# Patient Record
Sex: Male | Born: 1960 | Race: Black or African American | Hispanic: No | Marital: Single | Smoking: Current every day smoker
Health system: Southern US, Community
[De-identification: ages and names within clinical notes are randomized; demographics above are authoritative.]

## PROBLEM LIST (undated history)

## (undated) DIAGNOSIS — R531 Weakness: Secondary | ICD-10-CM

## (undated) DIAGNOSIS — G8929 Other chronic pain: Secondary | ICD-10-CM

## (undated) DIAGNOSIS — IMO0001 Reserved for inherently not codable concepts without codable children: Secondary | ICD-10-CM

## (undated) DIAGNOSIS — R51 Headache: Secondary | ICD-10-CM

## (undated) DIAGNOSIS — R42 Dizziness and giddiness: Secondary | ICD-10-CM

## (undated) DIAGNOSIS — Z86718 Personal history of other venous thrombosis and embolism: Secondary | ICD-10-CM

## (undated) DIAGNOSIS — G4730 Sleep apnea, unspecified: Secondary | ICD-10-CM

## (undated) DIAGNOSIS — F419 Anxiety disorder, unspecified: Secondary | ICD-10-CM

## (undated) DIAGNOSIS — M62838 Other muscle spasm: Secondary | ICD-10-CM

## (undated) DIAGNOSIS — F1910 Other psychoactive substance abuse, uncomplicated: Secondary | ICD-10-CM

## (undated) DIAGNOSIS — R519 Headache, unspecified: Secondary | ICD-10-CM

## (undated) DIAGNOSIS — F329 Major depressive disorder, single episode, unspecified: Secondary | ICD-10-CM

## (undated) DIAGNOSIS — F32A Depression, unspecified: Secondary | ICD-10-CM

## (undated) DIAGNOSIS — M549 Dorsalgia, unspecified: Secondary | ICD-10-CM

## (undated) DIAGNOSIS — I10 Essential (primary) hypertension: Secondary | ICD-10-CM

## (undated) DIAGNOSIS — J449 Chronic obstructive pulmonary disease, unspecified: Secondary | ICD-10-CM

## (undated) DIAGNOSIS — E785 Hyperlipidemia, unspecified: Secondary | ICD-10-CM

## (undated) DIAGNOSIS — K5900 Constipation, unspecified: Secondary | ICD-10-CM

## (undated) DIAGNOSIS — I509 Heart failure, unspecified: Secondary | ICD-10-CM

## (undated) HISTORY — DX: Anxiety disorder, unspecified: F41.9

## (undated) HISTORY — PX: OTHER SURGICAL HISTORY: SHX169

## (undated) HISTORY — DX: Other psychoactive substance abuse, uncomplicated: F19.10

## (undated) HISTORY — DX: Personal history of other venous thrombosis and embolism: Z86.718

## (undated) NOTE — *Deleted (*Deleted)
***In Progress*** PCP: [facility]   HPI:   Mr Riendeau is a 34 yo male with systolic heart failure diagnosed in 04/2012 in [place] secondary to NICM (normal cors on cath), EF 20-25%. As well as polysubstance abuse (cocaine, tobacco and alcohol). Dr. Jones Broom saw him in the HF clinic in 2016 prior to clinic visit last month. He was referred back by Judy Pimple NP for further evaluation of his HF.   He moved from [facility] and was admitted to [facility] in 4/14 with progressive dyspnea and orthopnea.  Echo on admit EF 20-25% with normal RV.  Discharge weight 147 pounds.  He also had NSVT while in house and continued his Lifevest placed in [place].  Admitted To [facility] in 8/14 with HF. EF reported 50-55% on echo. On 10/2012 bedside echo in our HF Clinic EF 35-40%. In 11/2012, presented to clinic complaining of recurrent CP and severe HF symptoms but seemed well compensated on exam. R/L cath showed normal cors.   Readmitted to [facility] 9/14-17/2021 with severe HTN and recurrent CP anfd HF. Reported he was taking his HF meds except his fluid pill. BP on arrival 163/118. Diuresed and BP controlled.   UDS + for cocaine, THC and opiates. Echo 09/18/19 EF 20-25% RV mild HK   Patient returned to clinic with Dr. Jones Broom 09/26/19 for post hospital follow up and to enroll back in HF Clinic (previously discharged due to inappropriate behavior towards staff). At appointment, he felt pretty good. Was living in a rental house by himself. SOB with mild activity. No edema, orthopnea or PND. Reported taking all meds except carvedilol because he threw it out by accident. Using cocaine 2-3 weeks. Reported drinking 12 pack every 2 days.   Today he returns to HF clinic for pharmacist medication titration. At last visit with Dr. Jones Broom on 09/26/19, carvedilol was restarted at dose of 6.25 mg BID and spironolactone 12.5 mg daily was initiated.    HF Medications: Carvedilol 6.25 mg BID  Losartan 100 mg daily  Spironolactone  12.5 mg daily  Isosorbide 60 mg daily  Furosemide 40 mg daily  Has the patient been experiencing any side effects to the medications prescribed?  {YES NO:22349}  Does the patient have any problems obtaining medications due to transportation or finances?   {YES NO:22349}NCMED  Understanding of regimen: {excellent/good/fair/poor:19665} Understanding of indications: {excellent/good/fair/poor:19665} Potential of compliance: {excellent/good/fair/poor:19665} Patient understands to avoid NSAIDs. Patient understands to avoid decongestants.    Pertinent Lab Values: . Serum creatinine 1.21, BUN 19, Potassium 4.3, Sodium 138, BNP 331.4   Vital Signs: . Weight: *** (last clinic weight: 150 lbs) . Blood pressure: ***  . Heart rate: ***   Assessment: 1) Chronic systolic HF:  - NICM, ECHO 02/2013 EF ~35-40% - NYHA II symptoms.  - Cath 11/14 normal cors - Echo 09/18/19 EF 20-25% RV mildly HK in setting of severe HTN - Volume status ok - Continue carvedilol 6.25 mg BID - Continue losartan 100 daily (no Entresto due to angiodema with ACEi) - Continue spironolactone 12.5 mg daily  2) Polysubstance abuse - Previously was attending AA meetings and group sessions weekly.  3) Current smoker - Encouraged cessation   4) HTN*** - Markedly elevated recently - Continue spironolactone 12.5 mg daily  5) OSA- Per Dr. Vassie Loll, not using CPAP. Dr. Jones Broom encouraged to use nightly.   6) Clinic behavior - Previously d/c'd from HF Clinic as several staff members did not feel comfortable in his presence. Dr. Jones Broom told patient that he  would get one more chance with a very low tolerance. If he is disrespectful to any staff members will be dismissed from clinic on the spot.    Plan: 1) Medication changes: Based on clinical presentation, vital signs and recent labs will *** 2) Labs: *** 3) Follow-up: ***   Karle Plumber, PharmD, BCPS, BCCP, CPP Heart Failure Clinic Pharmacist [phone]

## (undated) NOTE — *Deleted (*Deleted)
***In Progress***  [facility]: Dr. Gala Romney  HPI:  Charles Cervantes is a 27 yo male with systolic heart failure diagnosed in 04/2012 in [place] secondary to NICM (normal cors on cath) with EF 20-25%, as well as polysubstance abuse (cocaine, tobacco and alcohol). Dr. Gala Romney last saw him in the HF Clinic in 2016, and he was now referred back by Judy Pimple, NP for further evaluation of his HF.   He moved from [facility] and was admitted to [facility] in 04/2012 with progressive dyspnea and orthopnea.  Echo on admit with EF 20-25% with normal RV.  Discharge weight was 147 pounds.  He also had NSVT while in-house and we continued his Lifevest that was placed in [place].  Admitted To [facility] in 08/2012 with HF. EF reported 50-55% on echo. On 10/2012, bedside echo in our HF Clinic with EF 35-40%. In 11/2012, he presented to clinic with concerns of recurrent CP and severe HF symptoms but seemed well compensated on exam. R/L cath showed normal cors.   Readmitted to [facility] 09/17/19-09/20/19 with severe HTN and recurrent CP and HF. Reported he was taking his HF meds except his furosemide. BP on arrival was 163/118. Diuresed and BP controlled.   UDS + for cocaine, THC and opiates. Echo on 09/18/19 with EF 20-25%, RV mildly hypokinetic.  Most recently presented for post-hospital follow-up with Dr. Gala Romney on 09/26/19 to enroll back in HF Clinic (previously discharged due to inappropriate behavior towards staff). He stated that he felt pretty good, SOB with mild activity. Lives in a rental house by himself. Did not have edema, orthopnea or PND. York Spaniel that he is taking all medications except carvedilol because he threw it out by accident. Was using cocaine every 2-3 weeks and drinking a 12-pack every 2 days.   Today he returns to HF clinic for pharmacist medication titration. At last visit with MD, carvedilol 6.25 mg BID was resumed and spironolactone 12.5 mg daily was started.   Overall feeling ***. Dizziness, lightheadedness, fatigue:   Chest pain or palpitations.  How is your breathing?: *** SOB Able to complete all ADLs. Activity level ***  Weight at home pounds. Takes furosemide 40 mg daily.  PND/Orthopnea:   Appetite ***.  Marland Kitchen Shortness of breath/dyspnea on exertion? {YES [id number]  . Orthopnea/PND? {YES [id number] . Edema? {YES [id number] . Lightheadedness/dizziness? {YES [id number] . Daily weights at home? {YES [id number] . Blood pressure/heart rate monitoring at home? {YES [id number] . Following low-sodium/fluid-restricted diet? {YES NO:22349}  HF Medications: Carvedilol 6.25 mg BID Losartan 100 mg daily Spironolactone 12.5 mg daily Isosorbide mononitrate 60 mg daily Furosemide 40 mg daily  Has the patient been experiencing any side effects to the medications prescribed?  {YES NO:22349}  Does the patient have any problems obtaining medications due to transportation or finances?   {YES NO:22349}  Understanding of regimen: {excellent/good/fair/poor:19665} Understanding of indications: {excellent/good/fair/poor:19665} Potential of compliance: {excellent/good/fair/poor:19665} Patient understands to avoid NSAIDs. Patient understands to avoid decongestants.    Pertinent Lab Values: . Serum creatinine ***, BUN ***, Potassium ***, Sodium ***, BNP ***, Magnesium ***, Digoxin ***   Vital Signs: . Weight: *** (last clinic weight: 68.1 kg) . Blood pressure: ***  . Heart rate: ***   Assessment: 1) Chronic systolic HF: NICM, ECHO on 02/2013 with EF ~35-40%  - NYHA II symptoms, volume status ok - Continue furosemide 40 mg daily - Continue*** carvedilol 6.25*** mg BID  - Continue losartan 100 daily (no Entresto due to angiodema with ACEi) - Continue*** spironolactone 12.5*** mg daily -  Start hydralazine 25 mg TID*** - Stressed need for compliance with medications and abstinence from drugs and ETOH  2) Polysubstance abuse: Previously was attending AA meetings and group sessions weekly. - Stressed need for  compliance with meds and abstinence from drugs and ETOH  3) Current smoker - Encouraged cessation   4) HTN - Markedly elevated recently - Continue*** spironolactone 12.5*** mg daily  5) OSA- Per DrAlva. Not using CPAP. Encouraged to use nightly.   6) Clinic behavior - Previously d/c'd from HF Clinic as several staff members did not feel comfortable in his presence. Previously discussed with him that he would get one more chance with a very low tolerance. If he is disrespectful to any staff members, he will be dismissed from clinic on the spot.    Plan: 1) Medication changes: Based on clinical presentation, vital signs and recent labs will *** 2) Labs: *** 3) Follow-up: ***   Karle Plumber, PharmD, BCPS, BCCP, CPP Heart Failure Clinic Pharmacist [phone]

---

## 2010-01-03 DIAGNOSIS — Z86718 Personal history of other venous thrombosis and embolism: Secondary | ICD-10-CM

## 2010-01-03 HISTORY — DX: Personal history of other venous thrombosis and embolism: Z86.718

## 2012-05-01 ENCOUNTER — Inpatient Hospital Stay (HOSPITAL_COMMUNITY)
Admission: EM | Admit: 2012-05-01 | Discharge: 2012-05-03 | DRG: 293 | Disposition: A | Discharge status: Home / Self Care | Payer: Medicaid Other | Attending: Family Medicine | Admitting: Family Medicine

## 2012-05-01 ENCOUNTER — Emergency Department (HOSPITAL_COMMUNITY): Payer: Medicaid Other

## 2012-05-01 ENCOUNTER — Encounter (HOSPITAL_COMMUNITY): Payer: Self-pay | Admitting: Emergency Medicine

## 2012-05-01 DIAGNOSIS — Z9119 Patient's noncompliance with other medical treatment and regimen: Secondary | ICD-10-CM

## 2012-05-01 DIAGNOSIS — F1410 Cocaine abuse, uncomplicated: Secondary | ICD-10-CM | POA: Diagnosis present

## 2012-05-01 DIAGNOSIS — F17200 Nicotine dependence, unspecified, uncomplicated: Secondary | ICD-10-CM | POA: Diagnosis present

## 2012-05-01 DIAGNOSIS — I509 Heart failure, unspecified: Secondary | ICD-10-CM | POA: Diagnosis present

## 2012-05-01 DIAGNOSIS — Z91199 Patient's noncompliance with other medical treatment and regimen due to unspecified reason: Secondary | ICD-10-CM

## 2012-05-01 DIAGNOSIS — I5021 Acute systolic (congestive) heart failure: Principal | ICD-10-CM | POA: Diagnosis present

## 2012-05-01 DIAGNOSIS — I10 Essential (primary) hypertension: Secondary | ICD-10-CM | POA: Diagnosis present

## 2012-05-01 DIAGNOSIS — F1010 Alcohol abuse, uncomplicated: Secondary | ICD-10-CM | POA: Diagnosis present

## 2012-05-01 DIAGNOSIS — E785 Hyperlipidemia, unspecified: Secondary | ICD-10-CM | POA: Diagnosis present

## 2012-05-01 HISTORY — DX: Essential (primary) hypertension: I10

## 2012-05-01 HISTORY — DX: Hyperlipidemia, unspecified: E78.5

## 2012-05-01 LAB — CBC WITH DIFFERENTIAL/PLATELET
Lymphocytes Relative: 17 % (ref 12–46)
Lymphs Abs: 1.2 10*3/uL (ref 0.7–4.0)
MCV: 95.3 fL (ref 78.0–100.0)
Neutro Abs: 4.4 10*3/uL (ref 1.7–7.7)
Neutrophils Relative %: 64 % (ref 43–77)
Platelets: 194 10*3/uL (ref 150–400)
RBC: 4.26 MIL/uL (ref 4.22–5.81)
WBC: 6.9 10*3/uL (ref 4.0–10.5)

## 2012-05-01 LAB — TROPONIN I: Troponin I: <0.3 ng/mL (ref <0.30)

## 2012-05-01 LAB — COMPREHENSIVE METABOLIC PANEL
ALT: 45 U/L (ref 0–53)
AST: 31 U/L (ref 0–37)
Alkaline Phosphatase: 46 U/L (ref 39–117)
CO2: 25 mEq/L (ref 19–32)
Chloride: 102 mEq/L (ref 96–112)
GFR calc Af Amer: >90 mL/min (ref >90)
GFR calc non Af Amer: >90 mL/min (ref >90)
Glucose, Bld: 106 mg/dL — ABNORMAL HIGH (ref 70–99)
Potassium: 4.2 mEq/L (ref 3.5–5.1)
Sodium: 135 mEq/L (ref 135–145)

## 2012-05-01 LAB — RAPID URINE DRUG SCREEN, HOSP PERFORMED: Barbiturates: NOT DETECTED

## 2012-05-01 LAB — POCT I-STAT TROPONIN I

## 2012-05-01 LAB — PRO B NATRIURETIC PEPTIDE: Pro B Natriuretic peptide (BNP): 2656 pg/mL — ABNORMAL HIGH (ref 0–125)

## 2012-05-01 MED ORDER — SODIUM CHLORIDE 0.9 % IJ SOLN
3.0000 mL | Freq: Two times a day (BID) | INTRAMUSCULAR | Status: DC
  Administered 2012-05-01 – 2012-05-03 (×4): 3 mL via INTRAVENOUS

## 2012-05-01 MED ORDER — MENTHOL 3 MG MT LOZG
1.0000 | LOZENGE | OROMUCOSAL | Status: DC | PRN
  Administered 2012-05-01 – 2012-05-03 (×4): 3 mg via ORAL
  Filled 2012-05-01 (×2): qty 9

## 2012-05-01 MED ORDER — ACETAMINOPHEN 325 MG PO TABS: 650.0000 mg | ORAL_TABLET | ORAL | Status: DC | PRN

## 2012-05-01 MED ORDER — FUROSEMIDE 10 MG/ML IJ SOLN
40.0000 mg | Freq: Two times a day (BID) | INTRAMUSCULAR | Status: DC
  Administered 2012-05-01 – 2012-05-03 (×4): 40 mg via INTRAVENOUS
  Filled 2012-05-01 (×6): qty 4

## 2012-05-01 MED ORDER — VITAMIN B-1 100 MG PO TABS
100.0000 mg | ORAL_TABLET | Freq: Every day | ORAL | Status: DC
  Administered 2012-05-02 – 2012-05-03 (×2): 100 mg via ORAL
  Filled 2012-05-01 (×2): qty 1

## 2012-05-01 MED ORDER — LORAZEPAM 2 MG/ML IJ SOLN: 1.0000 mg | Freq: Four times a day (QID) | INTRAMUSCULAR | Status: DC | PRN

## 2012-05-01 MED ORDER — LORAZEPAM 2 MG/ML IJ SOLN
1.0000 mg | Freq: Once | INTRAMUSCULAR | Status: AC
  Administered 2012-05-01: 1 mg via INTRAVENOUS
  Filled 2012-05-01: qty 1

## 2012-05-01 MED ORDER — ACETAMINOPHEN 325 MG PO TABS
650.0000 mg | ORAL_TABLET | ORAL | Status: DC | PRN
  Administered 2012-05-01 – 2012-05-03 (×6): 650 mg via ORAL
  Filled 2012-05-01 (×6): qty 2

## 2012-05-01 MED ORDER — ZOLPIDEM TARTRATE 5 MG PO TABS: 5.0000 mg | ORAL_TABLET | Freq: Every evening | ORAL | Status: DC | PRN

## 2012-05-01 MED ORDER — LORAZEPAM 0.5 MG PO TABS
1.0000 mg | ORAL_TABLET | Freq: Four times a day (QID) | ORAL | Status: DC | PRN
  Administered 2012-05-02: 1 mg via ORAL
  Filled 2012-05-01: qty 2

## 2012-05-01 MED ORDER — SODIUM CHLORIDE 0.9 % IV SOLN: 250.0000 mL | INTRAVENOUS | Status: DC | PRN

## 2012-05-01 MED ORDER — LORAZEPAM 0.5 MG PO TABS
0.0000 mg | ORAL_TABLET | Freq: Four times a day (QID) | ORAL | Status: DC
  Administered 2012-05-01 – 2012-05-02 (×2): 2 mg via ORAL
  Filled 2012-05-01 (×2): qty 4

## 2012-05-01 MED ORDER — LISINOPRIL 5 MG PO TABS
5.0000 mg | ORAL_TABLET | Freq: Every day | ORAL | Status: DC
  Administered 2012-05-01: 5 mg via ORAL
  Filled 2012-05-01 (×2): qty 1

## 2012-05-01 MED ORDER — ADULT MULTIVITAMIN W/MINERALS CH
1.0000 | ORAL_TABLET | Freq: Every day | ORAL | Status: DC
  Administered 2012-05-02 – 2012-05-03 (×2): 1 via ORAL
  Filled 2012-05-01 (×2): qty 1

## 2012-05-01 MED ORDER — ATORVASTATIN CALCIUM 10 MG PO TABS
10.0000 mg | ORAL_TABLET | Freq: Every day | ORAL | Status: DC
  Administered 2012-05-01 – 2012-05-02 (×2): 10 mg via ORAL
  Filled 2012-05-01 (×5): qty 1

## 2012-05-01 MED ORDER — FUROSEMIDE 10 MG/ML IJ SOLN
40.0000 mg | Freq: Once | INTRAMUSCULAR | Status: AC
  Administered 2012-05-01: 40 mg via INTRAVENOUS
  Filled 2012-05-01: qty 4

## 2012-05-01 MED ORDER — THIAMINE HCL 100 MG/ML IJ SOLN
100.0000 mg | Freq: Every day | INTRAMUSCULAR | Status: DC
  Filled 2012-05-01 (×2): qty 1

## 2012-05-01 MED ORDER — FOLIC ACID 1 MG PO TABS
1.0000 mg | ORAL_TABLET | Freq: Every day | ORAL | Status: DC
  Administered 2012-05-02 – 2012-05-03 (×2): 1 mg via ORAL
  Filled 2012-05-01 (×2): qty 1

## 2012-05-01 MED ORDER — ASPIRIN EC 81 MG PO TBEC
81.0000 mg | DELAYED_RELEASE_TABLET | Freq: Every day | ORAL | Status: DC
  Administered 2012-05-01 – 2012-05-03 (×3): 81 mg via ORAL
  Filled 2012-05-01 (×3): qty 1

## 2012-05-01 MED ORDER — SODIUM CHLORIDE 0.9 % IV SOLN: INTRAVENOUS | Status: AC

## 2012-05-01 MED ORDER — LORAZEPAM 0.5 MG PO TABS: 0.0000 mg | ORAL_TABLET | Freq: Two times a day (BID) | ORAL | Status: DC

## 2012-05-01 MED ORDER — HEPARIN SODIUM (PORCINE) 5000 UNIT/ML IJ SOLN
5000.0000 [IU] | Freq: Three times a day (TID) | INTRAMUSCULAR | Status: DC
  Administered 2012-05-01 – 2012-05-03 (×6): 5000 [IU] via SUBCUTANEOUS
  Filled 2012-05-01 (×10): qty 1

## 2012-05-01 MED ORDER — SODIUM CHLORIDE 0.9 % IJ SOLN
3.0000 mL | INTRAMUSCULAR | Status: DC | PRN
  Administered 2012-05-01: 3 mL via INTRAVENOUS

## 2012-05-01 MED ORDER — ONDANSETRON HCL 4 MG/2ML IJ SOLN: 4.0000 mg | Freq: Four times a day (QID) | INTRAMUSCULAR | Status: DC | PRN

## 2012-05-01 MED ORDER — FUROSEMIDE 10 MG/ML IJ SOLN
40.0000 mg | Freq: Every day | INTRAMUSCULAR | Status: DC
  Administered 2012-05-01: 40 mg via INTRAVENOUS
  Filled 2012-05-01: qty 4

## 2012-05-01 MED ORDER — NICOTINE 21 MG/24HR TD PT24
21.0000 mg | MEDICATED_PATCH | Freq: Every day | TRANSDERMAL | Status: DC
  Administered 2012-05-01 – 2012-05-03 (×3): 21 mg via TRANSDERMAL
  Filled 2012-05-01 (×3): qty 1

## 2012-05-01 NOTE — ED Notes (Signed)
Report called Cephus Slater RN

## 2012-05-01 NOTE — ED Notes (Signed)
[id number]<AV> Expected date:<BR> Expected time:<BR> Means of arrival:<BR> Comments:<BR> triage

## 2012-05-01 NOTE — ED Notes (Signed)
Pt presenting to ed with c/o chest pain with shortness of breath x 1 week pt states he was discharge for the same thing x 1 week ago and his symptoms continue pt denies nausea and vomiting at this time

## 2012-05-01 NOTE — H&P (Addendum)
[facility] History and Physical  Charles Cervantes AVW:[id number] DOB: [birth year] DOA: 05/01/2012  Referring physician: Dr. Adriana Cervantes PCP: No primary provider on file.  Specialists: heart failure team  Chief Complaint: shortness of breath  HPI: Charles Cervantes is a 52 y.o. male without known past medical history, who presented to the ED with a chief complaint of shortness of breath and chest pain worse with exertion for the past week. He has a history of heavy alcohol and drug abusing, including cocaine. Last week he was visiting a friend in [place] when he had chest pain and breathing difficulties. He was hospitalized at a local hospital, and he was told he has "weak heart". He underwent cardiac catheterization - per patient report - last Wednesday, and the he was explained that they are looking for blockage that they could fix. After catheterization patient was told that there was no blockage and his heart problems are likely due to to his alcohol or drug use. His ejection fraction is unknown at this time, however patient is wearing a life vest in the ED. He was told that if he gets palpitations he might get a shock. Patient denies any shocks. He came back to [place] about 3 days ago, and since then, his symptoms have been slowly getting worse. Has noticed his legs being swollen, his cough got worse, and he has sputum production with clear frothy aspect. He is unable to lay flat and has been sitting in the chair recliner for the past 3 days. He thinks he gained some weight, however he does not weigh himself. He denies any nausea vomiting or diarrhea. He denies any lightheadedness or dizziness. He denies any fevers or chills. In [place], he was started on Augmentin, but he's not sure why he was started on that medication. He thinks it is for bronchitis. On discharge, he was told to come back to [place] and establish a local cardiologist because his heart condition is serious. He currently has symptoms  at rest. In the emergency room, he received 40 mg of IV Lasix. He told me he quit drinking and quit his other drugs when he was admitted to [facility].  Review of Systems:  Constitutional: Negative for malaise, fever and chills. No significant weight loss or weight gain  Eyes: Negative for eye pain, redness and discharge, diplopia, visual changes, or flashes of light.  HENT: Negative for ear pain, hoarseness, nasal congestion, sinus pressure and sore throat. No headaches; tinnitus, drooling, or problem swallowing.  Cardiovascular: As per history of present illness Respiratory: As per history of present illness Gastrointestinal: Negative for nausea, diarrhea, constipation, abdominal pain, melena, blood in stool, hematemesis, jaundice and rectal bleeding.  Genitourinary: Negative for frequency, dysuria, incontinence, flank pain and hematuria  Musculoskeletal: Negative for back pain and neck pain. 2+ pitting edema right lower extremity more than left lower extremity. Skin: . Negative for pruritus, rash, abrasions, bruising and skin lesion. ulcerations  Neuro: Negative for headache, and neck stiffness. Negative for weakness, altered level of consciousness , altered mental status, extremity weakness, seizure and syncope.  Psych: negative for anxiety or depression  Past Medical History  Diagnosis Date  . Hypertension   . Hyperlipidemia    Past Surgical History  Procedure Laterality Date  . Left second finger surgery     Social History:  reports that he has been smoking Cigarettes.  He has been smoking about 0.50 packs per day. He has never used smokeless tobacco. He reports that  drinks alcohol.  He reports that he uses illicit drugs (Cocaine).  No Known Allergies  History reviewed. No pertinent family history.   Prior to Admission medications   Medication Sig Start Date End Date Taking? Authorizing Provider  amoxicillin-clavulanate (AUGMENTIN) 875-125 MG per tablet Take 1 tablet by  mouth 2 (two) times daily. For 7 days. Started on 04-28-12   Yes Historical Provider, MD  lisinopril (PRINIVIL,ZESTRIL) 5 MG tablet Take 5 mg by mouth daily.   Yes Historical Provider, MD  PRESCRIPTION MEDICATION Pt was on furosemide injection in the hospital. Pt states he was supposed to continue this medication after discharge, however pt could not get this filled and has contacted the md to change this to by mouth form   Yes Historical Provider, MD  spironolactone (ALDACTONE) 25 MG tablet Take 25 mg by mouth daily.   Yes Historical Provider, MD   Physical Exam: Filed Vitals:   05/01/12 0834  BP: 125/97  Pulse: 74  Temp: 97.9 F (36.6 C)  TempSrc: Oral  Resp: 20  SpO2: 100%     General:  No apparent distress, sitting at the edge of the bed   Eyes: PERRL, EOMI, no scleral icterus  ENT: moist oropharynx  Neck: supple, JVD up to mandible  Cardiovascular: regular rate without MRG; 2+ peripheral pulses  Respiratory: good air movement bilaterally, crackled present to mid fields. No wheezing.  Abdomen: soft, non tender to palpation, positive bowel sounds, no guarding, no rebound  Skin: no rashes  Musculoskeletal: 2+ pittint peripheral edema, right greater than left  Psychiatric: normal mood and affect  Neurologic: CN 2-12 grossly intact, MS 5/5 in all 4  Labs on Admission:  Basic Metabolic Panel:  Recent Labs Lab 05/01/12 0934  NA 135  K 4.2  CL 102  CO2 25  GLUCOSE 106*  BUN 11  CREATININE 0.85  CALCIUM 8.5   Liver Function Tests:  Recent Labs Lab 05/01/12 0934  AST 31  ALT 45  ALKPHOS 46  BILITOT 0.3  PROT 5.8*  ALBUMIN 3.1*   CBC:  Recent Labs Lab 05/01/12 0934  WBC 6.9  NEUTROABS 4.4  HGB 13.4  HCT 40.6  MCV 95.3  PLT 194   BNP (last 3 results)  Recent Labs  05/01/12 0935  PROBNP 2656.0*   Radiological Exams on Admission: Dg Chest 2 View  05/01/2012  *RADIOLOGY REPORT*  Clinical Data: Chest pain, hypertension.  CHEST - 2 VIEW   Comparison: None  Findings: There is hyperinflation of the lungs compatible with COPD.  Cardiomegaly.  Diffuse bilateral interstitial opacities. Suspect trace left pleural effusion.  No acute bony abnormality.  IMPRESSION: COPD, cardiomegaly.  Suspect mild interstitial edema/CHF.   Original Report Authenticated By: Charlett Nose, M.D.     EKG: Independently reviewed. Sinus rhythm.  Assessment/Plan Active Problems:   Alcohol abuse   Cocaine abuse   Acute systolic congestive heart failure, NYHA class 4   Acute systolic congestive heart failure - He has symptoms at rest, and in the emergency room patient states at the age of the bed unable to lay on his back for examination with JVD, LE edema and crackles. Not requiring O2.  - His ejection fraction is unknown at this time, however he is wearing a light test which suggests a poor ejection fraction and high risk for arrhythmias - I consulted the heart failure team, and I think he can patient's best interest to be transferred to [facility] to be evaluated for heart failure team - renal function normal -  2D echo - continue ACEI, Lasix iv  Alcohol abuse - Patient's last treatment was more than a week ago, and he reports abstinence since  Cocaine abuse - Patient also reports that he stopped drugs last week prior to [facility] hospitalization - UDS here pending  DVT prophylaxis - heparin s q   Code Status: Full  Family Communication: none  Disposition Plan: inpatient  Time spent: 35  Costin M. Elvera Lennox, MD [facility] Pager [phone]  If 7PM-7AM, please contact night-coverage [email or web address] Password [facility] 05/01/2012, 2:03 PM

## 2012-05-01 NOTE — Consult Note (Signed)
Advanced Heart Failure Team Consult Note  Referring Physician: Dr. Elvera Lennox with [facility] Primary Physician: New to the area Primary Cardiologist: New to the area  Reason for Consultation: New onset systolic HF and LifeVest  HPI:    [name] is a 52 y.o. AAM who has was living in [place] until this weekend when he came to [facility] for his cousin's funeral.  He says he is going to relocate here for at least the next 6 months.  He has a new diagnosis of systolic HF as of last week from an [facility].  He also has polysubstance abuse that includes tobacco, cocaine approx 2x week and alcohol with 8-12 beers a day.  He presented to the [facility] with c/o shortness of breath and swelling.  He said he was diagnosed with a weak heart.  He underwent cath which he says was normal.  He was then discharged with a Lifevest and told to follow up with cardiology upon arrival to [facility].  He was given prescription for lasix IV (? Error), lisinopril, spiro and augmentin.  He was unable to pick up the lasix IV because it was $200.  He has been taking his spiro, lasix and augementin.    He has only been in [place] over the weekend but went to a cookout with bar-b-que and a couple of beers/cigarettes.  He said he was told to have low sodium diet.  He started feeling poorly again yesterday.  Symptoms consisted of cough, shortness of breath, and orthopnea.  He says he was unable to sleep last night and therefore went to the ER.  Pertinent labs include: proBNP 2656, Cr 0.85, BUN 11, troponin normal x2.  UDS negative. Echo today at [facility] showed EF 20-25% and normal RV per review of Dr. Gala Romney.    Review of Systems: [y] = yes, [ ]  = no   General: Weight gain [ ] ; Weight loss [ ] ; Anorexia [ ] ; Fatigue [ ] ; Fever [ ] ; Chills [ ] ; Weakness [ ]   Cardiac: Chest pain/pressure [ ] ; Resting SOB [ ] ; Exertional SOB [ y]; Orthopnea Cove.Etienne ]; Pedal Edema Cove.Etienne ]; Palpitations [ ] ; Syncope [ ] ; Presyncope [ ] ; Paroxysmal nocturnal  dyspnea[ ]   Pulmonary: Cough Cove.Etienne ]; Wheezing[ ] ; Hemoptysis[ ] ; Sputum [ ] ; Snoring [ ]   GI: Vomiting[ ] ; Dysphagia[ ] ; Melena[ ] ; Hematochezia [ ] ; Heartburn[ ] ; Abdominal pain [ ] ; Constipation [ ] ; Diarrhea [ ] ; BRBPR [ ]   GU: Hematuria[ ] ; Dysuria [ ] ; Nocturia[ ]   Vascular: Pain in legs with walking [ ] ; Pain in feet with lying flat [ ] ; Non-healing sores [ ] ; Stroke [ ] ; TIA [ ] ; Slurred speech [ ] ;  Neuro: Headaches[ ] ; Vertigo[ ] ; Seizures[ ] ; Paresthesias[ ] ;Blurred vision [ ] ; Diplopia [ ] ; Vision changes [ ]   Ortho/Skin: Arthritis [ ] ; Joint pain [ ] ; Muscle pain [ ] ; Joint swelling [ ] ; Back Pain [ ] ; Rash [ ]   Psych: Depression[ ] ; Anxiety[ ]   Heme: Bleeding problems [ ] ; Clotting disorders [ ] ; Anemia [ ]   Endocrine: Diabetes [ ] ; Thyroid dysfunction[ ]   Home Medications Prior to Admission medications   Medication Sig Start Date End Date Taking? Authorizing Provider  amoxicillin-clavulanate (AUGMENTIN) 875-125 MG per tablet Take 1 tablet by mouth 2 (two) times daily. For 7 days. Started on 04-28-12   Yes Historical Provider, MD  lisinopril (PRINIVIL,ZESTRIL) 5 MG tablet Take 5 mg by mouth daily.   Yes Historical Provider, MD  PRESCRIPTION  MEDICATION Pt was on furosemide injection in the hospital. Pt states he was supposed to continue this medication after discharge, however pt could not get this filled and has contacted the md to change this to by mouth form   Yes Historical Provider, MD  spironolactone (ALDACTONE) 25 MG tablet Take 25 mg by mouth daily.   Yes Historical Provider, MD    Past Medical History: Past Medical History  Diagnosis Date  . Hypertension   . Hyperlipidemia     Past Surgical History: Past Surgical History  Procedure Laterality Date  . Left second finger surgery      Family History: History reviewed. No pertinent family history.  Social History: History   Social History  . Marital Status: Single    Spouse Name: N/A    Number of Children: N/A   . Years of Education: N/A   Social History Main Topics  . Smoking status: Current Every Day Smoker -- 0.50 packs/day    Types: Cigarettes  . Smokeless tobacco: Never Used  . Alcohol Use: Yes     Comment: daily  . Drug Use: Yes    Special: Cocaine  . Sexually Active: No   Other Topics Concern  . None   Social History Narrative  . None    Allergies:  No Known Allergies  Objective:    Vital Signs:   Temp:  [97.9 F (36.6 C)-98.2 F (36.8 C)] 98.2 F (36.8 C) (04/29 1700) Pulse Rate:  [74-82] 82 (04/29 1700) Resp:  [19-20] 19 (04/29 1700) BP: (120-125)/(81-97) 120/81 mmHg (04/29 1700) SpO2:  [100 %] 100 % (04/29 1700) Weight:  [162 lb 9.6 oz (73.755 kg)] 162 lb 9.6 oz (73.755 kg) (04/29 1700) Last BM Date: 04/30/12  Weight change: Filed Weights   05/01/12 1700  Weight: 162 lb 9.6 oz (73.755 kg)    Intake/Output:  No intake or output data in the 24 hours ending 05/01/12 1858   Physical Exam: General:  Well appearing. No resp difficulty HEENT: normal except for poor dentition Neck: supple. JVP 9-10. Carotids 2+ bilat; no bruits. No lymphadenopathy or thryomegaly appreciated. Cor: PMI nondisplaced. Regular rate & rhythm. No rubs, gallops or murmurs. Lungs: clear Abdomen: soft, nontender, nondistended. No hepatosplenomegaly. No bruits or masses. Good bowel sounds. Extremities: no cyanosis, clubbing, rash, 1+ left ankle and tr right ankle edema Neuro: alert & orientedx3, cranial nerves grossly intact. moves all 4 extremities w/o difficulty. Affect pleasant  Telemetry: NSR  Labs: Basic Metabolic Panel:  Recent Labs Lab 05/01/12 0934  NA 135  K 4.2  CL 102  CO2 25  GLUCOSE 106*  BUN 11  CREATININE 0.85  CALCIUM 8.5    Liver Function Tests:  Recent Labs Lab 05/01/12 0934  AST 31  ALT 45  ALKPHOS 46  BILITOT 0.3  PROT 5.8*  ALBUMIN 3.1*   No results found for this basename: LIPASE, AMYLASE,  in the last 168 hours No results found for this  basename: AMMONIA,  in the last 168 hours  CBC:  Recent Labs Lab 05/01/12 0934  WBC 6.9  NEUTROABS 4.4  HGB 13.4  HCT 40.6  MCV 95.3  PLT 194    Cardiac Enzymes:  Recent Labs Lab 05/01/12 1502  TROPONINI <0.30    BNP: BNP (last 3 results)  Recent Labs  05/01/12 0935  PROBNP 2656.0*      Imaging: Dg Chest 2 View  05/01/2012  *RADIOLOGY REPORT*  Clinical Data: Chest pain, hypertension.  CHEST - 2 VIEW  Comparison: None  Findings: There is hyperinflation of the lungs compatible with COPD.  Cardiomegaly.  Diffuse bilateral interstitial opacities. Suspect trace left pleural effusion.  No acute bony abnormality.  IMPRESSION: COPD, cardiomegaly.  Suspect mild interstitial edema/CHF.   Original Report Authenticated By: Charlett Nose, M.D.       Medications:     Current Medications: . sodium chloride   Intravenous STAT  . aspirin EC  81 mg Oral Daily  . atorvastatin  10 mg Oral q1800  . furosemide  40 mg Intravenous Daily  . heparin  5,000 Units Subcutaneous Q8H  . lisinopril  5 mg Oral Daily  . nicotine  21 mg Transdermal Daily  . sodium chloride  3 mL Intravenous Q12H     Infusions:      Assessment:   1. Acute on chronic systolic HF     -Lifevest from recent discharge in [place] 2. NICM, EF 20-25% 3. Polysubstance abuse 4. Noncompliance 2/2 financial issues  Plan/Discussion:    Patient presents with mild fluid overload in the setting of not having diuretics due to financial constraints.  Will increase lasix to BID.  Continue lisinopril and spironolactone.  Can add low dose beta blocker in the next 24-48 hours.    Will need close follow up in the HF clinic.  Have discussed importance of medication compliance and follow up appointments.  He said he will comply.  Will get social work involved to obtain medications on discharge.   Will watch monitor to telemetry as well as obtain hospital records from [place] to see if Lifevest needs to be continued.      Length of Stay: 0  Robbi Garter, [facility] 05/01/2012, 6:58 PM  Patient seen and examined with Ulyess Blossom, PA-C. Echo images reviewed personally. We discussed all aspects of the encounter. I agree with the assessment and plan as stated above.   Agree with diuresis. Long discussion about need for medication compliance. He has numerous social challenges but hopefully we can make a difference with his HF care. We will follow. Titrate HF meds as tolerated in house. With EF 20-25% will continue LifeVest.  Daniel Bensimhon,MD 8:00 PM

## 2012-05-01 NOTE — Progress Notes (Signed)
Echocardiogram 2D Echocardiogram has been performed.  Tritia Endo 05/01/2012, 3:28 PM

## 2012-05-01 NOTE — ED Provider Notes (Signed)
History     CSN: [id number]  Arrival date & time 05/01/12  0808   First MD Initiated Contact with Patient 05/01/12 [phone]      Chief Complaint  Patient presents with  . Chest Pain    (Consider location/radiation/quality/duration/timing/severity/associated sxs/prior treatment) HPI... chest pain and shortness of breath for one week getting worse. Patient was admitted to a hospital in [place] on 04/27/2012 and diagnosed with a "weak heart".  Has history of alcohol use, crack cocaine usage, cigarettes. Exertion makes symptoms worse. Rest makes symptoms better. Also history of hypertension lipids.  Severity is moderate. No radiation   Past Medical History  Diagnosis Date  . Hypertension   . Hyperlipidemia     History reviewed. No pertinent past surgical history.  No family history on file.  History  Substance Use Topics  . Smoking status: Current Every Day Smoker -- 0.50 packs/day    Types: Cigarettes  . Smokeless tobacco: Not on file  . Alcohol Use: Yes     Comment: daily      Review of Systems  All other systems reviewed and are negative.    Allergies  Review of patient's allergies indicates no known allergies.  Home Medications   Current Outpatient Rx  Name  Route  Sig  Dispense  Refill  . amoxicillin-clavulanate (AUGMENTIN) 875-125 MG per tablet   Oral   Take 1 tablet by mouth 2 (two) times daily. For 7 days. Started on 04-28-12         . lisinopril (PRINIVIL,ZESTRIL) 5 MG tablet   Oral   Take 5 mg by mouth daily.         Marland Kitchen PRESCRIPTION MEDICATION      Pt was on furosemide injection in the hospital. Pt states he was supposed to continue this medication after discharge, however pt could not get this filled and has contacted the md to change this to by mouth form         . spironolactone (ALDACTONE) 25 MG tablet   Oral   Take 25 mg by mouth daily.           BP 125/97  Pulse 74  Temp(Src) 97.9 F (36.6 C) (Oral)  Resp 20  SpO2  100%  Physical Exam  Nursing note and vitals reviewed. Constitutional: He is oriented to person, place, and time. He appears well-developed and well-nourished.  HENT:  Head: Normocephalic and atraumatic.  Eyes: Conjunctivae and EOM are normal. Pupils are equal, round, and reactive to light.  Neck: Normal range of motion. Neck supple.  Cardiovascular: Normal rate, regular rhythm and normal heart sounds.   Pulmonary/Chest: Effort normal.  No obvious rales  Abdominal: Soft. Bowel sounds are normal.  Musculoskeletal: Normal range of motion.  Neurological: He is alert and oriented to person, place, and time.  Skin: Skin is warm and dry.  Psychiatric: He has a normal mood and affect.    ED Course  Procedures (including critical care time)  Labs Reviewed  PRO B NATRIURETIC PEPTIDE - Abnormal; Notable for the following:    Pro B Natriuretic peptide (BNP) 2656.0 (*)    All other components within normal limits  CBC WITH DIFFERENTIAL - Abnormal; Notable for the following:    Monocytes Relative 13 (*)    Eosinophils Relative 6 (*)    All other components within normal limits  COMPREHENSIVE METABOLIC PANEL - Abnormal; Notable for the following:    Glucose, Bld 106 (*)    Total Protein 5.8 (*)  Albumin 3.1 (*)    All other components within normal limits  URINE RAPID DRUG SCREEN (HOSP PERFORMED)  POCT I-STAT TROPONIN I    Dg Chest 2 View  05/01/2012  *RADIOLOGY REPORT*  Clinical Data: Chest pain, hypertension.  CHEST - 2 VIEW  Comparison: None  Findings: There is hyperinflation of the lungs compatible with COPD.  Cardiomegaly.  Diffuse bilateral interstitial opacities. Suspect trace left pleural effusion.  No acute bony abnormality.  IMPRESSION: COPD, cardiomegaly.  Suspect mild interstitial edema/CHF.   Original Report Authenticated By: Charlett Nose, M.D.    No results found.   No diagnosis found.   Date: 05/01/2012  Rate: 79  Rhythm: normal sinus rhythm  QRS Axis: normal   Intervals: normal  ST/T Wave abnormalities: normal  Conduction Disutrbances: none  Narrative Interpretation: unremarkable  LVH   MDM  History and physical suggest mild congestive heart failure.  EKG normal. Troponin negative.  BNP 2600.  IV Lasix. Admit to general medicine.  Patient is hemodynamically stable        Donnetta Hutching, MD 05/01/12 1330

## 2012-05-01 NOTE — Progress Notes (Signed)
During [facility] ED 04/30/12 visit CM spoke with pt who confirms he will be staying in [facility] (coming from [place] to [place] here for a funeral) and is self pay [employer] with no pcp. CM discussed and provided written information for self pay pcps, importance of pcp for f/u care, www.needymeds.org, discounted pharmacies and other [place] resources such as financial assistance, DSS and  health department  Reviewed resources for [employer] self pay pcps like [facility], family medicine at [address], [facility] family practice, general medical clinics, [facility] urgent care plus others, [facility] out patient pharmacies and housing Pt voiced understanding and appreciation of resources provided   CM spoke with the pt about [facility] MATCH program Pt heard minimal of what Cm was discussing Pt noted to be speaking fast.  Pt impulsively removed himself from his bed and moved to EMS stretcher. Pt reports unable to afford a $200 co pay medication  CM encouraged pt to speak with CM at [facility] about need for medication to confirm if he is a candidate for MATCH  Pt transferred to [facility] via ems

## 2012-05-01 NOTE — Progress Notes (Signed)
Pt requesting something for sore throat.  Dr. Lavera Guise made aware and order entered in the computer.  Amanda Pea, Charity fundraiser.

## 2012-05-02 DIAGNOSIS — F1410 Cocaine abuse, uncomplicated: Secondary | ICD-10-CM

## 2012-05-02 LAB — BASIC METABOLIC PANEL
BUN: 15 mg/dL (ref 6–23)
Chloride: 95 mEq/L — ABNORMAL LOW (ref 96–112)
Creatinine, Ser: 1.08 mg/dL (ref 0.50–1.35)
GFR calc Af Amer: >90 mL/min (ref >90)

## 2012-05-02 MED ORDER — LISINOPRIL 10 MG PO TABS
10.0000 mg | ORAL_TABLET | Freq: Every day | ORAL | Status: DC
  Administered 2012-05-02 – 2012-05-03 (×2): 10 mg via ORAL
  Filled 2012-05-02 (×2): qty 1

## 2012-05-02 MED ORDER — HYDROCORTISONE ACE-PRAMOXINE 1-1 % RE FOAM
1.0000 | Freq: Two times a day (BID) | RECTAL | Status: DC
  Administered 2012-05-02 – 2012-05-03 (×3): 1 via RECTAL
  Filled 2012-05-02 (×3): qty 10

## 2012-05-02 MED ORDER — CARVEDILOL 3.125 MG PO TABS
3.1250 mg | ORAL_TABLET | Freq: Two times a day (BID) | ORAL | Status: DC
  Administered 2012-05-02 – 2012-05-03 (×2): 3.125 mg via ORAL
  Filled 2012-05-02 (×6): qty 1

## 2012-05-02 MED ORDER — POTASSIUM CHLORIDE CRYS ER 20 MEQ PO TBCR
40.0000 meq | EXTENDED_RELEASE_TABLET | Freq: Once | ORAL | Status: AC
  Administered 2012-05-02: 40 meq via ORAL
  Filled 2012-05-02: qty 2

## 2012-05-02 NOTE — Progress Notes (Signed)
Advanced Heart Failure Rounding Note   Subjective:    52 yo AAM with recent diagnosis of systolic heart failure secondary to NICM, EF 20-25%.  As well as polysubstance abuse (cocaine, tobacco and alcohol).  Admitted 4/29 with progressive dyspnea and orthopnea due to inability to afford diuretics.  Started on IV diuresis 40 mg BID. Pertinent labs include: proBNP 2656, Cr 0.85, BUN 11, troponin normal x2. UDS negative. Echo today at [facility] showed EF 20-25% and normal RV per review of Dr. Gala Romney.  UOP not measured.  Weight down 5 pounds overnight.  Slept ok last night.  +cough.  Says he may be having hemorrhoid pain.  Patient said he took his own spironolactone last night.  +leg cramps.   Objective:     Vital Signs:   Temp:  [97.9 F (36.6 C)-98.3 F (36.8 C)] 98.3 F (36.8 C) (04/30 0513) Pulse Rate:  [74-84] 84 (04/30 0513) Resp:  [18-20] 18 (04/30 0513) BP: (120-133)/(81-97) 130/92 mmHg (04/30 0513) SpO2:  [99 %-100 %] 100 % (04/30 0513) Weight:  [157 lb 9.6 oz (71.487 kg)-162 lb 9.6 oz (73.755 kg)] 157 lb 9.6 oz (71.487 kg) (04/30 0541) Last BM Date: 05/01/12  Weight change: Filed Weights   05/01/12 1700 05/02/12 0500 05/02/12 0541  Weight: 162 lb 9.6 oz (73.755 kg) 157 lb 9.6 oz (71.487 kg) 157 lb 9.6 oz (71.487 kg)    Intake/Output:   Intake/Output Summary (Last 24 hours) at 05/02/12 0714 Last data filed at 05/01/12 2257  Gross per 24 hour  Intake    450 ml  Output      0 ml  Net    450 ml     Physical Exam: General:  Well appearing. No resp difficulty HEENT: normal Neck: supple. JVP 7-8. Carotids 2+ bilat; no bruits. No lymphadenopathy or thryomegaly appreciated. Cor: PMI nondisplaced. Regular rate & rhythm. No rubs, gallops or murmurs. Lungs: clear Abdomen: soft, nontender, nondistended. No hepatosplenomegaly. No bruits or masses. Good bowel sounds. Extremities: no cyanosis, clubbing, rash, trace left ankle edema and no edema on right.   Neuro: alert &  orientedx3, cranial nerves grossly intact. moves all 4 extremities w/o difficulty. Affect pleasant  Telemetry: SR 70s  Labs: Basic Metabolic Panel:  Recent Labs Lab 05/01/12 0934 05/02/12 0134  NA 135 135  K 4.2 3.7  CL 102 95*  CO2 25 29  GLUCOSE 106* 164*  BUN 11 15  CREATININE 0.85 1.08  CALCIUM 8.5 8.9    Liver Function Tests:  Recent Labs Lab 05/01/12 0934  AST 31  ALT 45  ALKPHOS 46  BILITOT 0.3  PROT 5.8*  ALBUMIN 3.1*   No results found for this basename: LIPASE, AMYLASE,  in the last 168 hours No results found for this basename: AMMONIA,  in the last 168 hours  CBC:  Recent Labs Lab 05/01/12 0934  WBC 6.9  NEUTROABS 4.4  HGB 13.4  HCT 40.6  MCV 95.3  PLT 194    Cardiac Enzymes:  Recent Labs Lab 05/01/12 1502 05/01/12 2002 05/02/12 0134  TROPONINI <0.30 <0.30 <0.30    BNP: BNP (last 3 results)  Recent Labs  05/01/12 0935  PROBNP 2656.0*       Imaging: Dg Chest 2 View  05/01/2012  *RADIOLOGY REPORT*  Clinical Data: Chest pain, hypertension.  CHEST - 2 VIEW  Comparison: None  Findings: There is hyperinflation of the lungs compatible with COPD.  Cardiomegaly.  Diffuse bilateral interstitial opacities. Suspect trace left pleural effusion.  No  acute bony abnormality.  IMPRESSION: COPD, cardiomegaly.  Suspect mild interstitial edema/CHF.   Original Report Authenticated By: Charlett Nose, M.D.       Medications:     Scheduled Medications: . sodium chloride   Intravenous STAT  . aspirin EC  81 mg Oral Daily  . atorvastatin  10 mg Oral q1800  . folic acid  1 mg Oral Daily  . furosemide  40 mg Intravenous BID  . heparin  5,000 Units Subcutaneous Q8H  . lisinopril  5 mg Oral Daily  . LORazepam  0-4 mg Oral Q6H   Followed by  . [START ON 05/03/2012] LORazepam  0-4 mg Oral Q12H  . multivitamin with minerals  1 tablet Oral Daily  . nicotine  21 mg Transdermal Daily  . sodium chloride  3 mL Intravenous Q12H  . thiamine  100 mg Oral  Daily   Or  . thiamine  100 mg Intravenous Daily     Infusions:     PRN Medications:  sodium chloride, acetaminophen, LORazepam, LORazepam, menthol-cetylpyridinium, ondansetron (ZOFRAN) IV, sodium chloride, zolpidem   Assessment:   1. Acute on chronic systolic HF  -Lifevest from recent discharge in [place]  2. NICM, EF 20-25%  3. Polysubstance abuse  4. Noncompliance 2/2 financial issues  Plan/Discussion:    Volume status improving.  Will continue IV lasix today and hopefully change to po lasix tomorrow.  Continue lisinopril.  Can start low dose beta blocker.  Would use carvedilol with history of cocaine use.  Add back low dose spiro.   Lifevest at discharge.   O/w per primary team.  Length of Stay: 1  Robbi Garter, [facility] 05/02/2012, 7:14 AM   Patient seen and examined with Ulyess Blossom, PA-C. We discussed all aspects of the encounter. I agree with the assessment and plan as stated above.   Improving with IV lasix. Continue at least one more day. Agree with adding low-dose carvedilol. Titrate lisinopril to 10mg  daily. Stressed need for med compliance.   Supp K+.   Halee Glynn,MD 8:11 AM

## 2012-05-02 NOTE — Progress Notes (Signed)
[facility] PROGRESS NOTE  Charles Cervantes ZOX:[id number] DOB: 1960/03/27 DOA: 05/01/2012 PCP: No primary provider on file.  Assessment/Plan: Acute systolic congestive heart failure  - Cardiology on board.  - 2D echo completed and shows severe hypokinesis of the inferior wall.  EF of 35% - Management per cardiology.  Alcohol abuse  - Patient's last treatment was more than a week ago, and he reports abstinence since  - No anxiety on today's visit and non reported by nursing.  Cocaine abuse  - Patient also reports that he stopped drugs last week prior to [facility] hospitalization  - UDS negative.   DVT prophylaxis  - heparin s q    Code Status: Full  Family Communication: none  Disposition Plan: Once cleared by cardiology will be discharged.   Consultants:  Cardiology: Dr. Gala Romney  Procedures:  Echocardiogram  Antibiotics:  None  HPI/Subjective: Patient has no new complaints. No acute issues overnight.  Objective: Filed Vitals:   05/02/12 0500 05/02/12 0513 05/02/12 0541 05/02/12 1430  BP:  130/92  103/74  Pulse:  84  94  Temp:  98.3 F (36.8 C)  98.4 F (36.9 C)  TempSrc:  Oral  Oral  Resp:  18  20  Height:      Weight: 71.487 kg (157 lb 9.6 oz)  71.487 kg (157 lb 9.6 oz)   SpO2:  100%  97%    Intake/Output Summary (Last 24 hours) at 05/02/12 1610 Last data filed at 05/02/12 1300  Gross per 24 hour  Intake    690 ml  Output      0 ml  Net    690 ml   Filed Weights   05/01/12 1700 05/02/12 0500 05/02/12 0541  Weight: 73.755 kg (162 lb 9.6 oz) 71.487 kg (157 lb 9.6 oz) 71.487 kg (157 lb 9.6 oz)    Exam:   General:  Pt in NAD, Alert and Awake  Cardiovascular: RRR, No M  Respiratory: CTA BL, no wheezes  Abdomen: soft, Nt, ND  Musculoskeletal: no cyanosis or clubbing   Data Reviewed: Basic Metabolic Panel:  Recent Labs Lab 05/01/12 0934 05/02/12 0134 05/02/12 0139  NA 135 135  --   K 4.2 3.7  --   CL 102 95*  --   CO2 25 29  --    GLUCOSE 106* 164*  --   BUN 11 15  --   CREATININE 0.85 1.08  --   CALCIUM 8.5 8.9  --   MG  --   --  1.9   Liver Function Tests:  Recent Labs Lab 05/01/12 0934  AST 31  ALT 45  ALKPHOS 46  BILITOT 0.3  PROT 5.8*  ALBUMIN 3.1*   No results found for this basename: LIPASE, AMYLASE,  in the last 168 hours No results found for this basename: AMMONIA,  in the last 168 hours CBC:  Recent Labs Lab 05/01/12 0934  WBC 6.9  NEUTROABS 4.4  HGB 13.4  HCT 40.6  MCV 95.3  PLT 194   Cardiac Enzymes:  Recent Labs Lab 05/01/12 1502 05/01/12 2002 05/02/12 0134  TROPONINI <0.30 <0.30 <0.30   BNP (last 3 results)  Recent Labs  05/01/12 0935  PROBNP 2656.0*   CBG: No results found for this basename: GLUCAP,  in the last 168 hours  No results found for this or any previous visit (from the past 240 hour(s)).   Studies: Dg Chest 2 View  05/01/2012  *RADIOLOGY REPORT*  Clinical Data: Chest pain, hypertension.  CHEST - 2 VIEW  Comparison: None  Findings: There is hyperinflation of the lungs compatible with COPD.  Cardiomegaly.  Diffuse bilateral interstitial opacities. Suspect trace left pleural effusion.  No acute bony abnormality.  IMPRESSION: COPD, cardiomegaly.  Suspect mild interstitial edema/CHF.   Original Report Authenticated By: Charlett Nose, M.D.     Scheduled Meds: . sodium chloride   Intravenous STAT  . aspirin EC  81 mg Oral Daily  . atorvastatin  10 mg Oral q1800  . carvedilol  3.125 mg Oral BID WC  . folic acid  1 mg Oral Daily  . furosemide  40 mg Intravenous BID  . heparin  5,000 Units Subcutaneous Q8H  . hydrocortisone-pramoxine  1 applicator Rectal BID  . lisinopril  10 mg Oral Daily  . LORazepam  0-4 mg Oral Q6H   Followed by  . [START ON 05/03/2012] LORazepam  0-4 mg Oral Q12H  . multivitamin with minerals  1 tablet Oral Daily  . nicotine  21 mg Transdermal Daily  . sodium chloride  3 mL Intravenous Q12H  . thiamine  100 mg Oral Daily   Or  .  thiamine  100 mg Intravenous Daily   Continuous Infusions:   Active Problems:   Alcohol abuse   Cocaine abuse   Acute systolic congestive heart failure, NYHA class 4    Time spent: > 35 minutes    Penny Pia  [facility] Pager [phone] If 7PM-7AM, please contact night-coverage at [email or web address], password [facility] 05/02/2012, 4:10 PM  LOS: 1 day

## 2012-05-02 NOTE — Plan of Care (Signed)
Problem: Phase I Progression Outcomes Goal: Voiding-avoid urinary catheter unless indicated Outcome: Completed/Met Date Met:  05/02/12 Patient uses urinal     

## 2012-05-02 NOTE — Progress Notes (Signed)
In to speak with pt. Regarding medication assistance and obtaining a PCP here in [place].  Pt. Is from [place], however, he will be relocating here to [facility] permanently.  He does not have insurance at this time, therefore he will qualify for the [facility] program for medication assistance.  In addition, this NCM gave pt. The contact information to the [facility] [phone]).  Pt. May go to the clinic to obtain a PCP and an orange card for medications.  NCM will continue to follow for dc needs.  Tera Mater, RN, BSN NCM [phone]

## 2012-05-03 DIAGNOSIS — F1010 Alcohol abuse, uncomplicated: Secondary | ICD-10-CM

## 2012-05-03 LAB — BASIC METABOLIC PANEL
BUN: 13 mg/dL (ref 6–23)
CO2: 28 mEq/L (ref 19–32)
Chloride: 99 mEq/L (ref 96–112)
Creatinine, Ser: 0.85 mg/dL (ref 0.50–1.35)
GFR calc Af Amer: >90 mL/min (ref >90)

## 2012-05-03 MED ORDER — FUROSEMIDE 40 MG PO TABS: 40.0000 mg | ORAL_TABLET | Freq: Every day | ORAL | Status: DC

## 2012-05-03 MED ORDER — DIGOXIN 250 MCG PO TABS
0.2500 mg | ORAL_TABLET | Freq: Every day | ORAL | Status: DC
  Administered 2012-05-03: 0.25 mg via ORAL
  Filled 2012-05-03: qty 1

## 2012-05-03 MED ORDER — LISINOPRIL 10 MG PO TABS: 10.0000 mg | ORAL_TABLET | Freq: Every day | ORAL | Status: DC

## 2012-05-03 MED ORDER — ATORVASTATIN CALCIUM 10 MG PO TABS: 10.0000 mg | ORAL_TABLET | Freq: Every day | ORAL | Status: DC

## 2012-05-03 MED ORDER — SPIRONOLACTONE 25 MG PO TABS
25.0000 mg | ORAL_TABLET | Freq: Every day | ORAL | Status: DC
  Administered 2012-05-03: 25 mg via ORAL
  Filled 2012-05-03: qty 1

## 2012-05-03 MED ORDER — FUROSEMIDE 40 MG PO TABS
40.0000 mg | ORAL_TABLET | Freq: Every day | ORAL | Status: DC
  Filled 2012-05-03: qty 1

## 2012-05-03 MED ORDER — ASPIRIN 81 MG PO TBEC: 81.0000 mg | DELAYED_RELEASE_TABLET | Freq: Every day | ORAL | Status: DC

## 2012-05-03 MED ORDER — CARVEDILOL 3.125 MG PO TABS: 3.1250 mg | ORAL_TABLET | Freq: Two times a day (BID) | ORAL | Status: DC

## 2012-05-03 MED ORDER — DIGOXIN 250 MCG PO TABS: 0.2500 mg | ORAL_TABLET | Freq: Every day | ORAL | Status: DC

## 2012-05-03 NOTE — Progress Notes (Signed)
Advanced Heart Failure Rounding Note   Subjective:    52 yo AAM with recent diagnosis of systolic heart failure secondary to NICM, EF 20-25%.  As well as polysubstance abuse (cocaine, tobacco and alcohol).  Admitted 4/29 with progressive dyspnea and orthopnea due to inability to afford diuretics.  Started on IV diuresis 40 mg BID. Pertinent labs include: proBNP 2656, Cr 0.85, BUN 11, troponin normal x2. UDS negative. Echo today at [facility] showed EF 20-25% and normal RV per review of Dr. Gala Romney.  Weight down 15 pounds since admit.  Slept better last night.  Sore throat.  +cough.  No dyspnea, orthopnea or PND.  3 occasions of NSVT  Objective:     Vital Signs:   Temp:  [97.3 F (36.3 C)-98.4 F (36.9 C)] 97.3 F (36.3 C) (05/01 0538) Pulse Rate:  [67-94] 71 (05/01 0538) Resp:  [18-20] 19 (05/01 0538) BP: (103-116)/(74-87) 114/85 mmHg (05/01 0538) SpO2:  [97 %-100 %] 100 % (05/01 0538) Weight:  [147 lb (66.679 kg)] 147 lb (66.679 kg) (05/01 0538) Last BM Date: 05/02/12  Weight change: Filed Weights   05/02/12 0541 05/03/12 0500 05/03/12 0538  Weight: 157 lb 9.6 oz (71.487 kg) 147 lb (66.679 kg) 147 lb (66.679 kg)    Intake/Output:   Intake/Output Summary (Last 24 hours) at 05/03/12 0811 Last data filed at 05/03/12 0542  Gross per 24 hour  Intake   1083 ml  Output   1400 ml  Net   -317 ml     Physical Exam: General:  Well appearing. No resp difficulty HEENT: normal Neck: supple. JVP 5-6. Carotids 2+ bilat; no bruits. No lymphadenopathy or thryomegaly appreciated. Cor: PMI nondisplaced. Regular rate & rhythm. No rubs, gallops or murmurs. Lungs: clear Abdomen: soft, nontender, nondistended. No hepatosplenomegaly. No bruits or masses. Good bowel sounds. Extremities: no cyanosis, clubbing, rash, no edema  Neuro: alert & orientedx3, cranial nerves grossly intact. moves all 4 extremities w/o difficulty. Affect pleasant  Telemetry: SR 70s with occ NSVT  Labs: Basic Metabolic  Panel:  Recent Labs Lab 05/01/12 0934 05/02/12 0134 05/02/12 0139 05/03/12 0550  NA 135 135  --  136  K 4.2 3.7  --  4.2  CL 102 95*  --  99  CO2 25 29  --  28  GLUCOSE 106* 164*  --  113*  BUN 11 15  --  13  CREATININE 0.85 1.08  --  0.85  CALCIUM 8.5 8.9  --  9.2  MG  --   --  1.9  --     Liver Function Tests:  Recent Labs Lab 05/01/12 0934  AST 31  ALT 45  ALKPHOS 46  BILITOT 0.3  PROT 5.8*  ALBUMIN 3.1*   No results found for this basename: LIPASE, AMYLASE,  in the last 168 hours No results found for this basename: AMMONIA,  in the last 168 hours  CBC:  Recent Labs Lab 05/01/12 0934  WBC 6.9  NEUTROABS 4.4  HGB 13.4  HCT 40.6  MCV 95.3  PLT 194    Cardiac Enzymes:  Recent Labs Lab 05/01/12 1502 05/01/12 2002 05/02/12 0134  TROPONINI <0.30 <0.30 <0.30    BNP: BNP (last 3 results)  Recent Labs  05/01/12 0935  PROBNP 2656.0*       Imaging: Dg Chest 2 View  05/01/2012  *RADIOLOGY REPORT*  Clinical Data: Chest pain, hypertension.  CHEST - 2 VIEW  Comparison: None  Findings: There is hyperinflation of the lungs compatible with COPD.  Cardiomegaly.  Diffuse bilateral interstitial opacities. Suspect trace left pleural effusion.  No acute bony abnormality.  IMPRESSION: COPD, cardiomegaly.  Suspect mild interstitial edema/CHF.   Original Report Authenticated By: Charlett Nose, M.D.      Medications:     Scheduled Medications: . aspirin EC  81 mg Oral Daily  . atorvastatin  10 mg Oral q1800  . carvedilol  3.125 mg Oral BID WC  . folic acid  1 mg Oral Daily  . furosemide  40 mg Intravenous BID  . heparin  5,000 Units Subcutaneous Q8H  . hydrocortisone-pramoxine  1 applicator Rectal BID  . lisinopril  10 mg Oral Daily  . LORazepam  0-4 mg Oral Q6H   Followed by  . LORazepam  0-4 mg Oral Q12H  . multivitamin with minerals  1 tablet Oral Daily  . nicotine  21 mg Transdermal Daily  . sodium chloride  3 mL Intravenous Q12H  . thiamine  100  mg Oral Daily   Or  . thiamine  100 mg Intravenous Daily    Infusions:    PRN Medications: sodium chloride, acetaminophen, LORazepam, LORazepam, menthol-cetylpyridinium, ondansetron (ZOFRAN) IV, sodium chloride, zolpidem   Assessment:   1. Acute on chronic systolic HF  -Lifevest from recent discharge in [place]  2. NICM, EF 20-25%  3. Polysubstance abuse  4. Noncompliance 2/2 financial issues  Plan/Discussion:    Volume status improved with IV lasix.  Change to po lasix 40 mg today.  Will continue carvedilol, lisinopril and spiro.    Livefest at discharge.    Ok for discharge today and follow up with HF clinic on 5/6 at 9a.  Discharge meds will be provided by HF clinic prior to discharge.    HF meds: Carvedilol 3.125 mg BID Digoxin 0.25 mg daily Lasix 40 mg daily Lisinopril 5 mg BID Spironolactone 25 mg daily  Length of Stay: 2  Robbi Garter, [facility] 05/03/2012, 8:11 AM  Patient seen and examined with Ulyess Blossom, PA-C. We discussed all aspects of the encounter. I agree with the assessment and plan as stated above.   He is much improved but still tenuous. Long talk with him about the seriousness of his condition and his high risk of mortality. Emphasized need for compliance with meds and f/u and also need to abstain from substance abuse. We will provide him 30-day supply of meds. See back in clinic next week. Continue LifeVest.   Truman Hayward 1:56 PM

## 2012-05-03 NOTE — Progress Notes (Signed)
0140 Notified by text page from CMT that patient had 8 beat run of v-tach. Reviewed strip. Patient was resting in bed at the time. VS taken and documented. [employer] of feeling a little lightheaded and some chest fluttering.Text paged  Mid-Level MD with [facility] to notify twice at 0154 and again at 0214. Patient is currently sleeping with no further changes of in heart rhythm .

## 2012-05-03 NOTE — Progress Notes (Signed)
Utilization Review Completed.   Mykel Sponaugle, RN, BSN Nurse Case Manager  [phone]  

## 2012-05-03 NOTE — Progress Notes (Signed)
Verbalized understanding of discharge instructions.  Spoke with client about polysubstance abuse and heart issues.  IV site discontinued.  Heart Failure education completed and he has the hand book.

## 2012-05-03 NOTE — Discharge Summary (Signed)
Physician Discharge Summary  Charles Cervantes ZOX:[id number] DOB: 10-07-1960 DOA: 05/01/2012  PCP: No primary provider on file.  Admit date: 05/01/2012 Discharge date: 05/03/2012  Time spent: > 35 minutes  Recommendations for Outpatient Follow-up:  1. Continue to monitor blood pressures and fluid volume status adjust medications accordingly  Discharge Diagnoses:  Active Problems:   Alcohol abuse   Cocaine abuse   Acute systolic congestive heart failure, NYHA class 4   Discharge Condition: stable  Diet recommendation: Cardiac diet.  Filed Weights   05/02/12 0541 05/03/12 0500 05/03/12 0538  Weight: 71.487 kg (157 lb 9.6 oz) 66.679 kg (147 lb) 66.679 kg (147 lb)    History of present illness:  Advanced Heart Failure Rounding Note   Subjective:      From cardiology notes:  52 yo AAM with recent diagnosis of systolic heart failure secondary to NICM, EF 20-25%. As well as polysubstance abuse (cocaine, tobacco and alcohol).  Admitted 4/29 with progressive dyspnea and orthopnea due to inability to afford diuretics. Started on IV diuresis 40 mg BID. Pertinent labs include: proBNP 2656, Cr 0.85, BUN 11, troponin normal x2. UDS negative. Echo today at [facility] showed EF 20-25% and normal RV per review of Dr. Gala Romney.  Weight down 15 pounds since admit.   Hospital Course:  Acute systolic congestive heart failure  - Cardiology on board and recommends the following: Volume status improved with IV lasix. Change to po lasix 40 mg today. Will continue carvedilol, lisinopril and spiro.  Livefest at discharge.  Ok for discharge today and follow up with HF clinic on 5/6 at 9a.  Discharge meds will be provided by HF clinic prior to discharge.  HF meds:  Carvedilol 3.125 mg BID  Digoxin 0.25 mg daily  Lasix 40 mg daily  Lisinopril 5 mg BID  Spironolactone 25 mg daily  - 2D echo completed and shows severe hypokinesis of the inferior wall. EF of 35%  - Management as indicated above. Patient has  follow up appointment next Tuesday.  Alcohol abuse  - Patient's last treatment was more than a week ago, and he reports abstinence since  - No anxiety on today's visit and non reported by nursing.   Cocaine abuse  - Patient also reports that he stopped drugs last week prior to [facility] hospitalization  - UDS negative initially    Procedures:  2 D echocardiogram  Consultations:  Cardiology: Dr. Gala Romney  Discharge Exam: Filed Vitals:   05/03/12 0147 05/03/12 0500 05/03/12 0538 05/03/12 0900  BP: 113/76  114/85 152/99  Pulse: 67  71 76  Temp: 98.3 F (36.8 C)  97.3 F (36.3 C)   TempSrc: Oral  Oral   Resp: 18  19   Height:      Weight:  66.679 kg (147 lb) 66.679 kg (147 lb)   SpO2: 99%  100%     General: Pt in NAD, alert and Awake Cardiovascular: RRR, No MRG Respiratory: CTA BL, no wheezes  Discharge Instructions  Discharge Orders   Future Appointments Provider Department Dept Phone   05/08/2012 9:00 AM [facility] [phone]   Future Orders Complete By Expires     Call MD for:  difficulty breathing, headache or visual disturbances  As directed     Call MD for:  redness, tenderness, or signs of infection (pain, swelling, redness, odor or green/yellow discharge around incision site)  As directed     Call MD for:  temperature >100.4  As  directed     Diet - low sodium heart healthy  As directed     Discharge instructions  As directed     Comments:      Follow up with the Cardiologist this next Tuesday 05/08/12 for further evaluation and recommendations.  You are to continue with your life vest as indicated by the cardiologist.    Increase activity slowly  As directed         Medication List    STOP taking these medications       AUGMENTIN 875-125 MG per tablet  Generic drug:  amoxicillin-clavulanate     PRESCRIPTION MEDICATION      TAKE these medications       aspirin 81 MG EC tablet  Take 1 tablet  (81 mg total) by mouth daily.     atorvastatin 10 MG tablet  Commonly known as:  LIPITOR  Take 1 tablet (10 mg total) by mouth daily at 6 PM.     carvedilol 3.125 MG tablet  Commonly known as:  COREG  Take 1 tablet (3.125 mg total) by mouth 2 (two) times daily with a meal.     digoxin 0.25 MG tablet  Commonly known as:  LANOXIN  Take 1 tablet (0.25 mg total) by mouth daily.     furosemide 40 MG tablet  Commonly known as:  LASIX  Take 1 tablet (40 mg total) by mouth daily.     lisinopril 10 MG tablet  Commonly known as:  PRINIVIL,ZESTRIL  Take 1 tablet (10 mg total) by mouth daily.     spironolactone 25 MG tablet  Commonly known as:  ALDACTONE  Take 25 mg by mouth daily.       No Known Allergies     Follow-up Information   Follow up with [facility]. (Call to schedule an appointment for PCP)    Contact information:   [address] [place] [address] [phone]      Follow up with Arvilla Meres, MD On 05/08/2012. (at 9a  [employer] Code 0010))    Contact information:   [address] [place] [address] [phone]        The results of significant diagnostics from this hospitalization (including imaging, microbiology, ancillary and laboratory) are listed below for reference.    Significant Diagnostic Studies: Dg Chest 2 View  05/01/2012  *RADIOLOGY REPORT*  Clinical Data: Chest pain, hypertension.  CHEST - 2 VIEW  Comparison: None  Findings: There is hyperinflation of the lungs compatible with COPD.  Cardiomegaly.  Diffuse bilateral interstitial opacities. Suspect trace left pleural effusion.  No acute bony abnormality.  IMPRESSION: COPD, cardiomegaly.  Suspect mild interstitial edema/CHF.   Original Report Authenticated By: Charlett Nose, M.D.     Microbiology: No results found for this or any previous visit (from the past 240 hour(s)).   Labs: Basic Metabolic Panel:  Recent Labs Lab  05/01/12 0934 05/02/12 0134 05/02/12 0139 05/03/12 0550  NA 135 135  --  136  K 4.2 3.7  --  4.2  CL 102 95*  --  99  CO2 25 29  --  28  GLUCOSE 106* 164*  --  113*  BUN 11 15  --  13  CREATININE 0.85 1.08  --  0.85  CALCIUM 8.5 8.9  --  9.2  MG  --   --  1.9  --    Liver Function Tests:  Recent Labs Lab 05/01/12 0934  AST 31  ALT 45  ALKPHOS 46  BILITOT 0.3  PROT 5.8*  ALBUMIN 3.1*   No results found for this basename: LIPASE, AMYLASE,  in the last 168 hours No results found for this basename: AMMONIA,  in the last 168 hours CBC:  Recent Labs Lab 05/01/12 0934  WBC 6.9  NEUTROABS 4.4  HGB 13.4  HCT 40.6  MCV 95.3  PLT 194   Cardiac Enzymes:  Recent Labs Lab 05/01/12 1502 05/01/12 2002 05/02/12 0134  TROPONINI <0.30 <0.30 <0.30   BNP: BNP (last 3 results)  Recent Labs  05/01/12 0935  PROBNP 2656.0*   CBG: No results found for this basename: GLUCAP,  in the last 168 hours     Signed:  Penny Pia  [facility] 05/03/2012, 2:20 PM

## 2012-05-05 ENCOUNTER — Encounter (HOSPITAL_COMMUNITY): Payer: Self-pay | Admitting: *Deleted

## 2012-05-05 ENCOUNTER — Emergency Department (HOSPITAL_COMMUNITY)
Admission: EM | Admit: 2012-05-05 | Discharge: 2012-05-05 | Disposition: A | Discharge status: Home / Self Care | Payer: Medicaid Other | Attending: Emergency Medicine | Admitting: Emergency Medicine

## 2012-05-05 ENCOUNTER — Emergency Department (HOSPITAL_COMMUNITY)
Admission: EM | Admit: 2012-05-05 | Discharge: 2012-05-05 | Disposition: A | Discharge status: Other Acute Inpatient Hospital | Payer: MEDICAID

## 2012-05-05 ENCOUNTER — Telehealth (HOSPITAL_COMMUNITY): Payer: Self-pay | Admitting: Emergency Medicine

## 2012-05-05 DIAGNOSIS — F17200 Nicotine dependence, unspecified, uncomplicated: Secondary | ICD-10-CM | POA: Insufficient documentation

## 2012-05-05 DIAGNOSIS — K6289 Other specified diseases of anus and rectum: Secondary | ICD-10-CM

## 2012-05-05 DIAGNOSIS — E785 Hyperlipidemia, unspecified: Secondary | ICD-10-CM | POA: Insufficient documentation

## 2012-05-05 DIAGNOSIS — Z79899 Other long term (current) drug therapy: Secondary | ICD-10-CM | POA: Insufficient documentation

## 2012-05-05 DIAGNOSIS — G479 Sleep disorder, unspecified: Secondary | ICD-10-CM | POA: Insufficient documentation

## 2012-05-05 DIAGNOSIS — K648 Other hemorrhoids: Secondary | ICD-10-CM | POA: Insufficient documentation

## 2012-05-05 DIAGNOSIS — I10 Essential (primary) hypertension: Secondary | ICD-10-CM | POA: Insufficient documentation

## 2012-05-05 DIAGNOSIS — Z7982 Long term (current) use of aspirin: Secondary | ICD-10-CM | POA: Insufficient documentation

## 2012-05-05 DIAGNOSIS — R197 Diarrhea, unspecified: Secondary | ICD-10-CM

## 2012-05-05 DIAGNOSIS — K5900 Constipation, unspecified: Secondary | ICD-10-CM | POA: Insufficient documentation

## 2012-05-05 MED ORDER — LOPERAMIDE HCL 2 MG PO CAPS: 2.0000 mg | ORAL_CAPSULE | Freq: Four times a day (QID) | ORAL | Status: DC | PRN

## 2012-05-05 MED ORDER — METRONIDAZOLE 500 MG PO TABS: 500.0000 mg | ORAL_TABLET | Freq: Three times a day (TID) | ORAL | Status: AC

## 2012-05-05 NOTE — ED Notes (Signed)
Per EMS pt visiting for a funeral, pt called his PCP who told him to come in for eval

## 2012-05-05 NOTE — ED Notes (Signed)
Chart returned from EDP office. Per Dr. Ranae Palms, had patient return and given Rx.

## 2012-05-05 NOTE — ED Notes (Signed)
Chart given to [facility], who said he will discuss it with Dr. Ranae Palms. Dr. Ranae Palms is currently in a Code.

## 2012-05-05 NOTE — ED Provider Notes (Addendum)
Pt called with result of positive c.diff and need to return for abx rx. Pt stated he would return today.   Loren Racer, MD 05/05/12 1450  PO Flagyl RX given. F/u with PMD.   Loren Racer, MD 05/05/12 1537

## 2012-05-05 NOTE — ED Notes (Signed)
Pt in from home via [facility], pt reports diarrhea x 2 wks & is experiencing rectal pain & swelling, pt denies bloody & dark colored stools, pt presents to ED with Life Vest in place, pt A&O x4, follows commands, speaks in complete sentences

## 2012-05-05 NOTE — ED Notes (Signed)
Pt given a bus pass for transportation

## 2012-05-05 NOTE — ED Notes (Signed)
Lab called with positive stool for c-diff. Chart taken to EDP for review.

## 2012-05-05 NOTE — ED Provider Notes (Signed)
History     CSN: [id number]  Arrival date & time 05/05/12  0906   First MD Initiated Contact with Patient 05/05/12 [phone]      Chief Complaint  Patient presents with  . Diarrhea    (Consider location/radiation/quality/duration/timing/severity/associated sxs/prior treatment) HPI Pt present with 2 weeks of diarrhea since being hospitalized in [place]. Pt states he is having loose stools multiple times a day. No blood in stool. He is having perirectal discomfort. States the area is "raw". No fever chills, abdominal pain. Has used anal cream with little help. Pt d/c from [facility] for CHF 2 days ago. Has f/u appointment in 3 days. No CP, sob. Has been compliant with meds.  Past Medical History  Diagnosis Date  . Hypertension   . Hyperlipidemia     Past Surgical History  Procedure Laterality Date  . Left second finger surgery      No family history on file.  History  Substance Use Topics  . Smoking status: Current Every Day Smoker -- 0.50 packs/day    Types: Cigarettes  . Smokeless tobacco: Never Used  . Alcohol Use: Yes     Comment: daily      Review of Systems  Constitutional: Negative for fever and chills.  Respiratory: Negative for cough and shortness of breath.   Cardiovascular: Negative for chest pain, palpitations and leg swelling.  Gastrointestinal: Positive for diarrhea and constipation. Negative for nausea, vomiting, abdominal pain, blood in stool and anal bleeding.  Skin: Negative for rash and wound.  Neurological: Negative for dizziness, weakness, light-headedness and headaches.  Psychiatric/Behavioral: Positive for sleep disturbance.  All other systems reviewed and are negative.    Allergies  Review of patient's allergies indicates no known allergies.  Home Medications   Current Outpatient Rx  Name  Route  Sig  Dispense  Refill  . aspirin EC 81 MG EC tablet   Oral   Take 1 tablet (81 mg total) by mouth daily.   30 tablet   0   . atorvastatin (LIPITOR)  10 MG tablet   Oral   Take 1 tablet (10 mg total) by mouth daily at 6 PM.   30 tablet   0   . carvedilol (COREG) 3.125 MG tablet   Oral   Take 1 tablet (3.125 mg total) by mouth 2 (two) times daily with a meal.   30 tablet   0   . digoxin (LANOXIN) 0.25 MG tablet   Oral   Take 1 tablet (0.25 mg total) by mouth daily.   30 tablet   0   . furosemide (LASIX) 40 MG tablet   Oral   Take 1 tablet (40 mg total) by mouth daily.   30 tablet   0   . lisinopril (PRINIVIL,ZESTRIL) 10 MG tablet   Oral   Take 1 tablet (10 mg total) by mouth daily.   30 tablet   0   . loperamide (IMODIUM) 2 MG capsule   Oral   Take 1 capsule (2 mg total) by mouth 4 (four) times daily as needed for diarrhea or loose stools.   12 capsule   0   . spironolactone (ALDACTONE) 25 MG tablet   Oral   Take 25 mg by mouth daily.           BP 124/84  Pulse 71  Temp(Src) 98.4 F (36.9 C) (Oral)  Resp 18  SpO2 98%  Physical Exam  Nursing note and vitals reviewed. Constitutional: He is oriented to person, place,  and time. He appears well-developed and well-nourished. No distress.  HENT:  Head: Normocephalic and atraumatic.  Mouth/Throat: Oropharynx is clear and moist.  Eyes: EOM are normal. Pupils are equal, round, and reactive to light.  Neck: Normal range of motion. Neck supple.  Cardiovascular: Normal rate and regular rhythm.   Pulmonary/Chest: Effort normal and breath sounds normal. No respiratory distress. He has no wheezes. He has no rales. He exhibits no tenderness.  Abdominal: Soft. Bowel sounds are normal. He exhibits no distension and no mass. There is no tenderness. There is no rebound and no guarding.  +internal hemmoroids. No anal fissure or abscesses. +brown stool.   Musculoskeletal: Normal range of motion. He exhibits no edema and no tenderness.  Neurological: He is alert and oriented to person, place, and time.  Skin: Skin is warm and dry. No rash noted. No erythema.  Psychiatric:  He has a normal mood and affect. His behavior is normal.    ED Course  Procedures (including critical care time)  Labs Reviewed  CLOSTRIDIUM DIFFICILE BY PCR   No results found.   1. Diarrhea   2. Rectal pain       MDM  Advised to keep the area clean, use sitz baths, cont rectal cream. Will send c.diff. Pt to f/u with PMD. Return precautions given.         Loren Racer, MD 05/05/12 [phone]

## 2012-05-08 ENCOUNTER — Ambulatory Visit (HOSPITAL_COMMUNITY)
Admit: 2012-05-08 | Discharge: 2012-05-08 | Disposition: A | Discharge status: Home / Self Care | Payer: Medicaid Other | Source: Ambulatory Visit | Attending: Internal Medicine | Admitting: Internal Medicine

## 2012-05-08 ENCOUNTER — Encounter (HOSPITAL_COMMUNITY): Payer: Self-pay

## 2012-05-08 VITALS — BP 106/58 | HR 70 | Wt 162.5 lb

## 2012-05-08 DIAGNOSIS — I5021 Acute systolic (congestive) heart failure: Secondary | ICD-10-CM

## 2012-05-08 DIAGNOSIS — E785 Hyperlipidemia, unspecified: Secondary | ICD-10-CM | POA: Insufficient documentation

## 2012-05-08 DIAGNOSIS — I5023 Acute on chronic systolic (congestive) heart failure: Secondary | ICD-10-CM | POA: Insufficient documentation

## 2012-05-08 DIAGNOSIS — I5022 Chronic systolic (congestive) heart failure: Secondary | ICD-10-CM | POA: Insufficient documentation

## 2012-05-08 DIAGNOSIS — I509 Heart failure, unspecified: Secondary | ICD-10-CM | POA: Insufficient documentation

## 2012-05-08 DIAGNOSIS — F1910 Other psychoactive substance abuse, uncomplicated: Secondary | ICD-10-CM

## 2012-05-08 DIAGNOSIS — I10 Essential (primary) hypertension: Secondary | ICD-10-CM | POA: Insufficient documentation

## 2012-05-08 LAB — BASIC METABOLIC PANEL
BUN: 18 mg/dL (ref 6–23)
Calcium: 9.5 mg/dL (ref 8.4–10.5)
Creatinine, Ser: 1.08 mg/dL (ref 0.50–1.35)
GFR calc Af Amer: >90 mL/min (ref >90)
GFR calc non Af Amer: 78 mL/min — ABNORMAL LOW (ref >90)

## 2012-05-08 NOTE — Assessment & Plan Note (Signed)
Volume status increasing due to dietary indiscretions.  Will double lasix 40 mg BID.  Have had a long discussion on low sodium diet and fluid restrictions with the patient and his aunt.  He states he will work on fluid intake and low sodium choices.  Have offered him diet classes through the clinic, they will discuss this.  I have also suggested enrollment in [employer] trial.  Renae Fickle with research has discussed the trial with the patient and he is going to discuss with his family and let us know at follow up.

## 2012-05-08 NOTE — Patient Instructions (Addendum)
Increase lasix to 1 tablet twice daily.  Labs today.  Follow up 2 weeks.

## 2012-05-08 NOTE — Assessment & Plan Note (Signed)
Have encouraged him to keep up the good work with remaining abstinent.

## 2012-05-08 NOTE — Progress Notes (Addendum)
Weight Range   Baseline proBNP   2656 on 05/01/12    HPI: Charles Cervantes is a 52 yo AAM with recent diagnosis of systolic heart failure secondary to NICM, EF 20-25%. As well as polysubstance abuse (cocaine, tobacco and alcohol).  New diagnosis of systolic HF in [place] in 04/2012.  He was discharged with Rx for IV lasix, lisinopril and spiro.  He was unable to afford IV lasix and therefore had no fluid pill for ~1 week.  He has moved from [facility] and admitted to [facility] on 05/01/12 with progressive dyspnea and orthopnea.  Echo on admit showed EF 20-25% with normal RV.  He diuresed 15 pounds during admission. Discharge weight 147 pounds.  He also had NSVT while in house and will be continued on Lifevest placed in [place].    He returns for post hospital follow up.  He says he feels tired but ok.  He has been having trouble sleeping the past couple of nights.  Weight at home is up 4 pounds to 160 pounds.  In clinic weight is up 15 pounds (different scales).  He is drinking about a gallon of fluid a day.  He is not following low sodium diet as he has had fried chicken, bar-b-que and pork chops with sauce.  He denies edema.  No chest pain.   No cocaine or alcohol.  Has had 4 cigarettes.     ROS: All systems negative except as listed in HPI, PMH and Problem List.  Past Medical History  Diagnosis Date  . Hypertension   . Hyperlipidemia     Current Outpatient Prescriptions  Medication Sig Dispense Refill  . aspirin EC 81 MG EC tablet Take 1 tablet (81 mg total) by mouth daily.  30 tablet  0  . atorvastatin (LIPITOR) 10 MG tablet Take 1 tablet (10 mg total) by mouth daily at 6 PM.  30 tablet  0  . carvedilol (COREG) 3.125 MG tablet Take 1 tablet (3.125 mg total) by mouth 2 (two) times daily with a meal.  30 tablet  0  . digoxin (LANOXIN) 0.25 MG tablet Take 1 tablet (0.25 mg total) by mouth daily.  30 tablet  0  . furosemide (LASIX) 40 MG tablet Take 1 tablet (40 mg total) by mouth daily.  30 tablet  0  .  hydrocortisone-pramoxine (PROCTOFOAM-HC) rectal foam Place 1 applicator rectally 2 (two) times daily.      Marland Kitchen lisinopril (PRINIVIL,ZESTRIL) 10 MG tablet Take 1 tablet (10 mg total) by mouth daily.  30 tablet  0  . loperamide (IMODIUM) 2 MG capsule Take 1 capsule (2 mg total) by mouth 4 (four) times daily as needed for diarrhea or loose stools.  12 capsule  0  . metroNIDAZOLE (FLAGYL) 500 MG tablet Take 1 tablet (500 mg total) by mouth 3 (three) times daily.  30 tablet  0  . spironolactone (ALDACTONE) 25 MG tablet Take 25 mg by mouth daily.       No current facility-administered medications for this encounter.     PHYSICAL EXAM: Filed Vitals:   05/08/12 0922  BP: 106/58  Pulse: 70  Weight: 162 lb 8 oz (73.71 kg)  SpO2: 99%    General:  Well appearing. No resp difficulty HEENT: normal Neck: supple. JVP 9-10. Carotids 2+ bilaterally; no bruits. No lymphadenopathy or thryomegaly appreciated. Cor: PMI normal. Regular rate & rhythm. No rubs, or murmurs. +S3 Lungs: clear Abdomen: soft, nontender, mild distention. No hepatosplenomegaly. No bruits or masses. Good bowel sounds.  Extremities: no cyanosis, clubbing, rash, edema Neuro: alert & orientedx3, cranial nerves grossly intact. Moves all 4 extremities w/o difficulty. Affect pleasant.   ASSESSMENT & PLAN:

## 2012-05-23 ENCOUNTER — Encounter (HOSPITAL_COMMUNITY): Payer: Self-pay

## 2012-05-23 ENCOUNTER — Emergency Department (HOSPITAL_COMMUNITY)
Admission: EM | Admit: 2012-05-23 | Discharge: 2012-05-23 | Disposition: A | Discharge status: Home / Self Care | Payer: Medicaid Other | Attending: Emergency Medicine | Admitting: Emergency Medicine

## 2012-05-23 ENCOUNTER — Encounter (HOSPITAL_COMMUNITY): Payer: Self-pay | Admitting: Emergency Medicine

## 2012-05-23 ENCOUNTER — Ambulatory Visit (HOSPITAL_BASED_OUTPATIENT_CLINIC_OR_DEPARTMENT_OTHER)
Admission: RE | Admit: 2012-05-23 | Discharge: 2012-05-23 | Disposition: A | Discharge status: Home / Self Care | Payer: Medicaid Other | Source: Ambulatory Visit | Attending: Internal Medicine | Admitting: Internal Medicine

## 2012-05-23 ENCOUNTER — Emergency Department (HOSPITAL_COMMUNITY): Payer: Medicaid Other

## 2012-05-23 VITALS — BP 116/64 | HR 72 | Resp 19 | Ht 73.0 in | Wt 170.8 lb

## 2012-05-23 DIAGNOSIS — R109 Unspecified abdominal pain: Secondary | ICD-10-CM | POA: Insufficient documentation

## 2012-05-23 DIAGNOSIS — E785 Hyperlipidemia, unspecified: Secondary | ICD-10-CM | POA: Insufficient documentation

## 2012-05-23 DIAGNOSIS — F1910 Other psychoactive substance abuse, uncomplicated: Secondary | ICD-10-CM

## 2012-05-23 DIAGNOSIS — Z8619 Personal history of other infectious and parasitic diseases: Secondary | ICD-10-CM | POA: Insufficient documentation

## 2012-05-23 DIAGNOSIS — I5032 Chronic diastolic (congestive) heart failure: Secondary | ICD-10-CM | POA: Insufficient documentation

## 2012-05-23 DIAGNOSIS — I5022 Chronic systolic (congestive) heart failure: Secondary | ICD-10-CM

## 2012-05-23 DIAGNOSIS — F17200 Nicotine dependence, unspecified, uncomplicated: Secondary | ICD-10-CM | POA: Insufficient documentation

## 2012-05-23 DIAGNOSIS — I10 Essential (primary) hypertension: Secondary | ICD-10-CM | POA: Insufficient documentation

## 2012-05-23 DIAGNOSIS — R197 Diarrhea, unspecified: Secondary | ICD-10-CM | POA: Insufficient documentation

## 2012-05-23 DIAGNOSIS — Z79899 Other long term (current) drug therapy: Secondary | ICD-10-CM | POA: Insufficient documentation

## 2012-05-23 LAB — CBC WITH DIFFERENTIAL/PLATELET
Basophils Relative: 0 % (ref 0–1)
Eosinophils Absolute: 0.4 10*3/uL (ref 0.0–0.7)
Eosinophils Relative: 6 % — ABNORMAL HIGH (ref 0–5)
MCH: 31 pg (ref 26.0–34.0)
MCHC: 34.5 g/dL (ref 30.0–36.0)
Neutrophils Relative %: 45 % (ref 43–77)
Platelets: 206 10*3/uL (ref 150–400)

## 2012-05-23 LAB — CLOSTRIDIUM DIFFICILE BY PCR: Toxigenic C. Difficile by PCR: NEGATIVE

## 2012-05-23 LAB — COMPREHENSIVE METABOLIC PANEL
Albumin: 3.6 g/dL (ref 3.5–5.2)
Alkaline Phosphatase: 46 U/L (ref 39–117)
BUN: 17 mg/dL (ref 6–23)
Calcium: 8.8 mg/dL (ref 8.4–10.5)
Potassium: 3.9 mEq/L (ref 3.5–5.1)
Sodium: 133 mEq/L — ABNORMAL LOW (ref 135–145)
Total Protein: 6.8 g/dL (ref 6.0–8.3)

## 2012-05-23 MED ORDER — HYDROMORPHONE HCL PF 1 MG/ML IJ SOLN
1.0000 mg | Freq: Once | INTRAMUSCULAR | Status: AC
  Administered 2012-05-23: 1 mg via INTRAVENOUS
  Filled 2012-05-23: qty 1

## 2012-05-23 MED ORDER — METRONIDAZOLE 500 MG PO TABS: 500.0000 mg | ORAL_TABLET | Freq: Three times a day (TID) | ORAL | Status: DC

## 2012-05-23 MED ORDER — DIGOXIN 125 MCG PO TABS
0.2500 mg | ORAL_TABLET | Freq: Once | ORAL | Status: AC
  Administered 2012-05-23: 0.25 mg via ORAL
  Filled 2012-05-23: qty 2

## 2012-05-23 MED ORDER — CARVEDILOL 6.25 MG PO TABS: 6.2500 mg | ORAL_TABLET | Freq: Two times a day (BID) | ORAL | Status: DC

## 2012-05-23 MED ORDER — ONDANSETRON HCL 4 MG/2ML IJ SOLN
4.0000 mg | Freq: Once | INTRAMUSCULAR | Status: AC
  Administered 2012-05-23: 4 mg via INTRAVENOUS
  Filled 2012-05-23: qty 2

## 2012-05-23 MED ORDER — HYDROCODONE-ACETAMINOPHEN 5-325 MG PO TABS: 1.0000 | ORAL_TABLET | Freq: Four times a day (QID) | ORAL | Status: DC | PRN

## 2012-05-23 MED ORDER — LACTATED RINGERS IV BOLUS (SEPSIS)
1000.0000 mL | Freq: Once | INTRAVENOUS | Status: AC
  Administered 2012-05-23: 1000 mL via INTRAVENOUS

## 2012-05-23 NOTE — ED Notes (Signed)
Pt states that his diarrhea was getting better with the flagyl, but is has returned since he finished the prescription

## 2012-05-23 NOTE — ED Notes (Signed)
Per verbal order from Dr. Rulon Abide, Breakfast tray ordered for pt

## 2012-05-23 NOTE — Progress Notes (Signed)
Weight Range   Baseline proBNP   2656 on 05/01/12    HPI: [name] is a 52 yo AAM with recent diagnosis of systolic heart failure secondary to NICM, EF 20-25%. As well as polysubstance abuse (cocaine, tobacco and alcohol).  New diagnosis of systolic HF in [place] in 04/2012.  He was discharged with Rx for IV lasix, lisinopril and spiro.  He was unable to afford IV lasix and therefore had no fluid pill for ~1 week.  He has moved from [facility] and admitted to [facility] on 05/01/12 with progressive dyspnea and orthopnea.  Echo on admit showed EF 20-25% with normal RV.  He diuresed 15 pounds during admission. Discharge weight 147 pounds.  He also had NSVT while in house and will be continued on Lifevest placed in [place].    He returns for follow up today.  He is tired and has not had any sleep due to being in the ER all night for diarrhea.  He has had 9 bouts Monday and 13 bouts yesterday.  He says he finished his flagyl on Saturday.  They have restarted his flagyl this morning, he will pick up after he leaves today.  He says his weight is stable at home.  He is working on a low sodium diet but have a difficult time.  He denies dizziness.  No SOB, orthopnea or PND.  He denies cocaine or alcohol.  Occ cigarette.  He says he is compliant with medications.  Wearing LifeVest.  He had an alarm when he ran to catch up with someone the other day.   ROS: All systems negative except as listed in HPI, PMH and Problem List.  Past Medical History  Diagnosis Date  . Hypertension   . Hyperlipidemia     Current Outpatient Prescriptions  Medication Sig Dispense Refill  . atorvastatin (LIPITOR) 10 MG tablet Take 1 tablet (10 mg total) by mouth daily at 6 PM.  30 tablet  0  . carvedilol (COREG) 3.125 MG tablet Take 1 tablet (3.125 mg total) by mouth 2 (two) times daily with a meal.  30 tablet  0  . digoxin (LANOXIN) 0.25 MG tablet Take 1 tablet (0.25 mg total) by mouth daily.  30 tablet  0  . furosemide (LASIX) 40 MG tablet  Take 1 tablet (40 mg total) by mouth daily.  30 tablet  0  . HYDROcodone-acetaminophen (NORCO/VICODIN) 5-325 MG per tablet Take 1-2 tablets by mouth every 6 (six) hours as needed for pain.  17 tablet  0  . hydrocortisone-pramoxine (PROCTOFOAM-HC) rectal foam Place 1 applicator rectally 2 (two) times daily as needed.       Marland Kitchen lisinopril (PRINIVIL,ZESTRIL) 5 MG tablet Take 5 mg by mouth daily.      Marland Kitchen loperamide (IMODIUM) 2 MG capsule Take 1 capsule (2 mg total) by mouth 4 (four) times daily as needed for diarrhea or loose stools.  12 capsule  0  . metroNIDAZOLE (FLAGYL) 500 MG tablet Take 1 tablet (500 mg total) by mouth 3 (three) times daily.  42 tablet  0  . spironolactone (ALDACTONE) 25 MG tablet Take 25 mg by mouth daily.       No current facility-administered medications for this encounter.     PHYSICAL EXAM: Filed Vitals:   05/23/12 1405  BP: 116/64  Pulse: 72  Resp: 19  Height: 6\' 1"  (1.854 m)  Weight: 170 lb 12.8 oz (77.474 kg)  SpO2: 97%    General:  Well appearing. No resp difficulty HEENT:  normal Neck: supple. JVP 6-7. Carotids 2+ bilaterally; no bruits. No lymphadenopathy or thryomegaly appreciated. Cor: PMI normal. Regular rate & rhythm. No rubs, or murmurs. +S3 Lungs: clear Abdomen: soft, nontender, mild distention. No hepatosplenomegaly. No bruits or masses. Good bowel sounds. Extremities: no cyanosis, clubbing, rash, edema Neuro: alert & orientedx3, cranial nerves grossly intact. Moves all 4 extremities w/o difficulty. Affect pleasant.   ASSESSMENT & PLAN:

## 2012-05-23 NOTE — Progress Notes (Signed)
Patient discharged from [facility] this am.  As per ED RN, patient having difficulty paying for medications (Flagyl). Patient assisted by [facility] by entering patient into [facility] program.  Instructed patient to get prescriptions filled in seven days and to present [facility] letter to the pharmacist. Patient stated that he will be getting them filled today. Patient made aware that he will not be eligible for this service again until May of 2015.  Patient verbalized understading.  Patient grateful for medication assistance.

## 2012-05-23 NOTE — ED Notes (Signed)
Talked with Case Manager about pt request for help with medications. Pt discharged and asked to wait in the lobby. Case Manager to follow up with pt.

## 2012-05-23 NOTE — ED Notes (Signed)
PT. REPORTS DIARRHEA AND LOW ABDOMINAL CRAMPING  FOR SEVERAL DAYS SEEN HERE FOR DIARRHEA PRESCRIBED WITH FLAGYL WITH NO IMPROVEMENT . PT. IS WEARING EXTERNAL DEFIBRILLATOR.

## 2012-05-23 NOTE — ED Provider Notes (Addendum)
History     CSN: [id number]  Arrival date & time 05/23/12  0054   First MD Initiated Contact with Patient 05/23/12 0301      Chief Complaint  Patient presents with  . Diarrhea    (Consider location/radiation/quality/duration/timing/severity/associated sxs/prior treatment) HPI Charles Cervantes is a 52 y.o. male has a history of diarrhea, congestive heart failure, chronic - does have a followup appointment later today with Dr. Ronnald Ramp is currently wearing heart monitor. Patient denies any palpitations or chest pain or shortness of breath. Does complain of severe diarrhea over the last 2 days. Patient completed a 10 day course of metronidazole last Friday and on Monday started having severe diarrhea again 13 times a Monday and 9 times on Tuesday. This is been no blood in the stools.  No black stools. No fevers, no chills and no nausea or vomiting to.  He's had some lower abdominal cramping but is been intermittent, moderate to severe not responding to over-the-counter medications.    Past Medical History  Diagnosis Date  . Hypertension   . Hyperlipidemia     Past Surgical History  Procedure Laterality Date  . Left second finger surgery      No family history on file.  History  Substance Use Topics  . Smoking status: Current Some Day Smoker -- 0.50 packs/day    Types: Cigarettes  . Smokeless tobacco: Never Used  . Alcohol Use: Yes     Comment: daily      Review of Systems At least 10pt or greater review of systems completed and are negative except where specified in the HPI.  Allergies  Review of patient's allergies indicates no known allergies.  Home Medications   Current Outpatient Rx  Name  Route  Sig  Dispense  Refill  . atorvastatin (LIPITOR) 10 MG tablet   Oral   Take 1 tablet (10 mg total) by mouth daily at 6 PM.   30 tablet   0   . carvedilol (COREG) 3.125 MG tablet   Oral   Take 1 tablet (3.125 mg total) by mouth 2 (two) times daily with a meal.   30  tablet   0   . digoxin (LANOXIN) 0.25 MG tablet   Oral   Take 1 tablet (0.25 mg total) by mouth daily.   30 tablet   0   . furosemide (LASIX) 40 MG tablet   Oral   Take 1 tablet (40 mg total) by mouth daily.   30 tablet   0   . hydrocortisone-pramoxine (PROCTOFOAM-HC) rectal foam   Rectal   Place 1 applicator rectally 2 (two) times daily as needed.          Marland Kitchen lisinopril (PRINIVIL,ZESTRIL) 5 MG tablet   Oral   Take 5 mg by mouth daily.         Marland Kitchen loperamide (IMODIUM) 2 MG capsule   Oral   Take 1 capsule (2 mg total) by mouth 4 (four) times daily as needed for diarrhea or loose stools.   12 capsule   0   . spironolactone (ALDACTONE) 25 MG tablet   Oral   Take 25 mg by mouth daily.           BP 131/79  Pulse 65  Temp(Src) 98.2 F (36.8 C) (Oral)  Resp 14  SpO2 98%  Physical Exam  Nursing notes reviewed.  Electronic medical record reviewed. VITAL SIGNS:   Filed Vitals:   05/23/12 0615 05/23/12 0630 05/23/12 0644 05/23/12 0645  BP:  111/65 118/72  118/74  Pulse: 52 54 61 58  Temp:      TempSrc:      Resp: 16 16  20   SpO2: 99% 100%  100%   CONSTITUTIONAL: Awake, oriented, appears non-toxic HENT: Atraumatic, normocephalic, oral mucosa pink and moist, airway patent. Nares patent without drainage. External ears normal. EYES: Conjunctiva clear, EOMI, PERRLA NECK: Trachea midline, non-tender, supple CARDIOVASCULAR: Normal heart rate, Normal rhythm, No murmurs, rubs, gallops PULMONARY/CHEST: Clear to auscultation, no rhonchi, wheezes, or rales. Symmetrical breath sounds. Non-tender. ABDOMINAL: Non-distended, soft, non-tender - no rebound or guarding.  BS normal. NEUROLOGIC: Non-focal, moving all four extremities, no gross sensory or motor deficits. EXTREMITIES: No clubbing, cyanosis, or edema SKIN: Warm, Dry, No erythema, No rash  ED Course  Procedures (including critical care time)  Date: 05/23/2012  Rate: 50  Rhythm: Sinus bradycardia  QRS Axis:  normal  Intervals: normal  ST/T Wave abnormalities: normal  Conduction Disutrbances: none  Narrative Interpretation: unremarkable - no significant morphological change when compared with prior EKG from 05/05/2012. No ST or T wave abnormalities suggestive of acute ischemia or infarction  Labs Reviewed  DIGOXIN LEVEL - Abnormal; Notable for the following:    Digoxin Level 0.3 (*)    All other components within normal limits  CBC WITH DIFFERENTIAL - Abnormal; Notable for the following:    Monocytes Relative 16 (*)    Monocytes Absolute 1.1 (*)    Eosinophils Relative 6 (*)    All other components within normal limits  COMPREHENSIVE METABOLIC PANEL - Abnormal; Notable for the following:    Sodium 133 (*)    Glucose, Bld 106 (*)    All other components within normal limits  CLOSTRIDIUM DIFFICILE BY PCR   Dg Abd 2 Views  05/23/2012   *RADIOLOGY REPORT*  Clinical Data: Abdominal pain.  ABDOMEN - 2 VIEW  Comparison: None  Findings: The lung bases are clear.  Moderate scattered air throughout the small bowel and colon could suggest a mild ileus or gastroenteritis.  No findings for obstruction or perforation.  The soft tissue shadows are maintained.  The bony structures are intact.  IMPRESSION:  Bowel gas pattern suggests an ileus or gastroenteritis.  No findings for obstruction.   Original Report Authenticated By: Rudie Meyer, M.D.     1. Diarrhea   2. History of Clostridium difficile infection   3. Chronic systolic heart failure       MDM  In patients with likely recurrence of C. difficile colitis.  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 Patient has a normal white count, he's afebrile, nontoxic. and I think he will do well with outpatient therapy. We'll treat him again with metronidazole, 14 days this time 3 times a day 500 mg. I discussed this treatment plan with infectious disease  specialist on call Dr. Ninetta Lights.    Patient understands accepts medical plan as it's been dictated, and his questions have been answered. Patient asking for something to eat, we'll give him a breakfast tray before he leaves. He is a followup appointment later today with cardiology at [facility].  No cardiology complaints, do not think he's got an intra-abdominal or intrathoracic emergency at this time.      Jones Skene, MD 05/23/12 1610  Jones Skene, MD 05/23/12 [phone]

## 2012-05-23 NOTE — Patient Instructions (Addendum)
Will increase carvedilol 6.25 mg (2 tabs) twice daily.  But when you pick up new prescription then start back on 1 tablet twice daily.  Follow up 3-4 weeks.

## 2012-05-25 NOTE — Assessment & Plan Note (Signed)
Volume status does not appear elevated but weight continues to climb.  I am hesitant to increase lasix at this time with multiple episodes of diarrhea.  Once diarrhea has improved he will try to double lasix to improve fluid.  Will also titrate carvedilol to 6.25 mg BID.  Continue digoxin, spiro and lisinopril.  Will continue Lifevest at this time.  Recheck echo in 2 months to reassess LVEF.

## 2012-05-25 NOTE — Assessment & Plan Note (Signed)
Encouraged him to abstain completely from tobacco and congratulated him on cocaine/alcohol cessation.

## 2012-06-08 ENCOUNTER — Telehealth (HOSPITAL_COMMUNITY): Payer: Self-pay | Admitting: Anesthesiology

## 2012-06-08 MED ORDER — POTASSIUM CHLORIDE CRYS ER 20 MEQ PO TBCR: 40.0000 meq | EXTENDED_RELEASE_TABLET | Freq: Every day | ORAL | Status: DC

## 2012-06-08 NOTE — Telephone Encounter (Signed)
Patient called and stated that he has been SOB and LE swelling R >L. Reports that he was told to increase lasix to 40 mg BID, however last note says 40 mg daily. Instructions to take extra 40 mg lasix today. Patient not on any K+ and last K+ 3.9, sent prescription to Outpatient Pharmacy for patient to start 40 meq daily. Patient has appt on Monday. Concerned that patient maybe confused about how to take medications.

## 2012-06-11 ENCOUNTER — Ambulatory Visit (HOSPITAL_COMMUNITY)
Admission: RE | Admit: 2012-06-11 | Discharge: 2012-06-11 | Disposition: A | Discharge status: Home / Self Care | Payer: Medicaid Other | Source: Ambulatory Visit | Attending: Internal Medicine | Admitting: Internal Medicine

## 2012-06-11 VITALS — BP 115/80 | HR 60 | Wt 168.0 lb

## 2012-06-11 DIAGNOSIS — I5032 Chronic diastolic (congestive) heart failure: Secondary | ICD-10-CM | POA: Insufficient documentation

## 2012-06-11 DIAGNOSIS — F1910 Other psychoactive substance abuse, uncomplicated: Secondary | ICD-10-CM | POA: Insufficient documentation

## 2012-06-11 DIAGNOSIS — I5022 Chronic systolic (congestive) heart failure: Secondary | ICD-10-CM

## 2012-06-11 DIAGNOSIS — F1010 Alcohol abuse, uncomplicated: Secondary | ICD-10-CM

## 2012-06-11 DIAGNOSIS — R0609 Other forms of dyspnea: Secondary | ICD-10-CM | POA: Insufficient documentation

## 2012-06-11 DIAGNOSIS — R0989 Other specified symptoms and signs involving the circulatory and respiratory systems: Secondary | ICD-10-CM | POA: Insufficient documentation

## 2012-06-11 LAB — BASIC METABOLIC PANEL
CO2: 26 mEq/L (ref 19–32)
Chloride: 102 mEq/L (ref 96–112)
Creatinine, Ser: 1 mg/dL (ref 0.50–1.35)
GFR calc Af Amer: >90 mL/min (ref >90)
Potassium: 3.9 mEq/L (ref 3.5–5.1)

## 2012-06-11 MED ORDER — LISINOPRIL 5 MG PO TABS: 5.0000 mg | ORAL_TABLET | Freq: Two times a day (BID) | ORAL | Status: DC

## 2012-06-11 NOTE — Assessment & Plan Note (Addendum)
Remains alcohol free. Continue to attend AA meetings.   Attending: Agree.

## 2012-06-11 NOTE — Progress Notes (Signed)
Patient ID: Deitrich Steve, male   DOB: 1960-06-22, 52 y.o.   MRN: [id number]  Weight Range   Baseline proBNP   2656 on 05/01/12    HPI: Mr Rotert is a 52 yo AAM with recent diagnosis of systolic heart failure secondary to NICM, EF 20-25%. As well as polysubstance abuse (cocaine, tobacco and alcohol).  New diagnosis of systolic HF in [place] in 04/2012.  He was discharged with Rx for IV lasix, lisinopril and spiro.  He was unable to afford IV lasix and therefore had no fluid pill for ~1 week.  He has moved from [facility] and admitted to [facility] on 05/01/12 with progressive dyspnea and orthopnea.  Echo on admit showed EF 20-25% with normal RV.  Discharge weight 147 pounds.  He also had NSVT while in house and will be continued on Lifevest placed in [place].    He returns for follow up today.  Last visit carvedilol increased to 6.25 twice a day. Complains of dyspnea with exertion. Denies orthopnea/PND. Complains of diarrhea 2-3 times per day. Weight at home 160-165 pounds. Compliant with medications. Wearing Life Vest. Denies cocaine and alcohol use. Attends AA and NA meetings  2-3 times per week. Smokes 2 cigarettes per day. Lives with his aunt.  Unemployed.    05/23/12 Creatinine 0.97 Potassium 3.9 Digoxin 0.3 Negative for C-Diff    ROS: All systems negative except as listed in HPI, PMH and Problem List.  Past Medical History  Diagnosis Date  . Hypertension   . Hyperlipidemia     Current Outpatient Prescriptions  Medication Sig Dispense Refill  . atorvastatin (LIPITOR) 10 MG tablet Take 1 tablet (10 mg total) by mouth daily at 6 PM.  30 tablet  0  . digoxin (LANOXIN) 0.25 MG tablet Take 1 tablet (0.25 mg total) by mouth daily.  30 tablet  0  . carvedilol (COREG) 6.25 MG tablet Take 1 tablet (6.25 mg total) by mouth 2 (two) times daily with a meal.  30 tablet  0  . furosemide (LASIX) 40 MG tablet Take 1 tablet (40 mg total) by mouth daily.  30 tablet  0  . hydrocortisone-pramoxine (PROCTOFOAM-HC) rectal  foam Place 1 applicator rectally 2 (two) times daily as needed.       Marland Kitchen lisinopril (PRINIVIL,ZESTRIL) 5 MG tablet Take 5 mg by mouth daily.      Marland Kitchen loperamide (IMODIUM) 2 MG capsule Take 1 capsule (2 mg total) by mouth 4 (four) times daily as needed for diarrhea or loose stools.  12 capsule  0  . potassium chloride SA (K-DUR,KLOR-CON) 20 MEQ tablet Take 2 tablets (40 mEq total) by mouth daily.  90 tablet  3  . spironolactone (ALDACTONE) 25 MG tablet Take 25 mg by mouth daily.       No current facility-administered medications for this encounter.     PHYSICAL EXAM: Filed Vitals:   06/11/12 1259  BP: 115/80  Pulse: 60  Weight: 168 lb (76.204 kg)    General:  Well appearing. No resp difficulty HEENT: normal Neck: supple. JVP 6-7. Carotids 2+ bilaterally; no bruits. No lymphadenopathy or thryomegaly appreciated. Cor: PMI normal. Regular rate & rhythm. No rubs, or murmurs. +S3 Lungs: clear Abdomen: soft, nontender, mild distention. No hepatosplenomegaly. No bruits or masses. Good bowel sounds. Extremities: no cyanosis, clubbing, rash, edema Neuro: alert & orientedx3, cranial nerves grossly intact. Moves all 4 extremities w/o difficulty. Affect pleasant.   ASSESSMENT & PLAN:

## 2012-06-11 NOTE — Patient Instructions (Addendum)
Follow up in 3 weeks  Take lisinopril 5 mg twice a day  Do the following things EVERYDAY: 1) Weigh yourself in the morning before breakfast. Write it down and keep it in a log. 2) Take your medicines as prescribed 3) Eat low salt foods-Limit salt (sodium) to 2000 mg per day.  4) Stay as active as you can everyday 5) Limit all fluids for the day to less than 2 liters

## 2012-06-11 NOTE — Assessment & Plan Note (Addendum)
NYHA II. Remains SOB with inclined surfaces. HR 60 will not titrate beta blockers. Increase lisinopril 5 mg twice a day. Check BMET today. Follow 3-4 weeks.   Patient seen and examined with [name], NP. We discussed all aspects of the encounter. I agree with the assessment and plan as stated above. Symptomatically improved. Volume status looks good. However bedside echo in clinic today with EF 20-25%. Agree with increasing lisinopril. Continue LifeVest for now will likely need referral to EP soon for ICD. Labs today. Congratulated him on cessation of ETOH.

## 2012-06-11 NOTE — Assessment & Plan Note (Signed)
Continue NA meetings.

## 2012-06-15 ENCOUNTER — Encounter (HOSPITAL_COMMUNITY): Payer: Self-pay | Admitting: Emergency Medicine

## 2012-06-15 ENCOUNTER — Emergency Department (HOSPITAL_COMMUNITY)
Admission: EM | Admit: 2012-06-15 | Discharge: 2012-06-15 | Disposition: A | Discharge status: Home / Self Care | Payer: Medicaid Other | Attending: Emergency Medicine | Admitting: Emergency Medicine

## 2012-06-15 ENCOUNTER — Emergency Department (HOSPITAL_COMMUNITY): Payer: Medicaid Other

## 2012-06-15 DIAGNOSIS — Z79899 Other long term (current) drug therapy: Secondary | ICD-10-CM | POA: Insufficient documentation

## 2012-06-15 DIAGNOSIS — R0609 Other forms of dyspnea: Secondary | ICD-10-CM | POA: Insufficient documentation

## 2012-06-15 DIAGNOSIS — R079 Chest pain, unspecified: Secondary | ICD-10-CM | POA: Insufficient documentation

## 2012-06-15 DIAGNOSIS — E785 Hyperlipidemia, unspecified: Secondary | ICD-10-CM | POA: Insufficient documentation

## 2012-06-15 DIAGNOSIS — I509 Heart failure, unspecified: Secondary | ICD-10-CM | POA: Insufficient documentation

## 2012-06-15 DIAGNOSIS — R0989 Other specified symptoms and signs involving the circulatory and respiratory systems: Secondary | ICD-10-CM | POA: Insufficient documentation

## 2012-06-15 DIAGNOSIS — I10 Essential (primary) hypertension: Secondary | ICD-10-CM | POA: Insufficient documentation

## 2012-06-15 DIAGNOSIS — F17200 Nicotine dependence, unspecified, uncomplicated: Secondary | ICD-10-CM | POA: Insufficient documentation

## 2012-06-15 DIAGNOSIS — R197 Diarrhea, unspecified: Secondary | ICD-10-CM

## 2012-06-15 DIAGNOSIS — R0600 Dyspnea, unspecified: Secondary | ICD-10-CM

## 2012-06-15 HISTORY — DX: Heart failure, unspecified: I50.9

## 2012-06-15 LAB — CBC
HCT: 45.3 % (ref 39.0–52.0)
Hemoglobin: 15.8 g/dL (ref 13.0–17.0)
WBC: 5.4 10*3/uL (ref 4.0–10.5)

## 2012-06-15 LAB — BASIC METABOLIC PANEL
BUN: 18 mg/dL (ref 6–23)
CO2: 29 mEq/L (ref 19–32)
Chloride: 103 mEq/L (ref 96–112)
Glucose, Bld: 97 mg/dL (ref 70–99)
Potassium: 4.8 mEq/L (ref 3.5–5.1)

## 2012-06-15 LAB — POCT I-STAT TROPONIN I

## 2012-06-15 MED ORDER — METRONIDAZOLE 500 MG PO TABS
500.0000 mg | ORAL_TABLET | Freq: Once | ORAL | Status: AC
  Administered 2012-06-15: 500 mg via ORAL
  Filled 2012-06-15: qty 1

## 2012-06-15 MED ORDER — METRONIDAZOLE 500 MG PO TABS: 500.0000 mg | ORAL_TABLET | Freq: Three times a day (TID) | ORAL | Status: DC

## 2012-06-15 NOTE — ED Provider Notes (Signed)
History     CSN: [id number]  Arrival date & time 06/15/12  1543   First MD Initiated Contact with Patient 06/15/12 1616      Chief Complaint  Patient presents with  . Shortness of Breath    (Consider location/radiation/quality/duration/timing/severity/associated sxs/prior treatment) Patient is a 52 y.o. male presenting with shortness of breath. The history is provided by the patient.  Shortness of Breath Severity:  Mild Onset quality:  Gradual Duration:  1 day Timing:  Constant Progression:  Unchanged Chronicity:  Recurrent Context: activity   Relieved by:  Nothing Worsened by:  Activity Ineffective treatments:  None tried Associated symptoms: chest pain   Associated symptoms: no abdominal pain, no cough, no fever, no headaches, no neck pain, no PND, no sore throat and no vomiting   Chest pain:    Quality:  Aching   Severity:  Mild   Onset quality:  Gradual   Duration:  1 day   Timing:  Constant   Progression:  Unchanged   Chronicity:  New   Past Medical History  Diagnosis Date  . Hypertension   . Hyperlipidemia   . CHF (congestive heart failure)     Past Surgical History  Procedure Laterality Date  . Left second finger surgery      History reviewed. No pertinent family history.  History  Substance Use Topics  . Smoking status: Current Some Day Smoker -- 0.50 packs/day    Types: Cigarettes  . Smokeless tobacco: Never Used  . Alcohol Use: Yes     Comment: daily      Review of Systems  Constitutional: Negative for fever and chills.  HENT: Negative for congestion, sore throat, rhinorrhea and neck pain.   Respiratory: Positive for shortness of breath. Negative for cough and chest tightness.   Cardiovascular: Positive for chest pain. Negative for leg swelling and PND.  Gastrointestinal: Positive for diarrhea (loose stools since finishing flagyl for c diff about 6 days ago. ). Negative for nausea, vomiting, abdominal pain and constipation.   Genitourinary: Negative for dysuria.  Neurological: Negative for dizziness, weakness and headaches.  All other systems reviewed and are negative.    Allergies  Review of patient's allergies indicates no known allergies.  Home Medications   Current Outpatient Rx  Name  Route  Sig  Dispense  Refill  . atorvastatin (LIPITOR) 20 MG tablet   Oral   Take 10 mg by mouth daily.         . carvedilol (COREG) 6.25 MG tablet   Oral   Take 1 tablet (6.25 mg total) by mouth 2 (two) times daily with a meal.   30 tablet   0   . furosemide (LASIX) 40 MG tablet   Oral   Take 1 tablet (40 mg total) by mouth daily.   30 tablet   0   . lisinopril (PRINIVIL,ZESTRIL) 5 MG tablet   Oral   Take 1 tablet (5 mg total) by mouth 2 (two) times daily.   60 tablet   3   . potassium chloride SA (K-DUR,KLOR-CON) 20 MEQ tablet   Oral   Take 2 tablets (40 mEq total) by mouth daily.   90 tablet   3     Heart failure medication assistance program     BP 126/86  Pulse 100  Temp(Src) 98.1 F (36.7 C) (Oral)  Resp 16  Ht 6\' 1"  (1.854 m)  Wt 167 lb (75.751 kg)  BMI 22.04 kg/m2  SpO2 100%  Physical Exam  Nursing note and vitals reviewed. Constitutional: He is oriented to person, place, and time. He appears well-developed and well-nourished. No distress.  HENT:  Head: Normocephalic and atraumatic.  Right Ear: External ear normal.  Left Ear: External ear normal.  Mouth/Throat: Oropharynx is clear and moist.  Eyes: Pupils are equal, round, and reactive to light.  Neck: Normal range of motion. Neck supple.  Cardiovascular: Normal rate, regular rhythm, normal heart sounds and intact distal pulses.  Exam reveals no gallop and no friction rub.   No murmur heard. Pulmonary/Chest: Effort normal and breath sounds normal. No respiratory distress. He has no wheezes. He has no rales.  Abdominal: Soft. There is no tenderness. There is no rebound and no guarding.  Musculoskeletal: Normal range of  motion. He exhibits no edema and no tenderness.  Lymphadenopathy:    He has no cervical adenopathy.  Neurological: He is alert and oriented to person, place, and time.  Skin: Skin is warm and dry. No rash noted. No erythema.  Psychiatric: He has a normal mood and affect. His behavior is normal.    ED Course  Procedures (including critical care time)  Labs Reviewed  BASIC METABOLIC PANEL - Abnormal; Notable for the following:    GFR calc non Af Amer 72 (*)    GFR calc Af Amer 83 (*)    All other components within normal limits  PRO B NATRIURETIC PEPTIDE - Abnormal; Notable for the following:    Pro B Natriuretic peptide (BNP) 178.5 (*)    All other components within normal limits  CLOSTRIDIUM DIFFICILE BY PCR  CBC  POCT I-STAT TROPONIN I  POCT I-STAT TROPONIN I   Dg Chest 2 View  06/15/2012   *RADIOLOGY REPORT*  Clinical Data: Chest pain and short of breath.  CHEST - 2 VIEW  Comparison: 05/01/2012  Findings: Heart size is within normal limits and smaller compared to the prior study.  Congestive heart failure has resolved. Vascularity is now normal.  Lungs are free of infiltrate or effusion.  No edema or pneumonia.  8 mm right lower lobe nodule compatible with nipple shadow is unchanged.  IMPRESSION: Resolution of heart failure.  No acute abnormality is identified currently.   Original Report Authenticated By: Janeece Riggers, M.D.    Date: 06/15/2012  Rate: 75  Rhythm: normal sinus rhythm  QRS Axis: normal  Intervals: normal  ST/T Wave abnormalities: nonspecific ST changes  Conduction Disutrbances:none  Narrative Interpretation: LVH  Old EKG Reviewed: unchanged    1. Dyspnea   2. Diarrhea       MDM  4:45 PM 52yo male with h/o NICM with EF around 25% admitted on 4/29 with heart failure exacerbation and dc'd on flagyl which he completed 6 days ago here with dyspnea and central chest pain that started today along with worsening diarrhea that started soon after completing course  of flagyl. Vitals stable. No distress. Not hypoxic. CXR clear. Lungs CTAB. No peripheral edema. Denies orthopnea or PND. Initial trop neg.    6:39 PM labs show no significant abnormality. BNP is negative. Chest x-ray clear. Overall not consistent with congestive heart failure exacerbation. Given worsening diarrhea after stopping Flagyl will restart on Flagyl. He will likely require prolonged course. Patient given number to follow up with infectious disease. She voiced understanding. He was given strong return precautions, voiced understanding and discharged in stable condition.     Caren Hazy, MD 06/15/12 2257

## 2012-06-15 NOTE — ED Notes (Signed)
Patient presents to ED with c/o shortness of breath. Patient states he is out his meds but can not remember the name. Patient also states he has abdominal pain and diarrhea.

## 2012-06-16 LAB — CLOSTRIDIUM DIFFICILE BY PCR: Toxigenic C. Difficile by PCR: POSITIVE — AB

## 2012-06-16 NOTE — ED Provider Notes (Signed)
I saw and evaluated the patient, reviewed the resident's note and I agree with the findings and plan.  Agree with EKG interpretation if present.   Pt with recent admission for CHF has no evidence of same today, however he has had persistent diarrhea when taken off Flagyl is was on for pos c-diff. Will restart longer course and refer to ID for further management.   Charles B. Bernette Mayers, MD 06/16/12 [phone]

## 2012-06-22 ENCOUNTER — Emergency Department (HOSPITAL_COMMUNITY): Payer: Medicaid Other

## 2012-06-22 ENCOUNTER — Encounter (HOSPITAL_COMMUNITY): Payer: Self-pay | Admitting: Emergency Medicine

## 2012-06-22 ENCOUNTER — Emergency Department (HOSPITAL_COMMUNITY)
Admission: EM | Admit: 2012-06-22 | Discharge: 2012-06-22 | Disposition: A | Discharge status: Home / Self Care | Payer: Medicaid Other | Attending: Emergency Medicine | Admitting: Emergency Medicine

## 2012-06-22 DIAGNOSIS — F17200 Nicotine dependence, unspecified, uncomplicated: Secondary | ICD-10-CM | POA: Insufficient documentation

## 2012-06-22 DIAGNOSIS — I5022 Chronic systolic (congestive) heart failure: Secondary | ICD-10-CM | POA: Insufficient documentation

## 2012-06-22 DIAGNOSIS — F1410 Cocaine abuse, uncomplicated: Secondary | ICD-10-CM | POA: Insufficient documentation

## 2012-06-22 DIAGNOSIS — R0789 Other chest pain: Secondary | ICD-10-CM | POA: Insufficient documentation

## 2012-06-22 DIAGNOSIS — F1910 Other psychoactive substance abuse, uncomplicated: Secondary | ICD-10-CM | POA: Insufficient documentation

## 2012-06-22 DIAGNOSIS — E785 Hyperlipidemia, unspecified: Secondary | ICD-10-CM | POA: Insufficient documentation

## 2012-06-22 DIAGNOSIS — F1010 Alcohol abuse, uncomplicated: Secondary | ICD-10-CM | POA: Insufficient documentation

## 2012-06-22 DIAGNOSIS — Z79899 Other long term (current) drug therapy: Secondary | ICD-10-CM | POA: Insufficient documentation

## 2012-06-22 DIAGNOSIS — I10 Essential (primary) hypertension: Secondary | ICD-10-CM | POA: Insufficient documentation

## 2012-06-22 LAB — RAPID URINE DRUG SCREEN, HOSP PERFORMED
Amphetamines: NOT DETECTED
Barbiturates: NOT DETECTED
Benzodiazepines: NOT DETECTED
Cocaine: NOT DETECTED
Tetrahydrocannabinol: NOT DETECTED

## 2012-06-22 LAB — CBC
Platelets: 193 10*3/uL (ref 150–400)
RDW: 12.8 % (ref 11.5–15.5)
WBC: 6 10*3/uL (ref 4.0–10.5)

## 2012-06-22 LAB — COMPREHENSIVE METABOLIC PANEL
AST: 26 U/L (ref 0–37)
Albumin: 3.4 g/dL — ABNORMAL LOW (ref 3.5–5.2)
Alkaline Phosphatase: 47 U/L (ref 39–117)
Chloride: 103 mEq/L (ref 96–112)
Creatinine, Ser: 1.08 mg/dL (ref 0.50–1.35)
Potassium: 4.6 mEq/L (ref 3.5–5.1)
Total Bilirubin: 0.3 mg/dL (ref 0.3–1.2)

## 2012-06-22 MED ORDER — ONDANSETRON HCL 4 MG/2ML IJ SOLN
4.0000 mg | Freq: Once | INTRAMUSCULAR | Status: AC
  Administered 2012-06-22: 4 mg via INTRAVENOUS
  Filled 2012-06-22: qty 2

## 2012-06-22 NOTE — ED Notes (Signed)
Pt. Stated, I'm having chest pain about 3 hours ago. I've also had a little lightheartedness.

## 2012-06-22 NOTE — ED Provider Notes (Signed)
History     CSN: [id number] Arrival date & time 06/22/12  1255 First MD Initiated Contact with Patient 06/22/12 1342      Chief Complaint  Patient presents with  . Chest Pain   HPI  52 year old male with CHF, previously NYHA stage IV and EF 25%, history of substance and alcohol abuse who quit in April presenting with chest pain.   Patient states [place] when all of a sudden around 10-11am he started feeling Chest pain, slight abdominal pain, nausea, dizziness since 11 am came on suddenly. Chest pain described as throughout both sides of his chest and felt like a soreness. Had very mild shortness of breath during this time but still able to walk without difficulty. No diaphoresis or radiation to arm or leg. Said he got a sandwich but was nauseous so did not want to eat it. Not pleuritic. No cough. Denies recent orthopnea or PND.  No leg swelling. Denies recent weight gain. Denies headache. Denies vomiting. Frequent BM recently from C diff slowly improving-on flagyl.  Wearing his life vest without events.   Seen in ED a week ago for similar symptoms and had BNP <200 and normal CXR. Seen by HF clinic 1 week ago and had lisinopril increased to 5mg  BID.   Patient Active Problem List   Diagnosis Date Noted  . Chronic systolic heart failure 05/08/2012  . Polysubstance abuse 05/08/2012  . Alcohol abuse 05/01/2012  . Cocaine abuse 05/01/2012  . Acute systolic congestive heart failure, NYHA class 4 05/01/2012    Past Medical History  Diagnosis Date  . Hypertension   . Hyperlipidemia   . CHF (congestive heart failure)     Past Surgical History  Procedure Laterality Date  . Left second finger surgery      No family history on file.  History  Substance Use Topics  . Smoking status: Current Some Day Smoker -- 0.50 packs/day    Types: Cigarettes  . Smokeless tobacco: Never Used  . Alcohol Use: Yes     Comment: daily    Review of Systems A full 10  point review of symptoms was performed and was negative except as noted in HPI.   Allergies  Review of patient's allergies indicates no known allergies.  Home Medications   Current Outpatient Rx  Name  Route  Sig  Dispense  Refill  . atorvastatin (LIPITOR) 20 MG tablet   Oral   Take 10 mg by mouth daily.         . carvedilol (COREG) 6.25 MG tablet   Oral   Take 1 tablet (6.25 mg total) by mouth 2 (two) times daily with a meal.   30 tablet   0   . furosemide (LASIX) 40 MG tablet   Oral   Take 1 tablet (40 mg total) by mouth daily.   30 tablet   0   . lisinopril (PRINIVIL,ZESTRIL) 5 MG tablet   Oral   Take 1 tablet (5 mg total) by mouth 2 (two) times daily.   60 tablet   3   . metroNIDAZOLE (FLAGYL) 500 MG tablet   Oral   Take 500 mg by mouth 3 (three) times daily. Start date 06/15/12; Duration unknown to pt         . potassium chloride SA (K-DUR,KLOR-CON) 20 MEQ tablet   Oral   Take 2 tablets (40 mEq total) by mouth daily.   90 tablet   3  Heart failure medication assistance program     BP 100/60  Pulse 56  Temp(Src) 97.9 F (36.6 C) (Oral)  Resp 18  SpO2 100%  Physical Exam  Constitutional: He is oriented to person, place, and time. He appears well-developed and well-nourished. No distress.  HENT:  Head: Normocephalic and atraumatic.  Mouth/Throat: No oropharyngeal exudate.  Eyes: EOM are normal. Pupils are equal, round, and reactive to light.  Neck: Normal range of motion. Neck supple.  Cardiovascular: Normal rate, regular rhythm and intact distal pulses.  Exam reveals no gallop and no friction rub.   No murmur heard. No JVD.   Pulmonary/Chest: Effort normal and breath sounds normal. He has no wheezes. He has no rales. He exhibits tenderness (diffuse upper chest tenderness).  Abdominal: Soft. Bowel sounds are normal. There is no tenderness. There is no rebound and no guarding.  Musculoskeletal: Normal range of motion. He exhibits no edema.   Neurological: He is alert and oriented to person, place, and time. No cranial nerve deficit. Coordination normal.  Skin: Skin is warm and dry.     Date: 06/22/2012  Rate: 80   Rhythm: normal sinus rhythm  QRS Axis: normal  Intervals: normal  ST/T Wave abnormalities: nonspecific ST changes, inverted t wave ii, iii, avf, v4-v6  Conduction Disutrbances:none  Narrative Interpretation: LVH  Old EKG Reviewed: unchanged 06/15/12   ED Course  Procedures (including critical care time)  Labs Reviewed  COMPREHENSIVE METABOLIC PANEL - Abnormal; Notable for the following:    Glucose, Bld 109 (*)    Albumin 3.4 (*)    GFR calc non Af Amer 78 (*)    All other components within normal limits  CBC  URINE RAPID DRUG SCREEN (HOSP PERFORMED)  TROPONIN I  CG4 I-STAT (LACTIC ACID)   Dg Chest 2 View  06/22/2012   *RADIOLOGY REPORT*  Clinical Data: Chest pain  CHEST - 2 VIEW  Comparison: 06/15/2012  Findings: The heart and pulmonary vascularity are within normal limits.  Nipple shadows are noted bilaterally.  The lungs are clear.  No sizable effusion is noted.  No bony abnormality is seen.  IMPRESSION: No acute abnormality noted.   Original Report Authenticated By: Alcide Clever, M.D.   1. Musculoskeletal chest pain    MDM   52 year old male with CHF, previously NYHA stage IV and EF 25%, history of substance and alcohol abuse who quit in April presenting with chest pain similar to presentation 1 week ago.   Vitals stable. Patient not in distress. Pain down from 8 to 3-4 by time of discharge. Other symptoms had resolved.  Diffuse MSK tenderness-patient likely strained in some way. UDS negative. Troponin negative and EKG unchanged from previous-unlikely ACS. Wells score 0-unlikely PE.   No JVD, no edema, lungs clear on exam and CXR-does not appear fluid overloaded. If anything, BP slightly low but will not alter regimen currently (lisinopril recently increased) and encouraged close follow up with CHF  clinic.        Shelva Majestic, MD 06/22/12 [phone]    I performed a history and physical examination of  Chasen Mendell and discussed his management with Dr. Durene Cal. I agree with the history, physical, assessment, and plan of care, with the following exceptions: None I was present for the following procedures: None  Time Spent in Critical Care of the patient: None  Time spent in discussions with the patient and family: 10 minutes  Pt with advanced CHF, recently established care at [facility], found to  have NICM comes in with acute DIB. Pt feeling a bit better during my evalaution, no resp distress. Exam shows no pulm edema, fluid overload. No chest pains. BP at triage in the mid 80s - but improved on it;s own at my evaluation. No signs of infections, hx not suggestive of infection. Will get lactate - although cardiogenic shock, not suspected. CMP, CXR ordered as well. If all labs and trops are WNL - will discharge.    Date: 06/23/2012  Rate: 80  Rhythm: normal sinus rhythm  QRS Axis: normal  Intervals: normal  ST/T Wave abnormalities: nonspecific ST/T changes  Conduction Disutrbances:none  Narrative Interpretation:   Old EKG Reviewed: unchanged     Keyia Moretto   Derwood Kaplan, MD 06/23/12 [phone]

## 2012-06-29 ENCOUNTER — Emergency Department (HOSPITAL_COMMUNITY)
Admission: EM | Admit: 2012-06-29 | Discharge: 2012-06-29 | Disposition: A | Discharge status: Home / Self Care | Payer: Medicaid Other | Attending: Emergency Medicine | Admitting: Emergency Medicine

## 2012-06-29 ENCOUNTER — Emergency Department (HOSPITAL_COMMUNITY): Payer: Medicaid Other

## 2012-06-29 ENCOUNTER — Encounter (HOSPITAL_COMMUNITY): Payer: Self-pay

## 2012-06-29 DIAGNOSIS — R5381 Other malaise: Secondary | ICD-10-CM | POA: Insufficient documentation

## 2012-06-29 DIAGNOSIS — I5022 Chronic systolic (congestive) heart failure: Secondary | ICD-10-CM | POA: Insufficient documentation

## 2012-06-29 DIAGNOSIS — R0789 Other chest pain: Secondary | ICD-10-CM | POA: Insufficient documentation

## 2012-06-29 DIAGNOSIS — R079 Chest pain, unspecified: Secondary | ICD-10-CM

## 2012-06-29 DIAGNOSIS — R42 Dizziness and giddiness: Secondary | ICD-10-CM | POA: Insufficient documentation

## 2012-06-29 DIAGNOSIS — R0989 Other specified symptoms and signs involving the circulatory and respiratory systems: Secondary | ICD-10-CM | POA: Insufficient documentation

## 2012-06-29 DIAGNOSIS — Z792 Long term (current) use of antibiotics: Secondary | ICD-10-CM | POA: Insufficient documentation

## 2012-06-29 DIAGNOSIS — E785 Hyperlipidemia, unspecified: Secondary | ICD-10-CM | POA: Insufficient documentation

## 2012-06-29 DIAGNOSIS — I10 Essential (primary) hypertension: Secondary | ICD-10-CM | POA: Insufficient documentation

## 2012-06-29 DIAGNOSIS — R0600 Dyspnea, unspecified: Secondary | ICD-10-CM

## 2012-06-29 DIAGNOSIS — Z79899 Other long term (current) drug therapy: Secondary | ICD-10-CM | POA: Insufficient documentation

## 2012-06-29 DIAGNOSIS — R61 Generalized hyperhidrosis: Secondary | ICD-10-CM | POA: Insufficient documentation

## 2012-06-29 DIAGNOSIS — F17200 Nicotine dependence, unspecified, uncomplicated: Secondary | ICD-10-CM | POA: Insufficient documentation

## 2012-06-29 DIAGNOSIS — R5383 Other fatigue: Secondary | ICD-10-CM | POA: Insufficient documentation

## 2012-06-29 DIAGNOSIS — R0609 Other forms of dyspnea: Secondary | ICD-10-CM | POA: Insufficient documentation

## 2012-06-29 DIAGNOSIS — R110 Nausea: Secondary | ICD-10-CM | POA: Insufficient documentation

## 2012-06-29 LAB — PRO B NATRIURETIC PEPTIDE: Pro B Natriuretic peptide (BNP): 177.3 pg/mL — ABNORMAL HIGH (ref 0–125)

## 2012-06-29 LAB — COMPREHENSIVE METABOLIC PANEL
ALT: 45 U/L (ref 0–53)
Alkaline Phosphatase: 32 U/L — ABNORMAL LOW (ref 39–117)
BUN: 12 mg/dL (ref 6–23)
CO2: 24 mEq/L (ref 19–32)
Chloride: 102 mEq/L (ref 96–112)
GFR calc Af Amer: >90 mL/min (ref >90)
GFR calc non Af Amer: 80 mL/min — ABNORMAL LOW (ref >90)
Glucose, Bld: 138 mg/dL — ABNORMAL HIGH (ref 70–99)
Potassium: 5.4 mEq/L — ABNORMAL HIGH (ref 3.5–5.1)
Sodium: 137 mEq/L (ref 135–145)
Total Bilirubin: 0.3 mg/dL (ref 0.3–1.2)

## 2012-06-29 LAB — URINALYSIS, ROUTINE W REFLEX MICROSCOPIC
Glucose, UA: NEGATIVE mg/dL
Hgb urine dipstick: NEGATIVE
Ketones, ur: 15 mg/dL — AB
Nitrite: NEGATIVE
Protein, ur: NEGATIVE mg/dL
Specific Gravity, Urine: 1.023 (ref 1.005–1.030)
Urobilinogen, UA: 0.2 mg/dL (ref 0.0–1.0)
pH: 6 (ref 5.0–8.0)

## 2012-06-29 LAB — POCT I-STAT TROPONIN I: Troponin i, poc: 0.01 ng/mL (ref 0.00–0.08)

## 2012-06-29 LAB — CBC
HCT: 43.9 % (ref 39.0–52.0)
Hemoglobin: 15.6 g/dL (ref 13.0–17.0)
MCH: 31.1 pg (ref 26.0–34.0)
MCHC: 35.5 g/dL (ref 30.0–36.0)
MCV: 87.6 fL (ref 78.0–100.0)
Platelets: 184 10*3/uL (ref 150–400)
RBC: 5.01 MIL/uL (ref 4.22–5.81)
RDW: 13 % (ref 11.5–15.5)
WBC: 5.3 K/uL (ref 4.0–10.5)

## 2012-06-29 LAB — URINE MICROSCOPIC-ADD ON

## 2012-06-29 MED ORDER — NITROGLYCERIN 0.4 MG SL SUBL
0.4000 mg | SUBLINGUAL_TABLET | SUBLINGUAL | Status: DC | PRN
  Administered 2012-06-29: 0.4 mg via SUBLINGUAL
  Filled 2012-06-29: qty 25

## 2012-06-29 MED ORDER — ASPIRIN 81 MG PO CHEW
324.0000 mg | CHEWABLE_TABLET | Freq: Once | ORAL | Status: AC
  Administered 2012-06-29: 324 mg via ORAL
  Filled 2012-06-29: qty 4

## 2012-06-29 NOTE — ED Provider Notes (Signed)
History    CSN: [id number] Arrival date & time 06/29/12  1350  First MD Initiated Contact with Patient 06/29/12 1506     Chief Complaint  Patient presents with  . Shortness of Breath   (Consider location/radiation/quality/duration/timing/severity/associated sxs/prior Treatment) Patient is a 52 y.o. male presenting with chest pain. The history is provided by the patient.  Chest Pain Pain location:  Substernal area Pain quality: tightness   Pain radiates to:  Does not radiate Pain radiates to the back: no   Pain severity:  Moderate Onset quality:  Sudden Duration: pt states lasted 2-3 minutes and gradually improved.  Timing:  Constant Progression:  Improving Chronicity:  Recurrent Context comment:  [place] of [place].  Relieved by:  Nothing Worsened by:  Nothing tried Ineffective treatments:  None tried Associated symptoms: diaphoresis, dizziness, nausea, shortness of breath and weakness (generalized. )   Associated symptoms: no abdominal pain, no cough, no fever, no headache and not vomiting    Past Medical History  Diagnosis Date  . Hypertension   . Hyperlipidemia   . CHF (congestive heart failure)    Past Surgical History  Procedure Laterality Date  . Left second finger surgery     History reviewed. No pertinent family history. History  Substance Use Topics  . Smoking status: Current Some Day Smoker -- 0.50 packs/day    Types: Cigarettes  . Smokeless tobacco: Never Used  . Alcohol Use: Yes     Comment: daily    Review of Systems  Constitutional: Positive for diaphoresis. Negative for fever and chills.  HENT: Negative for congestion, rhinorrhea and neck pain.   Respiratory: Positive for shortness of breath. Negative for cough and chest tightness.   Cardiovascular: Positive for chest pain. Negative for leg swelling.  Gastrointestinal: Positive for nausea. Negative for vomiting, abdominal pain and diarrhea.  Genitourinary: Negative for  dysuria.  Neurological: Positive for dizziness, weakness (generalized. ) and light-headedness. Negative for headaches.  All other systems reviewed and are negative.    Allergies  Review of patient's allergies indicates no known allergies.  Home Medications   Current Outpatient Rx  Name  Route  Sig  Dispense  Refill  . atorvastatin (LIPITOR) 20 MG tablet   Oral   Take 20 mg by mouth daily.          . carvedilol (COREG) 6.25 MG tablet   Oral   Take 1 tablet (6.25 mg total) by mouth 2 (two) times daily with a meal.   30 tablet   0   . furosemide (LASIX) 40 MG tablet   Oral   Take 1 tablet (40 mg total) by mouth daily.   30 tablet   0   . lisinopril (PRINIVIL,ZESTRIL) 5 MG tablet   Oral   Take 1 tablet (5 mg total) by mouth 2 (two) times daily.   60 tablet   3   . metroNIDAZOLE (FLAGYL) 500 MG tablet   Oral   Take 500 mg by mouth 2 (two) times daily. Start date 06/15/12; Duration unknown to pt         . potassium chloride SA (K-DUR,KLOR-CON) 20 MEQ tablet   Oral   Take 2 tablets (40 mEq total) by mouth daily.   90 tablet   3     Heart failure medication assistance program    BP 107/79  Pulse 68  Temp(Src) 97.7 F (36.5 C) (Oral)  Resp 22  SpO2 100% Physical Exam  Nursing note and  vitals reviewed. Constitutional: He is oriented to person, place, and time. He appears well-developed and well-nourished. No distress.  HENT:  Head: Normocephalic and atraumatic.  Right Ear: External ear normal.  Left Ear: External ear normal.  Mouth/Throat: Oropharynx is clear and moist.  Eyes: Pupils are equal, round, and reactive to light.  Neck: Normal range of motion. Neck supple.  Cardiovascular: Normal rate, regular rhythm, normal heart sounds and intact distal pulses.  Exam reveals no gallop and no friction rub.   No murmur heard. Pulmonary/Chest: Effort normal and breath sounds normal. No respiratory distress. He has no wheezes. He has no rales.  Abdominal: Soft.  There is no tenderness. There is no rebound and no guarding.  Musculoskeletal: Normal range of motion. He exhibits no edema and no tenderness.  Lymphadenopathy:    He has no cervical adenopathy.  Neurological: He is alert and oriented to person, place, and time.  Skin: Skin is warm and dry. No rash noted. No erythema.  Psychiatric: He has a normal mood and affect. His behavior is normal.    ED Course  Procedures (including critical care time) Labs Reviewed  URINALYSIS, ROUTINE W REFLEX MICROSCOPIC - Abnormal; Notable for the following:    APPearance HAZY (*)    Bilirubin Urine SMALL (*)    Ketones, ur 15 (*)    Leukocytes, UA SMALL (*)    All other components within normal limits  PRO B NATRIURETIC PEPTIDE - Abnormal; Notable for the following:    Pro B Natriuretic peptide (BNP) 177.3 (*)    All other components within normal limits  URINE MICROSCOPIC-ADD ON - Abnormal; Notable for the following:    Squamous Epithelial / LPF FEW (*)    All other components within normal limits  CBC  COMPREHENSIVE METABOLIC PANEL  POCT I-STAT TROPONIN I  POCT I-STAT TROPONIN I   Date: 06/29/2012  Rate: 80  Rhythm: normal sinus rhythm  QRS Axis: normal  Intervals: normal  ST/T Wave abnormalities: T wave inversions in inferior and lateral leads  Conduction Disutrbances:none  Narrative Interpretation: LVH  Old EKG Reviewed: unchanged   Dg Chest 2 View  06/29/2012   *RADIOLOGY REPORT*  Clinical Data: Shortness of breath, chest pain, weakness and diaphoresis.  CHEST - 2 VIEW  Comparison: 06/22/2012 and 05/01/2012.  Findings: Trachea is midline.  Heart is at the upper limits of normal in size to mildly enlarged.  Nipple shadows project over the lower hemithoraces.  Lungs are clear.  No pleural fluid.  IMPRESSION: No acute findings.   Original Report Authenticated By: Leanna Battles, M.D.   1. Chest pain   2. Dyspnea   3. Lightheadedness   4. Chronic systolic heart failure     MDM  55:75 PM  52 year old male history of nonischemic cardiomyopathy with EF around 30% here with sudden onset chest tightness, shortness of breath, nausea and lightheadedness that occurred after eating at [facility]. Patient was seen here recently with similar symptoms after eating at [facility]. Patient states symptoms are overall improved, now complaining only of shortness of breath. Vital signs are stable. Lungs are clear, no peripheral edema and exam not consistent with heart failure exacerbation. We'll check labs, EKG, chest x-ray troponin and BNP.  6:05 PM labs show no abnormality. BMP unremarkable. Overall picture is not consistent with congestive heart other exacerbation or ACS. He has followup with his cardiologist in 4 days. He will followup as scheduled for reevaluation. Discussed with patient and he is understanding of plan. Patient given strong  return precautions and discharged home in stable condition.  Caren Hazy, MD 06/30/12 [phone]

## 2012-06-29 NOTE — ED Notes (Signed)
Pt presents with sudden onset of chest tightness, shortness of breath and dizziness after eating at [facility].  Pt reports he went outside with onset of symptoms, went back inside to cool off but reports symptoms continue.  Pt is wearing holtor monitor for bradycardia since May.

## 2012-06-29 NOTE — ED Provider Notes (Signed)
I saw and evaluated the patient, reviewed the resident's note and I agree with the findings and plan.  I reviewed and agree with ECG interpretation by resident physician.  Pt with dyspnea, transient, recurrent after eating today. No current CP.  Symptoms seem to be improving.  Pt with h/o cardiomyopathy.  Will check labs including troponin, no new acute ischemic changes on ECG.  Will get CXR and monitor in the ED.  If symptoms improve as possibly pt had mild CHF exacerbation, likely can be discharged to home to follow up with cardiologist.     Impression: Dyspnea H/o CHF   Gavin Pound. Tamara Monteith, MD 06/29/12 1610

## 2012-07-03 ENCOUNTER — Encounter: Payer: Self-pay | Admitting: Internal Medicine

## 2012-07-03 ENCOUNTER — Encounter (HOSPITAL_COMMUNITY): Payer: Self-pay

## 2012-07-03 ENCOUNTER — Ambulatory Visit (HOSPITAL_COMMUNITY)
Admission: RE | Admit: 2012-07-03 | Discharge: 2012-07-03 | Disposition: A | Discharge status: Home / Self Care | Payer: Medicaid Other | Source: Ambulatory Visit | Attending: Internal Medicine | Admitting: Internal Medicine

## 2012-07-03 VITALS — BP 119/75 | HR 76 | Wt 170.8 lb

## 2012-07-03 DIAGNOSIS — I5022 Chronic systolic (congestive) heart failure: Secondary | ICD-10-CM

## 2012-07-03 DIAGNOSIS — I428 Other cardiomyopathies: Secondary | ICD-10-CM | POA: Insufficient documentation

## 2012-07-03 DIAGNOSIS — F1010 Alcohol abuse, uncomplicated: Secondary | ICD-10-CM | POA: Insufficient documentation

## 2012-07-03 DIAGNOSIS — I10 Essential (primary) hypertension: Secondary | ICD-10-CM | POA: Insufficient documentation

## 2012-07-03 DIAGNOSIS — F17200 Nicotine dependence, unspecified, uncomplicated: Secondary | ICD-10-CM | POA: Insufficient documentation

## 2012-07-03 DIAGNOSIS — Z79899 Other long term (current) drug therapy: Secondary | ICD-10-CM | POA: Insufficient documentation

## 2012-07-03 DIAGNOSIS — F1410 Cocaine abuse, uncomplicated: Secondary | ICD-10-CM | POA: Insufficient documentation

## 2012-07-03 DIAGNOSIS — I509 Heart failure, unspecified: Secondary | ICD-10-CM | POA: Insufficient documentation

## 2012-07-03 DIAGNOSIS — E785 Hyperlipidemia, unspecified: Secondary | ICD-10-CM | POA: Insufficient documentation

## 2012-07-03 MED ORDER — LOSARTAN POTASSIUM 25 MG PO TABS: 25.0000 mg | ORAL_TABLET | Freq: Every day | ORAL | Status: DC

## 2012-07-03 MED ORDER — ATORVASTATIN CALCIUM 20 MG PO TABS: 20.0000 mg | ORAL_TABLET | Freq: Every day | ORAL | Status: DC

## 2012-07-03 MED ORDER — CARVEDILOL 6.25 MG PO TABS: 6.2500 mg | ORAL_TABLET | Freq: Two times a day (BID) | ORAL | Status: DC

## 2012-07-03 MED ORDER — DIGOXIN 250 MCG PO TABS: 0.2500 mg | ORAL_TABLET | Freq: Every day | ORAL | Status: DC

## 2012-07-03 MED ORDER — SPIRONOLACTONE 25 MG PO TABS: 25.0000 mg | ORAL_TABLET | Freq: Every day | ORAL | Status: DC

## 2012-07-03 NOTE — Assessment & Plan Note (Signed)
Reviewed ED notes from the last 3 visits. Also reviewed most recent Lab work. NYHA IIIb. Mild volume overload but he has not had his lasix today. Continue current diuretic regimen. Will not increase beta blocker due to mild volume. Stop lisinopril due to cough and start losartan 25 mg daily. Stop potassium as K was 5.4. BMET and Pro BNP obtained today from [employer]. Continue life vest. Reinforced daily weights and medication compliance. I have asked him to call HF clinic directly if he develops dyspnea and he verbalized understanding. I have sent all HF medications to [facility] pharmacy through the HF assistance fund. Follow up in 2 weeks with an ECHO. If EF remains <35% will need referral for ICD.

## 2012-07-03 NOTE — Patient Instructions (Addendum)
Follow up in 2 weeks with an ECHO  Stop potassium   Stop lisinopril  Start losartan 25 mg daily  Do the following things EVERYDAY: 1) Weigh yourself in the morning before breakfast. Write it down and keep it in a log. 2) Take your medicines as prescribed 3) Eat low salt foods-Limit salt (sodium) to 2000 mg per day.  4) Stay as active as you can everyday 5) Limit all fluids for the day to less than 2 liters

## 2012-07-03 NOTE — Progress Notes (Signed)
Patient ID: Charles Cervantes, male   DOB: Dec 07, 1960, 52 y.o.   MRN: [id number]   Weight Range   Baseline proBNP   2656 on 05/01/12  [employer] Study  HPI: Charles Cervantes is a 52 yo AAM with recent diagnosis of systolic heart failure secondary to NICM, EF 20-25%. As well as polysubstance abuse (cocaine, tobacco and alcohol).  New diagnosis of systolic HF in [place] in 04/2012.  He was discharged with Rx for IV lasix, lisinopril and spiro.  He was unable to afford IV lasix and therefore had no fluid pill for ~1 week.  He has moved from [facility] and admitted to [facility] on 05/01/12 with progressive dyspnea and orthopnea.  Echo on admit showed EF 20-25% with normal RV.  Discharge weight 147 pounds.  He also had NSVT while in house and will be continued on Lifevest placed in [place].    He has gone to  [facility] ED 06/15/12, 06/2012, and 06/29/12 for diarrhea and increased dyspnea. Evaluated each time and later sent home.   6.27.14 Potassium 5.4 Creatinine 1.05  He returns for follow up today. Last visit lisinopril increase to 5 mg bid.  Complains of a dry cough. Denies  SOB/PND/Orthopnea. Does admit to dyspnea with exertion. Wearing lifevest.  He remains drug free. He continues to attend rehab meetings.He has been clean since 04/27/12.  He is unable to pay for his medications.     ROS: All systems negative except as listed in HPI, PMH and Problem List.  Past Medical History  Diagnosis Date  . Hypertension   . Hyperlipidemia   . CHF (congestive heart failure)     Current Outpatient Prescriptions  Medication Sig Dispense Refill  . atorvastatin (LIPITOR) 20 MG tablet Take 20 mg by mouth daily.       . carvedilol (COREG) 6.25 MG tablet Take 1 tablet (6.25 mg total) by mouth 2 (two) times daily with a meal.  30 tablet  0  . digoxin (LANOXIN) 0.25 MG tablet Take 0.25 mg by mouth daily.      . furosemide (LASIX) 40 MG tablet Take 1 tablet (40 mg total) by mouth daily.  30 tablet  0  . lisinopril (PRINIVIL,ZESTRIL) 5 MG tablet Take  1 tablet (5 mg total) by mouth 2 (two) times daily.  60 tablet  3  . metroNIDAZOLE (FLAGYL) 500 MG tablet Take 500 mg by mouth 2 (two) times daily. Start date 06/15/12; Duration unknown to pt      . potassium chloride SA (K-DUR,KLOR-CON) 20 MEQ tablet Take 2 tablets (40 mEq total) by mouth daily.  90 tablet  3  . spironolactone (ALDACTONE) 25 MG tablet Take 25 mg by mouth daily.       No current facility-administered medications for this encounter.     PHYSICAL EXAM: Filed Vitals:   07/03/12 1248  BP: 119/75  Pulse: 76  Weight: 170 lb 12.8 oz (77.474 kg)  SpO2: 100%    General:  Well appearing. No resp difficulty HEENT: normal Neck: supple. JVP 6-7. Carotids 2+ bilaterally; no bruits. No lymphadenopathy or thryomegaly appreciated. Cor: PMI normal. Regular rate & rhythm. No rubs, or murmurs. No gallop Life vest in place Lungs: clear Abdomen: soft, nontender, mild distention. No hepatosplenomegaly. No bruits or masses. Good bowel sounds. Extremities: no cyanosis, clubbing, rash, R and LLE 1+ edema Neuro: alert & orientedx3, cranial nerves grossly intact. Moves all 4 extremities w/o difficulty. Affect pleasant.   ASSESSMENT & PLAN:

## 2012-07-03 NOTE — Assessment & Plan Note (Signed)
Remains alcohol free. Attending AA meetings.

## 2012-07-12 NOTE — Telephone Encounter (Signed)
erroneous

## 2012-07-16 ENCOUNTER — Encounter (HOSPITAL_COMMUNITY): Payer: Self-pay | Admitting: *Deleted

## 2012-07-16 ENCOUNTER — Emergency Department (HOSPITAL_COMMUNITY)
Admission: EM | Admit: 2012-07-16 | Discharge: 2012-07-16 | Disposition: A | Discharge status: Home / Self Care | Payer: Medicaid Other | Attending: Emergency Medicine | Admitting: Emergency Medicine

## 2012-07-16 DIAGNOSIS — Y998 Other external cause status: Secondary | ICD-10-CM | POA: Insufficient documentation

## 2012-07-16 DIAGNOSIS — E785 Hyperlipidemia, unspecified: Secondary | ICD-10-CM | POA: Insufficient documentation

## 2012-07-16 DIAGNOSIS — Y939 Activity, unspecified: Secondary | ICD-10-CM | POA: Insufficient documentation

## 2012-07-16 DIAGNOSIS — I10 Essential (primary) hypertension: Secondary | ICD-10-CM | POA: Insufficient documentation

## 2012-07-16 DIAGNOSIS — M545 Low back pain, unspecified: Secondary | ICD-10-CM | POA: Insufficient documentation

## 2012-07-16 DIAGNOSIS — M549 Dorsalgia, unspecified: Secondary | ICD-10-CM

## 2012-07-16 DIAGNOSIS — I509 Heart failure, unspecified: Secondary | ICD-10-CM | POA: Insufficient documentation

## 2012-07-16 DIAGNOSIS — F17200 Nicotine dependence, unspecified, uncomplicated: Secondary | ICD-10-CM | POA: Insufficient documentation

## 2012-07-16 DIAGNOSIS — I498 Other specified cardiac arrhythmias: Secondary | ICD-10-CM | POA: Insufficient documentation

## 2012-07-16 DIAGNOSIS — Y92410 Unspecified street and highway as the place of occurrence of the external cause: Secondary | ICD-10-CM | POA: Insufficient documentation

## 2012-07-16 DIAGNOSIS — Z2089 Contact with and (suspected) exposure to other communicable diseases: Secondary | ICD-10-CM | POA: Insufficient documentation

## 2012-07-16 DIAGNOSIS — R197 Diarrhea, unspecified: Secondary | ICD-10-CM | POA: Insufficient documentation

## 2012-07-16 DIAGNOSIS — Z79899 Other long term (current) drug therapy: Secondary | ICD-10-CM | POA: Insufficient documentation

## 2012-07-16 MED ORDER — METRONIDAZOLE 500 MG PO TABS
500.0000 mg | ORAL_TABLET | Freq: Once | ORAL | Status: AC
  Administered 2012-07-16: 500 mg via ORAL
  Filled 2012-07-16: qty 1

## 2012-07-16 MED ORDER — NAPROXEN 500 MG PO TABS: 500.0000 mg | ORAL_TABLET | Freq: Two times a day (BID) | ORAL | Status: DC

## 2012-07-16 MED ORDER — METRONIDAZOLE 500 MG PO TABS: 750.0000 mg | ORAL_TABLET | Freq: Three times a day (TID) | ORAL | Status: DC

## 2012-07-16 MED ORDER — NAPROXEN 250 MG PO TABS
500.0000 mg | ORAL_TABLET | Freq: Once | ORAL | Status: AC
  Administered 2012-07-16: 500 mg via ORAL
  Filled 2012-07-16: qty 2
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
## 2012-07-16 NOTE — Progress Notes (Signed)
Late note entry- Received verbal telephone case manager consult from patients nurse-RN-Kuch for medication assistance.Verbalized to patients RN-case manager would provide a consult in  45- 60 minutes. Patient not in ED-CD07C- waiting area,lobby area checked.Verbalized to Triage RN if patient presented to lobby to call case manager phone.Patients RN informed patient was  Not In ED area. Epic note from May 2014- states patient received MATCH medication help in 05/2012.Patients RN informed of this.
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
## 2012-07-16 NOTE — Progress Notes (Deleted)
Late Note- Received verbal case manager consult from RN Kuch for medication assistance for patient.Verbalized to Patients RN- case manager would provide a consult in  45-60 minutes. Patient not in ED-CDO7C. Waiting area, lobby area checked.Patients RN informed that patient was not in ED area.Epic note from May 2015- states patient received MATCH medication help In May 2015.Patients RN contacted by this case manager and updated with this  Information. Left message with Triage nurse RN-that if patient returns to  Call ED manager to address patient Needs.

## 2012-07-17 ENCOUNTER — Ambulatory Visit (HOSPITAL_COMMUNITY)
Admission: RE | Admit: 2012-07-17 | Discharge: 2012-07-17 | Disposition: A | Discharge status: Home / Self Care | Payer: Medicaid Other | Source: Ambulatory Visit | Attending: Internal Medicine | Admitting: Internal Medicine

## 2012-07-17 ENCOUNTER — Ambulatory Visit (HOSPITAL_BASED_OUTPATIENT_CLINIC_OR_DEPARTMENT_OTHER)
Admission: RE | Admit: 2012-07-17 | Discharge: 2012-07-17 | Disposition: A | Discharge status: Home / Self Care | Payer: Medicaid Other | Source: Ambulatory Visit | Attending: Internal Medicine | Admitting: Internal Medicine

## 2012-07-17 VITALS — BP 136/84 | HR 50 | Wt 179.5 lb

## 2012-07-17 DIAGNOSIS — I509 Heart failure, unspecified: Secondary | ICD-10-CM | POA: Insufficient documentation

## 2012-07-17 DIAGNOSIS — F1010 Alcohol abuse, uncomplicated: Secondary | ICD-10-CM | POA: Insufficient documentation

## 2012-07-17 DIAGNOSIS — A0472 Enterocolitis due to Clostridium difficile, not specified as recurrent: Secondary | ICD-10-CM | POA: Insufficient documentation

## 2012-07-17 DIAGNOSIS — Z79899 Other long term (current) drug therapy: Secondary | ICD-10-CM | POA: Insufficient documentation

## 2012-07-17 DIAGNOSIS — I517 Cardiomegaly: Secondary | ICD-10-CM

## 2012-07-17 DIAGNOSIS — F1910 Other psychoactive substance abuse, uncomplicated: Secondary | ICD-10-CM | POA: Insufficient documentation

## 2012-07-17 DIAGNOSIS — F17200 Nicotine dependence, unspecified, uncomplicated: Secondary | ICD-10-CM | POA: Insufficient documentation

## 2012-07-17 DIAGNOSIS — I10 Essential (primary) hypertension: Secondary | ICD-10-CM | POA: Insufficient documentation

## 2012-07-17 DIAGNOSIS — I5022 Chronic systolic (congestive) heart failure: Secondary | ICD-10-CM | POA: Insufficient documentation

## 2012-07-17 DIAGNOSIS — I428 Other cardiomyopathies: Secondary | ICD-10-CM | POA: Insufficient documentation

## 2012-07-17 DIAGNOSIS — E785 Hyperlipidemia, unspecified: Secondary | ICD-10-CM | POA: Insufficient documentation

## 2012-07-17 LAB — DIGOXIN LEVEL: Digoxin Level: 0.5 ng/mL — ABNORMAL LOW (ref 0.8–2.0)

## 2012-07-17 MED ORDER — CARVEDILOL 6.25 MG PO TABS: 9.3750 mg | ORAL_TABLET | Freq: Two times a day (BID) | ORAL | Status: DC
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
## 2012-07-17 NOTE — Patient Instructions (Addendum)
Increase carvedilol to 9.375 mg (1 1/2 tablets) two times a day.  Continue to wear Life Vest.  Do not take Naproxen instead take Tylenol for back pain.   Will call with lab results.  Follow up in 3 weeks

## 2012-07-17 NOTE — Progress Notes (Signed)
  Echocardiogram 2D Echocardiogram has been performed.  Charles Cervantes 07/17/2012, 10:45 AM

## 2012-07-19 ENCOUNTER — Encounter (HOSPITAL_COMMUNITY): Payer: Self-pay | Admitting: Anesthesiology

## 2012-07-19 NOTE — Progress Notes (Unsigned)
Patient ID: Charles Cervantes, male   DOB: [birth year], 52 y.o.   MRN: [id number]  Patient stopped into clinic today reporting he felt awful and felt like he was going to pass out. He said he went to pick up his medications for his C-diff this am and went to there wrong pharmacy and then had to walk 3 miles to the next pharmacy to get them. Reports that his LifeVest was beeping at him, but he pressed the button not to give him a shock. Brought him into clinic room and BP 98/50, on Tuesday was 136/84. Had him lay down on clinic bed for a few minutes to rest. Called LifeVest to interrogate device however patient has to be at home with the equipment to do this. Patient reports also that he took two pills this am of his carvedilol (12.5 mg) instead of 1.5 (9.375 mg). After he rest for a few minutes he started feeling better. Reinstructed patient to not exert himself that much and what medications he needs to be taking and the dosages. Have asked him to go home and transmit to [facility] and to come in later next week with all of his pills in order for [place] to fill his pill box for him. He reports that he understood the directions and that he appreciated the help. Also took his naproxen from medications he filled because he said he would forget and would take them. Reiterated the need to not take NSAIDs but to use Tylenol.   Ulla Potash B 3:48 PM

## 2012-08-01 ENCOUNTER — Encounter (HOSPITAL_COMMUNITY): Payer: Self-pay | Admitting: Internal Medicine

## 2012-08-01 ENCOUNTER — Ambulatory Visit (HOSPITAL_COMMUNITY)
Admission: RE | Admit: 2012-08-01 | Discharge: 2012-08-01 | Disposition: A | Discharge status: Home / Self Care | Payer: Medicaid Other | Source: Ambulatory Visit | Attending: Internal Medicine | Admitting: Internal Medicine

## 2012-08-01 VITALS — BP 119/91 | HR 66 | Wt 171.0 lb

## 2012-08-01 DIAGNOSIS — Z79899 Other long term (current) drug therapy: Secondary | ICD-10-CM | POA: Insufficient documentation

## 2012-08-01 DIAGNOSIS — A0472 Enterocolitis due to Clostridium difficile, not specified as recurrent: Secondary | ICD-10-CM

## 2012-08-01 DIAGNOSIS — E785 Hyperlipidemia, unspecified: Secondary | ICD-10-CM | POA: Insufficient documentation

## 2012-08-01 DIAGNOSIS — A0471 Enterocolitis due to Clostridium difficile, recurrent: Secondary | ICD-10-CM | POA: Insufficient documentation

## 2012-08-01 DIAGNOSIS — I509 Heart failure, unspecified: Secondary | ICD-10-CM | POA: Insufficient documentation

## 2012-08-01 DIAGNOSIS — I10 Essential (primary) hypertension: Secondary | ICD-10-CM | POA: Insufficient documentation

## 2012-08-01 DIAGNOSIS — I5022 Chronic systolic (congestive) heart failure: Secondary | ICD-10-CM

## 2012-08-01 DIAGNOSIS — F1910 Other psychoactive substance abuse, uncomplicated: Secondary | ICD-10-CM

## 2012-08-01 DIAGNOSIS — F1021 Alcohol dependence, in remission: Secondary | ICD-10-CM | POA: Insufficient documentation

## 2012-08-01 MED ORDER — LOSARTAN POTASSIUM 50 MG PO TABS: 50.0000 mg | ORAL_TABLET | Freq: Every day | ORAL | Status: DC

## 2012-08-01 NOTE — Progress Notes (Signed)
Patient ID: Charles Cervantes, male   DOB: Sep 02, 1960, 52 y.o.   MRN: [id number]   Weight Range   Baseline proBNP   2656 on 05/01/12  Guide IT Study  HPI: Charles Cervantes is a 52 yo AAM with recent diagnosis of systolic heart failure secondary to NICM, EF 20-25%. As well as polysubstance abuse (cocaine, tobacco and alcohol). Treated for C-Diff in July 2014. (He is not on an Ace-I due to cough)   New diagnosis of systolic HF in [place] in 04/2012.  He was discharged with Rx for IV lasix, lisinopril and spiro.  He was unable to afford IV lasix and therefore had no fluid pill for ~1 week.  He has moved from [facility] and admitted to [facility] on 05/01/12 with progressive dyspnea and orthopnea.  Echo on admit showed EF 20-25% with normal RV.  Discharge weight 147 pounds.  He also had NSVT while in house and will be continued on Lifevest placed in [place].    ECHO  04/2012 EF 20-25% 07/17/12 EF 30-35%  Labs 06/29/12 Potassium 5.4 Creatinine 1.05 07/17/12 Dig Level 0.5 Potassium 4.0 Creatinine 0.95 Pro BNP 244  He returns for follow up. Last visit carvedilol increased to 9.375 mg twice a day. Denies Orthopnea/PND. Complaining of fatigue. Able to walk 15 minutes on flat surfaces. Complains of dyspnea on inclined surfaces.  Mild dyspnea going up steps. Weight at home 172-173 pounds. Taking all medications. He is currently being treated for C-Diff.     ROS: All systems negative except as listed in HPI, PMH and Problem List.  Past Medical History  Diagnosis Date  . Hypertension   . Hyperlipidemia   . CHF (congestive heart failure)   . C. difficile diarrhea     Current Outpatient Prescriptions  Medication Sig Dispense Refill  . atorvastatin (LIPITOR) 20 MG tablet Take 1 tablet (20 mg total) by mouth daily.  30 tablet  3  . carvedilol (COREG) 6.25 MG tablet Take 1.5 tablets (9.375 mg total) by mouth 2 (two) times daily with a meal.  30 tablet  0  . digoxin (LANOXIN) 0.25 MG tablet Take 1 tablet (0.25 mg total) by mouth  daily.  30 tablet  3  . furosemide (LASIX) 40 MG tablet Take 1 tablet (40 mg total) by mouth daily.  30 tablet  0  . losartan (COZAAR) 25 MG tablet Take 1 tablet (25 mg total) by mouth daily.  30 tablet  3  . metroNIDAZOLE (FLAGYL) 500 MG tablet Take 1.5 tablets (750 mg total) by mouth 3 (three) times daily.  42 tablet  0  . spironolactone (ALDACTONE) 25 MG tablet Take 1 tablet (25 mg total) by mouth daily.  30 tablet  3   No current facility-administered medications for this encounter.     PHYSICAL EXAM: Filed Vitals:   08/01/12 1107  BP: 119/91  Pulse: 66  Weight: 171 lb (77.565 kg)  SpO2: 100%    General:  Well appearing. No resp difficulty HEENT: normal Neck: supple. JVP flat. Carotids 2+ bilaterally; no bruits. No lymphadenopathy or thryomegaly appreciated. Cor: PMI normal. Regular rate & rhythm. No rubs, or murmurs. No gallop Life vest in place Lungs: clear Abdomen: soft, nontender, mild distention. No hepatosplenomegaly. No bruits or masses. Good bowel sounds. Extremities: no cyanosis, clubbing, rash,  edema Neuro: alert & orientedx3, cranial nerves grossly intact. Moves all 4 extremities w/o difficulty. Affect pleasant.   ASSESSMENT & PLAN:  1) Chronic systolic HF, EF 30-35%.   NYHA IIIB. Ongoing dyspnea  with exertion. Nonischemic, suspect secondary to ETOH/cocaine.  Volume status stable. Continue lasix 40 mg daily and 25 mg spironolactone daily.  Carvedilol 9.375 mg twice a day. Will not increase due to fatigue.  Reviewed lab work from 07/17/12 Potassium 4.0. Increase losartan to 50 mg daily. (He is not on an Ace-I due to cough) Continue Life Vest- no alarms. Will need repeat ECHO soon.  May need CPX at some point.  Check BMET and Pro-BNP today Reinforced daily weights and medication compliance. Provided HF meds today through HF indigent program.      2) Polysubstance abuse        Congratulated on remaining drug free. Continue AA meetings and substance abuse classes.    3) Recurrent Colitis/C-diff- continue flagyl and continue to follow up with PCP.  Follow up in 2  weeks.   [name] NP-C 11:16 AM

## 2012-08-01 NOTE — Patient Instructions (Addendum)
Please increase Losartan to 50 mg a day Continue all other medications as listed  Follow up in 2 weeks.  Parking code is [employer]

## 2012-08-10 ENCOUNTER — Ambulatory Visit (HOSPITAL_COMMUNITY)
Admission: RE | Admit: 2012-08-10 | Discharge: 2012-08-10 | Disposition: A | Discharge status: Home / Self Care | Payer: Medicaid Other | Source: Ambulatory Visit | Attending: Internal Medicine | Admitting: Internal Medicine

## 2012-08-10 ENCOUNTER — Encounter (HOSPITAL_COMMUNITY): Payer: Self-pay

## 2012-08-10 VITALS — BP 126/84 | HR 56 | Wt 172.0 lb

## 2012-08-10 DIAGNOSIS — I5022 Chronic systolic (congestive) heart failure: Secondary | ICD-10-CM

## 2012-08-10 DIAGNOSIS — F1010 Alcohol abuse, uncomplicated: Secondary | ICD-10-CM

## 2012-08-10 MED ORDER — CARVEDILOL 6.25 MG PO TABS: ORAL_TABLET | ORAL | Status: DC

## 2012-08-10 NOTE — Progress Notes (Deleted)
Patient ID: Charles Cervantes, male   DOB: Dec 24, 1960, 52 y.o.   MRN: [id number]  Patient stopped into clinic today reporting he felt awful and felt like he was going to pass out. He said he went to pick up his medications for his C-diff this am and went to there wrong pharmacy and then had to walk 3 miles to the next pharmacy to get them. Reports that his LifeVest was beeping at him, but he pressed the button not to give him a shock. Brought him into clinic room and BP 98/50, on Tuesday was 136/84. Had him lay down on clinic bed for a few minutes to rest. Called LifeVest to interrogate device however patient has to be at home with the equipment to do this. Patient reports also that he took two pills this am of his carvedilol (12.5 mg) instead of 1.5 (9.375 mg). After he rest for a few minutes he started feeling better. Reinstructed patient to not exert himself that much and what medications he needs to be taking and the dosages. Have asked him to go home and transmit to [facility] and to come in later next week with all of his pills in order for [place] to fill his pill box for him. He reports that he understood the directions and that he appreciated the help. Also took his naproxen from medications he filled because he said he would forget and would take them. Reiterated the need to not take NSAIDs but to use Tylenol.   Ulla Potash B 10:06 AM

## 2012-08-10 NOTE — Patient Instructions (Addendum)
Increase carvedilol to 9.375 mg (1 1/2 tablets) in the am and 12.5 mg (2 tablets) in the pm.  Will refer to the EP doctors.  Follow up 2 weeks.

## 2012-08-10 NOTE — Progress Notes (Signed)
Patient ID: Charles Cervantes, male   DOB: 09/12/1960, 52 y.o.   MRN: [id number]  Weight Range   Baseline proBNP   2656 on 05/01/12  Guide IT Study  HPI: [name] is a 52 yo AAM with recent diagnosis of systolic heart failure secondary to NICM, EF 20-25%. As well as polysubstance abuse (cocaine, tobacco and alcohol). Treated for C-Diff in July 2014. (He is not on an Ace-I due to cough)   New diagnosis of systolic HF in [place] in 04/2012.  He was discharged with Rx for IV lasix, lisinopril and spiro.  He was unable to afford IV lasix and therefore had no fluid pill for ~1 week.  He has moved from [facility] and admitted to [facility] on 05/01/12 with progressive dyspnea and orthopnea.  Echo on admit showed EF 20-25% with normal RV.  Discharge weight 147 pounds.  He also had NSVT while in house and will be continued on Lifevest placed in [place].    ECHO  04/2012 EF 20-25% 07/17/12 EF 30-35%  Labs 06/29/12 Potassium 5.4 Creatinine 1.05 07/17/12 Dig Level 0.5 Potassium 4.0 Creatinine 0.95 Pro BNP 244  Follow up:  Last visit increased losartan to 50 mg daily tolerated well. Complaints of SOB with exertion and walking up stairs. Denies orthopnea, CP or edema. Weight at home 173-175lbs. Taking medications as prescribed. Goes to see PCP on 8/25. No longer on antibiotics. Not doing any drugs or alcohol any more, however still smoking 2-3 cigarettes a day. Going to therapy and group session 1-3 times a week.  ROS: All systems negative except as listed in HPI, PMH and Problem List.  Past Medical History  Diagnosis Date  . Hypertension   . Hyperlipidemia   . CHF (congestive heart failure)   . C. difficile diarrhea     Current Outpatient Prescriptions  Medication Sig Dispense Refill  . atorvastatin (LIPITOR) 20 MG tablet Take 1 tablet (20 mg total) by mouth daily.  30 tablet  3  . carvedilol (COREG) 6.25 MG tablet Take 1.5 tablets (9.375 mg total) by mouth 2 (two) times daily with a meal.  30 tablet  0  . digoxin  (LANOXIN) 0.25 MG tablet Take 1 tablet (0.25 mg total) by mouth daily.  30 tablet  3  . furosemide (LASIX) 40 MG tablet Take 1 tablet (40 mg total) by mouth daily.  30 tablet  0  . losartan (COZAAR) 50 MG tablet Take 1 tablet (50 mg total) by mouth daily.  30 tablet  3  . metroNIDAZOLE (FLAGYL) 500 MG tablet Take 1.5 tablets (750 mg total) by mouth 3 (three) times daily.  42 tablet  0  . spironolactone (ALDACTONE) 25 MG tablet Take 1 tablet (25 mg total) by mouth daily.  30 tablet  3   No current facility-administered medications for this encounter.     PHYSICAL EXAM: Filed Vitals:   08/10/12 1010  BP: 126/84  Pulse: 56  Weight: 172 lb (78.019 kg)  SpO2: 100%  Last weight: 171  General:  Well appearing. No resp difficulty HEENT: normal Neck: supple. JVP flat. Carotids 2+ bilaterally; no bruits. No lymphadenopathy or thryomegaly appreciated. Cor: PMI normal. Regular rate & rhythm. No rubs, or murmurs. No gallop Life vest in place Lungs: clear Abdomen: soft, nontender, mild distention. No hepatosplenomegaly. No bruits or masses. Good bowel sounds. Extremities: no cyanosis, clubbing, rash,  edema Neuro: alert & orientedx3, cranial nerves grossly intact. Moves all 4 extremities w/o difficulty. Affect pleasant.   ASSESSMENT & PLAN:  1) Chronic systolic HF, NICM d/t ETOH, EF 30-35%.    - NYHA III. Continues with SOB and orthopnea. Volume status stable, continue lasix 40 mg daily along with Sprio 25 mg. - Will increase carvedilol to 9.375/12.5 mg daily - EF remains less than 35% will refer to EP for consideration of ICD. Discussed with patient the risks and benefits. Patient will continue to wear LifeVest. - Will get BMET and BNP today with Guide-It  2) Polysubstance abuse - remains drug and ETOH free and attending AA meetings and group sessions.  Follow up in 2  weeks.   Ulla Potash B NP-C 10:19 AM

## 2012-08-12 ENCOUNTER — Encounter: Payer: Self-pay | Admitting: Internal Medicine

## 2012-08-21 ENCOUNTER — Ambulatory Visit (HOSPITAL_COMMUNITY): Payer: 59 | Attending: Internal Medicine

## 2012-08-21 ENCOUNTER — Encounter (HOSPITAL_COMMUNITY): Payer: Self-pay | Admitting: *Deleted

## 2012-08-22 ENCOUNTER — Ambulatory Visit (HOSPITAL_COMMUNITY)
Admission: RE | Admit: 2012-08-22 | Discharge: 2012-08-22 | Disposition: A | Discharge status: Home / Self Care | Payer: Medicaid Other | Source: Ambulatory Visit | Attending: Internal Medicine | Admitting: Internal Medicine

## 2012-08-22 ENCOUNTER — Encounter: Payer: Self-pay | Admitting: Internal Medicine

## 2012-08-22 VITALS — BP 118/62 | HR 75 | Resp 20

## 2012-08-22 DIAGNOSIS — I5021 Acute systolic (congestive) heart failure: Secondary | ICD-10-CM

## 2012-08-22 DIAGNOSIS — I5022 Chronic systolic (congestive) heart failure: Secondary | ICD-10-CM

## 2012-08-22 MED ORDER — CARVEDILOL 6.25 MG PO TABS: 6.2500 mg | ORAL_TABLET | Freq: Two times a day (BID) | ORAL | Status: DC

## 2012-08-22 MED ORDER — LOSARTAN POTASSIUM 50 MG PO TABS: 50.0000 mg | ORAL_TABLET | Freq: Every day | ORAL | Status: DC

## 2012-08-22 NOTE — Progress Notes (Signed)
Pt walked in today and stated he missed appt yesterday because he was in the hospital at [facility], they did send a fax of a TTE which showed EF 50-55%, his d/c summary with medications show they stopped his carvedilol and losartan, reviewed all info with Dr Gala Romney, he would like pt to restart Carvedilol 6.25 mg bid and losartan 50 mg daily, continue the spiro 25 mg daily and atorvastatin 20 mg daily and stop digoxin, lasix and lifevest as EF has improved.  Called [employer] with order to d/c lifevest, pt is aware they will contact him about returning equipment, he verbalizes understanding of medications, new prescription sent to outpatient pharmacy and follow up appointment scheduled for 09/05/12.

## 2012-09-05 ENCOUNTER — Ambulatory Visit (HOSPITAL_COMMUNITY)
Admission: RE | Admit: 2012-09-05 | Discharge: 2012-09-05 | Disposition: A | Discharge status: Home / Self Care | Payer: Medicaid Other | Source: Ambulatory Visit | Attending: Internal Medicine | Admitting: Internal Medicine

## 2012-09-05 ENCOUNTER — Encounter (HOSPITAL_COMMUNITY): Payer: Self-pay

## 2012-09-05 VITALS — BP 124/90 | HR 64 | Ht 72.0 in | Wt 173.8 lb

## 2012-09-05 DIAGNOSIS — F1910 Other psychoactive substance abuse, uncomplicated: Secondary | ICD-10-CM

## 2012-09-05 DIAGNOSIS — F17200 Nicotine dependence, unspecified, uncomplicated: Secondary | ICD-10-CM

## 2012-09-05 DIAGNOSIS — A0472 Enterocolitis due to Clostridium difficile, not specified as recurrent: Secondary | ICD-10-CM

## 2012-09-05 DIAGNOSIS — I5022 Chronic systolic (congestive) heart failure: Secondary | ICD-10-CM

## 2012-09-05 DIAGNOSIS — E785 Hyperlipidemia, unspecified: Secondary | ICD-10-CM | POA: Insufficient documentation

## 2012-09-05 DIAGNOSIS — A0471 Enterocolitis due to Clostridium difficile, recurrent: Secondary | ICD-10-CM

## 2012-09-05 DIAGNOSIS — I10 Essential (primary) hypertension: Secondary | ICD-10-CM | POA: Insufficient documentation

## 2012-09-05 NOTE — Progress Notes (Signed)
Patient ID: Charles Cervantes, male   DOB: 1960/11/20, 52 y.o.   MRN: [id number]   Weight Range  171-173  Baseline proBNP   2656 on 05/01/12  Guide IT Study  HPI: Charles Cervantes is a 52 yo AAM with recent diagnosis of systolic heart failure secondary to NICM, EF 20-25%. As well as polysubstance abuse (cocaine, tobacco and alcohol). Treated for C-Diff in July 2014. (He is not on an Ace-I due to cough)  New diagnosis of systolic HF in [place] in 04/2012.  He was discharged with Rx for IV lasix, lisinopril and spiro.  He was unable to afford IV lasix and therefore had no fluid pill for ~1 week.  He has moved from [facility] and admitted to [facility] on 05/01/12 with progressive dyspnea and orthopnea.  Echo on admit showed EF 20-25% with normal RV.  Discharge weight 147 pounds.  He also had NSVT while in house and will be continued on Lifevest placed in [place].    ECHO  04/2012 EF 20-25% 07/17/12 EF 30-35% 08/2012 EF 50-55% at [facility]  Labs 06/29/12 Potassium 5.4 Creatinine 1.05 07/17/12 Dig Level 0.5 Potassium 4.0 Creatinine 0.95 Pro BNP 244  Discharged from [facility] in August 2014 with EF recovery noted 50-55%. On 08/22/12 Dr Gala Romney stopped digoxin and restarted carvedilol 6.25 mg twice a day, losartan 50 mg daily, spironolactone 25 mg  daily, and lasix 40 mg daily. Lifevest was discontinued.   He returns for follow up . Denies SOB/PND/Orthopnea. Taking all medications. Weight at home 170-173 pounds. Able to walk 1 mile once a week. He continue to attend AA meetings, group therapy, and one on one therapy. Medicaid finally approved. Smokes 8 cigarettes per day. No alcohol or drugs.    ROS: All systems negative except as listed in HPI, PMH and Problem List.  Past Medical History  Diagnosis Date  . Hypertension   . Hyperlipidemia   . CHF (congestive heart failure)   . C. difficile diarrhea     Current Outpatient Prescriptions  Medication Sig Dispense Refill  . atorvastatin (LIPITOR) 20 MG tablet Take 1 tablet (20 mg  total) by mouth daily.  30 tablet  3  . carvedilol (COREG) 6.25 MG tablet Take 1 tablet (6.25 mg total) by mouth 2 (two) times daily.  180 tablet  3  . losartan (COZAAR) 50 MG tablet Take 1 tablet (50 mg total) by mouth daily.  90 tablet  3  . spironolactone (ALDACTONE) 25 MG tablet Take 1 tablet (25 mg total) by mouth daily.  30 tablet  3   No current facility-administered medications for this encounter.     PHYSICAL EXAM: Filed Vitals:   09/05/12 1041  BP: 124/90  Pulse: 64  Height: 6' (1.829 m)  Weight: 173 lb 12.8 oz (78.835 kg)  SpO2: 100%  Last weight: 171>173  General:  Well appearing. No resp difficulty HEENT: normal Neck: supple. JVP flat. Carotids 2+ bilaterally; no bruits. No lymphadenopathy or thryomegaly appreciated. Cor: PMI normal. Regular rate & rhythm. No rubs, or murmurs. No gallop Life vest in place Lungs: clear Abdomen: soft, nontender, mild distention. No hepatosplenomegaly. No bruits or masses. Good bowel sounds. Extremities: no cyanosis, clubbing, rash,  edema Neuro: alert & orientedx3, cranial nerves grossly intact. Moves all 4 extremities w/o difficulty. Affect pleasant.   ASSESSMENT & PLAN:  1) Chronic systolic HF, NICM d/t ETOH, 07/17/12 EF 30-35%> EF 50-55% 08/2012 at [facility] EF recovery noted. Dig stopped last month as well as life vest.    -  Volume status stable. Continue Sprio 25 mg daily. - Continue carvedilol 6.25. Mg twice a day -Continue losartan 50 mg dialy - Will get BMET and BNP today with Guide-It  Reinforced daily weights and medication compliance.   2) Polysubstance abuse - remains drug and ETOH free and attending AA meetings and group sessions.  3) Current smoker Smokes 8 cigarettes per day. Declines smoking cessation  4) C diff Follow up at [facility] tomorrow   Follow up in 2 monhts  CLEGG,AMY NP-C 10:47 AM

## 2012-09-05 NOTE — Patient Instructions (Addendum)
Follow up   Do the following things EVERYDAY: 1) Weigh yourself in the morning before breakfast. Write it down and keep it in a log. 2) Take your medicines as prescribed 3) Eat low salt foods-Limit salt (sodium) to 2000 mg per day.  4) Stay as active as you can everyday 5) Limit all fluids for the day to less than 2 liters

## 2012-09-19 ENCOUNTER — Institutional Professional Consult (permissible substitution): Payer: 59 | Admitting: Internal Medicine

## 2012-10-01 ENCOUNTER — Ambulatory Visit (HOSPITAL_COMMUNITY)
Admission: RE | Admit: 2012-10-01 | Discharge: 2012-10-01 | Disposition: A | Discharge status: Home / Self Care | Payer: Medicaid Other | Source: Ambulatory Visit | Attending: Internal Medicine | Admitting: Internal Medicine

## 2012-10-01 ENCOUNTER — Other Ambulatory Visit (HOSPITAL_COMMUNITY): Payer: Self-pay | Admitting: Internal Medicine

## 2012-10-01 DIAGNOSIS — M25512 Pain in left shoulder: Secondary | ICD-10-CM

## 2012-10-01 DIAGNOSIS — M25519 Pain in unspecified shoulder: Secondary | ICD-10-CM | POA: Insufficient documentation

## 2012-10-05 ENCOUNTER — Encounter: Payer: Self-pay | Admitting: Internal Medicine

## 2012-11-01 ENCOUNTER — Encounter (HOSPITAL_COMMUNITY): Payer: Self-pay

## 2012-11-01 ENCOUNTER — Ambulatory Visit (HOSPITAL_COMMUNITY)
Admission: RE | Admit: 2012-11-01 | Discharge: 2012-11-01 | Disposition: A | Discharge status: Home / Self Care | Payer: Medicaid Other | Source: Ambulatory Visit | Attending: Internal Medicine | Admitting: Internal Medicine

## 2012-11-01 VITALS — BP 132/100 | HR 70 | Wt 185.8 lb

## 2012-11-01 DIAGNOSIS — F1010 Alcohol abuse, uncomplicated: Secondary | ICD-10-CM

## 2012-11-01 DIAGNOSIS — I5022 Chronic systolic (congestive) heart failure: Secondary | ICD-10-CM

## 2012-11-01 DIAGNOSIS — F17200 Nicotine dependence, unspecified, uncomplicated: Secondary | ICD-10-CM

## 2012-11-01 MED ORDER — LOSARTAN POTASSIUM 50 MG PO TABS: 50.0000 mg | ORAL_TABLET | Freq: Every day | ORAL | Status: DC

## 2012-11-01 MED ORDER — FUROSEMIDE 40 MG PO TABS: 40.0000 mg | ORAL_TABLET | ORAL | Status: DC | PRN

## 2012-11-01 NOTE — Progress Notes (Signed)
Patient ID: Charles Cervantes, male   DOB: [birth year], 52 y.o.   MRN: [id number]  Weight Range  171-173  Baseline proBNP   2656 on 05/01/12  Guide IT Study  HPI: Charles Cervantes is a 52 yo AAM with recent diagnosis of systolic heart failure secondary to NICM, EF 20-25%. As well as polysubstance abuse (cocaine, tobacco and alcohol). Treated for C-Diff in July 2014. (He is not on an Ace-I due to cough)  New diagnosis of systolic HF in [place] in 04/2012.  He was discharged with Rx for IV lasix, lisinopril and spiro.  He was unable to afford IV lasix and therefore had no fluid pill for ~1 week.  He has moved from [facility] and admitted to [facility] on 05/01/12 with progressive dyspnea and orthopnea.  Echo on admit showed EF 20-25% with normal RV.  Discharge weight 147 pounds.  He also had NSVT while in house and will be continued on Lifevest placed in [place].    ECHO  04/2012 EF 20-25% 07/17/12 EF 30-35% 08/2012 EF 50-55% at [facility]  Labs 06/29/12 Potassium 5.4 Creatinine 1.05 07/17/12 Dig Level 0.5 Potassium 4.0 Creatinine 0.95 Pro BNP 244  Discharged from [facility] in August 2014 with EF recovery noted 50-55%. On 08/22/12 Dr Gala Romney stopped digoxin and restarted carvedilol 6.25 mg twice a day, losartan 50 mg daily, spironolactone 25 mg  daily, and lasix 40 mg daily. Lifevest was discontinued.   He returns for an acute follow up due to dyspnea.  Report mild dyspnea with exertion. Denies PND/Orthopnea.He ran out of losartan 2 weeks ago. Weight at home 185  pounds.  He continues to attend AA meetings, group therapy, and one on one therapy. Smokes 8 cigarettes per day. No alcohol or drugs.    ROS: All systems negative except as listed in HPI, PMH and Problem List.  Past Medical History  Diagnosis Date  . Hypertension   . Hyperlipidemia   . CHF (congestive heart failure)   . C. difficile diarrhea     Current Outpatient Prescriptions  Medication Sig Dispense Refill  . atorvastatin (LIPITOR) 20 MG tablet Take 1 tablet (20  mg total) by mouth daily.  30 tablet  3  . carvedilol (COREG) 6.25 MG tablet Take 9.375 mg by mouth 2 (two) times daily.      Marland Kitchen losartan (COZAAR) 50 MG tablet Take 1 tablet (50 mg total) by mouth daily.  90 tablet  3  . omeprazole (PRILOSEC) 40 MG capsule Take 40 mg by mouth 2 (two) times daily.      Marland Kitchen spironolactone (ALDACTONE) 25 MG tablet Take 1 tablet (25 mg total) by mouth daily.  30 tablet  3   No current facility-administered medications for this encounter.     PHYSICAL EXAM: Filed Vitals:   11/01/12 0944  BP: 132/100  Pulse: 70  Weight: 185 lb 12.8 oz (84.278 kg)  SpO2: 100%  Last weight: 51>173> 185   General:  Well appearing. No resp difficulty HEENT: normal Neck: supple. JVP 5-6t. Carotids 2+ bilaterally; no bruits. No lymphadenopathy or thryomegaly appreciated. Cor: PMI normal. Regular rate & rhythm. No rubs, or murmurs. No gallop Life vest in place Lungs: clear Abdomen: soft, nontender, mild distention. No hepatosplenomegaly. No bruits or masses. Good bowel sounds. Extremities: no cyanosis, clubbing, rash,  edema Neuro: alert & orientedx3, cranial nerves grossly intact. Moves all 4 extremities w/o difficulty. Affect pleasant.   ASSESSMENT & PLAN:  1) Chronic systolic HF, NICM d/t ETOH, 07/17/12 EF 30-35%> EF 50-55%  08/2012 at [facility]. EF recovery noted. Dig stopped last month as well as life vest.    - Volume status mildly elevated. Instructed to 40 mg twice a day for the next 2 days and as needed for increased weight gain.. Continue Sprio 25 mg daily. - Continue carvedilol 6.25. Mg twice a day -Resume  losartan 50 mg daily - Dr Gala Romney performed bedside ECHO.  -Will get BMET and BNP today with [employer]-It  Reinforced daily weights and medication compliance.   2) Polysubstance abuse - remains drug and ETOH free and attending AA meetings and group sessions.  3) Current smoker Smokes 8 cigarettes per day. Declines smoking cessation  Follow up in 2  monhts  CLEGG,AMY NP-C 10:15 AM   Patient seen and examined with [name], NP. We discussed all aspects of the encounter. I agree with the assessment and plan as stated above. Charles Cervantes returns as a walk in c/o worsening dyspnea and weight gain. His weight is up about 12 pounds but I do not see much volume on exam. He says he feels just like when his HF was bad. I performed bedside echo personally and EF probably in the 35-40% range. We will increase his lasix for 2 days to see if this will help. Will check formal echo. Long talk about need to take medications as prescribed and comply with dietary restrictions. Also reinforced need for abstinence from ETOH and tobacco. Will see back in a few weeks.   Time spent 45 minutes with over 3/4 of that time spent discussing above issues and doing bedside echo personally.   Doyne Micke,MD 12:21 AM

## 2012-11-01 NOTE — Patient Instructions (Addendum)
   Follow up 2 weeks  Take lasix 40 mg twice a day for 2 days   Do the following things EVERYDAY: 1) Weigh yourself in the morning before breakfast. Write it down and keep it in a log. 2) Take your medicines as prescribed 3) Eat low salt foods-Limit salt (sodium) to 2000 mg per day.  4) Stay as active as you can everyday 5) Limit all fluids for the day to less than 2 liters

## 2012-11-05 ENCOUNTER — Encounter (HOSPITAL_COMMUNITY): Payer: Medicaid Other

## 2012-11-16 ENCOUNTER — Encounter (HOSPITAL_COMMUNITY): Payer: Self-pay

## 2012-11-16 ENCOUNTER — Ambulatory Visit (HOSPITAL_COMMUNITY)
Admission: RE | Admit: 2012-11-16 | Discharge: 2012-11-16 | Disposition: A | Discharge status: Home / Self Care | Payer: Medicaid Other | Source: Ambulatory Visit | Attending: Internal Medicine | Admitting: Internal Medicine

## 2012-11-16 ENCOUNTER — Encounter (HOSPITAL_COMMUNITY): Payer: Self-pay | Admitting: *Deleted

## 2012-11-16 ENCOUNTER — Telehealth (HOSPITAL_COMMUNITY): Payer: Self-pay | Admitting: Cardiology

## 2012-11-16 VITALS — BP 146/100 | HR 62 | Wt 187.0 lb

## 2012-11-16 DIAGNOSIS — I5022 Chronic systolic (congestive) heart failure: Secondary | ICD-10-CM

## 2012-11-16 DIAGNOSIS — F17200 Nicotine dependence, unspecified, uncomplicated: Secondary | ICD-10-CM

## 2012-11-16 DIAGNOSIS — R079 Chest pain, unspecified: Secondary | ICD-10-CM

## 2012-11-16 LAB — CBC
HCT: 37.6 % — ABNORMAL LOW (ref 39.0–52.0)
MCH: 30 pg (ref 26.0–34.0)
MCHC: 35.1 g/dL (ref 30.0–36.0)
MCV: 85.5 fL (ref 78.0–100.0)
Platelets: 207 10*3/uL (ref 150–400)
RDW: 13.2 % (ref 11.5–15.5)
WBC: 4.2 10*3/uL (ref 4.0–10.5)

## 2012-11-16 MED ORDER — FUROSEMIDE 40 MG PO TABS: 40.0000 mg | ORAL_TABLET | Freq: Every day | ORAL | Status: DC

## 2012-11-16 MED ORDER — LOSARTAN POTASSIUM 100 MG PO TABS: 100.0000 mg | ORAL_TABLET | Freq: Every day | ORAL | Status: DC

## 2012-11-16 NOTE — Addendum Note (Signed)
Encounter addended by: Noralee Space, RN on: 11/16/2012 12:04 PM<BR>     Documentation filed: Orders

## 2012-11-16 NOTE — Patient Instructions (Signed)
Start taking Furosemide (Lasix) every day  Increase Losartan to 100 mg daily (2 tabs)  Your physician recommends that you schedule a follow-up appointment in: 2 weeks

## 2012-11-16 NOTE — Progress Notes (Signed)
Patient ID: Charles Cervantes, male   DOB: 07/07/1960, 52 y.o.   MRN: [id number]  Weight Range  171-173  Baseline proBNP   2656 on 05/01/12  Guide IT Study  HPI: Charles Cervantes is a 52 yo AAM diagnosed with systolic heart failure in 4/14 in [place] secondary to NICM (normal cors on cath), EF 20-25%. As well as polysubstance abuse (cocaine, tobacco and alcohol). Treated for C-Diff in July 2014. (He is not on an Ace-I due to cough)  New diagnosis of systolic HF in [place] in 04/2012.  He was discharged with Rx for IV lasix, lisinopril and spiro.  He was unable to afford IV lasix and therefore had no fluid pill for ~1 week.  He has moved from [place] and admitted to [facility] on 05/01/12 with progressive dyspnea and orthopnea.  Echo on admit showed EF 20-25% with normal RV.  Discharge weight 147 pounds.  He also had NSVT while in house and will be continued on Lifevest placed in [place].    ECHO  04/2012 EF 20-25% 07/17/12 EF 30-35% 08/2012 EF 50-55% at [facility] 10/14 bedside echo ~35-40%  Labs 06/29/12 Potassium 5.4 Creatinine 1.05 07/17/12 Dig Level 0.5 Potassium 4.0 Creatinine 0.95 Pro BNP 244  Discharged from [facility] in August 2014 with EF recovery noted 50-55%  We saw him about 2 weeks ago for acute walk in visit for dyspnea and weight gain. However volume status looked ok. i did bedside echo and EF 35-40%. BNP 177 We gave him extra lasix for 2 days without much change. Has gained another pound. (15 pounds total over last few months).  Feels dyspneic and chest tightness with just a little walking. Mild orthopnea.  Smokes 8 cigarettes per day. No alcohol or drugs. Taking all meds  Takes lasix only as needed.  (taking 3x per week)    ROS: All systems negative except as listed in HPI, PMH and Problem List.  Past Medical History  Diagnosis Date  . Hypertension   . Hyperlipidemia   . CHF (congestive heart failure)   . C. difficile diarrhea     Current Outpatient Prescriptions  Medication Sig Dispense Refill  .  atorvastatin (LIPITOR) 20 MG tablet Take 1 tablet (20 mg total) by mouth daily.  30 tablet  3  . carvedilol (COREG) 6.25 MG tablet Take 9.375 mg by mouth 2 (two) times daily.      . furosemide (LASIX) 40 MG tablet Take 1 tablet (40 mg total) by mouth as needed.  30 tablet  6  . hydrOXYzine (VISTARIL) 50 MG capsule Take 50 mg by mouth at bedtime.      . losartan (COZAAR) 50 MG tablet Take 1 tablet (50 mg total) by mouth daily.  30 tablet  9  . omeprazole (PRILOSEC) 40 MG capsule Take 40 mg by mouth 2 (two) times daily.      . sertraline (ZOLOFT) 50 MG tablet Take 50 mg by mouth daily.      . spironolactone (ALDACTONE) 25 MG tablet Take 1 tablet (25 mg total) by mouth daily.  30 tablet  3   No current facility-administered medications for this encounter.     PHYSICAL EXAM: Filed Vitals:   11/16/12 1121  BP: 146/100  Pulse: 62  Weight: 187 lb (84.823 kg)  SpO2: 100%  Last weight: 171>173> 185 >187  General:  Well appearing. No resp difficulty HEENT: normal Neck: supple. JVP 6-7. Carotids 2+ bilaterally; no bruits. No lymphadenopathy or thryomegaly appreciated. Cor: PMI normal. Regular rate &   rhythm. No rubs, or murmurs. +s4 Lungs: clear Abdomen: soft, nontender, ?mild distention. No hepatosplenomegaly. No bruits or masses. Good bowel sounds. Extremities: no cyanosis, clubbing, rash,  Tr edema on R Neuro: alert & orientedx3, cranial nerves grossly intact. Moves all 4 extremities w/o difficulty. Affect pleasant.   ASSESSMENT & PLAN:  1) Chronic systolic HF, NICM    --C/o worsening symptoms. Now NYHA III-IIIB. However I still do not see too much volume overload and BNP low. . Will make lasix 40 daily and increase losartan to 100 daily. Plan Left and RHC next week to further evaluate.  -Repeat echo   2) Chest pain  -unclear if this is ischemic, due to HF or noncardiac. It has been progressive. Given CRFs will plan LHC at time of RHC.  3) Polysubstance abuse - remains drug and ETOH  free and attending AA meetings and group sessions.  4) Current smoker Smokes 8 cigarettes per day. Declines smoking cessation  5) HTN -BP up. Increase losartan 100 Follow up in 2 monhts   Gunnar Hereford,MD 11:27 AM    

## 2012-11-16 NOTE — Telephone Encounter (Signed)
Pt is scheduled for RHC 11/20/12 No pre cert req'd for procedure with pts current insurance- MEDICAID

## 2012-11-16 NOTE — Addendum Note (Signed)
Encounter addended by: Noralee Space, RN on: 11/16/2012 11:53 AM<BR>     Documentation filed: Patient Instructions Section, Orders

## 2012-11-19 ENCOUNTER — Other Ambulatory Visit (HOSPITAL_COMMUNITY): Payer: Self-pay | Admitting: Anesthesiology

## 2012-11-19 ENCOUNTER — Other Ambulatory Visit (HOSPITAL_COMMUNITY): Payer: Self-pay | Admitting: Adult Health

## 2012-11-19 DIAGNOSIS — I5022 Chronic systolic (congestive) heart failure: Secondary | ICD-10-CM

## 2012-11-20 ENCOUNTER — Encounter (HOSPITAL_COMMUNITY): Payer: Self-pay | Admitting: *Deleted

## 2012-11-20 ENCOUNTER — Encounter (HOSPITAL_COMMUNITY): Payer: Self-pay | Admitting: Pharmacy Technician

## 2012-11-20 ENCOUNTER — Ambulatory Visit (HOSPITAL_COMMUNITY)
Admission: RE | Admit: 2012-11-20 | Discharge: 2012-11-20 | Disposition: A | Discharge status: Home / Self Care | Payer: Medicaid Other | Source: Ambulatory Visit | Attending: Internal Medicine | Admitting: Internal Medicine

## 2012-11-20 ENCOUNTER — Encounter (HOSPITAL_COMMUNITY)
Admission: RE | Disposition: A | Discharge status: Home / Self Care | Payer: Self-pay | Source: Ambulatory Visit | Attending: Internal Medicine

## 2012-11-20 DIAGNOSIS — I10 Essential (primary) hypertension: Secondary | ICD-10-CM | POA: Insufficient documentation

## 2012-11-20 DIAGNOSIS — I509 Heart failure, unspecified: Secondary | ICD-10-CM

## 2012-11-20 DIAGNOSIS — R079 Chest pain, unspecified: Secondary | ICD-10-CM | POA: Insufficient documentation

## 2012-11-20 DIAGNOSIS — I5022 Chronic systolic (congestive) heart failure: Secondary | ICD-10-CM

## 2012-11-20 DIAGNOSIS — F17200 Nicotine dependence, unspecified, uncomplicated: Secondary | ICD-10-CM | POA: Insufficient documentation

## 2012-11-20 DIAGNOSIS — E785 Hyperlipidemia, unspecified: Secondary | ICD-10-CM | POA: Insufficient documentation

## 2012-11-20 DIAGNOSIS — R635 Abnormal weight gain: Secondary | ICD-10-CM | POA: Insufficient documentation

## 2012-11-20 DIAGNOSIS — I2510 Atherosclerotic heart disease of native coronary artery without angina pectoris: Secondary | ICD-10-CM | POA: Insufficient documentation

## 2012-11-20 DIAGNOSIS — I428 Other cardiomyopathies: Secondary | ICD-10-CM | POA: Insufficient documentation

## 2012-11-20 HISTORY — PX: LEFT AND RIGHT HEART CATHETERIZATION WITH CORONARY ANGIOGRAM: SHX5449

## 2012-11-20 LAB — BASIC METABOLIC PANEL
BUN: 9 mg/dL (ref 6–23)
CO2: 27 mEq/L (ref 19–32)
Chloride: 103 mEq/L (ref 96–112)
Creatinine, Ser: 1.18 mg/dL (ref 0.50–1.35)
GFR calc Af Amer: 81 mL/min — ABNORMAL LOW (ref >90)
GFR calc non Af Amer: 70 mL/min — ABNORMAL LOW (ref >90)
Potassium: 4 mEq/L (ref 3.5–5.1)

## 2012-11-20 LAB — POCT I-STAT 3, VENOUS BLOOD GAS (G3P V)
Acid-base deficit: 1 mmol/L (ref 0.0–2.0)
Acid-base deficit: 1 mmol/L (ref 0.0–2.0)
Acid-base deficit: 1 mmol/L (ref 0.0–2.0)
Bicarbonate: 25.1 mEq/L — ABNORMAL HIGH (ref 20.0–24.0)
Bicarbonate: 25.4 mEq/L — ABNORMAL HIGH (ref 20.0–24.0)
O2 Saturation: 72 %
O2 Saturation: 73 %
TCO2: 27 mmol/L (ref 0–100)
pCO2, Ven: 47.6 mmHg (ref 45.0–50.0)
pH, Ven: 7.329 — ABNORMAL HIGH (ref 7.250–7.300)
pH, Ven: 7.333 — ABNORMAL HIGH (ref 7.250–7.300)
pH, Ven: 7.341 — ABNORMAL HIGH (ref 7.250–7.300)
pO2, Ven: 42 mmHg (ref 30.0–45.0)

## 2012-11-20 LAB — PROTIME-INR
INR: 0.99 (ref 0.00–1.49)
Prothrombin Time: 12.9 seconds (ref 11.6–15.2)

## 2012-11-20 LAB — POCT I-STAT 3, ART BLOOD GAS (G3+)
Acid-base deficit: 2 mmol/L (ref 0.0–2.0)
Bicarbonate: 23 mEq/L (ref 20.0–24.0)
O2 Saturation: 95 %
pH, Arterial: 7.353 (ref 7.350–7.450)
pO2, Arterial: 80 mmHg (ref 80.0–100.0)

## 2012-11-20 LAB — CBC
HCT: 39.1 % (ref 39.0–52.0)
MCHC: 34.3 g/dL (ref 30.0–36.0)
RBC: 4.53 MIL/uL (ref 4.22–5.81)
RDW: 13.6 % (ref 11.5–15.5)
WBC: 3.8 10*3/uL — ABNORMAL LOW (ref 4.0–10.5)

## 2012-11-20 SURGERY — LEFT AND RIGHT HEART CATHETERIZATION WITH CORONARY ANGIOGRAM
Anesthesia: LOCAL

## 2012-11-20 MED ORDER — MIDAZOLAM HCL 2 MG/2ML IJ SOLN
INTRAMUSCULAR | Status: AC
  Filled 2012-11-20: qty 2

## 2012-11-20 MED ORDER — ONDANSETRON HCL 4 MG/2ML IJ SOLN: 4.0000 mg | Freq: Four times a day (QID) | INTRAMUSCULAR | Status: DC | PRN

## 2012-11-20 MED ORDER — SODIUM CHLORIDE 0.9 % IV SOLN: INTRAVENOUS | Status: AC

## 2012-11-20 MED ORDER — SODIUM CHLORIDE 0.9 % IJ SOLN: 3.0000 mL | INTRAMUSCULAR | Status: DC | PRN

## 2012-11-20 MED ORDER — ASPIRIN 81 MG PO CHEW
81.0000 mg | CHEWABLE_TABLET | ORAL | Status: AC
  Administered 2012-11-20: 81 mg via ORAL

## 2012-11-20 MED ORDER — SODIUM CHLORIDE 0.9 % IV SOLN
INTRAVENOUS | Status: DC
  Administered 2012-11-20: 20 mL/h via INTRAVENOUS

## 2012-11-20 MED ORDER — LIDOCAINE HCL (PF) 1 % IJ SOLN
INTRAMUSCULAR | Status: AC
  Filled 2012-11-20: qty 30

## 2012-11-20 MED ORDER — HEPARIN SODIUM (PORCINE) 1000 UNIT/ML IJ SOLN
INTRAMUSCULAR | Status: AC
  Filled 2012-11-20: qty 1

## 2012-11-20 MED ORDER — DIAZEPAM 5 MG PO TABS: 5.0000 mg | ORAL_TABLET | Freq: Once | ORAL | Status: DC

## 2012-11-20 MED ORDER — SODIUM CHLORIDE 0.9 % IV SOLN: 250.0000 mL | INTRAVENOUS | Status: DC | PRN

## 2012-11-20 MED ORDER — DIAZEPAM 5 MG PO TABS
ORAL_TABLET | ORAL | Status: AC
  Administered 2012-11-20: 5 mg via OROMUCOSAL
  Filled 2012-11-20: qty 1

## 2012-11-20 MED ORDER — FENTANYL CITRATE 0.05 MG/ML IJ SOLN
INTRAMUSCULAR | Status: AC
  Filled 2012-11-20: qty 2

## 2012-11-20 MED ORDER — ASPIRIN 81 MG PO CHEW
CHEWABLE_TABLET | ORAL | Status: AC
  Administered 2012-11-20: 81 mg via ORAL
  Filled 2012-11-20: qty 1

## 2012-11-20 MED ORDER — SODIUM CHLORIDE 0.9 % IJ SOLN: 3.0000 mL | Freq: Two times a day (BID) | INTRAMUSCULAR | Status: DC

## 2012-11-20 MED ORDER — VERAPAMIL HCL 2.5 MG/ML IV SOLN
INTRAVENOUS | Status: AC
  Filled 2012-11-20: qty 2

## 2012-11-20 MED ORDER — ACETAMINOPHEN 325 MG PO TABS
650.0000 mg | ORAL_TABLET | ORAL | Status: DC | PRN
  Administered 2012-11-20: 650 mg via ORAL
  Filled 2012-11-20 (×2): qty 2

## 2012-11-20 MED ORDER — HEPARIN (PORCINE) IN NACL 2-0.9 UNIT/ML-% IJ SOLN
INTRAMUSCULAR | Status: AC
  Filled 2012-11-20: qty 1000

## 2012-11-20 NOTE — Progress Notes (Signed)
TR BAND REMOVAL  LOCATION:    right radial  DEFLATED PER PROTOCOL:    yes  TIME BAND OFF / DRESSING APPLIED:    1500   SITE UPON ARRIVAL:    Level 0  SITE AFTER BAND REMOVAL:    Level 0  CIRCULATION SENSATION AND MOVEMENT:    Within Normal Limits   yes

## 2012-11-20 NOTE — H&P (View-Only) (Signed)
Patient ID: ZAYAAN KOZAK, male   DOB: [birth year], 52 y.o.   MRN: [id number]  Weight Range  171-173  Baseline proBNP   2656 on 05/01/12  Guide IT Study  HPI: Mr Smolinsky is a 52 yo AAM diagnosed with systolic heart failure in 4/14 in [place] secondary to NICM (normal cors on cath), EF 20-25%. As well as polysubstance abuse (cocaine, tobacco and alcohol). Treated for C-Diff in July 2014. (He is not on an Ace-I due to cough)  New diagnosis of systolic HF in [place] in 04/2012.  He was discharged with Rx for IV lasix, lisinopril and spiro.  He was unable to afford IV lasix and therefore had no fluid pill for ~1 week.  He has moved from [facility] and admitted to [facility] on 05/01/12 with progressive dyspnea and orthopnea.  Echo on admit showed EF 20-25% with normal RV.  Discharge weight 147 pounds.  He also had NSVT while in house and will be continued on Lifevest placed in [place].    ECHO  04/2012 EF 20-25% 07/17/12 EF 30-35% 08/2012 EF 50-55% at [facility] 10/14 bedside echo ~35-40%  Labs 06/29/12 Potassium 5.4 Creatinine 1.05 07/17/12 Dig Level 0.5 Potassium 4.0 Creatinine 0.95 Pro BNP 244  Discharged from [facility] in August 2014 with EF recovery noted 50-55%  We saw him about 2 weeks ago for acute walk in visit for dyspnea and weight gain. However volume status looked ok. i did bedside echo and EF 35-40%. BNP 177 We gave him extra lasix for 2 days without much change. Has gained another pound. (15 pounds total over last few months).  Feels dyspneic and chest tightness with just a little walking. Mild orthopnea.  Smokes 8 cigarettes per day. No alcohol or drugs. Taking all meds  Takes lasix only as needed.  (taking 3x per week)    ROS: All systems negative except as listed in HPI, PMH and Problem List.  Past Medical History  Diagnosis Date  . Hypertension   . Hyperlipidemia   . CHF (congestive heart failure)   . C. difficile diarrhea     Current Outpatient Prescriptions  Medication Sig Dispense Refill  .  atorvastatin (LIPITOR) 20 MG tablet Take 1 tablet (20 mg total) by mouth daily.  30 tablet  3  . carvedilol (COREG) 6.25 MG tablet Take 9.375 mg by mouth 2 (two) times daily.      . furosemide (LASIX) 40 MG tablet Take 1 tablet (40 mg total) by mouth as needed.  30 tablet  6  . hydrOXYzine (VISTARIL) 50 MG capsule Take 50 mg by mouth at bedtime.      Marland Kitchen losartan (COZAAR) 50 MG tablet Take 1 tablet (50 mg total) by mouth daily.  30 tablet  9  . omeprazole (PRILOSEC) 40 MG capsule Take 40 mg by mouth 2 (two) times daily.      . sertraline (ZOLOFT) 50 MG tablet Take 50 mg by mouth daily.      Marland Kitchen spironolactone (ALDACTONE) 25 MG tablet Take 1 tablet (25 mg total) by mouth daily.  30 tablet  3   No current facility-administered medications for this encounter.     PHYSICAL EXAM: Filed Vitals:   11/16/12 1121  BP: 146/100  Pulse: 62  Weight: 187 lb (84.823 kg)  SpO2: 100%  Last weight: 171>173> 185 >187  General:  Well appearing. No resp difficulty HEENT: normal Neck: supple. JVP 6-7. Carotids 2+ bilaterally; no bruits. No lymphadenopathy or thryomegaly appreciated. Cor: PMI normal. Regular rate &  rhythm. No rubs, or murmurs. +s4 Lungs: clear Abdomen: soft, nontender, ?mild distention. No hepatosplenomegaly. No bruits or masses. Good bowel sounds. Extremities: no cyanosis, clubbing, rash,  Tr edema on R Neuro: alert & orientedx3, cranial nerves grossly intact. Moves all 4 extremities w/o difficulty. Affect pleasant.   ASSESSMENT & PLAN:  1) Chronic systolic HF, NICM    --C/o worsening symptoms. Now NYHA III-IIIB. However I still do not see too much volume overload and BNP low. . Will make lasix 40 daily and increase losartan to 100 daily. Plan Left and RHC next week to further evaluate.  -Repeat echo   2) Chest pain  -unclear if this is ischemic, due to HF or noncardiac. It has been progressive. Given CRFs will plan LHC at time of RHC.  3) Polysubstance abuse - remains drug and ETOH  free and attending AA meetings and group sessions.  4) Current smoker Smokes 8 cigarettes per day. Declines smoking cessation  5) HTN -BP up. Increase losartan 100 Follow up in 2 monhts   Panagiota Perfetti,MD 11:27 AM

## 2012-11-20 NOTE — CV Procedure (Signed)
Cardiac Cath Procedure Note:  Indication: CP/CHF  Procedures performed:  1) Selective coronary angiography 2) Left heart catheterization 3) Left ventriculogram 4) Right hear catheterization  Description of procedure:   The risks and indication of the procedure were explained. Consent was signed and placed on the chart. An appropriate timeout was taken prior to the procedure. After a normal Allen's test was confirmed, the right wrist was prepped and draped in the routine sterile fashion and anesthetized with 1% local lidocaine.   A 5 FR arterial sheath was then placed in the right radial artery using a modified Seldinger technique. Systemic heparin was administered. 3mg  IV verapamil was given through the sheath. Standard catheters including a JL 3.5, JR4 and straight pigtail were used. All catheter exchanges were made over a wire.  The existing peripheral IV in the right AC fossa was exchanged for a 5 FR venous sheath. A 5FR Swan-Ganz catheter was used and a RHC was performed from the R brachial vein.   Complications: None apparent.  Contrast: 60 cc  Findings:  RA = 6 RV = 33/3/8 PA = 29/9 (18) PCW = 8 Fick cardiac output/index = 6.4/3.2 PVR = 1.5 WU FA sat = 95% PA sat = 72%, 73% SVC sat 72%  Ao Pressure: 133/80 (102) LV Pressure: 153/5/12 No AS   Left main:  Calcified. No significant stenosis.   LAD: Long vessel wrapping the apex. Gives off large D1 and small D2. Extensive calcification in proximal and mid LAD with only mild intraluminal stenosis with 20-30% stenosis.  LCX: Gives off very  branching ramus which is normal. AV groove LCX is small and normal.  RCA: Large dominant vessel. Normal.   LV-gram done in the RAO projection: Ejection fraction = ~35-40% with global HK worse at apex  Assessment: 1. Mild non-obstructive CAD 2. Normal right heart cath 3. Mild to moderate LV dysfunction 4. No evidence intracardiac shunt  Plan/Discussion:  Minimal CAD. His  hemodynamics are essentially normal and don't correlate well with his symptoms. Weight gain does not seem related to HF. Continue medical therapy.   Charles Cervantes 1:13 PM

## 2012-11-20 NOTE — Interval H&P Note (Signed)
History and Physical Interval Note:  11/20/2012 12:29 PM  Charles Cervantes  has presented today for surgery, with the diagnosis of CP and CHF  The various methods of treatment have been discussed with the patient and family. After consideration of risks, benefits and other options for treatment, the patient has consented to  Procedure(s): LEFT AND RIGHT HEART CATHETERIZATION WITH CORONARY ANGIOGRAM (N/A) as a surgical intervention .  The patient's history has been reviewed, patient examined, no change in status, stable for surgery.  I have reviewed the patient's chart and labs.  Questions were answered to the patient's satisfaction.     Daniel Bensimhon

## 2012-11-20 NOTE — Interval H&P Note (Signed)
He is he Cath Lab Visit (complete for each Cath Lab visit)  Clinical Evaluation Leading to the Procedure:   ACS: no  Non-ACS:    Anginal Classification: CCS III  Anti-ischemic medical therapy: Minimal Therapy (1 class of medications)  Non-Invasive Test Results: No non-invasive testing performed  Prior CABG: No previous CABG      History and Physical Interval Note:  11/20/2012 12:29 PM  Charles Cervantes  has presented today for surgery, with the diagnosis of CHF  The various methods of treatment have been discussed with the patient and family. After consideration of risks, benefits and other options for treatment, the patient has consented to  Procedure(s): LEFT AND RIGHT HEART CATHETERIZATION WITH CORONARY ANGIOGRAM (N/A) as a surgical intervention .  The patient's history has been reviewed, patient examined, no change in status, stable for surgery.  I have reviewed the patient's chart and labs.  Questions were answered to the patient's satisfaction.     Sonu Kruckenberg

## 2012-11-28 ENCOUNTER — Encounter (HOSPITAL_COMMUNITY): Payer: Medicaid Other

## 2012-12-10 ENCOUNTER — Encounter: Payer: Self-pay | Admitting: Internal Medicine

## 2012-12-11 ENCOUNTER — Ambulatory Visit (HOSPITAL_COMMUNITY)
Admission: RE | Admit: 2012-12-11 | Discharge: 2012-12-11 | Disposition: A | Discharge status: Home / Self Care | Payer: Medicaid Other | Source: Ambulatory Visit | Attending: Internal Medicine | Admitting: Internal Medicine

## 2012-12-11 VITALS — BP 140/88 | HR 66 | Wt 192.0 lb

## 2012-12-11 DIAGNOSIS — R079 Chest pain, unspecified: Secondary | ICD-10-CM

## 2012-12-11 DIAGNOSIS — I5022 Chronic systolic (congestive) heart failure: Secondary | ICD-10-CM

## 2012-12-11 DIAGNOSIS — F17200 Nicotine dependence, unspecified, uncomplicated: Secondary | ICD-10-CM

## 2012-12-11 MED ORDER — LISINOPRIL 10 MG PO TABS: 10.0000 mg | ORAL_TABLET | Freq: Two times a day (BID) | ORAL | Status: DC

## 2012-12-11 MED ORDER — ATORVASTATIN CALCIUM 20 MG PO TABS: 20.0000 mg | ORAL_TABLET | Freq: Every day | ORAL | Status: DC

## 2012-12-11 NOTE — Progress Notes (Signed)
Patient ID: Charles Cervantes, male   DOB: [birth year], 52 y.o.   MRN: [id number]   Weight Range  171-173  Baseline proBNP   2656 on 05/01/12  Guide IT Study  HPI: Charles Cervantes is a 52 yo AAM diagnosed with systolic heart failure in 4/14 in [place] secondary to NICM (normal cors on cath), EF 20-25%. As well as polysubstance abuse (cocaine, tobacco and alcohol).   He moved from [facility] and was admitted to [name] on 05/01/12 with progressive dyspnea and orthopnea.  Echo on admit showed EF 20-25% with normal RV.  Discharge weight 147 pounds.  He also had NSVT while in house and we continued his Lifevest placed in [name].  Admitted To [name] in 8/14 with HF. EF reported 50-55% on echo. On 10/14 bedside echo in our HF Clinic EF 35-40%. In 11/14 presented to clinic c/o recurrent CP and severe HF symptoms but seemed well compensated on exam R/L cath performed as below.   ECHO  04/2012 EF 20-25% 07/17/12 EF 30-35% 08/2012 EF 50-55% at [name] 10/14 bedside echo ~35-40%  Labs 06/29/12 Potassium 5.4 Creatinine 1.05 07/17/12 Dig Level 0.5 Potassium 4.0 Creatinine 0.95 Pro BNP 244 11/20/12 K 4.0 Creatinine 1.18  RHC/LHC  11/20/12  RA = 6  RV = 33/3/8  PA = 29/9 (18)  PCW = 8  Fick cardiac output/index = 6.4/3.2  PVR = 1.5 WU  FA sat = 95%  PA sat = 72%, 73%  SVC sat 72% Extensive calcification in proximal and mid LAD with only mild intraluminal stenosis with 20-30% stenosis.   He returns for follow up. Last visit losartan was increased to 100 mg daily. Complains of fatigue and mild dyspnea on exertion. Able to walk 35-40 feet then has to stop.  Denies PND/Orthopnea. Occasional edema Smokes 4-5 cigaretters per day. No alcohol or drug use. He ran out of losartan for 3 days but was given a prescription for lisinopril. Weight at home 190-192. Drinking 2 energy drinks a few times a week.     ROS: All systems negative except as listed in HPI, PMH and Problem List.  Past Medical History  Diagnosis Date  . Hypertension    . Hyperlipidemia   . CHF (congestive heart failure)   . C. difficile diarrhea     Current Outpatient Prescriptions  Medication Sig Dispense Refill  . carvedilol (COREG) 6.25 MG tablet Take 9.375 mg by mouth 2 (two) times daily.      . furosemide (LASIX) 40 MG tablet Take 40 mg by mouth daily.      . hydrOXYzine (VISTARIL) 50 MG capsule Take 50 mg by mouth at bedtime.      Marland Kitchen lisinopril (PRINIVIL,ZESTRIL) 10 MG tablet Take 10 mg by mouth daily.      Marland Kitchen omeprazole (PRILOSEC) 40 MG capsule Take 40 mg by mouth 2 (two) times daily.      . sertraline (ZOLOFT) 50 MG tablet Take 50 mg by mouth daily.      Marland Kitchen spironolactone (ALDACTONE) 25 MG tablet Take 25 mg by mouth daily.       No current facility-administered medications for this encounter.     PHYSICAL EXAM: Filed Vitals:   12/11/12 1120  BP: 140/88  Pulse: 66  Weight: 192 lb (87.091 kg)  SpO2: 100%   General:  Well appearing. No resp difficulty HEENT: normal Neck: supple. JVP 6-7. Carotids 2+ bilaterally; no bruits. No lymphadenopathy or thryomegaly appreciated. Cor: PMI normal. Regular rate & rhythm. No rubs, or murmurs.  Lungs: clear Abdomen: soft, nontender, nondistention. No hepatosplenomegaly. No bruits or masses. Good bowel sounds. Extremities: no cyanosis, clubbing, rash, edema Neuro: alert & orientedx3, cranial nerves grossly intact. Moves all 4 extremities w/o difficulty. Affect pleasant.   ASSESSMENT & PLAN: 1) Chronic systolic HF, NICM . ECHO EF ~35-40% per cath. RHC/LHC completed 11/16/12  Fick Output 6.4/3.2 with nonobstructive CAD. NYHA II.   -Volume status stable despite weight gain.  Continue lasix as needed and spironolactone 25 mg daily.  -Continue carvedilol 9.375 mg twice a day. - At the last office visit he was on losartan 100 mg daily but in the last couple of weeks he was switched to lisinopril by his pharmacist. In the past lisinopril was stopped due to a cough. As he is currently tolerating lisinopril  will increase lisinopril to 10 mg twice a day. Check BMET in 1 week. Reinforced daily weights, low salt food choices, limiting fluid intake to < 2 liters per day. I have told him to stop drinking energy drinks.  Repeat ECHO at next visit.   2) Chest pain - resolved. 11/20/12 LHC nonobstructive CAD. Continue lipitor 20 mg daily.  3) Polysubstance abuse - remains drug and ETOH free and attending AA meetings and group sessions. 4) Current smoker Smokes 5 cigarettes per day. Declines smoking cessation 5) HTN Increase lisinopril to 10 mg twice a day. Check BMET in one week.  Follow up in 2 months with an ECHO.  Cervantes,Charles, Cervantes 11:41 AM  Patient seen and examined with Charles Becket, NP. We discussed all aspects of the encounter. I agree with the assessment and plan as stated above.   Feeling a bit better but still c/o NYHA III symptoms despite relatively normal hemodynamics on cath. We reviewed his results at length and I reassured him. If symptoms persist may need CPX test to further evaluate. Agree with med changes as described above. Will repeat ECHO at next visit. As EF > 35% no longer needs LifeVest. Counseled on need to stomp smoking. Reports he is drug and ETOH free.   Total time 50 minutes with over 2/3 of that time spent reviewing above data.  Charles Facundo,MD 3:56 PM

## 2012-12-11 NOTE — Patient Instructions (Signed)
Follow up in 2 months with an ECHO  Take lisinopril 10 mg in am and 10 mg in pm  Do the following things EVERYDAY: 1) Weigh yourself in the morning before breakfast. Write it down and keep it in a log. 2) Take your medicines as prescribed 3) Eat low salt foods-Limit salt (sodium) to 2000 mg per day.  4) Stay as active as you can everyday 5) Limit all fluids for the day to less than 2 liters

## 2012-12-18 ENCOUNTER — Encounter: Payer: Self-pay | Admitting: Internal Medicine

## 2012-12-18 ENCOUNTER — Other Ambulatory Visit: Payer: Self-pay

## 2013-01-01 ENCOUNTER — Ambulatory Visit (HOSPITAL_COMMUNITY)
Admission: RE | Admit: 2013-01-01 | Discharge: 2013-01-01 | Disposition: A | Discharge status: Home / Self Care | Payer: Medicaid Other | Source: Ambulatory Visit | Attending: Internal Medicine | Admitting: Internal Medicine

## 2013-01-01 VITALS — BP 96/62 | HR 64 | Wt 179.2 lb

## 2013-01-01 DIAGNOSIS — I10 Essential (primary) hypertension: Secondary | ICD-10-CM | POA: Insufficient documentation

## 2013-01-01 DIAGNOSIS — F17200 Nicotine dependence, unspecified, uncomplicated: Secondary | ICD-10-CM

## 2013-01-01 DIAGNOSIS — I5022 Chronic systolic (congestive) heart failure: Secondary | ICD-10-CM | POA: Insufficient documentation

## 2013-01-01 DIAGNOSIS — F1010 Alcohol abuse, uncomplicated: Secondary | ICD-10-CM

## 2013-01-01 MED ORDER — LISINOPRIL 10 MG PO TABS: 10.0000 mg | ORAL_TABLET | Freq: Every day | ORAL | Status: DC

## 2013-01-01 NOTE — Progress Notes (Signed)
Patient ID: Charles Cervantes, male   DOB: 1960/02/07, 52 y.o.   MRN: [id number]   Weight Range  171-173  Baseline proBNP   2656 on 05/01/12  Guide IT Study  HPI: Mr Hinz is a 52 yo AAM diagnosed with systolic heart failure in 04/2012 in [place] secondary to NICM (normal cors on cath), EF 20-25%. As well as polysubstance abuse (cocaine, tobacco and alcohol).   He moved from [facility] and was admitted to [facility] on 05/01/12 with progressive dyspnea and orthopnea.  Echo on admit showed EF 20-25% with normal RV.  Discharge weight 147 pounds.  He also had NSVT while in house and we continued his Lifevest placed in [place].  Admitted To [facility] in 8/14 with HF. EF reported 50-55% on echo. On 10/2012 bedside echo in our HF Clinic EF 35-40%. In 11/2012 presented to clinic c/o recurrent CP and severe HF symptoms but seemed well compensated on exam R/L cath performed as below.  ECHO  04/2012 EF 20-25% 07/17/12 EF 30-35% 08/2012 EF 50-55% at [facility] 10/14 bedside echo ~35-40%  RHC/LHC  11/20/12  RA = 6  RV = 33/3/8  PA = 29/9 (18)  PCW = 8  Fick cardiac output/index = 6.4/3.2  PVR = 1.5 WU  FA sat = 95%  PA sat = 72%, 73%  SVC sat 72% Extensive calcification in proximal and mid LAD with only mild intraluminal stenosis with 20-30% stenosis.   Acute visit: Patient walked into clinic and reports that he feels awful and he is SOB with walking minimal distance. Reports very fatigued and feels awful. Denies CP or orthopnea. After lengthy discussion patient is not taking medications correctly and is taking carvedilol only once a day and is taking lisinopril 20 mg daily, instead of 10 mg BID. Smokes 4 cigarettes a day and trying to quit. Denies ETOH or drug use. Weight at home 180 lbs.   Labs 06/29/12 Potassium 5.4 Creatinine 1.05 07/17/12 Dig Level 0.5 Potassium 4.0 Creatinine 0.95 Pro BNP 244 11/20/12 K 4.0 Creatinine 1.18  ROS: All systems negative except as listed in HPI, PMH and Problem List.  Past Medical History   Diagnosis Date  . Hypertension   . Hyperlipidemia   . CHF (congestive heart failure)   . C. difficile diarrhea     Current Outpatient Prescriptions  Medication Sig Dispense Refill  . atorvastatin (LIPITOR) 20 MG tablet Take 1 tablet (20 mg total) by mouth daily.  30 tablet  6  . carvedilol (COREG) 6.25 MG tablet Take 6.25 mg by mouth 2 (two) times daily.       . furosemide (LASIX) 40 MG tablet Take 40 mg by mouth daily.      . hydrOXYzine (VISTARIL) 50 MG capsule Take 50 mg by mouth at bedtime.      Marland Kitchen lisinopril (PRINIVIL,ZESTRIL) 10 MG tablet Take 1 tablet (10 mg total) by mouth 2 (two) times daily.  60 tablet  6  . omeprazole (PRILOSEC) 40 MG capsule Take 40 mg by mouth 2 (two) times daily.      . sertraline (ZOLOFT) 50 MG tablet Take 50 mg by mouth daily.      Marland Kitchen spironolactone (ALDACTONE) 25 MG tablet Take 25 mg by mouth daily.       No current facility-administered medications for this encounter.      Filed Vitals:   01/01/13 1405 01/01/13 1406 01/01/13 1414 01/01/13 1415  BP: 140/69 117/70 96/62 96/62   Pulse: 60 63 64 64  Weight:  179 lb 4 oz (81.307 kg) 179 lb 4 oz (81.307 kg)  SpO2:   99% 99%   PHYSICAL EXAM: General:  Well appearing. No resp difficulty HEENT: normal Neck: supple. JVP flat. Carotids 2+ bilaterally; no bruits. No lymphadenopathy or thryomegaly appreciated. Cor: PMI normal. Regular rate & rhythm. No rubs, or murmurs.  Lungs: clear Abdomen: soft, nontender, nondistention. No hepatosplenomegaly. No bruits or masses. Good bowel sounds. Extremities: no cyanosis, clubbing, rash, edema Neuro: alert & orientedx3, cranial nerves grossly intact. Moves all 4 extremities w/o difficulty. Affect pleasant.   ASSESSMENT & PLAN:  1) Chronic systolic HF: NICM, EF ~35-40% per cath (11/2012)  - NYHA II-III symptoms. Volume status stable will continue lasix 40 mg daily. Reviewed labs from 12/18/12 and K+ 4.7, Cr 0.96 and pro-BNP 22.96. - After long discussion with  patient found out he has not been taking medications as prescribed and think he has been taking carvedilol once a day and lisinopril once a day. Have asked him to please start taking coreg 6.25 mg BID instead of 9.375 mg BID (because he was only taking 6.25 mg daily) and to take lisinopril only once a day 10 mg. He reports he understands, however am concerned that he is still confused about medications and will need continued education. He was orthostatic today on exam, but not totally clear what he was taking so have asked him to take meds as above and if still having issues to call us. May need to cut spiro back to 12.5 mg daily. - He has some post nasal drainage and think he is not feeling well related to a cold. - If he continues to have NYHA III symptoms will need to schedule CPX to further evaluate since last cath showed normal hemodynamics.  - Reinforced the need and importance of daily weights, a low sodium diet, and fluid restriction (less than 2 L a day). Instructed to call the HF clinic if weight increases more than 3 lbs overnight or 5 lbs in a week.   2) Polysubstance abuse - remains drug and ETOH free and attending AA meetings and group sessions weekly. 3) Current smoker - continues to smoke about 4-5 cigarettes a day. Encouraged to continue to try to quit.  4) HTN - As above patient has not been taking medications as prescribed and will start back at square one and adjust as needed. He also has been having some dizziness and orthostatic BP changes. Continue to follow.  F/U 2 months with ECHO  Aundria Rud, NP-C 2:18 PM

## 2013-01-01 NOTE — Patient Instructions (Signed)
Take your medications as prescribed.  Start taking lisinopril 10 mg daily.  Start taking your coreg 6.25 mg (1 tablet) twice a day.  F/U 2 months with ECHO  Do the following things EVERYDAY: 1) Weigh yourself in the morning before breakfast. Write it down and keep it in a log. 2) Take your medicines as prescribed 3) Eat low salt foods-Limit salt (sodium) to 2000 mg per day.  4) Stay as active as you can everyday 5) Limit all fluids for the day to less than 2 liters 6)

## 2013-02-18 ENCOUNTER — Ambulatory Visit (HOSPITAL_COMMUNITY)
Admission: RE | Admit: 2013-02-18 | Discharge: 2013-02-18 | Disposition: A | Discharge status: Home / Self Care | Payer: Medicaid Other | Source: Ambulatory Visit | Attending: Internal Medicine | Admitting: Internal Medicine

## 2013-02-18 ENCOUNTER — Telehealth: Payer: Self-pay | Admitting: Licensed Clinical Social Worker

## 2013-02-18 ENCOUNTER — Encounter: Payer: Self-pay | Admitting: Licensed Clinical Social Worker

## 2013-02-18 VITALS — BP 152/98 | HR 68 | Wt 191.2 lb

## 2013-02-18 DIAGNOSIS — I509 Heart failure, unspecified: Secondary | ICD-10-CM | POA: Insufficient documentation

## 2013-02-18 DIAGNOSIS — R0982 Postnasal drip: Secondary | ICD-10-CM | POA: Insufficient documentation

## 2013-02-18 DIAGNOSIS — R0989 Other specified symptoms and signs involving the circulatory and respiratory systems: Secondary | ICD-10-CM

## 2013-02-18 DIAGNOSIS — I5022 Chronic systolic (congestive) heart failure: Secondary | ICD-10-CM | POA: Insufficient documentation

## 2013-02-18 DIAGNOSIS — Z87891 Personal history of nicotine dependence: Secondary | ICD-10-CM | POA: Insufficient documentation

## 2013-02-18 DIAGNOSIS — R0683 Snoring: Secondary | ICD-10-CM

## 2013-02-18 DIAGNOSIS — I10 Essential (primary) hypertension: Secondary | ICD-10-CM

## 2013-02-18 DIAGNOSIS — F17200 Nicotine dependence, unspecified, uncomplicated: Secondary | ICD-10-CM

## 2013-02-18 DIAGNOSIS — R0609 Other forms of dyspnea: Secondary | ICD-10-CM

## 2013-02-18 MED ORDER — SPIRONOLACTONE 25 MG PO TABS
25.0000 mg | ORAL_TABLET | Freq: Every day | ORAL | Status: DC
Start: 2013-02-18 — End: 2013-04-22

## 2013-02-18 MED ORDER — LISINOPRIL 10 MG PO TABS: 10.0000 mg | ORAL_TABLET | Freq: Every day | ORAL | Status: DC

## 2013-02-18 NOTE — Progress Notes (Signed)
CSW referred to follow up with patient regarding medication assistance. CSW met with patient in the cinic. Patient reports that he lives with his Aunt and she assists him financially as he is unable to work. 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 He states that he has been clean since 2014 from drugs and alcohol and attends [employer] for substance abuse support and meetings since April 2014. He states that he applied for Social Security Disability and was denied in September 2014 and currently has an appeal with [employer]. Patients reports that he has 2 medications at [facility] and with his medicaid he is responsible for $6 co-pay. Patient reports that he will ask his Aunt/family for assistance with the co-pays and if unable will contact CSW for further assistance. Raquel Sarna, [place]

## 2013-02-18 NOTE — Telephone Encounter (Signed)
CSW opened note in error. Charles Cervantes, [place]

## 2013-02-18 NOTE — Addendum Note (Signed)
Encounter addended by: Scarlette Calico, RN on: 02/18/2013 12:14 PM<BR>     Documentation filed: Orders

## 2013-02-18 NOTE — Progress Notes (Signed)
Patient ID: Charles Cervantes, male   DOB: 1960-10-04, 53 y.o.   MRN: [id number]  Weight Range  171-173  Baseline proBNP   2656 on 05/01/12  Guide IT Study  HPI: Charles Cervantes is a 53 yo AAM diagnosed with systolic heart failure in 04/2012 in [place] secondary to NICM (normal cors on cath), EF 20-25%. As well as polysubstance abuse (cocaine, tobacco and alcohol).   He moved from [place] and was admitted to [facility] on 05/01/12 with progressive dyspnea and orthopnea.  Echo on admit showed EF 20-25% with normal RV.  Discharge weight 147 pounds.  He also had NSVT while in house and we continued his Lifevest placed in [place].  Admitted To [facility] in 8/14 with HF. EF reported 50-55% on echo. On 10/2012 bedside echo in our HF Clinic EF 35-40%. In 11/2012 presented to clinic c/o recurrent CP and severe HF symptoms but seemed well compensated on exam R/L cath performed as below.  ECHO  04/2012 EF 20-25% 07/17/12 EF 30-35% 08/2012 EF 50-55% at [facility] 10/14 bedside echo ~35-40%  RHC/LHC  11/20/12  RA = 6  RV = 33/3/8  PA = 29/9 (18)  PCW = 8  Fick cardiac output/index = 6.4/3.2  PVR = 1.5 WU  FA sat = 95%  PA sat = 72%, 73%  SVC sat 72% Extensive calcification in proximal and mid LAD with only mild intraluminal stenosis with 20-30% stenosis.   Follow up: Last visit asked patient to start taking medications correctly and take coreg 6.25 mg BID, lisinopril 10 mg QD and spironolactone 25 mg daily. Reports not feeling well for over a month. Has been out of Lipitor, lisinopril and Arlyce Harman. Can walk about 40-50 ft before SOB. + Chest tightness 2-3 weeks, sitting makes it better. Still smoking about 3 cigarettes a day.  Denies ETOH or drug use and still attending [place] meetings. Weight at home 180 lbs. Reports diastolic pressure at home 90-100s.   Labs 06/29/12 Potassium 5.4 Creatinine 1.05 07/17/12 Dig Level 0.5 Potassium 4.0 Creatinine 0.95 Pro BNP 244 11/20/12 K 4.0 Creatinine 1.18 12/18/12: K 4.7, Cr 0.96  ROS: All systems  negative except as listed in HPI, PMH and Problem List.  Past Medical History  Diagnosis Date  . Hypertension   . Hyperlipidemia   . CHF (congestive heart failure)   . C. difficile diarrhea     Current Outpatient Prescriptions  Medication Sig Dispense Refill  . carvedilol (COREG) 6.25 MG tablet Take 6.25 mg by mouth 2 (two) times daily.       . furosemide (LASIX) 40 MG tablet Take 40 mg by mouth daily.       No current facility-administered medications for this encounter.    Filed Vitals:   02/18/13 1036  BP: 152/98  Pulse: 68  Weight: 191 lb 4 oz (86.75 kg)  SpO2: 99%   PHYSICAL EXAM: General:  Well appearing. No resp difficulty HEENT: normal Neck: supple. JVP 7. Carotids 2+ bilaterally; no bruits. No lymphadenopathy or thryomegaly appreciated. Cor: PMI normal. Regular rate & rhythm. No rubs, or murmurs.  Lungs: clear Abdomen: soft, nontender, nondistention. No hepatosplenomegaly. No bruits or masses. Good bowel sounds. Extremities: no cyanosis, clubbing, rash, edema Neuro: alert & orientedx3, cranial nerves grossly intact. Moves all 4 extremities w/o difficulty. Affect pleasant.   ASSESSMENT & PLAN:  1) Chronic systolic HF: NICM, EF ~10-25% per cath (11/2012)  - NYHA II-III symptoms. Volume status stable will continue lasix 40 mg daily. Reviewed labs from 12/18/12 and  K+ 4.7, Cr 0.96 and pro-BNP 22.96. - After long discussion with patient found out he has not been taking medications as prescribed and think he has been taking carvedilol once a day and lisinopril once a day. Have asked him to please start taking coreg 6.25 mg BID instead of 9.375 mg BID (because he was only taking 6.25 mg daily) and to take lisinopril only once a day 10 mg. He reports he understands, however am concerned that he is still confused about medications and will need continued education. He was orthostatic today on exam, but not totally clear what he was taking so have asked him to take meds as  above and if still having issues to call us. May need to cut spiro back to 12.5 mg daily. - He has some post nasal drainage and think he is not feeling well related to a cold. - If he continues to have NYHA III symptoms will need to schedule CPX to further evaluate since last cath showed normal hemodynamics.  - Reinforced the need and importance of daily weights, a low sodium diet, and fluid restriction (less than 2 L a day). Instructed to call the HF clinic if weight increases more than 3 lbs overnight or 5 lbs in a week.   2) Polysubstance abuse - remains drug and ETOH free and attending [facility] meetings and group sessions weekly. 3) Current smoker - continues to smoke about 4-5 cigarettes a day. Encouraged to continue to try to quit.  4) HTN - As above patient has not been taking medications as prescribed and will start back at square one and adjust as needed. He also has been having some dizziness and orthostatic BP changes. Continue to follow.  F/U 2 months with ECHO  Rande Brunt, NP-C 11:25 AM  Patient seen and examined with Junie Bame, NP. We discussed all aspects of the encounter. I agree with the assessment and plan as stated above.   Fluid status looks good. Reviewed cath results with him. No significant CAD. RHC ok. Symptoms far out of proportion to objective findings. Reinforced need to be complaint with medications to control BP. Once BP controlled will need CPX to further evaluate cardiopulmonary status. Will also repeat echo.   Family says he snores a lot. Will need sleep study.   Reinforced need to stop smoking.   Daniel Bensimhon,MD 11:47 AM

## 2013-02-18 NOTE — Addendum Note (Signed)
Encounter addended by: Scarlette Calico, RN on: 02/18/2013 11:56 AM<BR>     Documentation filed: Visit Diagnoses, Patient Instructions Section, Orders

## 2013-02-18 NOTE — Patient Instructions (Signed)
Your physician has recommended that you have a cardiopulmonary stress test (CPX). CPX testing is a non-invasive measurement of heart and lung function. It replaces a traditional treadmill stress test. This type of test provides a tremendous amount of information that relates not only to your present condition but also for future outcomes. This test combines measurements of you ventilation, respiratory gas exchange in the lungs, electrocardiogram (EKG), blood pressure and physical response before, during, and following an exercise protocol.  Your physician has requested that you have an echocardiogram. Echocardiography is a painless test that uses sound waves to create images of your heart. It provides your doctor with information about the size and shape of your heart and how well your heart's chambers and valves are working. This procedure takes approximately one hour. There are no restrictions for this procedure.  You have been referred to Pulmonary  We will contact you in 2 months to schedule your next appointment.

## 2013-02-27 ENCOUNTER — Ambulatory Visit (HOSPITAL_COMMUNITY): Payer: Medicaid Other

## 2013-02-27 ENCOUNTER — Other Ambulatory Visit (HOSPITAL_COMMUNITY): Payer: Self-pay | Admitting: *Deleted

## 2013-02-27 ENCOUNTER — Ambulatory Visit (HOSPITAL_COMMUNITY)
Admission: RE | Admit: 2013-02-27 | Discharge: 2013-02-27 | Disposition: A | Discharge status: Home / Self Care | Payer: Medicaid Other | Source: Ambulatory Visit | Attending: Internal Medicine | Admitting: Internal Medicine

## 2013-02-27 DIAGNOSIS — I5022 Chronic systolic (congestive) heart failure: Secondary | ICD-10-CM

## 2013-02-27 DIAGNOSIS — F1910 Other psychoactive substance abuse, uncomplicated: Secondary | ICD-10-CM | POA: Insufficient documentation

## 2013-02-27 DIAGNOSIS — R079 Chest pain, unspecified: Secondary | ICD-10-CM | POA: Insufficient documentation

## 2013-02-27 DIAGNOSIS — I10 Essential (primary) hypertension: Secondary | ICD-10-CM | POA: Insufficient documentation

## 2013-02-27 DIAGNOSIS — F17200 Nicotine dependence, unspecified, uncomplicated: Secondary | ICD-10-CM | POA: Insufficient documentation

## 2013-02-27 DIAGNOSIS — I517 Cardiomegaly: Secondary | ICD-10-CM

## 2013-02-27 DIAGNOSIS — I509 Heart failure, unspecified: Secondary | ICD-10-CM | POA: Insufficient documentation

## 2013-02-27 MED ORDER — CARVEDILOL 6.25 MG PO TABS: 6.2500 mg | ORAL_TABLET | Freq: Two times a day (BID) | ORAL | Status: DC

## 2013-02-27 NOTE — Progress Notes (Signed)
  Echocardiogram 2D Echocardiogram has been performed.  Charles Cervantes FRANCES 02/27/2013, 10:03 AM

## 2013-03-01 ENCOUNTER — Encounter (HOSPITAL_COMMUNITY): Payer: Self-pay

## 2013-03-08 ENCOUNTER — Institutional Professional Consult (permissible substitution): Payer: Self-pay | Admitting: Pulmonary Disease

## 2013-03-15 ENCOUNTER — Encounter: Payer: Self-pay | Admitting: Internal Medicine

## 2013-03-22 ENCOUNTER — Encounter: Payer: Self-pay | Admitting: Internal Medicine

## 2013-03-26 ENCOUNTER — Encounter: Payer: Self-pay | Admitting: Pulmonary Disease

## 2013-03-26 ENCOUNTER — Ambulatory Visit (INDEPENDENT_AMBULATORY_CARE_PROVIDER_SITE_OTHER): Payer: Medicaid Other | Admitting: Pulmonary Disease

## 2013-03-26 VITALS — BP 144/100 | HR 63 | Temp 97.6°F | Ht 73.5 in | Wt 189.8 lb

## 2013-03-26 DIAGNOSIS — G4733 Obstructive sleep apnea (adult) (pediatric): Secondary | ICD-10-CM

## 2013-03-26 DIAGNOSIS — F17200 Nicotine dependence, unspecified, uncomplicated: Secondary | ICD-10-CM

## 2013-03-26 NOTE — Assessment & Plan Note (Signed)
Smoking cessation was emphasized 

## 2013-03-26 NOTE — Assessment & Plan Note (Signed)
Given excessive daytime somnolence, narrow pharyngeal exam, witnessed apneas & loud snoring, obstructive sleep apnea is very likely & an overnight polysomnogram will be scheduled as a split study. The pathophysiology of obstructive sleep apnea , it's cardiovascular consequences & modes of treatment including CPAP were discused with the patient in detail & they evidenced understanding.  

## 2013-03-26 NOTE — Progress Notes (Signed)
Subjective:    Patient ID: Charles Cervantes, male    DOB: 02-07-1960, 53 y.o.   MRN: [id number]  HPI 53 yo AAM with systolic heart failure presents for evaluation of sleep disordered breathing. He was diagnosed in 04/2012 in [place] with NICM (normal cors on cath), EF 20-25% -attributed to polysubstance abuse (cocaine, tobacco and alcohol). EF has now improved to 35%. He also has hypertension and is poorly compliant with medications. His blood pressure is 140/100 today, he has only taken Coreg once a day for the past few days since he cannot obtain a refill. He has been unable to see his primary care physician for a year. He smoked as much as 1.5 packs per day and is now cut down to 5 cigarettes per day Loud snoring has been noted by family members. Epworth sleepiness score is 11/24. Bedtime is around 10 PM, sleep latency is variable up to 2 hours, he sleeps on his back with 2 pillows, reports 3-4 awakenings with sometimes increased latency to sleep again and is out of bed at 6 AM feeling tired, denies dryness of mouth or headaches. No bed partner history is available There is no history suggestive of cataplexy, sleep paralysis or parasomnias  Chest x-ray on 06/22/12 does not show infiltrates or effusions. Spirometry showed a ratio of 72, FEV1 of 1.74-48% and FVC of 2.43-52% consistent with moderate restriction.    Past Medical History  Diagnosis Date  . Hypertension   . Hyperlipidemia   . CHF (congestive heart failure)   . C. difficile diarrhea   . H/O blood clots 2012    in leg    Past Surgical History  Procedure Laterality Date  . Left second finger surgery      No Known Allergies  History   Social History  . Marital Status: Single    Spouse Name: N/A    Number of Children: 0  . Years of Education: N/A   Occupational History  . unemployed    Social History Main Topics  . Smoking status: Current Some Day Smoker -- 0.20 packs/day for 25 years    Types: Cigarettes  .  Smokeless tobacco: Never Used  . Alcohol Use: No     Comment: stopped in april 2014  . Drug Use: No  . Sexual Activity: No   Other Topics Concern  . Not on file   Social History Narrative  . No narrative on file    No family history on file.       Review of Systems  Constitutional: Negative for fever and unexpected weight change.  HENT: Negative for congestion, dental problem, ear pain, nosebleeds, postnasal drip, rhinorrhea, sinus pressure, sneezing, sore throat and trouble swallowing.   Eyes: Negative for redness and itching.  Respiratory: Negative for cough, chest tightness, shortness of breath and wheezing.   Cardiovascular: Negative for palpitations and leg swelling.  Gastrointestinal: Negative for nausea and vomiting.  Genitourinary: Negative for dysuria.  Musculoskeletal: Negative for joint swelling.  Skin: Negative for rash.  Neurological: Negative for headaches.  Hematological: Does not bruise/bleed easily.  Psychiatric/Behavioral: Negative for dysphoric mood. The patient is not nervous/anxious.        Objective:   Physical Exam  Gen. Pleasant, well-nourished, in no distress, normal affect ENT - no lesions, no post nasal drip Neck: No JVD, no thyromegaly, no carotid bruits Lungs: no use of accessory muscles, no dullness to percussion, clear without rales or rhonchi  Cardiovascular: Rhythm regular, heart sounds  normal,  no murmurs or gallops, no peripheral edema Abdomen: soft and non-tender, no hepatosplenomegaly, BS normal. Musculoskeletal: No deformities, no cyanosis or clubbing Neuro:  alert, non focal       Assessment & Plan:

## 2013-03-26 NOTE — Patient Instructions (Signed)
Sleep study will be scheduled Breathing test You have to QUIT smoking Get refill on carvedilol - your BP is high today

## 2013-04-15 ENCOUNTER — Other Ambulatory Visit (HOSPITAL_COMMUNITY): Payer: Self-pay | Admitting: Anesthesiology

## 2013-04-15 DIAGNOSIS — I5022 Chronic systolic (congestive) heart failure: Secondary | ICD-10-CM

## 2013-04-22 ENCOUNTER — Encounter: Payer: Self-pay | Admitting: Licensed Clinical Social Worker

## 2013-04-22 ENCOUNTER — Ambulatory Visit (HOSPITAL_COMMUNITY)
Admission: RE | Admit: 2013-04-22 | Discharge: 2013-04-22 | Disposition: A | Discharge status: Home / Self Care | Payer: Medicaid Other | Source: Ambulatory Visit | Attending: Internal Medicine | Admitting: Internal Medicine

## 2013-04-22 ENCOUNTER — Encounter (HOSPITAL_COMMUNITY): Payer: Self-pay

## 2013-04-22 VITALS — BP 164/100 | HR 78 | Wt 184.8 lb

## 2013-04-22 DIAGNOSIS — I10 Essential (primary) hypertension: Secondary | ICD-10-CM

## 2013-04-22 DIAGNOSIS — I5022 Chronic systolic (congestive) heart failure: Secondary | ICD-10-CM

## 2013-04-22 DIAGNOSIS — F17200 Nicotine dependence, unspecified, uncomplicated: Secondary | ICD-10-CM

## 2013-04-22 MED ORDER — CARVEDILOL 6.25 MG PO TABS: 6.2500 mg | ORAL_TABLET | Freq: Two times a day (BID) | ORAL | Status: DC

## 2013-04-22 MED ORDER — SPIRONOLACTONE 25 MG PO TABS: 25.0000 mg | ORAL_TABLET | Freq: Every day | ORAL | Status: DC

## 2013-04-22 NOTE — Progress Notes (Signed)
Patient ID: Charles Cervantes, male   DOB: 02-Oct-1960, 53 y.o.   MRN: [id number]   Weight Range  171-173  Baseline proBNP   2656 on 05/01/12  Guide IT Study PCP:   HPI: Charles Cervantes is a 53 yo AAM diagnosed with systolic heart failure in 04/2012 in [place] secondary to NICM (normal cors on cath), EF 20-25%. As well as polysubstance abuse (cocaine, tobacco and alcohol).   He moved from [facility] and was admitted to [facility] on 05/01/12 with progressive dyspnea and orthopnea.  Echo on admit showed EF 20-25% with normal RV.  Discharge weight 147 pounds.  He also had NSVT while in house and we continued his Lifevest placed in [place].  Admitted To [facility] in 8/14 with HF. EF reported 50-55% on echo. On 10/2012 bedside echo in our HF Clinic EF 35-40%. In 11/2012 presented to clinic c/o recurrent CP and severe HF symptoms but seemed well compensated on exam R/L cath performed as below.  ECHO  04/2012 EF 20-25% 07/17/12 EF 30-35% 08/2012 EF 50-55% at [facility] ECHO 02/27/13 EF 35-40%  RHC/LHC  11/20/12  RA = 6  RV = 33/3/8  PA = 29/9 (18)  PCW = 8  Fick cardiac output/index = 6.4/3.2  PVR = 1.5 WU  FA sat = 95%  PA sat = 72%, 73%  SVC sat 72% Extensive calcification in proximal and mid LAD with only mild intraluminal stenosis with 20-30% stenosis.    CPX 2/15 FVC 2.90 (62%)  FEV1 2.22 (60%) FEV1/FVC 76%  Resting HR: 61 Peak HR: 111 (66% age predicted max HR) BP rest: 146/90 BP peak: 168/86 Peak VO2: 22.6 (64.1% predicted peak VO2) VE/VCO2 slope: 29.4 OUES: 2.49 Peak RER: 0.90   He returns for an acute work in due to increased blood pressure. Says he did not take his carvedilol this am because he ran out. He has been out of spironolactone for 4-5 days. He has a hard time paying medications. Breathing pretty good. Says he can walk 1 mile. Still smoking about 4-5 cigarettes a day.  Denies ETOH or drug use and still attending [place] meetings. Weight at home 182-184 lbs.   Labs 06/29/12 Potassium 5.4 Creatinine  1.05 07/17/12 Dig Level 0.5 Potassium 4.0 Creatinine 0.95 Pro BNP 244 11/20/12 K 4.0 Creatinine 1.18 12/18/12: K 4.7, Cr 0.96  ROS: All systems negative except as listed in HPI, PMH and Problem List.  Past Medical History  Diagnosis Date  . Hypertension   . Hyperlipidemia   . CHF (congestive heart failure)   . C. difficile diarrhea   . H/O blood clots 2012    in leg    Current Outpatient Prescriptions  Medication Sig Dispense Refill  . carvedilol (COREG) 6.25 MG tablet Take 1 tablet (6.25 mg total) by mouth 2 (two) times daily.  60 tablet  3  . furosemide (LASIX) 40 MG tablet Take 40 mg by mouth daily.      Marland Kitchen lisinopril (PRINIVIL,ZESTRIL) 10 MG tablet Take 1 tablet (10 mg total) by mouth daily.  30 tablet  3  . spironolactone (ALDACTONE) 25 MG tablet Take 1 tablet (25 mg total) by mouth daily.  30 tablet  3   No current facility-administered medications for this encounter.    Filed Vitals:   04/22/13 1105  BP: 164/100  Pulse: 78  Weight: 184 lb 12.8 oz (83.825 kg)  SpO2: 100%   PHYSICAL EXAM: General:  Well appearing. No resp difficulty HEENT: normal Neck: supple. JVP 7. Carotids 2+  bilaterally; no bruits. No lymphadenopathy or thryomegaly appreciated. Cor: PMI normal. Regular rate & rhythm. No rubs, or murmurs.  Lungs: clear Abdomen: soft, nontender, nondistention. No hepatosplenomegaly. No bruits or masses. Good bowel sounds. Extremities: no cyanosis, clubbing, rash, edema Neuro: alert & orientedx3, cranial nerves grossly intact. Moves all 4 extremities w/o difficulty. Affect pleasant.   ASSESSMENT & PLAN:  1) Chronic systolic HF: NICM, EF ~37-16% per cath (11/2012)  - NYHA II-III symptoms. Volume status stable will continue lasix 40 mg daily. Reviewed labs from 12/18/12 and K+ 4.7, Cr 0.96 and pro-BNP 22.96. - Currently off spiro and carvedilol. He has been out of these meds and says he can not afford meds.   Volume status stable. Continue lasix 40 mg daily  . Restart carvedilol 6.25 mg twice a day.  Continue lisinopril 10 mg twice a day. -- Reinforced the need and importance of daily weights, a low sodium diet, and fluid restriction (less than 2 L a day). Instructed to call the HF clinic if weight increases more than 3 lbs overnight or 5 lbs in a week.   2) Polysubstance abuse - remains drug and ETOH free and attending [place] meetings and group sessions weekly. 3) Current smoker - continues to smoke about 4-5 cigarettes a day. Encouraged to continue to try to quit.  4) HTN - Elevated. Restart carvedilol 6.25 mg twice a day and restart spironolactone 25 mg daily. Continue lisinopril 10 mg twice a day .  5) Snore  Plan for sleep study next week.   Follow up in 2 weeks. Refer to HF SW for PCP.   Amy Estrella Deeds, NP-C 11:22 AM  Patient seen and examined with Darrick Grinder, NP. We discussed all aspects of the encounter. I agree with the assessment and plan as stated above.   HF currently stable despite being out of meds. However BP high. CPX reviewed with him in detail and no significant HF limitation. (symptoms out of proportion to objective findings)  Long discussion about need to be compliant with meds as well as stop smoking. Not candidate for pharmacy assistance program due to Medicaid. Given [employer] gift card.  Total time spent 45 minutes. Over half that time spent discussing above.   Shaune Pascal Bensimhon,MD 6:44 PM

## 2013-04-22 NOTE — Progress Notes (Signed)
CSW referred to assist patient with obtaining PCP. Patient reports that he has a PCP thru medicaid although he has been unable to "see MD since January". Patient is requesting assistance with obtaining a new PCP. Patient spoke at length about his past drug/alcohol use and events that lead to his substance abuse. Patient reports he is currently attending support for his addictions at [place]. Patient is motivated to stay clean although feeling stressors due to current health status and pending Social Security Disability application since September 2014. CSW encouraged patient to follow up with Orvan Seen who is assisting with the appeal of his Social Security. CSW left message for medicaid worker for assistance with obtaining an override # to switch PCP. CSW will refer to new PCP once return call from medicaid with needed information. CSW continues to follow and will return call to patient once information received from [place]. Raquel Sarna, [place]

## 2013-04-22 NOTE — Patient Instructions (Signed)
Follow up in 2 weeks  Do the following things EVERYDAY: 1) Weigh yourself in the morning before breakfast. Write it down and keep it in a log. 2) Take your medicines as prescribed 3) Eat low salt foods--Limit salt (sodium) to 2000 mg per day.  4) Stay as active as you can everyday 5) Limit all fluids for the day to less than 2 liters 

## 2013-04-25 ENCOUNTER — Encounter (HOSPITAL_COMMUNITY): Payer: Self-pay | Admitting: Emergency Medicine

## 2013-04-25 ENCOUNTER — Emergency Department (HOSPITAL_COMMUNITY)
Admission: EM | Admit: 2013-04-25 | Discharge: 2013-04-25 | Disposition: A | Discharge status: Home / Self Care | Payer: Medicaid Other | Attending: Emergency Medicine | Admitting: Emergency Medicine

## 2013-04-25 DIAGNOSIS — Z8619 Personal history of other infectious and parasitic diseases: Secondary | ICD-10-CM | POA: Insufficient documentation

## 2013-04-25 DIAGNOSIS — Z79899 Other long term (current) drug therapy: Secondary | ICD-10-CM | POA: Insufficient documentation

## 2013-04-25 DIAGNOSIS — Z86718 Personal history of other venous thrombosis and embolism: Secondary | ICD-10-CM | POA: Insufficient documentation

## 2013-04-25 DIAGNOSIS — F17200 Nicotine dependence, unspecified, uncomplicated: Secondary | ICD-10-CM | POA: Insufficient documentation

## 2013-04-25 DIAGNOSIS — Z8639 Personal history of other endocrine, nutritional and metabolic disease: Secondary | ICD-10-CM | POA: Insufficient documentation

## 2013-04-25 DIAGNOSIS — I509 Heart failure, unspecified: Secondary | ICD-10-CM | POA: Insufficient documentation

## 2013-04-25 DIAGNOSIS — Z862 Personal history of diseases of the blood and blood-forming organs and certain disorders involving the immune mechanism: Secondary | ICD-10-CM | POA: Insufficient documentation

## 2013-04-25 DIAGNOSIS — T783XXA Angioneurotic edema, initial encounter: Secondary | ICD-10-CM | POA: Insufficient documentation

## 2013-04-25 DIAGNOSIS — T4995XA Adverse effect of unspecified topical agent, initial encounter: Secondary | ICD-10-CM | POA: Insufficient documentation

## 2013-04-25 DIAGNOSIS — I10 Essential (primary) hypertension: Secondary | ICD-10-CM | POA: Insufficient documentation

## 2013-04-25 MED ORDER — DIPHENHYDRAMINE HCL 25 MG PO CAPS
50.0000 mg | ORAL_CAPSULE | Freq: Once | ORAL | Status: AC
  Administered 2013-04-25: 50 mg via ORAL
  Filled 2013-04-25: qty 2

## 2013-04-25 MED ORDER — ENALAPRIL MALEATE 10 MG PO TABS: 10.0000 mg | ORAL_TABLET | Freq: Every day | ORAL | Status: DC

## 2013-04-25 NOTE — ED Notes (Signed)
Pt reports swelling to left lower lip since 1330 today. Denies swelling to tongue, throat. Speaks in complete sentences. Pt denies pain.

## 2013-04-25 NOTE — ED Provider Notes (Signed)
CSN: [id number]     Arrival date & time 04/25/13  1713 History  This chart was scribed for non-physician practitioner, Junius Creamer, [place] working with Merryl Hacker, MD by Frederich Balding, ED scribe. This patient was seen in room TR06C/TR06C and the patient's care was started at 5:49 PM.   Chief Complaint  Patient presents with  . Oral Swelling   The history is provided by the patient. No language interpreter was used.   HPI Comments: Charles Cervantes is a 53 y.o. male who presents to the Emergency Department complaining of worsening left lower lip swelling that started at 1:30 PM today. He tried ice to decrease swelling with no relief. Denies trouble swallowing, tongue swelling, throat swelling. Pt is currently on lisinopril.   Past Medical History  Diagnosis Date  . Hypertension   . Hyperlipidemia   . CHF (congestive heart failure)   . C. difficile diarrhea   . H/O blood clots 2012    in leg   Past Surgical History  Procedure Laterality Date  . Left second finger surgery     History reviewed. No pertinent family history. History  Substance Use Topics  . Smoking status: Current Some Day Smoker -- 0.20 packs/day for 25 years    Types: Cigarettes  . Smokeless tobacco: Never Used  . Alcohol Use: No     Comment: stopped in april 2014    Review of Systems  Constitutional: Negative for fever.  HENT: Positive for facial swelling. Negative for trouble swallowing.   Respiratory: Negative for chest tightness and shortness of breath.   Cardiovascular: Negative for chest pain.  Neurological: Negative for dizziness.  All other systems reviewed and are negative.  Allergies  Review of patient's allergies indicates no known allergies.  Home Medications   Prior to Admission medications   Medication Sig Start Date End Date Taking? Authorizing Provider  carvedilol (COREG) 6.25 MG tablet Take 1 tablet (6.25 mg total) by mouth 2 (two) times daily. 04/22/13   Amy D Clegg, NP  furosemide  (LASIX) 40 MG tablet Take 40 mg by mouth daily. 11/16/12   Jolaine Artist, MD  lisinopril (PRINIVIL,ZESTRIL) 10 MG tablet Take 10 mg by mouth 2 (two) times daily. 02/18/13   Jolaine Artist, MD  spironolactone (ALDACTONE) 25 MG tablet Take 1 tablet (25 mg total) by mouth daily. 04/22/13   Amy D Clegg, NP   BP 128/92  Pulse 75  Temp(Src) 98.6 F (37 C) (Oral)  Resp 20  SpO2 95%  Physical Exam  Nursing note and vitals reviewed. Constitutional: He is oriented to person, place, and time. He appears well-developed and well-nourished. No distress.  HENT:  Head: Normocephalic and atraumatic.  Left lower lip swollen. No tongue swelling.   Eyes: EOM are normal.  Neck: Neck supple. No tracheal deviation present.  Cardiovascular: Normal rate.   Pulmonary/Chest: Effort normal. No respiratory distress.  Musculoskeletal: Normal range of motion.  Neurological: He is alert and oriented to person, place, and time.  Skin: Skin is warm and dry.  Psychiatric: He has a normal mood and affect. His behavior is normal.    ED Course  Procedures (including critical care time)  DIAGNOSTIC STUDIES: Oxygen Saturation is 95% on RA, adequate by my interpretation.    COORDINATION OF CARE: 5:52 PM-Discussed treatment plan which includes benadryl with pt at bedside and pt agreed to plan.   7:06 PM-Upon recheck, swelling has not decreased. Will change pt's blood pressure medication.   Labs Review  Labs Reviewed - No data to display  Imaging Review No results found.   EKG Interpretation None      MDM  Patient noticed a left side of lower lip swelling.  About 1:30 been persistent since.  Denies any extension into the mouth or tongue difficulty swallowing, shortness of breath.  He's been taking lisinopril for a number of years.  Denies any insect bite.  Different foods.  This is most likely an ACE inhibitor reaction versus allergic reaction.  Plan patient will be given, Benadryl, and observed for  short period of time Reassessed.  There was no change in his swelling around her.  Further areas of, swelling.  He's been discontinued from his lisinopril I have prescribed, Vasotec 10 mg daily for him.  He is to follow up with his primary care physician Final diagnoses:  Angioedema       I personally performed the services described in this documentation, which was scribed in my presence. The recorded information has been reviewed and is accurate.  Garald Balding, NP 04/25/13 1912

## 2013-04-25 NOTE — Discharge Instructions (Signed)
Angioedema Angioedema is sudden puffiness (swelling), often of the skin. It can happen:  On your face or privates (genitals).  In your belly (abdomen) or other body parts. It usually happens quickly and gets better in 1 or 2 days. It often starts at night and is found when you wake up. You may get red, itchy patches of skin (hives). Attacks can be dangerous if your breathing passages get puffy. The condition may happen only once, or it can come back at random times. It may happen for several years before it goes away for good. HOME CARE  Only take medicines as told by your doctor.  Always carry your emergency allergy medicines with you.  Wear a medical bracelet as told by your doctor.  Avoid things that you know will cause attacks (triggers). GET HELP IF:  You have another attack.  Your attacks happen more often or get worse.  The condition was passed to you by your parents and you want to have children. GET HELP RIGHT AWAY IF:   Your mouth, tongue, or lips are very puffy.  You have trouble breathing.  You have trouble swallowing.  You pass out (faint). MAKE SURE YOU:   Understand these instructions.  Will watch your condition.  Will get help right away if you are not doing well or get worse. Document Released: 12/08/2008 Document Revised: 10/10/2012 Document Reviewed: 08/13/2012 [facility] Patient Information 2014 [place], [place]. Please do not take anymore of your lisinopril, as you're having any adverse reaction to this Been given a new prescription for medication called Vasotec.  Please take this on a daily basis.  Please call your primary care physician to let them know about tonight.  Visit and keep your appointment, as scheduled in May.  They may wait to see, you earlier but it is at their discretion

## 2013-04-26 ENCOUNTER — Telehealth (HOSPITAL_COMMUNITY): Payer: Self-pay | Admitting: *Deleted

## 2013-04-26 MED ORDER — LOSARTAN POTASSIUM 100 MG PO TABS: 100.0000 mg | ORAL_TABLET | Freq: Every day | ORAL | Status: DC

## 2013-04-26 NOTE — Addendum Note (Signed)
Addended by: Scarlette Calico on: 04/26/2013 09:49 AM   Modules accepted: Orders

## 2013-04-26 NOTE — Telephone Encounter (Signed)
Pt stopped by clinic today to let us know about his ER visit last night for angioedema, they stopped his Lisinopril and told him to start Enalapril 10 mg daily, pt states he has not picked up prescription yet.  Discussed with pharmacist and Darrick Grinder, NP will have pt start Losartan 100 mg daily starting on Monday instead of enalapril, pt aware and agreeable, rx sent in

## 2013-04-26 NOTE — ED Provider Notes (Signed)
Medical screening examination/treatment/procedure(s) were performed by non-physician practitioner and as supervising physician I was immediately available for consultation/collaboration.   EKG Interpretation None       Merryl Hacker, MD 04/26/13 1435

## 2013-04-29 ENCOUNTER — Telehealth: Payer: Self-pay | Admitting: Licensed Clinical Social Worker

## 2013-04-29 ENCOUNTER — Ambulatory Visit (HOSPITAL_BASED_OUTPATIENT_CLINIC_OR_DEPARTMENT_OTHER): Payer: Medicaid Other | Attending: Pulmonary Disease | Admitting: Radiology

## 2013-04-29 VITALS — Ht 73.0 in | Wt 186.0 lb

## 2013-04-29 DIAGNOSIS — G4733 Obstructive sleep apnea (adult) (pediatric): Secondary | ICD-10-CM | POA: Insufficient documentation

## 2013-04-29 NOTE — Telephone Encounter (Signed)
CSW rec'd return call from [facility] caseworker Beverly Milch [phone] stating she can assist with change in PCP although patient will need to call her directly with request. CSW has attempted multiple times to reach patient with information for obtaining new PCP with no success. CSW will be available should patient return call or visit to clinic. Raquel Sarna, [place]

## 2013-05-02 ENCOUNTER — Telehealth: Payer: Self-pay | Admitting: Pulmonary Disease

## 2013-05-02 ENCOUNTER — Telehealth: Payer: Self-pay | Admitting: *Deleted

## 2013-05-02 DIAGNOSIS — G4710 Hypersomnia, unspecified: Secondary | ICD-10-CM

## 2013-05-02 DIAGNOSIS — G4730 Sleep apnea, unspecified: Secondary | ICD-10-CM

## 2013-05-02 DIAGNOSIS — G4733 Obstructive sleep apnea (adult) (pediatric): Secondary | ICD-10-CM

## 2013-05-02 NOTE — Telephone Encounter (Signed)
Even though he did not sleep very well ,PSG showed moderate to severe OSA, ahi 26/h Due to cardiac issues would suggest an in lab CPAP titration. Okay to schedule if patient is willing

## 2013-05-02 NOTE — Telephone Encounter (Signed)
lmtcb x1 with family member

## 2013-05-02 NOTE — Telephone Encounter (Signed)
Inge Rise, CMA at 05/02/2013  2:01 PM      Status: Signed            lmtcb x1 with family member         Rigoberto Noel, MD at 05/02/2013  1:40 PM      Status: Signed            Even though he did not sleep very well ,PSG showed moderate to severe OSA, ahi 26/h Due to cardiac issues would suggest an in lab CPAP titration. Okay to schedule if patient is willing

## 2013-05-02 NOTE — Sleep Study (Signed)
Charles Cervantes   NAME: Charles Cervantes  DATE OF BIRTH: 1960/02/28  MEDICAL RECORD [id number]  LOCATION: [facility]   PHYSICIAN: ALVA,RAKESH V.   DATE OF STUDY: 04/29/13   SLEEP STUDY TYPE: Nocturnal Polysomnogram   REFERRING PHYSICIAN: Rigoberto Noel, MD   INDICATION FOR STUDY:  53 year old man with nonischemic cardio myopathy presents for evaluation of loud snoring and excessive daytime somnolence At the time of this study ,they weighed 186 pounds with a height of 6 ft 1 inches and the BMI of 25, neck size of 14.5 inches. Epworth sleepiness score was 18   This nocturnal polysomnogram was performed with a sleep technologist in attendance. EEG, EOG,EMG and respiratory parameters recorded. Sleep stages, arousals, limb movements and respiratory data was scored according to criteria laid out by the American Academy of sleep medicine.   SLEEP ARCHITECTURE: Lights out was at 2219 PM and lights on was at 5 AM. Total sleep time was 138 minutes with a sleep period time of 403 minutes and a sleep efficiency of 35 %. Sleep latency was 200 minutes with latency to REM sleep of 226 minutes and wake after sleep onset of 254 minutes. . Sleep stages as a percentage of total sleep time was N1 -25 %,N2- 49 % and REM sleep 26 % ( 36 minutes) . The longest period of REM sleep was around 2 AM.   AROUSAL DATA : There were 93  arousals with an arousal index of 40 events per hour. Most of these were spontaneous & 43 were associated with respiratory events  RESPIRATORY DATA: There were 30 obstructive apneas, 0 central apneas,4 mixed apneas and 26 hypopneas with apnea -hypopnea index of 26 events per hour. There were 39 RERAs with an RDI of 43 events per hour. There was no relation to sleep stage or body position. Supine sleep was noted  MOVEMENT/PARASOMNIA: There were 0 PLMS with a PLM index of 0 events per hour. The PLM arousal index was 0 per hour.  OXYGEN DATA: The  lowest desaturation was 87 % during REM sleep and the desaturation index was 25 per hour.   CARDIAC DATA: The low heart rate was 40 beats per minute. The high heart rate recorded was an artifact. No arrhythmias were noted   DISCUSSION -Loud snoring was noted . He did not meet criteria for CPAP intervention due to prolonged sleep latency. He was desensitized with a medium fullface mask  IMPRESSION :  1. moderate to severe obstructive sleep apnea with hypopneas causing sleep fragmentation and mild oxygen desaturation.  2. No evidence of cardiac arrhythmias,periodic limb movements or behavioral disturbance during sleep.  3. Sleep efficiency was poor due to prolonged sleep latency  RECOMMENDATION:  1. Treatment options for this degree of sleep disordered breathing include weight loss and CPAP therapy. Due to cardiac issues would suggest an in lab CPAP titration. 2. Patient should be cautioned against driving when sleepy  3. They should be asked to avoid medications with sedative side effects    Rigoberto Noel MD Diplomate, American Board of Sleep Medicine    ELECTRONICALLY SIGNED ON: 05/02/2013  [facility]  PH: [phone] FX: [phone]  [place]

## 2013-05-06 ENCOUNTER — Encounter (HOSPITAL_COMMUNITY): Payer: Self-pay

## 2013-05-06 NOTE — Telephone Encounter (Signed)
lmtcb x2 w/ family member 

## 2013-05-08 NOTE — Telephone Encounter (Signed)
lmtcb x3 w/ family member

## 2013-05-09 ENCOUNTER — Telehealth: Payer: Self-pay | Admitting: Licensed Clinical Social Worker

## 2013-05-09 NOTE — Telephone Encounter (Signed)
lmtcb x4 w/ family member as pt was not in. Advised importance for him to give me a call

## 2013-05-09 NOTE — Telephone Encounter (Signed)
CSW rec'd return call from patient. CSW informed patient to contact medicaid worker Beverly Milch at [phone] with request to change PCP in medicaid system to [facility] per his request for change in primary care. Patient verbalizes understanding of needed follow up. CSW will follow up with patient on next clinic visit. Raquel Sarna, [place]

## 2013-05-13 ENCOUNTER — Encounter: Payer: Self-pay | Admitting: *Deleted

## 2013-05-13 NOTE — Telephone Encounter (Signed)
lmtcb w. Family member. Will send pt a letter.

## 2013-05-14 NOTE — Progress Notes (Signed)
Patient ID: Charles Cervantes, male   DOB: [birth year], 53 y.o.   MRN: [id number]  Guide IT Study PCP: None Pulmonology: Dr Elsworth Soho  HPI: Charles Cervantes is a 53 yo AAM diagnosed with systolic heart failure in 04/2012 in [place] secondary to NICM (normal cors on cath), EF 20-25%. As well as polysubstance abuse (cocaine, tobacco and alcohol)--> quit in 04/2012.   He moved from [facility] and was admitted to [facility] on 05/01/12 with progressive dyspnea and orthopnea.  Echo on admit showed EF 20-25% with normal RV.  Discharge weight 147 pounds.  He also had NSVT while in house and we continued his Lifevest placed in [place].  Admitted To [facility] in 8/14 with HF. EF reported 50-55% on echo. On 10/2012 bedside echo in our HF Clinic EF 35-40%. In 11/2012 presented to clinic c/o recurrent CP and severe HF symptoms but seemed well compensated on exam R/L cath performed as below.  He returns for follow up.  Last visit HF meds restarted. Overall he is feeling good. Had sleep study 04/29/13 with evidence of sleep apnea. Plans for CPAP per Dr Elsworth Soho. Denies SOB/PND/Orthopnea. Does admit to mild dyspnea . Weight 180-183 pounds. Has been 1 year without alcohol and drugs. Smoking 1-2 cigarettes. Still attending substance abuse classes. Waiting of disability.    ECHO  04/2012 EF 20-25% 07/17/12 EF 30-35% 08/2012 EF 50-55% at [facility] ECHO 02/27/13 EF 35-40%  RHC/LHC  11/20/12  RA = 6  RV = 33/3/8  PA = 29/9 (18)  PCW = 8  Fick cardiac output/index = 6.4/3.2  PVR = 1.5 WU  FA sat = 95%  PA sat = 72%, 73%  SVC sat 72% Extensive calcification in proximal and mid LAD with only mild intraluminal stenosis with 20-30% stenosis.    CPX 2/15 FVC 2.90 (62%)  FEV1 2.22 (60%) FEV1/FVC 76%  Resting HR: 61 Peak HR: 111 (66% age predicted max HR) BP rest: 146/90 BP peak: 168/86 Peak VO2: 22.6 (64.1% predicted peak VO2) VE/VCO2 slope: 29.4 OUES: 2.49 Peak RER: 0.90  Labs 06/29/12 Potassium 5.4 Creatinine 1.05 07/17/12 Dig Level 0.5 Potassium 4.0  Creatinine 0.95 Pro BNP 244 11/20/12 K 4.0 Creatinine 1.18 12/18/12: K 4.7, Cr 0.96  ROS: All systems negative except as listed in HPI, PMH and Problem List.  Past Medical History  Diagnosis Date  . Hypertension   . Hyperlipidemia   . CHF (congestive heart failure)   . C. difficile diarrhea   . H/O blood clots 2012    in leg    Current Outpatient Prescriptions  Medication Sig Dispense Refill  . carvedilol (COREG) 6.25 MG tablet Take 1 tablet (6.25 mg total) by mouth 2 (two) times daily.  60 tablet  3  . furosemide (LASIX) 40 MG tablet Take 40 mg by mouth daily.      Marland Kitchen losartan (COZAAR) 100 MG tablet Take 1 tablet (100 mg total) by mouth daily.  30 tablet  3  . spironolactone (ALDACTONE) 25 MG tablet Take 1 tablet (25 mg total) by mouth daily.  30 tablet  3   No current facility-administered medications for this encounter.    Filed Vitals:   05/16/13 1128  BP: 116/81  Pulse: 63  Weight: 185 lb (83.915 kg)  SpO2: 99%   PHYSICAL EXAM: General:  Well appearing. No resp difficulty HEENT: normal Neck: supple. JVP 7. Carotids 2+ bilaterally; no bruits. No lymphadenopathy or thryomegaly appreciated. Cor: PMI normal. Regular rate & rhythm. No rubs, or murmurs.  Lungs: clear  Abdomen: soft, nontender, nondistention. No hepatosplenomegaly. No bruits or masses. Good bowel sounds. Extremities: no cyanosis, clubbing, rash, edema Neuro: alert & orientedx3, cranial nerves grossly intact. Moves all 4 extremities w/o difficulty. Affect pleasant.   ASSESSMENT & PLAN:  1) Chronic systolic HF: NICM, EF ~29-79% per cath (11/2012)  - NYHA II symptoms. Volume status stable will continue lasix 40 mg daily.  Volume status stable. Continue lasix 40 mg daily . Continue carvedilol 6.25 mg twice a day.  Continue losartan 100 mg daily. Check BMET today. . -- Reinforced the need and importance of daily weights, a low sodium diet, and fluid restriction (less than 2 L a day). Instructed to call the  HF clinic if weight increases more than 3 lbs overnight or 5 lbs in a week.   2) Polysubstance abuse - remains drug and ETOH free and attending [place] meetings and group sessions weekly. 3) Current smoker - continues to smoke about 1-2 cigarettes a day. Encouraged to continue to try to quit.  4) HTN - Stable.  Continue carvedilol 6.25 mg twice a day, spironolactone 25 mg daily, and losartan 100 mg a day.   5) OSA Had sleep study in April. Results per Dr Elsworth Soho.    Follow up in 3 months. HF SW to follow up regarding PCP.   Amy D Ninfa Meeker, NP-C 11:29 AM

## 2013-05-15 ENCOUNTER — Telehealth: Payer: Self-pay | Admitting: Licensed Clinical Social Worker

## 2013-05-15 NOTE — Telephone Encounter (Signed)
Patient called to inform CSW that he called the medicaid worker to request change in PCP. CSW will leave contact information for patient in clinic for tomorrow's appointment. CSW continues to be available as needed. Raquel Sarna, [place]

## 2013-05-16 ENCOUNTER — Ambulatory Visit (HOSPITAL_COMMUNITY)
Admission: RE | Admit: 2013-05-16 | Discharge: 2013-05-16 | Disposition: A | Discharge status: Home / Self Care | Payer: Medicaid Other | Source: Ambulatory Visit | Attending: Internal Medicine | Admitting: Internal Medicine

## 2013-05-16 VITALS — BP 116/81 | HR 63 | Wt 185.0 lb

## 2013-05-16 DIAGNOSIS — I5022 Chronic systolic (congestive) heart failure: Secondary | ICD-10-CM | POA: Insufficient documentation

## 2013-05-16 DIAGNOSIS — I10 Essential (primary) hypertension: Secondary | ICD-10-CM | POA: Insufficient documentation

## 2013-05-16 DIAGNOSIS — F17200 Nicotine dependence, unspecified, uncomplicated: Secondary | ICD-10-CM | POA: Insufficient documentation

## 2013-05-16 DIAGNOSIS — F1910 Other psychoactive substance abuse, uncomplicated: Secondary | ICD-10-CM | POA: Insufficient documentation

## 2013-05-16 LAB — BASIC METABOLIC PANEL
BUN: 10 mg/dL (ref 6–23)
CO2: 23 mEq/L (ref 19–32)
Calcium: 8.9 mg/dL (ref 8.4–10.5)
Chloride: 102 mEq/L (ref 96–112)
Creatinine, Ser: 0.91 mg/dL (ref 0.50–1.35)
GFR calc Af Amer: >90 mL/min (ref >90)
Glucose, Bld: 110 mg/dL — ABNORMAL HIGH (ref 70–99)
Potassium: 4.5 mEq/L (ref 3.7–5.3)
SODIUM: 136 meq/L — AB (ref 137–147)

## 2013-05-16 NOTE — Telephone Encounter (Signed)
Pt has called back to get his sleep study results - [phone]. This is # for [facility]. 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 He will be there until 4:00.

## 2013-05-16 NOTE — Patient Instructions (Signed)
Follow up in 3 months   Do the following things EVERYDAY: 1) Weigh yourself in the morning before breakfast. Write it down and keep it in a log. 2) Take your medicines as prescribed 3) Eat low salt foods-Limit salt (sodium) to 2000 mg per day.  4) Stay as active as you can everyday 5) Limit all fluids for the day to less than 2 liters  

## 2013-05-16 NOTE — Telephone Encounter (Signed)
Pt states he has no minutes on his phone. He can not talk.

## 2013-05-16 NOTE — Telephone Encounter (Signed)
ATC pt at number given ([phone]).  NA and unable to leave message.

## 2013-05-21 NOTE — Telephone Encounter (Signed)
lmomtcb x1. Will make RA aware I can;t get in touch with pt and have already sent a letter to him.

## 2013-05-21 NOTE — Telephone Encounter (Signed)
ok 

## 2013-05-23 ENCOUNTER — Encounter: Payer: Self-pay | Admitting: *Deleted

## 2013-05-23 NOTE — Telephone Encounter (Signed)
lmtcb x2--letter has been sent to pt again

## 2013-06-03 ENCOUNTER — Encounter: Payer: Self-pay | Admitting: Internal Medicine

## 2013-06-06 ENCOUNTER — Telehealth: Payer: Self-pay | Admitting: Pulmonary Disease

## 2013-06-06 DIAGNOSIS — G4733 Obstructive sleep apnea (adult) (pediatric): Secondary | ICD-10-CM

## 2013-06-06 NOTE — Telephone Encounter (Signed)
Charles Noel, MD at 05/02/2013 1:40 PM  Status: Signed  Even though he did not sleep very well ,PSG showed moderate to severe OSA, ahi 26/h  Due to cardiac issues would suggest an in lab CPAP titration.  Okay to schedule if patient is willing  ----  I called and spoke with pt. Aware of results. Order has been placed for CPAP titration study. Aware PCC's will call him. Nothing further needed Pt requests a call on [phone]. Please advise PCC's thanks

## 2013-06-07 NOTE — Telephone Encounter (Signed)
Study scheduled for Tues 06/20/13 at 8:00 pm. Pt to return my call. Rhonda J Cobb

## 2013-06-07 NOTE — Telephone Encounter (Signed)
Pt's phone # is now - [phone]

## 2013-06-07 NOTE — Telephone Encounter (Signed)
CPAP Titration Study scheduled Tues 06/18/13 at 8:00 pm. Called patient back on new phone number and spoke with patient  Advised patient that Study was on Tues 06/19/14 not 06/20/13. Paperwork mailed to patient and he is aware of appointment date, time and location. Nothing else needed at this time. Rhonda J Cobb

## 2013-06-13 ENCOUNTER — Telehealth: Payer: Self-pay | Admitting: Pulmonary Disease

## 2013-06-13 NOTE — Telephone Encounter (Signed)
I called spoke with pt. Explained results to pt again. Advised him according to epic we did receive a letter from disability for his records and med recs took care of this. Nothing further needed

## 2013-06-18 ENCOUNTER — Telehealth: Payer: Self-pay | Admitting: Pulmonary Disease

## 2013-06-18 ENCOUNTER — Ambulatory Visit (HOSPITAL_BASED_OUTPATIENT_CLINIC_OR_DEPARTMENT_OTHER): Payer: Medicaid Other | Attending: Pulmonary Disease | Admitting: Radiology

## 2013-06-18 VITALS — Ht 73.5 in | Wt 184.0 lb

## 2013-06-18 DIAGNOSIS — G4733 Obstructive sleep apnea (adult) (pediatric): Secondary | ICD-10-CM | POA: Diagnosis present

## 2013-06-18 DIAGNOSIS — Z9989 Dependence on other enabling machines and devices: Secondary | ICD-10-CM

## 2013-06-18 DIAGNOSIS — I5020 Unspecified systolic (congestive) heart failure: Secondary | ICD-10-CM | POA: Diagnosis not present

## 2013-06-18 MED ORDER — ZOLPIDEM TARTRATE 10 MG PO TABS: 10.0000 mg | ORAL_TABLET | Freq: Every evening | ORAL | Status: DC | PRN

## 2013-06-18 NOTE — Telephone Encounter (Signed)
ambien 10 mg x1 ok

## 2013-06-18 NOTE — Telephone Encounter (Signed)
Called spoke with pt. He is scheduled for sleep study tonight. At his last sleep study pt reports he was not able to sleep at all. He wants something called in to help him sleep tonight. Please advise Dr. Elsworth Soho thanks  Allergies  Allergen Reactions  . Ace Inhibitors Swelling    angioedema

## 2013-06-18 NOTE — Telephone Encounter (Signed)
Ambien 10mg  #1 x 0RF phoned into [facility] Pt aware.

## 2013-06-26 DIAGNOSIS — G4730 Sleep apnea, unspecified: Secondary | ICD-10-CM

## 2013-06-26 DIAGNOSIS — G4710 Hypersomnia, unspecified: Secondary | ICD-10-CM

## 2013-06-28 ENCOUNTER — Telehealth: Payer: Self-pay | Admitting: Pulmonary Disease

## 2013-06-28 DIAGNOSIS — G4733 Obstructive sleep apnea (adult) (pediatric): Secondary | ICD-10-CM

## 2013-06-28 NOTE — Sleep Study (Signed)
[place]  NAME: Charles Cervantes  DATE OF BIRTH: 01/21/1960  MEDICAL RECORD NUMBER [id number]  LOCATION: [facility]  PHYSICIAN: Cosme Jacob V.  DATE OF STUDY: 06/28/13   SLEEP STUDY TYPE: CPAP titration study               REFERRING PHYSICIAN: Rigoberto Noel, MD  INDICATION FOR STUDY: 53 yo AAM with systolic heart failure and moderate OSA. Baseline polysomnogram showed AHI of 26 events per hour with RDI of 43 events per hour and lowest desaturation of 87% during REM sleep.hence CPAP titration study was scheduled  At the time of this study ,they weighed 184 pounds with a height of  6 ft 1 inches and the BMI of 24, neck size of 14.5 inches. Epworth sleepiness score was 15   This CPAP titration polysomnogram was performed with a sleep technologist in attendance. EEG, EOG,EMG and respiratory parameters recorded. Sleep stages, arousals, limb movements and respiratory data was scored according to criteria laid out by the American Academy of sleep medicine.  SLEEP ARCHITECTURE: Lights out was at 20-36 PM and lights on was at 449 AM. Total sleep time was 218 minutes with sleep period time of 367 minutes and sleep efficiency of 58% .Sleep latency was 2 minutes with latency to REM sleep of 42 minutes and wake after sleep onset of 1 53 minutes.  Sleep stages as a percentage of total sleep time was N1 -17%,N2- 58% and REM sleep 25% ( 54 minutes) . The longest period of REM sleep was around for AM.   AROUSAL DATA : There were 120 arousals with an arousal index of 33 events per hour. Of these 100 were spontaneous, and 19 were associated with respiratory events and 1 were associated periodic limb movements  RESPIRATORY DATA: CPAP was initiated at 5 centimeters and titrated to a final level of 14 centimeters due to respiratory events and snoring. At the final level of 14 centimeters, there were 0 obstructive apneas, 0 central apneas, 0 mixed apneas and 0 hypopneas  with apnea -hypopnea index of 0 events per hour.  There was no relation to sleep stage or body position. Titration was optimal.  MOVEMENT/PARASOMNIA: There were for PLMS with a PLM index of 1 events per hour. The PLM arousal index was 0.3 events per hour.  OXYGEN DATA: The lowest desaturation was 87% during REM sleep and the desaturation index was 11 per hour. The saturations stayed below 88% for 0 minutes.  CARDIAC DATA: The low heart rate was 43 beats per minute. The high heart rate recorded was an artifact. No arrhythmias were noted   IMPRESSION :  1. moderate obstructive sleep apnea with hypopneas causing sleep fragmentation and mild oxygen desaturation. 2. This was corrected by CPAP of 14 centimeters with a medium fullface mask. Titration was optimal. 3. No evidence of cardiac arrhythmias or behavioral disturbance during sleep. 4. Periodic limb movements were not noted.  RECOMMENDATION:    1. The treatment options for this degree of sleep disordered breathing includes weight loss and CPAP therapy. CPAP can be initiated at 14 centimeters with a medium fullface mask and compliance monitored at this level. 2. Patient should be cautioned against driving when sleepy 3. They should be asked to avoid medications with sedative side effects  Rigoberto Noel  MD Diplomate, American Board of Sleep Medicine  ELECTRONICALLY SIGNED ON:  06/28/2013  [place] PH: [phone]   FX: [phone] ACCREDITED  BY THE AMERICAN ACADEMY OF SLEEP MEDICINE

## 2013-06-28 NOTE — Telephone Encounter (Signed)
rx for CPAP 14 cm with medium fullface mask and download sent. Arranged office visit in one month

## 2013-06-28 NOTE — Telephone Encounter (Signed)
Dr. Elsworth Soho has already placed order.  Called pt LMTCB x1

## 2013-07-01 NOTE — Telephone Encounter (Signed)
lmomtcb x 2  

## 2013-07-02 NOTE — Telephone Encounter (Signed)
lmtcb x3 for pt on both #'s.

## 2013-07-03 NOTE — Telephone Encounter (Signed)
lmtcb at both #'s.

## 2013-07-03 NOTE — Telephone Encounter (Signed)
lmtcb x1 

## 2013-07-03 NOTE — Telephone Encounter (Signed)
Pt has returned call.  Please call back at [phone] after 10:00.

## 2013-07-04 NOTE — Telephone Encounter (Signed)
lmomtcb x 2  

## 2013-07-09 NOTE — Telephone Encounter (Signed)
I spoke with patient about results and he verbalized understanding and had no questions appt scheduled

## 2013-08-12 ENCOUNTER — Ambulatory Visit: Payer: Medicaid Other | Admitting: Pulmonary Disease

## 2013-08-12 ENCOUNTER — Encounter: Payer: Self-pay | Admitting: Pulmonary Disease

## 2013-08-12 VITALS — BP 118/78 | HR 74 | Temp 97.0°F | Ht 72.5 in | Wt 189.2 lb

## 2013-08-12 DIAGNOSIS — G4733 Obstructive sleep apnea (adult) (pediatric): Secondary | ICD-10-CM

## 2013-08-12 NOTE — Progress Notes (Signed)
   Subjective:    Patient ID: Charles Cervantes, male    DOB: 1960/08/31, 53 y.o.   MRN: [id number]  HPI   53 yo AAM with systolic heart failure and moderate OSA. Baseline polysomnogram showed AHI of 26 events per hour with RDI of 43 events per hour and lowest desaturation of 87% during REM sleep  He was diagnosed in 04/2012 in [place] with NICM (normal cors on cath), EF 20-25% -attributed to polysubstance abuse (cocaine, tobacco and alcohol). EF has now improved to 35%. He also has hypertension and is poorly compliant with medications.  He smoked as much as 1.5 packs per day and is now cut down to 7 cigarettes per day  Chest x-ray on 06/22/12 does not show infiltrates or effusions.  Spirometry showed a ratio of 72, FEV1 of 1.74-48% and FVC of 2.43-52% consistent with moderate restriction.  Chief Complaint  Patient presents with  . Follow-up    w/ CPAP download. Pt wore CPAP 27/30 nights. pt reports he is not able to sleep at all. the CPAP is causing dry mouth. Pt still feeling SOB w/ exertion, wheezing, chest tx, slight prod cough-yellow phlem.    PSG 04/2013 - ahi 26/h CPAP 14 cm with medium fullface mask  Continues to smoke 7 cigs/d C/o dryness, unable to use cpap more than 2h at a time Machine does help - no choking episodes C/o some insomnia Download on 14 cm shows no residuals, but usage about 2h,no leak  Review of Systems neg for any significant sore throat, dysphagia, itching, sneezing, nasal congestion or excess/ purulent secretions, fever, chills, sweats, unintended wt loss, pleuritic or exertional cp, hempoptysis, orthopnea pnd or change in chronic leg swelling. Also denies presyncope, palpitations, heartburn, abdominal pain, nausea, vomiting, diarrhea or change in bowel or urinary habits, dysuria,hematuria, rash, arthralgias, visual complaints, headache, numbness weakness or ataxia.     Objective:   Physical Exam  Gen. Pleasant, well-nourished, in no distress ENT - no lesions,  no post nasal drip Neck: No JVD, no thyromegaly, no carotid bruits Lungs: no use of accessory muscles, no dullness to percussion, clear without rales or rhonchi  Cardiovascular: Rhythm regular, heart sounds  normal, no murmurs or gallops, no peripheral edema Musculoskeletal: No deformities, no cyanosis or clubbing        Assessment & Plan:

## 2013-08-12 NOTE — Assessment & Plan Note (Signed)
Change to auto CPAP 10-14 cm Trial of nasal mask Trial of melatonin 5mg  2h before bedtime  Weight loss encouraged, compliance with goal of at least 4-6 hrs every night is the expectation. Advised against medications with sedative side effects Cautioned against driving when sleepy - understanding that sleepiness will vary on a day to day basis

## 2013-08-12 NOTE — Patient Instructions (Signed)
Lower pressure on your CPAP Trial of nasal mask Trial of melatonin 5mg  2h before bedtime

## 2013-08-16 ENCOUNTER — Encounter (HOSPITAL_COMMUNITY): Payer: Self-pay

## 2013-08-21 NOTE — Progress Notes (Signed)
Patient ID: Charles Cervantes, male   DOB: 1960/12/18, 53 y.o.   MRN: [id number]  Guide IT Study [facility]: [facility] Pulmonology: Dr Elsworth Soho  HPI: Charles Cervantes is a 53 yo AAM diagnosed with systolic heart failure in 04/2012 in [place] secondary to NICM (normal cors on cath), EF 20-25%. As well as polysubstance abuse (cocaine, tobacco and alcohol)--> quit in 04/2012.   He moved from [facility] and was admitted to [facility] on 05/01/12 with progressive dyspnea and orthopnea.  Echo on admit showed EF 20-25% with normal RV.  Discharge weight 147 pounds.  He also had NSVT while in house and we continued his Lifevest placed in [place].  Admitted To [facility] in 8/14 with HF. EF reported 50-55% on echo. On 10/2012 bedside echo in our HF Clinic EF 35-40%. In 11/2012 presented to clinic c/o recurrent CP and severe HF symptoms but seemed well compensated on exam R/L cath performed as below.  He returns for follow up.  Complains of fatigue from lack of sleep. Just saw Dr Elsworth Soho and CPAP was adjusted. Still not sleeping well. Weight at home Taking all medications but has difficulty paying for medications. He has not had alcohol or drugs in 1 year. Attends weekly drug counseling. Lives alone.   ECHO  04/2012 EF 20-25% 07/17/12 EF 30-35% 08/2012 EF 50-55% at [facility] ECHO 02/27/13 EF 35-40%  RHC/LHC  11/20/12  RA = 6  RV = 33/3/8  PA = 29/9 (18)  PCW = 8  Fick cardiac output/index = 6.4/3.2  PVR = 1.5 WU  FA sat = 95%  PA sat = 72%, 73%  SVC sat 72% Extensive calcification in proximal and mid LAD with only mild intraluminal stenosis with 20-30% stenosis.    CPX 2/15 FVC 2.90 (62%)  FEV1 2.22 (60%) FEV1/FVC 76%  Resting HR: 61 Peak HR: 111 (66% age predicted max HR) BP rest: 146/90 BP peak: 168/86 Peak VO2: 22.6 (64.1% predicted peak VO2) VE/VCO2 slope: 29.4 OUES: 2.49 Peak RER: 0.90  Labs 06/29/12 Potassium 5.4 Creatinine 1.05 07/17/12 Dig Level 0.5 Potassium 4.0 Creatinine 0.95 Pro BNP 244 11/20/12 K 4.0 Creatinine  1.18 12/18/12: K 4.7, Cr 0.96 05/16/13 K 4.5 Creatinine 0.91  ROS: All systems negative except as listed in HPI, PMH and Problem List.  Past Medical History  Diagnosis Date  . Hypertension   . Hyperlipidemia   . CHF (congestive heart failure)   . C. difficile diarrhea   . H/O blood clots 2012    in leg    Current Outpatient Prescriptions  Medication Sig Dispense Refill  . carvedilol (COREG) 6.25 MG tablet Take 1 tablet (6.25 mg total) by mouth 2 (two) times daily.  60 tablet  3  . furosemide (LASIX) 40 MG tablet Take 40 mg by mouth daily.      Marland Kitchen losartan (COZAAR) 100 MG tablet Take 1 tablet (100 mg total) by mouth daily.  30 tablet  3  . spironolactone (ALDACTONE) 25 MG tablet Take 1 tablet (25 mg total) by mouth daily.  30 tablet  3   No current facility-administered medications for this encounter.    Filed Vitals:   08/22/13 1238  BP: 110/60  Pulse: 53  Weight: 193 lb 12.8 oz (87.907 kg)  SpO2: 100%   PHYSICAL EXAM: General:  Well appearing. No resp difficulty HEENT: normal Neck: supple. JVP 5-6. Carotids 2+ bilaterally; no bruits. No lymphadenopathy or thryomegaly appreciated. Cor: PMI normal. Regular rate & rhythm. No rubs, or murmurs.  Lungs: clear  Abdomen: soft, nontender, nondistention. No hepatosplenomegaly. No bruits or masses. Good bowel sounds. Extremities: no cyanosis, clubbing, rash, edema Neuro: alert & orientedx3, cranial nerves grossly intact. Moves all 4 extremities w/o difficulty. Affect pleasant.   ASSESSMENT & PLAN:  1) Chronic systolic HF: NICM, ECHO 02/8784 EF ~35-40% - NYHA II symptoms.  Volume status stable. Continue lasix 40 mg daily . Continue carvedilol 6.25 mg twice a day will not up titrate due to low heart rate.   Continue losartan 100 mg daily.  Trying to get disability.  -- Reinforced the need and importance of daily weights, a low sodium diet, and fluid restriction (less than 2 L a day). Instructed to call the HF clinic if weight  increases more than 3 lbs overnight or 5 lbs in a week.   2) Polysubstance abuse - remains drug and ETOH free and attending [place] meetings and group sessions weekly. 3) Current smoker - continues to smoke about 1-2 cigarettes a day. Encouraged stop.   4) HTN - Stable.  Continue carvedilol 6.25 mg twice a day, spironolactone 25 mg daily, and losartan 100 mg a day.   5) OSA- Per DrAlva.    Follow up in 3 months.  CLEGG,AMY, NP-C 12:41 PM

## 2013-08-22 ENCOUNTER — Ambulatory Visit (HOSPITAL_COMMUNITY)
Admission: RE | Admit: 2013-08-22 | Discharge: 2013-08-22 | Disposition: A | Discharge status: Home / Self Care | Payer: Medicaid Other | Source: Ambulatory Visit | Attending: Internal Medicine | Admitting: Internal Medicine

## 2013-08-22 VITALS — BP 110/60 | HR 53 | Wt 193.8 lb

## 2013-08-22 DIAGNOSIS — F1011 Alcohol abuse, in remission: Secondary | ICD-10-CM | POA: Diagnosis not present

## 2013-08-22 DIAGNOSIS — I10 Essential (primary) hypertension: Secondary | ICD-10-CM

## 2013-08-22 DIAGNOSIS — F17200 Nicotine dependence, unspecified, uncomplicated: Secondary | ICD-10-CM | POA: Diagnosis not present

## 2013-08-22 DIAGNOSIS — I428 Other cardiomyopathies: Secondary | ICD-10-CM | POA: Diagnosis not present

## 2013-08-22 DIAGNOSIS — G4733 Obstructive sleep apnea (adult) (pediatric): Secondary | ICD-10-CM | POA: Insufficient documentation

## 2013-08-22 DIAGNOSIS — I509 Heart failure, unspecified: Secondary | ICD-10-CM | POA: Insufficient documentation

## 2013-08-22 DIAGNOSIS — E785 Hyperlipidemia, unspecified: Secondary | ICD-10-CM | POA: Insufficient documentation

## 2013-08-22 DIAGNOSIS — I5022 Chronic systolic (congestive) heart failure: Secondary | ICD-10-CM | POA: Diagnosis not present

## 2013-08-22 DIAGNOSIS — F1411 Cocaine abuse, in remission: Secondary | ICD-10-CM | POA: Insufficient documentation

## 2013-08-22 NOTE — Patient Instructions (Signed)
Follow up in 3 months   Do the following things EVERYDAY: 1) Weigh yourself in the morning before breakfast. Write it down and keep it in a log. 2) Take your medicines as prescribed 3) Eat low salt foods-Limit salt (sodium) to 2000 mg per day.  4) Stay as active as you can everyday 5) Limit all fluids for the day to less than 2 liters  

## 2013-09-04 ENCOUNTER — Encounter: Payer: Self-pay | Admitting: Internal Medicine

## 2013-09-12 ENCOUNTER — Ambulatory Visit (INDEPENDENT_AMBULATORY_CARE_PROVIDER_SITE_OTHER): Payer: Self-pay | Admitting: Family Medicine

## 2013-09-12 ENCOUNTER — Encounter: Payer: Self-pay | Admitting: Family Medicine

## 2013-09-12 VITALS — BP 130/84 | HR 75 | Temp 98.4°F | Ht 73.5 in | Wt 190.0 lb

## 2013-09-12 DIAGNOSIS — F17200 Nicotine dependence, unspecified, uncomplicated: Secondary | ICD-10-CM

## 2013-09-12 DIAGNOSIS — IMO0002 Reserved for concepts with insufficient information to code with codable children: Secondary | ICD-10-CM

## 2013-09-12 DIAGNOSIS — M792 Neuralgia and neuritis, unspecified: Secondary | ICD-10-CM

## 2013-09-12 DIAGNOSIS — Z0000 Encounter for general adult medical examination without abnormal findings: Secondary | ICD-10-CM

## 2013-09-12 MED ORDER — GABAPENTIN 100 MG PO CAPS: 100.0000 mg | ORAL_CAPSULE | Freq: Three times a day (TID) | ORAL | Status: DC

## 2013-09-12 NOTE — Patient Instructions (Signed)
Thank you for coming to the clinic today. It was nice seeing you.  For your leg pain you likely have a pinched or stretched nerve. This is also causing the numbness. At this point we will not image your back. We will start a medication called gabapentin. This is a good medication for your type of pain. Take 1 tablet 3 times a day and give it 2-3 weeks. If not getting better call the office and we can adjust your dose.   We would also like to get your records. We also discussed colon cancer screening for either the the colonoscopy or the take home cards. We will give a list a offices that will do this for you.  See you again in 3-6 months. Or sooner if you need anything.

## 2013-09-12 NOTE — Assessment & Plan Note (Signed)
Gave patient handout for options for colonoscopy in the area. Also will obtain records from previous PCP to see if prior A1C or lipid panel has been done. Not interested in flu shot today. If screening labs have not been obtained, or we are unable to obtain his records, we will likely draw them at his next visit.

## 2013-09-12 NOTE — Assessment & Plan Note (Signed)
Positive straight leg raise in left leg, likely lumbar radiculopathy. Given his stable presentation and lack of red flag symptoms, we will hold off on imaging at this time as it will not likely change management. Patient was instructed to call immediately if he experienced any red flags. At this time, we will start with a low dose of gabapentin (100mg  tid) and uptitrate as needed.

## 2013-09-12 NOTE — Progress Notes (Signed)
Patient ID: Charles Cervantes, male   DOB: [birth year], 53 y.o.   MRN: [id number]    Subjective:  Patient presents today to establish care. Chief complain of left leg pain today. His concerns today include:  HPI  Left Leg Pain. Started 2-3 years ago, hit his hip on edge of table. Pain has been persistent and worsening since then. Pain from left lower back into foot. Described as sharp, burning, and shooting. Also with associated numbness. Has tried OTC ibuprofen and tylenol without much relief. Has not tried any prescription medications. Constant pain. 9/10 at worst. Aggravated by exercise. Does not know any alleviating factors. Has never had imaging. No bowel or bladder incontinence. No saddle anesthesia.   Healthcare maintenance: Last colonoscopy in 2004. Not sure when last A1C or lipid panel was drawn. Smokes 5-6 cigarettes per day, trying to cut down. No alcohol or drug use.  ROS: As per HPI, otherwise all systems reviewed and are negative.  PMH The following were reviewed and entered/updated in epic: Past Medical History  Diagnosis Date  . Hypertension   . Hyperlipidemia   . CHF (congestive heart failure)   . C. difficile diarrhea   . H/O blood clots 2012    in leg   Patient Active Problem List   Diagnosis Date Noted  . Neuropathic pain 09/12/2013  . Healthcare maintenance 09/12/2013  . OSA (obstructive sleep apnea) 03/26/2013  . HTN (hypertension) 01/01/2013  . Chest pain 11/16/2012  . Current smoker 09/05/2012  . Recurrent colitis due to Clostridium difficile 08/01/2012  . Chronic systolic heart failure 96/28/3662  . Polysubstance abuse 05/08/2012  . Alcohol abuse 05/01/2012  . Cocaine abuse 05/01/2012  . Acute systolic congestive heart failure, NYHA class 4 05/01/2012   Past Surgical History  Procedure Laterality Date  . Left second finger surgery      No significant family history. Patient's parents died when he was young.   Medications- reviewed and updated Current  Outpatient Prescriptions  Medication Sig Dispense Refill  . carvedilol (COREG) 6.25 MG tablet Take 1 tablet (6.25 mg total) by mouth 2 (two) times daily.  60 tablet  3  . furosemide (LASIX) 40 MG tablet Take 40 mg by mouth daily.      Marland Kitchen losartan (COZAAR) 100 MG tablet Take 1 tablet (100 mg total) by mouth daily.  30 tablet  3  . spironolactone (ALDACTONE) 25 MG tablet Take 1 tablet (25 mg total) by mouth daily.  30 tablet  3  . gabapentin (NEURONTIN) 100 MG capsule Take 1 capsule (100 mg total) by mouth 3 (three) times daily.  90 capsule  3   No current facility-administered medications for this visit.    Allergies-reviewed and updated Allergies  Allergen Reactions  . Ace Inhibitors Swelling    angioedema    History   Social History  . Marital Status: Single    Spouse Name: N/A    Number of Children: 0  . Years of Education: N/A   Occupational History  . unemployed    Social History Main Topics  . Smoking status: Current Some Day Smoker -- 0.20 packs/day for 25 years    Types: Cigarettes  . Smokeless tobacco: Never Used     Comment: 5-6 cigs per day  . Alcohol Use: No     Comment: stopped in april 2014  . Drug Use: No  . Sexual Activity: No   Other Topics Concern  . None   Social History Narrative  . None  Objective: Physical Exam: BP 130/84  Pulse 75  Temp(Src) 98.4 F (36.9 C) (Oral)  Ht 6' 1.5" (1.867 m)  Wt 190 lb (86.183 kg)  BMI 24.72 kg/m2  SpO2 97%  Gen: NAD, resting comfortably in chair CV: RRR with no murmurs appreciated Lungs: NWOB, CTAB with no crackles, wheezes, or rhonchi Abdomen: Normal bowel sounds present. Soft, Nontender, Nondistended. Ext: no edema or cyanosis Skin: warm, dry Back: Tender to palpation along lumbar spine. No step offs or deformities. No masses. Neuro: Strength 5/5 in bilateral plantar and dorsiflexion, knee extension and flexion, and hip flexion and extension. Increased sensation along lateral aspect of left lower  extremity. Patellar reflexes 1+ bilaterally. Positive straight leg raise on left.   No results found for this or any previous visit (from the past 72 hour(s)).  A/P: See problem list  Current smoker Smokes 5-6 cigarettes per day. Counseled on smoking cessation and encouraged patient to continue weaning off.   Healthcare maintenance Gave patient handout for options for colonoscopy in the area. Also will obtain records from previous PCP to see if prior A1C or lipid panel has been done. Not interested in flu shot today. If screening labs have not been obtained, or we are unable to obtain his records, we will likely draw them at his next visit.   Neuropathic pain Positive straight leg raise in left leg, likely lumbar radiculopathy. Given his stable presentation and lack of red flag symptoms, we will hold off on imaging at this time as it will not likely change management. Patient was instructed to call immediately if he experienced any red flags. At this time, we will start with a low dose of gabapentin (100mg  tid) and uptitrate as needed.     No orders of the defined types were placed in this encounter.    Meds ordered this encounter  Medications  . gabapentin (NEURONTIN) 100 MG capsule    Sig: Take 1 capsule (100 mg total) by mouth 3 (three) times daily.    Dispense:  90 capsule    Refill:  [place]. Jerline Pain, [place] Resident PGY-1 09/12/2013 4:41 PM

## 2013-09-12 NOTE — Assessment & Plan Note (Signed)
Smokes 5-6 cigarettes per day. Counseled on smoking cessation and encouraged patient to continue weaning off.

## 2013-09-26 ENCOUNTER — Telehealth: Payer: Self-pay | Admitting: Family Medicine

## 2013-09-26 NOTE — Telephone Encounter (Signed)
LMOVM for pt to return call .Sanyia Dini Dawn  

## 2013-09-26 NOTE — Telephone Encounter (Signed)
Patient can increase his dosage of gabapentin.   He can take 100mg  in the morning, 100mg  during the day, and 300mg  at night for 3 days. Then 300mg  in the morning and 300mg  at night for 3 days. Then 300mg  tid and see how his pain responds for 2 weeks.  If still not responding, can continue to up-titrate.   Algis Greenhouse. Jerline Pain, [place] Resident PGY-1 09/26/2013 2:04 PM

## 2013-09-26 NOTE — Telephone Encounter (Signed)
Pt states his gabapentin is not working Still in a lot of pain Please advise

## 2013-10-10 ENCOUNTER — Other Ambulatory Visit (HOSPITAL_COMMUNITY): Payer: Self-pay | Admitting: Adult Health

## 2013-10-14 ENCOUNTER — Encounter: Payer: Self-pay | Admitting: Licensed Clinical Social Worker

## 2013-10-14 NOTE — Progress Notes (Signed)
CSW met with patient in the clinic. Patient reports continued frustration with length of time of disability application through Social security. Patient reports he needed paperwork completed by PCP and was able to get done and return to SS. Patient states that he has [employer] working on his disability application and hopeful for a determination soon. Patient verbalizes understanding and appreciative of supportive listening. CSW provided supportive intervention and will continue to be available as needed. Jackie , LCSW [phone] 

## 2013-10-15 ENCOUNTER — Encounter: Payer: Self-pay | Admitting: Family Medicine

## 2013-10-15 NOTE — Progress Notes (Signed)
Pt drops off a form from [employer]. Needs clearance to give plasma due to him having HTN. Please call patient for pick up. [phone]

## 2013-10-15 NOTE — Progress Notes (Signed)
Patient ID: Charles Cervantes, male   DOB: 03-26-1960, 53 y.o.   MRN: [id number]

## 2013-10-17 ENCOUNTER — Telehealth: Payer: Self-pay | Admitting: Family Medicine

## 2013-10-17 NOTE — Telephone Encounter (Signed)
Will forward to MD to check on status of forms. Mireya Meditz,CMA

## 2013-10-17 NOTE — Telephone Encounter (Signed)
Pt called to check the status of the forms he dropped of for the doctor to fill out. Please call and inform patient of the status. jw

## 2013-10-17 NOTE — Telephone Encounter (Signed)
I have been working nights over at [facility] and haven't had a chance to come by the office and look at the forms yet. I will come by tomorrow and hopefully the forms will be done by the afternoon.  Algis Greenhouse. Jerline Pain, [place] Resident PGY-1 10/17/2013 3:26 PM

## 2013-10-18 NOTE — Telephone Encounter (Signed)
Left voice message for pt informing the form is complete and ready for pick up. Derl Barrow, RN

## 2013-10-18 NOTE — Progress Notes (Signed)
Patient ID: Charles Cervantes, male   DOB: [birth year], 53 y.o.   MRN: [id number]  Form filled out and placed in Tamika's box.  Algis Greenhouse. Jerline Pain, [place] Resident PGY-1 10/18/2013 12:36 PM

## 2013-10-24 ENCOUNTER — Encounter (HOSPITAL_COMMUNITY): Payer: Self-pay | Admitting: Emergency Medicine

## 2013-10-24 ENCOUNTER — Emergency Department (HOSPITAL_COMMUNITY)
Admission: EM | Admit: 2013-10-24 | Discharge: 2013-10-24 | Disposition: A | Discharge status: Home / Self Care | Payer: Medicaid Other | Attending: Emergency Medicine | Admitting: Emergency Medicine

## 2013-10-24 DIAGNOSIS — R600 Localized edema: Secondary | ICD-10-CM | POA: Insufficient documentation

## 2013-10-24 DIAGNOSIS — I509 Heart failure, unspecified: Secondary | ICD-10-CM | POA: Insufficient documentation

## 2013-10-24 DIAGNOSIS — K088 Other specified disorders of teeth and supporting structures: Secondary | ICD-10-CM | POA: Diagnosis present

## 2013-10-24 DIAGNOSIS — I10 Essential (primary) hypertension: Secondary | ICD-10-CM | POA: Diagnosis not present

## 2013-10-24 DIAGNOSIS — Z8619 Personal history of other infectious and parasitic diseases: Secondary | ICD-10-CM | POA: Diagnosis not present

## 2013-10-24 DIAGNOSIS — G479 Sleep disorder, unspecified: Secondary | ICD-10-CM | POA: Insufficient documentation

## 2013-10-24 DIAGNOSIS — Z8639 Personal history of other endocrine, nutritional and metabolic disease: Secondary | ICD-10-CM | POA: Insufficient documentation

## 2013-10-24 DIAGNOSIS — Z720 Tobacco use: Secondary | ICD-10-CM | POA: Diagnosis not present

## 2013-10-24 DIAGNOSIS — K047 Periapical abscess without sinus: Secondary | ICD-10-CM

## 2013-10-24 DIAGNOSIS — Z79899 Other long term (current) drug therapy: Secondary | ICD-10-CM | POA: Diagnosis not present

## 2013-10-24 DIAGNOSIS — Z862 Personal history of diseases of the blood and blood-forming organs and certain disorders involving the immune mechanism: Secondary | ICD-10-CM | POA: Insufficient documentation

## 2013-10-24 MED ORDER — PENICILLIN V POTASSIUM 250 MG PO TABS
250.0000 mg | ORAL_TABLET | Freq: Once | ORAL | Status: AC
  Administered 2013-10-24: 250 mg via ORAL
  Filled 2013-10-24: qty 1

## 2013-10-24 MED ORDER — HYDROCODONE-ACETAMINOPHEN 5-325 MG PO TABS
2.0000 | ORAL_TABLET | Freq: Once | ORAL | Status: AC
  Administered 2013-10-24: 2 via ORAL
  Filled 2013-10-24: qty 2

## 2013-10-24 MED ORDER — PENICILLIN V POTASSIUM 500 MG PO TABS: 500.0000 mg | ORAL_TABLET | Freq: Three times a day (TID) | ORAL | Status: DC

## 2013-10-24 NOTE — ED Notes (Addendum)
Pt states swelling and pain to L upper jaw x 2 days.  Obvious swelling noted.

## 2013-10-24 NOTE — ED Provider Notes (Signed)
CSN: [id number]     Arrival date & time 10/24/13  1146 History  This chart was scribed for non-physician practitioner, Montine Circle, PA-C, working with Debby Freiberg, MD by Ladene Artist, ED Scribe. This patient was seen in room TR11C/TR11C and the patient's care was started at 1:07 PM.   Chief Complaint  Patient presents with  . Dental Pain   The history is provided by the patient. No language interpreter was used.   HPI Comments: Charles Cervantes is a 53 y.o. male who presents to the Emergency Department with a chief complaint of constant L upper dental pain onset 2 days ago. Pt reports associated dental swelling and L-sided facial swelling. He has tried flossing without relief.  He denies fevers or chills.  Aggravated with chewing.   Past Medical History  Diagnosis Date  . Hypertension   . Hyperlipidemia   . CHF (congestive heart failure)   . C. difficile diarrhea   . H/O blood clots 2012    in leg   Past Surgical History  Procedure Laterality Date  . Left second finger surgery     No family history on file. History  Substance Use Topics  . Smoking status: Current Some Day Smoker -- 0.20 packs/day for 25 years    Types: Cigarettes  . Smokeless tobacco: Never Used     Comment: 5-6 cigs per day  . Alcohol Use: No     Comment: stopped in april 2014    Review of Systems  Constitutional: Negative for fever and chills.  HENT: Positive for dental problem and facial swelling. Negative for drooling.   Neurological: Negative for speech difficulty.  Psychiatric/Behavioral: Positive for sleep disturbance.  All other systems reviewed and are negative.  Allergies  Ace inhibitors  Home Medications   Prior to Admission medications   Medication Sig Start Date End Date Taking? Authorizing Provider  carvedilol (COREG) 6.25 MG tablet Take 1 tablet (6.25 mg total) by mouth 2 (two) times daily. 04/22/13  Yes Amy D Clegg, NP  furosemide (LASIX) 40 MG tablet Take 40 mg by mouth daily.  11/16/12  Yes Jolaine Artist, MD  gabapentin (NEURONTIN) 100 MG capsule Take 1 capsule (100 mg total) by mouth 3 (three) times daily. 09/12/13  Yes Dimas Chyle, MD  losartan (COZAAR) 100 MG tablet Take 1 tablet (100 mg total) by mouth daily. 04/26/13  Yes Amy D Clegg, NP  spironolactone (ALDACTONE) 25 MG tablet Take 25 mg by mouth daily.   Yes Historical Provider, MD   Triage Vitals: BP 152/98  Pulse 58  Temp(Src) 98.5 F (36.9 C) (Oral)  Resp 16  Ht 6\' 1"  (1.854 m)  Wt 190 lb (86.183 kg)  BMI 25.07 kg/m2  SpO2 99% Physical Exam  Nursing note and vitals reviewed. Constitutional: He is oriented to person, place, and time. He appears well-developed and well-nourished. No distress.  HENT:  Head: Normocephalic and atraumatic.  Mouth/Throat:    Poor dentition throughout.  Affected tooth as diagrammed.  No signs of peritonsillar or tonsillar abscess.  No signs of gingival abscess. Oropharynx is clear and without exudates.  Uvula is midline.  Airway is intact. No signs of Ludwig's angina with palpation of oral and sublingual mucosa.   Eyes: Conjunctivae and EOM are normal.  Neck: Normal range of motion. Neck supple.  Cardiovascular: Normal rate.   Pulmonary/Chest: Effort normal. No respiratory distress.  Abdominal: He exhibits no distension.  Musculoskeletal: Normal range of motion.  Neurological: He is alert and oriented  to person, place, and time.  Skin: Skin is warm and dry.  Psychiatric: He has a normal mood and affect. His behavior is normal. Judgment and thought content normal.   ED Course  Procedures (including critical care time) DIAGNOSTIC STUDIES: Oxygen Saturation is 99% on RA, normal by my interpretation.    COORDINATION OF CARE: 1:11 PM-Discussed treatment plan which includes antibiotics, follow-up with dentist and return precautions and with pt at bedside and pt agreed to plan.   Labs Review Labs Reviewed - No data to display  Imaging Review No results  found.   EKG Interpretation None      MDM   Final diagnoses:  Dental abscess    Patient with toothache.  No gross abscess.  Exam unconcerning for Ludwig's angina or spread of infection.  Will treat with penicillin and pain medicine.  Urged patient to follow-up with dentist.    I personally performed the services described in this documentation, which was scribed in my presence. The recorded information has been reviewed and is accurate.     Montine Circle, PA-C 10/24/13 1315

## 2013-10-24 NOTE — Discharge Instructions (Signed)

## 2013-10-28 NOTE — ED Provider Notes (Signed)
Medical screening examination/treatment/procedure(s) were performed by non-physician practitioner and as supervising physician I was immediately available for consultation/collaboration.   EKG Interpretation None        Debby Freiberg, MD 10/28/13 1753

## 2013-11-04 ENCOUNTER — Ambulatory Visit (INDEPENDENT_AMBULATORY_CARE_PROVIDER_SITE_OTHER): Payer: Medicaid Other | Admitting: Family Medicine

## 2013-11-04 ENCOUNTER — Encounter: Payer: Self-pay | Admitting: Family Medicine

## 2013-11-04 VITALS — BP 130/97 | HR 73 | Temp 98.0°F | Wt 193.0 lb

## 2013-11-04 DIAGNOSIS — F329 Major depressive disorder, single episode, unspecified: Secondary | ICD-10-CM

## 2013-11-04 DIAGNOSIS — F4310 Post-traumatic stress disorder, unspecified: Secondary | ICD-10-CM | POA: Insufficient documentation

## 2013-11-04 DIAGNOSIS — F418 Other specified anxiety disorders: Secondary | ICD-10-CM

## 2013-11-04 DIAGNOSIS — M792 Neuralgia and neuritis, unspecified: Secondary | ICD-10-CM

## 2013-11-04 DIAGNOSIS — F419 Anxiety disorder, unspecified: Secondary | ICD-10-CM

## 2013-11-04 MED ORDER — VENLAFAXINE HCL ER 75 MG PO CP24: ORAL_CAPSULE | ORAL | Status: DC

## 2013-11-04 MED ORDER — GABAPENTIN 100 MG PO CAPS: 600.0000 mg | ORAL_CAPSULE | Freq: Three times a day (TID) | ORAL | Status: DC

## 2013-11-04 NOTE — Progress Notes (Signed)
Charles Cervantes is a 53 y.o. male who presents to the [facility] today with a chief complaint of neuropathic pain. His concerns today include:  HPI:  Neuropathic pain: Worsening. Reports good compliance to gabapentin 600mg  tid, says it is not helping. Pain is now worse. Mainly located in lower back radiating to left leg. Also with new right neck and arm pain for the past 2 weeks that is described as burning and electrical. Has not noticed anything that helped or aggravated the pain. Denies bowel or bladder incontinence. No weakness. No fevers or chills.  PTSD/Stress/Anxeity/Depression: Patient is former Interior and spatial designer. Reports frequent flashbacks and increased stress and anxiety. GAD-7 score of 19 with symptoms making live very difficult. PHQ-9 with score of 19 with symptoms making life very difficult. No SI or HI.  ROS: As per HPI, otherwise all systems reviewed and are negative.  Past Medical History - Reviewed and updated Patient Active Problem List   Diagnosis Date Noted  . Anxiety and depression 11/04/2013  . PTSD (post-traumatic stress disorder) 11/04/2013  . Neuropathic pain 09/12/2013  . Healthcare maintenance 09/12/2013  . OSA (obstructive sleep apnea) 03/26/2013  . HTN (hypertension) 01/01/2013  . Chest pain 11/16/2012  . Current smoker 09/05/2012  . Recurrent colitis due to Clostridium difficile 08/01/2012  . Chronic systolic heart failure 35/70/1779  . Polysubstance abuse 05/08/2012  . Alcohol abuse 05/01/2012  . Cocaine abuse 05/01/2012  . Acute systolic congestive heart failure, NYHA class 4 05/01/2012    Medications- reviewed and updated Current Outpatient Prescriptions  Medication Sig Dispense Refill  . carvedilol (COREG) 6.25 MG tablet Take 1 tablet (6.25 mg total) by mouth 2 (two) times daily. 60 tablet 3  . furosemide (LASIX) 40 MG tablet Take 40 mg by mouth daily.    Marland Kitchen gabapentin (NEURONTIN) 100 MG capsule Take 6 capsules (600 mg total) by mouth 3 (three) times  daily. 90 capsule 3  . losartan (COZAAR) 100 MG tablet Take 1 tablet (100 mg total) by mouth daily. 30 tablet 3  . penicillin v potassium (VEETID) 500 MG tablet Take 1 tablet (500 mg total) by mouth 3 (three) times daily. 30 tablet 0  . spironolactone (ALDACTONE) 25 MG tablet Take 25 mg by mouth daily.    Marland Kitchen venlafaxine XR (EFFEXOR XR) 75 MG 24 hr capsule Take 1 pill by mouth daily for 1 week, then increase to 2 pills by mouth daily and maintain for 4 weeks. 30 capsule 2   No current facility-administered medications for this visit.    Objective: Physical Exam: BP 130/97 mmHg  Pulse 73  Temp(Src) 98 F (36.7 C) (Oral)  Wt 193 lb (87.544 kg)  Gen: NAD, resting comfortably CV: RRR with no murmurs appreciated Lungs: NWOB, CTAB with no crackles, wheezes, or rhonchi Abdomen: Normal bowel sounds present. Soft, Nontender, Nondistended. Ext: no edema Skin: warm, dry MSK: Tenderness to palpation along C-spine and L-Spine with no step-offs or deformities.  Neuro: Strength 5/5 in bilateral lower and upper extremities. Sensation to gross touch intact bilaterally. CN2-12 intact.  No results found for this or any previous visit (from the past 72 hour(s)).  A/P: See problem list  Neuropathic pain Not improving with Gabapentin 600mg  tid. Will start Venlafaxine 75mg  daily today and uptitrate as needed. Hopefully this will additionally alleviate his psychiatric complaints as well. Will avoid tramadol and opioids given the patient's substance abuse history. May potentially refer to physical therapy for TENS unit placement if not improving. Referral made to pain clinic  per patient request.   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 Has frequent flashbacks and hypervigilance. Will start venlafaxine today. Additionally gave patient card for Dr Gwenlyn Saran, as patient would likely benefit from therapy.     Orders Placed This Encounter  Procedures  . Ambulatory referral to Pain Clinic      Referral Priority:  Routine    Referral Type:  Consultation    Referral Reason:  Specialty Services Required    Requested Specialty:  Pain Medicine    Number of Visits Requested:  1    Meds ordered this encounter  Medications  . gabapentin (NEURONTIN) 100 MG capsule    Sig: Take 6 capsules (600 mg total) by mouth 3 (three) times daily.    Dispense:  90 capsule    Refill:  3  . venlafaxine XR (EFFEXOR XR) 75 MG 24 hr capsule    Sig: Take 1 pill by mouth daily for 1 week, then increase to 2 pills by mouth daily and maintain for 4 weeks.    Dispense:  30 capsule    Refill:  2     Caleb M. Jerline Pain, [place] Resident PGY-1 11/04/2013 3:07 PM

## 2013-11-04 NOTE — Patient Instructions (Signed)
Thank you for coming to the clinic today. It was nice seeing you.  For your pain, we have started a new medication called venlafaxine. This will also help with your PTSD and anxiety symptoms. Take 1 pill by mouth daily for 1 week, then increase to 2 pills by mouth for 1 week. I have also made a referral to the pain the clinic. Sometimes this can take several months to get in, but we will do our best to manage your pain in the meantime.  See you again in 4 weeks.

## 2013-11-04 NOTE — Addendum Note (Signed)
Addended by: Vivi Barrack on: 11/04/2013 03:36 PM   Modules accepted: Orders

## 2013-11-04 NOTE — Assessment & Plan Note (Signed)
Not improving with Gabapentin 600mg  tid. Will start Venlafaxine 75mg  daily today and uptitrate as needed. Hopefully this will additionally alleviate his psychiatric complaints as well. Will avoid tramadol and opioids given the patient's substance abuse history. May potentially refer to physical therapy for TENS unit placement if not improving. Referral made to pain clinic per patient request.

## 2013-11-04 NOTE — Assessment & Plan Note (Signed)
Patient is former Network engineer. Has frequent flashbacks and hypervigilance. Will start venlafaxine today. Additionally gave patient card for Dr Gwenlyn Saran, as patient would likely benefit from therapy.

## 2013-11-05 ENCOUNTER — Telehealth: Payer: Self-pay | Admitting: *Deleted

## 2013-11-05 ENCOUNTER — Telehealth: Payer: Self-pay | Admitting: Psychology

## 2013-11-05 NOTE — Telephone Encounter (Signed)
Received phone call from [facility] @ pharmacy for verification on Gabapentin.  Pts last rx was only a total 300mg s daily, wanted to double check order.   MD paged and he verified that he did indeed mean to change to 1800mg  daily (see phone note from 09/26/2013) but wanted me to verify with patient what dosage he was taking.   LMOVM for pt to return call to verify dosage.   Attempted to inform Yaw, but I was unable to get thru after 17+ mins on hold.  LM on refill line at [place] that we were waiting to hear back from patient. Griff Badley, Salome Spotted

## 2013-11-05 NOTE — Telephone Encounter (Signed)
Charles Cervantes called to request a therapy appointment for treatment of PTSD.  Discussed options.  Referred him to the [facility].  They take Medicaid, our close by the [facility], and have therapists who specialize in trauma.  Phone number is:  [phone].  Asked him to call back if he has questions or concerns.

## 2013-11-27 ENCOUNTER — Encounter (HOSPITAL_COMMUNITY): Payer: Self-pay

## 2013-12-11 ENCOUNTER — Encounter: Payer: Self-pay | Admitting: Physical Medicine & Rehabilitation

## 2013-12-12 ENCOUNTER — Encounter (HOSPITAL_COMMUNITY): Payer: Self-pay | Admitting: Internal Medicine

## 2013-12-19 ENCOUNTER — Telehealth (HOSPITAL_COMMUNITY): Payer: Self-pay | Admitting: Vascular Surgery

## 2013-12-19 NOTE — Telephone Encounter (Signed)
Pt has questions about his medications... He feels like he is taking the wrong medication.Charles Cervantes He would like someone to call him to go over his medications... He also states he has been having nose bleeds and pain in his arm.. Please advise

## 2013-12-19 NOTE — Telephone Encounter (Signed)
Attempted to return cal to patienit's listed phone numbers, no answer.  Left VM to return our call to address concerns with what medications he should be taking and also about his c/o nose bleed.  In looking through patient's chart, has not been seen by our office since August and cancelled his 3 month f/u appointment last month, so would also like to set this up when/if he returns our call.  Will attempt to call back again.  Renee Pain

## 2013-12-31 ENCOUNTER — Ambulatory Visit (HOSPITAL_COMMUNITY)
Admission: RE | Admit: 2013-12-31 | Discharge: 2013-12-31 | Disposition: A | Discharge status: Home / Self Care | Payer: Medicaid Other | Source: Ambulatory Visit | Attending: Internal Medicine | Admitting: Internal Medicine

## 2013-12-31 ENCOUNTER — Encounter (HOSPITAL_COMMUNITY): Payer: Self-pay

## 2013-12-31 VITALS — BP 144/90 | HR 63 | Wt 191.1 lb

## 2013-12-31 DIAGNOSIS — F17200 Nicotine dependence, unspecified, uncomplicated: Secondary | ICD-10-CM

## 2013-12-31 DIAGNOSIS — I10 Essential (primary) hypertension: Secondary | ICD-10-CM

## 2013-12-31 DIAGNOSIS — Z720 Tobacco use: Secondary | ICD-10-CM

## 2013-12-31 DIAGNOSIS — Z79899 Other long term (current) drug therapy: Secondary | ICD-10-CM | POA: Insufficient documentation

## 2013-12-31 DIAGNOSIS — I429 Cardiomyopathy, unspecified: Secondary | ICD-10-CM | POA: Diagnosis not present

## 2013-12-31 DIAGNOSIS — Z87898 Personal history of other specified conditions: Secondary | ICD-10-CM | POA: Diagnosis not present

## 2013-12-31 DIAGNOSIS — I5022 Chronic systolic (congestive) heart failure: Secondary | ICD-10-CM | POA: Diagnosis present

## 2013-12-31 DIAGNOSIS — E785 Hyperlipidemia, unspecified: Secondary | ICD-10-CM | POA: Diagnosis not present

## 2013-12-31 DIAGNOSIS — G4733 Obstructive sleep apnea (adult) (pediatric): Secondary | ICD-10-CM | POA: Diagnosis not present

## 2013-12-31 DIAGNOSIS — F1010 Alcohol abuse, uncomplicated: Secondary | ICD-10-CM

## 2013-12-31 NOTE — Progress Notes (Signed)
Patient ID: Charles Cervantes, male   DOB: Jun 29, 1960, 53 y.o.   MRN: [id number]  [employer] [facility] PCP: [facility]. Pulmonology: Dr Elsworth Soho  HPI: Charles Cervantes is a 53 yo AAM diagnosed with systolic heart failure in 04/2012 in [place] secondary to NICM (normal cors on cath), EF 20-25%. As well as polysubstance abuse (cocaine, tobacco and alcohol)--> quit in 04/2012.   He moved from [facility] and was admitted to [facility] on 05/01/12 with progressive dyspnea and orthopnea.  Echo on admit showed EF 20-25% with normal RV.  Discharge weight 147 pounds.  He also had NSVT while in house and we continued his Lifevest placed in [place].  Admitted To [facility] in 8/14 with HF. EF reported 50-55% on echo. On 10/2012 bedside echo in our HF Clinic EF 35-40%. In 11/2012 presented to clinic c/o recurrent CP and severe HF symptoms but seemed well compensated on exam R/L cath performed as below.  He returns for follow up. Complains of anxiety due to lack of income. Disability pending. Denies SOB/PND/Orthopnea. Complains of fatigue.  Not weighing daily. Has difficulty paying for medications. Has been spiro for 1 month.  He has not had alcohol or drugs in 1 year. Attends weekly drug counseling. Lives alone.   ECHO  04/2012 EF 20-25% 07/17/12 EF 30-35% 08/2012 EF 50-55% at [facility] ECHO 02/27/13 EF 35-40%  RHC/LHC  11/20/12  RA = 6  RV = 33/3/8  PA = 29/9 (18)  PCW = 8  Fick cardiac output/index = 6.4/3.2  PVR = 1.5 WU  FA sat = 95%  PA sat = 72%, 73%  SVC sat 72% Extensive calcification in proximal and mid LAD with only mild intraluminal stenosis with 20-30% stenosis.    CPX 2/15 FVC 2.90 (62%)  FEV1 2.22 (60%) FEV1/FVC 76%  Resting HR: 61 Peak HR: 111 (66% age predicted max HR) BP rest: 146/90 BP peak: 168/86 Peak VO2: 22.6 (64.1% predicted peak VO2) VE/VCO2 slope: 29.4 OUES: 2.49 Peak RER: 0.90  Labs 06/29/12 Potassium 5.4 Creatinine 1.05 07/17/12 Dig Level 0.5 Potassium 4.0 Creatinine 0.95 Pro BNP 244 11/20/12 K 4.0  Creatinine 1.18 12/18/12: K 4.7, Cr 0.96 05/16/13 K 4.5 Creatinine 0.91  ROS: All systems negative except as listed in HPI, PMH and Problem List.  Past Medical History  Diagnosis Date  . Hypertension   . Hyperlipidemia   . CHF (congestive heart failure)   . C. difficile diarrhea   . H/O blood clots 2012    in leg    Current Outpatient Prescriptions  Medication Sig Dispense Refill  . carvedilol (COREG) 6.25 MG tablet Take 1 tablet (6.25 mg total) by mouth 2 (two) times daily. 60 tablet 3  . furosemide (LASIX) 40 MG tablet Take 40 mg by mouth as needed.     . gabapentin (NEURONTIN) 100 MG capsule Take 6 capsules (600 mg total) by mouth 3 (three) times daily. 90 capsule 3  . losartan (COZAAR) 100 MG tablet Take 1 tablet (100 mg total) by mouth daily. 30 tablet 3  . risperiDONE (RISPERDAL) 1 MG tablet Take 1 mg by mouth at bedtime.    Marland Kitchen spironolactone (ALDACTONE) 25 MG tablet Take 25 mg by mouth daily.    Marland Kitchen venlafaxine XR (EFFEXOR XR) 75 MG 24 hr capsule Take 1 pill by mouth daily for 1 week, then increase to 2 pills by mouth daily and maintain for 4 weeks. (Patient not taking: Reported on 12/31/2013) 60 capsule 2   No current facility-administered medications for  this encounter.    Filed Vitals:   12/31/13 1001  BP: 144/90  Pulse: 63  Weight: 191 lb 1.9 oz (86.691 kg)  SpO2: 98%   PHYSICAL EXAM: General:  Well appearing. No resp difficulty HEENT: normal Neck: supple. JVP 5-6. Carotids 2+ bilaterally; no bruits. No lymphadenopathy or thryomegaly appreciated. Cor: PMI normal. Regular rate & rhythm. No rubs, or murmurs.  Lungs: clear Abdomen: soft, nontender, nondistention. No hepatosplenomegaly. No bruits or masses. Good bowel sounds. Extremities: no cyanosis, clubbing, rash, edema Neuro: alert & orientedx3, cranial nerves grossly intact. Moves all 4 extremities w/o difficulty. Affect pleasant.   ASSESSMENT & PLAN:  1) Chronic systolic HF: NICM, ECHO 05/3974 EF ~35-40% -  NYHA II symptoms.  Volume status stable. Continue lasix 40 mg as needed.  Continue carvedilol 6.25 mg twice a day will not up titrate due to low heart rate.   Restart  losartan 100 mg daily. Not sure he is taking. I have stressed medication compliance.  Will not restart spiro due to noncompliance.  Trying to get disability.  Labs today from [employer].  -- Reinforced the need and importance of daily weights, a low sodium diet, and fluid restriction (less than 2 L a day). Instructed to call the HF clinic if weight increases more than 3 lbs overnight or 5 lbs in a week.   2) Polysubstance abuse - remains drug and ETOH free and attending [facility] meetings and group sessions weekly. 3) Current smoker - continues to smoke about 1-2 cigarettes a day. Encouraged stop.   4) HTN - Elevated but has not been taking his medications. Continue current regimen. Will not restart spironolactone due to noncompliance. Continue carvedilol 6.25 mg twice a day and losartan 100 mg a day.  5) OSA- Per DrAlva. Not using CPAP. Encouraged to use nightly.      Follow up in 6 weeks .  CLEGG,AMY, NP-C 10:19 AM

## 2013-12-31 NOTE — Patient Instructions (Signed)
Follow up in 6 weeks  Do the following things EVERYDAY: 1) Weigh yourself in the morning before breakfast. Write it down and keep it in a log. 2) Take your medicines as prescribed 3) Eat low salt foods-Limit salt (sodium) to 2000 mg per day.  4) Stay as active as you can everyday 5) Limit all fluids for the day to less than 2 liters 

## 2014-01-20 ENCOUNTER — Encounter: Payer: Self-pay | Admitting: Physical Medicine & Rehabilitation

## 2014-01-20 ENCOUNTER — Ambulatory Visit (HOSPITAL_COMMUNITY)
Admission: RE | Admit: 2014-01-20 | Discharge: 2014-01-20 | Disposition: A | Discharge status: Home / Self Care | Payer: Medicaid Other | Source: Ambulatory Visit | Attending: Physical Medicine & Rehabilitation | Admitting: Physical Medicine & Rehabilitation

## 2014-01-20 ENCOUNTER — Encounter: Payer: Medicaid Other | Attending: Physical Medicine & Rehabilitation

## 2014-01-20 ENCOUNTER — Ambulatory Visit (HOSPITAL_BASED_OUTPATIENT_CLINIC_OR_DEPARTMENT_OTHER): Payer: Medicaid Other | Admitting: Physical Medicine & Rehabilitation

## 2014-01-20 VITALS — BP 154/101 | HR 54 | Resp 14

## 2014-01-20 DIAGNOSIS — M791 Myalgia: Secondary | ICD-10-CM | POA: Diagnosis not present

## 2014-01-20 DIAGNOSIS — G8929 Other chronic pain: Secondary | ICD-10-CM | POA: Insufficient documentation

## 2014-01-20 DIAGNOSIS — M545 Low back pain, unspecified: Secondary | ICD-10-CM

## 2014-01-20 DIAGNOSIS — M7918 Myalgia, other site: Secondary | ICD-10-CM | POA: Insufficient documentation

## 2014-01-20 DIAGNOSIS — M542 Cervicalgia: Secondary | ICD-10-CM | POA: Insufficient documentation

## 2014-01-20 MED ORDER — CYCLOBENZAPRINE HCL 5 MG PO TABS: 5.0000 mg | ORAL_TABLET | Freq: Three times a day (TID) | ORAL | Status: DC | PRN

## 2014-01-20 NOTE — Patient Instructions (Signed)
Please pick up the muscle relaxant at your pharmacy  Please get your x-rays across the street at [facility]  The therapy department will give you a call to schedule your therapy visits

## 2014-01-20 NOTE — Progress Notes (Signed)
Subjective:    Patient ID: Charles Cervantes, male    DOB: 1960-07-22, 54 y.o.   MRN: [id number]  HPI CHIEF COMPLAINT IS RIGHT SHOULDER AND UPPER BACK PAIN  Onset of pain 1-2 months ago. Insidious onset no history of trauma to that area. Occasionally gets tingling and numbness in that area. Patient feels symptoms are worsening with time. He has tried gabapentin with No good relief. Has tried over-the-counter medications without effect. Takes venlafaxine as well and doesn't feel this helps with pain. Gabapentin 600 mg 3 times per day, Patient feels this is not effective   Has not tried any physical therapy or any injections to that area. No imaging studies.  Secondary complaints include low back pain as well as hip pain as well as knee pain. Pain Inventory Average Pain 8 Pain Right Now 8 My pain is constant, sharp and burning  In the last 24 hours, has pain interfered with the following? General activity 7 Relation with others 7 Enjoyment of life 9 What TIME of day is your pain at its worst? morning Sleep (in general) Poor  Pain is worse with: walking, bending, sitting and standing Pain improves with: medication Relief from Meds: 0  Mobility walk without assistance how many minutes can you walk? 10 ability to climb steps?  yes do you drive?  no transfers alone  Function disabled: date disabled 03/23/2011 Do you have any goals in this area?  yes  Neuro/Psych weakness numbness tingling trouble walking  Prior Studies Any changes since last visit?  no  Physicians involved in your care Any changes since last visit?  no   History reviewed. No pertinent family history. History   Social History  . Marital Status: Single    Spouse Name: N/A    Number of Children: 0  . Years of Education: N/A   Occupational History  . unemployed    Social History Main Topics  . Smoking status: Current Some Day Smoker -- 0.20 packs/day for 25 years    Types: Cigarettes  .  Smokeless tobacco: Never Used     Comment: 5-6 cigs per day  . Alcohol Use: No     Comment: stopped in april 2014  . Drug Use: No  . Sexual Activity: No   Other Topics Concern  . None   Social History Narrative   Past Surgical History  Procedure Laterality Date  . Left second finger surgery    . Left and right heart catheterization with coronary angiogram N/A 11/20/2012    Procedure: LEFT AND RIGHT HEART CATHETERIZATION WITH CORONARY ANGIOGRAM;  Surgeon: Jolaine Artist, MD;  Location: [facility] CATH LAB;  Service: Cardiovascular;  Laterality: N/A;   Past Medical History  Diagnosis Date  . Hypertension   . Hyperlipidemia   . CHF (congestive heart failure)   . C. difficile diarrhea   . H/O blood clots 2012    in leg   BP 154/101 mmHg  Pulse 54  Resp 14  SpO2 99%  Opioid Risk Score: 7 recheck with additional hx Was 8+= >90% risk of opioid misuse Fall Risk Score:   Review of Systems  Musculoskeletal: Positive for gait problem.  Neurological: Positive for weakness and numbness.       Tingling  All other systems reviewed and are negative.      Objective:   Physical Exam  Constitutional: He is oriented to person, place, and time.  Musculoskeletal:       Right shoulder: Normal.  Cervical back: He exhibits decreased range of motion and tenderness.       Thoracic back: He exhibits decreased range of motion and tenderness.       Lumbar back: He exhibits tenderness. He exhibits normal range of motion.  Tenderness to palpation bilateral upper trapezius muscles as well as the lower cervical, mid and lower thoracic as well as lower lumbar paraspinals  Cervical range of motion reduced with right-sided lateral bending as well as right sided rotation.  Neurological: He is alert and oriented to person, place, and time. He has normal strength. He displays no atrophy. Coordination and gait normal.  Reflex Scores:      Tricep reflexes are 1+ on the right side and 1+ on the left  side.      Bicep reflexes are 0 on the right side and 0 on the left side.      Brachioradialis reflexes are 0 on the right side and 0 on the left side.      Patellar reflexes are 1+ on the right side and 1+ on the left side.      Achilles reflexes are 1+ on the right side and 1+ on the left side. Sensation is reduced to vibratory sense in both feet, intact to pinprick bilateral upper and lower limbs  Motor strength is 5/5 bilateral deltoid, biceps, triceps, grip, hip flexors, knee extensor, ankle dorsi flexor plantar flexor  Spurling's test is negative Straight leg raise test is negative          Assessment & Plan:  1. Right-sided neck, upper back and posterior shoulder pain. Examination is most consistent with myofascial pain.  No clear cut signs of cervical radiculopathy. Do not suspect primary shoulder pathology. Will start with Flexeril 5 mg 3 times a day Referral to outpatient PT for cervical thoracic stabilization Consider trigger point injections next visit if not much better May consider EMG/NCV if patient not progressing as expected  2. Low back pain chronic no clear-cut radicular signs or sciatica. We'll send to physical therapy for lumbar stabilization. Flexeril may be helpful in this case as well. No immediate plans for any type of lumbar spine injection procedures  3. Chronic pain syndrome we also discussed his elevated opioid risk and that he is not a candidate for chronic narcotic analgesic use. He agrees with this, going through active substance abuse counseling. May use nonnarcotic agents, may consider low-dose tramadol

## 2014-02-13 ENCOUNTER — Ambulatory Visit: Payer: Medicaid Other | Admitting: Physical Therapy

## 2014-02-14 ENCOUNTER — Encounter: Payer: Self-pay | Admitting: Physical Medicine & Rehabilitation

## 2014-02-14 ENCOUNTER — Ambulatory Visit (HOSPITAL_BASED_OUTPATIENT_CLINIC_OR_DEPARTMENT_OTHER): Payer: Medicaid Other | Admitting: Physical Medicine & Rehabilitation

## 2014-02-14 ENCOUNTER — Encounter: Payer: Medicaid Other | Attending: Physical Medicine & Rehabilitation

## 2014-02-14 VITALS — BP 138/93 | HR 70 | Resp 14

## 2014-02-14 DIAGNOSIS — F4310 Post-traumatic stress disorder, unspecified: Secondary | ICD-10-CM

## 2014-02-14 DIAGNOSIS — M542 Cervicalgia: Secondary | ICD-10-CM | POA: Insufficient documentation

## 2014-02-14 DIAGNOSIS — M7918 Myalgia, other site: Secondary | ICD-10-CM

## 2014-02-14 DIAGNOSIS — M545 Low back pain: Secondary | ICD-10-CM | POA: Insufficient documentation

## 2014-02-14 DIAGNOSIS — G8929 Other chronic pain: Secondary | ICD-10-CM | POA: Insufficient documentation

## 2014-02-14 DIAGNOSIS — M791 Myalgia: Secondary | ICD-10-CM | POA: Insufficient documentation

## 2014-02-14 DIAGNOSIS — F1910 Other psychoactive substance abuse, uncomplicated: Secondary | ICD-10-CM

## 2014-02-14 DIAGNOSIS — M25511 Pain in right shoulder: Secondary | ICD-10-CM

## 2014-02-14 MED ORDER — CYCLOBENZAPRINE HCL 10 MG PO TABS: 5.0000 mg | ORAL_TABLET | Freq: Three times a day (TID) | ORAL | Status: DC | PRN

## 2014-02-14 NOTE — Progress Notes (Signed)
Subjective:    Patient ID: Charles Cervantes, male    DOB: [birth year], 54 y.o.   MRN: [id number]  HPI Patient returns today with increasing pain in his right side of his neck Patient has not started any physical therapy yet Has some pain that radiates into his shoulder. He has been on gabapentin 600 mg 3 times a day which is not helpful for him.  Pain Inventory Average Pain 8 Pain Right Now 8 My pain is stabbing, tingling and aching  In the last 24 hours, has pain interfered with the following? General activity 8 Relation with others 8 Enjoyment of life 8 What TIME of day is your pain at its worst? all Sleep (in general) Poor  Pain is worse with: walking, bending and sitting Pain improves with: medication Relief from Meds: 0  Mobility walk without assistance how many minutes can you walk? 10 ability to climb steps?  no do you drive?  no  Function disabled: date disabled .  Neuro/Psych weakness tingling depression  Prior Studies Any changes since last visit?  no  Physicians involved in your care Any changes since last visit?  no   History reviewed. No pertinent family history. History   Social History  . Marital Status: Single    Spouse Name: N/A  . Number of Children: 0  . Years of Education: N/A   Occupational History  . unemployed    Social History Main Topics  . Smoking status: Current Some Day Smoker -- 0.20 packs/day for 25 years    Types: Cigarettes  . Smokeless tobacco: Never Used     Comment: 5-6 cigs per day  . Alcohol Use: No     Comment: stopped in april 2014  . Drug Use: No  . Sexual Activity: No   Other Topics Concern  . None   Social History Narrative   Past Surgical History  Procedure Laterality Date  . Left second finger surgery    . Left and right heart catheterization with coronary angiogram N/A 11/20/2012    Procedure: LEFT AND RIGHT HEART CATHETERIZATION WITH CORONARY ANGIOGRAM;  Surgeon: Jolaine Artist, MD;   Location: [facility] CATH LAB;  Service: Cardiovascular;  Laterality: N/A;   Past Medical History  Diagnosis Date  . Hypertension   . Hyperlipidemia   . CHF (congestive heart failure)   . C. difficile diarrhea   . H/O blood clots 2012    in leg   There were no vitals taken for this visit.  Opioid Risk Score:   High risk Fall Risk Score:    Review of Systems  Respiratory: Positive for apnea and shortness of breath.   Endocrine:       High blood sugar  Neurological: Positive for weakness.       Tingling   Psychiatric/Behavioral: Positive for dysphoric mood.  All other systems reviewed and are negative.      Objective:   Physical Exam  Constitutional: He is oriented to person, place, and time. He appears well-developed and well-nourished.  Neurological: He is alert and oriented to person, place, and time.  Psychiatric: His behavior is normal.  Nursing note and vitals reviewed.  Motor strength is 5/5 bilateral deltoid, biceps, triceps, grip Neck has 75% range flexion extension lateral bending and rotation  negative foraminal compression test Patient with guarding in the right shoulder with all movements. Tenderness over the upper trapezius semispinalis capitis, levator, infraspinatus  Mood and affect are appropriate Pain at end range of shoulder external  rotation and internal rotation and abduction Equivocal impingement testing     Assessment & Plan:  1. Neck pain, chronic, multifactorial Review of x-rays demonstrates evidence of cervical spondylosis and degenerative disc Mainly at C5-6 and C6-7 levels. No clear cut signs of cervical radiculopathy. Recommend PT which is scheduled for Monday weather permitting  2. Right shoulder pain, has myofascial component but cannot rule out underlying arthropathy or possible adhesive capsulitis, we will check x-ray   3. Chronic low back pain this is not a major complaint at the current time.

## 2014-02-14 NOTE — Patient Instructions (Signed)
Trigger Point Injection Trigger points are areas where you have muscle pain. A trigger point injection is a shot given in the trigger point to relieve that pain. A trigger point might feel like a knot in your muscle. It hurts to press on a trigger point. Sometimes the pain spreads out (radiates) to other parts of the body. For example, pressing on a trigger point in your shoulder might cause pain in your arm or neck. You might have one trigger point. Or, you might have more than one. People often have trigger points in their upper back and lower back. They also occur often in the neck and shoulders. Pain from a trigger point lasts for a long time. It can make it hard to keep moving. You might not be able to do the exercise or physical therapy that could help you deal with the pain. A trigger point injection may help. It does not work for everyone. But, it may relieve your pain for a few days or a few months. A trigger point injection does not cure long-lasting (chronic) pain. LET YOUR CAREGIVER KNOW ABOUT:  Any allergies (especially to latex, lidocaine, or steroids).  Blood-thinning medicines that you take. These drugs can lead to bleeding or bruising after an injection. They include:  Aspirin.  Ibuprofen.  Clopidogrel.  Warfarin.  Other medicines you take. This includes all vitamins, herbs, eyedrops, over-the-counter medicines, and creams.  Use of steroids.  Recent infections.  Past problems with numbing medicines.  Bleeding problems.  Surgeries you have had.  Other health problems. RISKS AND COMPLICATIONS A trigger point injection is a safe treatment. However, problems may develop, such as:  Minor side effects usually go away in 1 to 2 days. These may include:  Soreness.  Bruising.  Stiffness.  More serious problems are rare. But, they may include:  Bleeding under the skin (hematoma).  Skin infection.  Breaking off of the needle under your skin.  Lung  puncture.  The trigger point injection may not work for you. BEFORE THE PROCEDURE You may need to stop taking any medicine that thins your blood. This is to prevent bleeding and bruising. Usually these medicines are stopped several days before the injection. No other preparation is needed. PROCEDURE  A trigger point injection can be given in your caregiver's office or in a clinic. Each injection takes 2 minutes or less.  Your caregiver will feel for trigger points. The caregiver may use a marker to circle the area for the injection.  The skin over the trigger point will be washed with a germ-killing (antiseptic) solution.  The caregiver pinches the spot for the injection.  Then, a very thin needle is used for the shot. You may feel pain or a twitching feeling when the needle enters the trigger point.  A numbing solution may be injected into the trigger point. Sometimes a drug to keep down swelling, redness, and warmth (inflammation) is also injected.  Your caregiver moves the needle around the trigger zone until the tightness and twitching goes away.  After the injection, your caregiver may put gentle pressure over the injection site.  Then it is covered with a bandage. AFTER THE PROCEDURE  You can go right home after the injection.  The bandage can be taken off after a few hours.  You may feel sore and stiff for 1 to 2 days.  Go back to your regular activities slowly. Your caregiver may ask you to stretch your muscles. Do not do anything that takes   extra energy for a few days.  Follow your caregiver's instructions to manage and treat other pain. Document Released: 12/09/2010 Document Revised: 04/16/2012 Document Reviewed: 12/09/2010 [facility] Patient Information 2015 [place], [place]. This information is not intended to replace advice given to you by your health care provider. Make sure you discuss any questions you have with your health care provider.

## 2014-02-14 NOTE — Progress Notes (Signed)
Trigger Point Injection  Indication: Right periscapular Myofascial pain not relieved by medication management and other conservative care.  Informed consent was obtained after describing risk and benefits of the procedure with the patient, this includes bleeding, bruising, infection and medication side effects.  The patient wishes to proceed and has given written consent.  The patient was placed in a seated position.  The Right suboccipital, right trapezius, right upper medial scapular border, right infraspinatus fossa area was marked and prepped with Betadine.  It was entered with a 25-gauge 1-1/2 inch needle and 1 mL of 1% lidocaine was injected into each of 5 trigger points, 1/2 cc lidocaine into the suboccipital area, after negative draw back for blood.  The patient tolerated the procedure well.  Post procedure instructions were given.

## 2014-02-17 ENCOUNTER — Ambulatory Visit: Payer: Self-pay

## 2014-02-17 ENCOUNTER — Ambulatory Visit: Payer: Medicaid Other | Admitting: Physical Therapy

## 2014-02-17 ENCOUNTER — Ambulatory Visit: Payer: Medicaid Other | Admitting: Physical Medicine & Rehabilitation

## 2014-02-17 ENCOUNTER — Other Ambulatory Visit: Payer: Self-pay | Admitting: Physical Medicine & Rehabilitation

## 2014-02-18 ENCOUNTER — Encounter (HOSPITAL_COMMUNITY): Payer: Self-pay

## 2014-02-26 ENCOUNTER — Encounter: Payer: Self-pay | Admitting: Family Medicine

## 2014-02-26 ENCOUNTER — Other Ambulatory Visit: Payer: Self-pay | Admitting: Family Medicine

## 2014-02-27 ENCOUNTER — Telehealth: Payer: Self-pay | Admitting: Family Medicine

## 2014-02-27 NOTE — Telephone Encounter (Signed)
PFCC: Did a home visit yesterday. Depression screening score was a 16. Used PHQ9 test.  He is going to [facility] a week for group sessions and once a month for individual services

## 2014-02-27 NOTE — Telephone Encounter (Signed)
Will forward to MD to make him aware. Demarion Pondexter,CMA  

## 2014-02-28 ENCOUNTER — Ambulatory Visit: Payer: Medicaid Other | Admitting: Physical Therapy

## 2014-03-02 ENCOUNTER — Emergency Department (HOSPITAL_COMMUNITY)
Admission: EM | Admit: 2014-03-02 | Discharge: 2014-03-02 | Disposition: A | Discharge status: Home / Self Care | Payer: Medicaid Other | Attending: Emergency Medicine | Admitting: Emergency Medicine

## 2014-03-02 ENCOUNTER — Encounter (HOSPITAL_COMMUNITY): Payer: Self-pay | Admitting: Emergency Medicine

## 2014-03-02 ENCOUNTER — Emergency Department (HOSPITAL_COMMUNITY): Payer: Medicaid Other

## 2014-03-02 DIAGNOSIS — S61215A Laceration without foreign body of left ring finger without damage to nail, initial encounter: Secondary | ICD-10-CM

## 2014-03-02 DIAGNOSIS — Z8619 Personal history of other infectious and parasitic diseases: Secondary | ICD-10-CM | POA: Diagnosis not present

## 2014-03-02 DIAGNOSIS — Z79899 Other long term (current) drug therapy: Secondary | ICD-10-CM | POA: Insufficient documentation

## 2014-03-02 DIAGNOSIS — W231XXA Caught, crushed, jammed, or pinched between stationary objects, initial encounter: Secondary | ICD-10-CM | POA: Insufficient documentation

## 2014-03-02 DIAGNOSIS — Y9289 Other specified places as the place of occurrence of the external cause: Secondary | ICD-10-CM | POA: Diagnosis not present

## 2014-03-02 DIAGNOSIS — Z8639 Personal history of other endocrine, nutritional and metabolic disease: Secondary | ICD-10-CM | POA: Insufficient documentation

## 2014-03-02 DIAGNOSIS — Y998 Other external cause status: Secondary | ICD-10-CM | POA: Insufficient documentation

## 2014-03-02 DIAGNOSIS — S6992XA Unspecified injury of left wrist, hand and finger(s), initial encounter: Secondary | ICD-10-CM | POA: Diagnosis present

## 2014-03-02 DIAGNOSIS — Y9389 Activity, other specified: Secondary | ICD-10-CM | POA: Diagnosis not present

## 2014-03-02 DIAGNOSIS — I509 Heart failure, unspecified: Secondary | ICD-10-CM | POA: Diagnosis not present

## 2014-03-02 DIAGNOSIS — Z720 Tobacco use: Secondary | ICD-10-CM | POA: Diagnosis not present

## 2014-03-02 DIAGNOSIS — Z9889 Other specified postprocedural states: Secondary | ICD-10-CM | POA: Insufficient documentation

## 2014-03-02 DIAGNOSIS — I10 Essential (primary) hypertension: Secondary | ICD-10-CM | POA: Diagnosis not present

## 2014-03-02 MED ORDER — OXYCODONE-ACETAMINOPHEN 5-325 MG PO TABS: 1.0000 | ORAL_TABLET | Freq: Four times a day (QID) | ORAL | Status: DC | PRN

## 2014-03-02 MED ORDER — LIDOCAINE HCL (PF) 1 % IJ SOLN
5.0000 mL | Freq: Once | INTRAMUSCULAR | Status: AC
  Administered 2014-03-02: 5 mL via INTRADERMAL
  Filled 2014-03-02: qty 5

## 2014-03-02 MED ORDER — OXYCODONE-ACETAMINOPHEN 5-325 MG PO TABS
2.0000 | ORAL_TABLET | Freq: Once | ORAL | Status: AC
  Administered 2014-03-02: 2 via ORAL
  Filled 2014-03-02: qty 2

## 2014-03-02 MED ORDER — LIDOCAINE HCL (PF) 1 % IJ SOLN
5.0000 mL | Freq: Once | INTRAMUSCULAR | Status: AC
  Administered 2014-03-02: 5 mL
  Filled 2014-03-02: qty 5

## 2014-03-02 NOTE — Discharge Instructions (Signed)
Take percocet for severe pain only. No driving or operating heavy machinery while taking percocet. This medication may cause drowsiness. Apply ice to your finger. Follow up with Dr. Jerline Pain in 7 days for recheck and 14 days for suture removal.  Laceration Care, Adult A laceration is a cut or lesion that goes through all layers of the skin and into the tissue just beneath the skin. TREATMENT  Some lacerations may not require closure. Some lacerations may not be able to be closed due to an increased risk of infection. It is important to see your caregiver as soon as possible after an injury to minimize the risk of infection and maximize the opportunity for successful closure. If closure is appropriate, pain medicines may be given, if needed. The wound will be cleaned to help prevent infection. Your caregiver will use stitches (sutures), staples, wound glue (adhesive), or skin adhesive strips to repair the laceration. These tools bring the skin edges together to allow for faster healing and a better cosmetic outcome. However, all wounds will heal with a scar. Once the wound has healed, scarring can be minimized by covering the wound with sunscreen during the day for 1 full year. HOME CARE INSTRUCTIONS  For sutures or staples:  Keep the wound clean and dry.  If you were given a bandage (dressing), you should change it at least once a day. Also, change the dressing if it becomes wet or dirty, or as directed by your caregiver.  Wash the wound with soap and water 2 times a day. Rinse the wound off with water to remove all soap. Pat the wound dry with a clean towel.  After cleaning, apply a thin layer of the antibiotic ointment as recommended by your caregiver. This will help prevent infection and keep the dressing from sticking.  You may shower as usual after the first 24 hours. Do not soak the wound in water until the sutures are removed.  Only take over-the-counter or prescription medicines for pain,  discomfort, or fever as directed by your caregiver.  Get your sutures or staples removed as directed by your caregiver. For skin adhesive strips:  Keep the wound clean and dry.  Do not get the skin adhesive strips wet. You may bathe carefully, using caution to keep the wound dry.  If the wound gets wet, pat it dry with a clean towel.  Skin adhesive strips will fall off on their own. You may trim the strips as the wound heals. Do not remove skin adhesive strips that are still stuck to the wound. They will fall off in time. For wound adhesive:  You may briefly wet your wound in the shower or bath. Do not soak or scrub the wound. Do not swim. Avoid periods of heavy perspiration until the skin adhesive has fallen off on its own. After showering or bathing, gently pat the wound dry with a clean towel.  Do not apply liquid medicine, cream medicine, or ointment medicine to your wound while the skin adhesive is in place. This may loosen the film before your wound is healed.  If a dressing is placed over the wound, be careful not to apply tape directly over the skin adhesive. This may cause the adhesive to be pulled off before the wound is healed.  Avoid prolonged exposure to sunlight or tanning lamps while the skin adhesive is in place. Exposure to ultraviolet light in the first year will darken the scar.  The skin adhesive will usually remain in place for  5 to 10 days, then naturally fall off the skin. Do not pick at the adhesive film. You may need a tetanus shot if:  You cannot remember when you had your last tetanus shot.  You have never had a tetanus shot. If you get a tetanus shot, your arm may swell, get red, and feel warm to the touch. This is common and not a problem. If you need a tetanus shot and you choose not to have one, there is a rare chance of getting tetanus. Sickness from tetanus can be serious. SEEK MEDICAL CARE IF:   You have redness, swelling, or increasing pain in the  wound.  You see a red line that goes away from the wound.  You have yellowish-white fluid (pus) coming from the wound.  You have a fever.  You notice a bad smell coming from the wound or dressing.  Your wound breaks open before or after sutures have been removed.  You notice something coming out of the wound such as wood or glass.  Your wound is on your hand or foot and you cannot move a finger or toe. SEEK IMMEDIATE MEDICAL CARE IF:   Your pain is not controlled with prescribed medicine.  You have severe swelling around the wound causing pain and numbness or a change in color in your arm, hand, leg, or foot.  Your wound splits open and starts bleeding.  You have worsening numbness, weakness, or loss of function of any joint around or beyond the wound.  You develop painful lumps near the wound or on the skin anywhere on your body. MAKE SURE YOU:   Understand these instructions.  Will watch your condition.  Will get help right away if you are not doing well or get worse. Document Released: 12/20/2004 Document Revised: 03/14/2011 Document Reviewed: 06/15/2010 [facility] Patient Information 2015 [place], [place]. This information is not intended to replace advice given to you by your health care provider. Make sure you discuss any questions you have with your health care provider.

## 2014-03-02 NOTE — ED Provider Notes (Signed)
CSN: [id number]     Arrival date & time 03/02/14  1109 History  This chart was scribed for non-physician practitioner, Lucien Mons, PA-C working with Blanchie Dessert, MD by Evelene Croon, ED Scribe. This patient was seen in room TR08C/TR08C and the patient's care was started at 11:28 AM.    Chief Complaint  Patient presents with  . Finger Injury   The history is provided by the patient. No language interpreter was used.     HPI Comments:  Charles Cervantes is a 54 y.o. male who presents to the Emergency Department complaining of 10/10 constant pain to his left ring finger following injury ~40 minutes PTA. He states he was hooking up a trailer and the jack slipped and the hitch smashed his finger. His tetanus is UTD within the last year. No alleviating factors noted.   Past Medical History  Diagnosis Date  . Hypertension   . Hyperlipidemia   . CHF (congestive heart failure)   . C. difficile diarrhea   . H/O blood clots 2012    in leg   Past Surgical History  Procedure Laterality Date  . Left second finger surgery    . Left and right heart catheterization with coronary angiogram N/A 11/20/2012    Procedure: LEFT AND RIGHT HEART CATHETERIZATION WITH CORONARY ANGIOGRAM;  Surgeon: Jolaine Artist, MD;  [facility] C[facility] LAB;  Service: Cardiovascular;  Laterality: N/A;   No family history on file. History  Substance Use Topics  . Smoking status: Current Some Day Smoker -- 0.20 packs/day for 25 years    Types: Cigarettes  . Smokeless tobacco: Never Used     Comment: 5-6 cigs per day  . Alcohol Use: No     Comment: stopped in april 2014    Review of Systems  Musculoskeletal: Positive for myalgias and arthralgias.      Allergies  Ace inhibitors  Home Medications   Prior to Admission medications   Medication Sig Start Date End Date Taking? Authorizing Provider  carvedilol (COREG) 6.25 MG tablet Take 1 tablet (6.25 mg total) by mouth 2 (two) times daily. 04/22/13   Amy D  Ninfa Meeker, NP  cyclobenzaprine (FLEXERIL) 10 MG tablet Take 0.5 tablets (5 mg total) by mouth 3 (three) times daily as needed for muscle spasms. 02/14/14   Charlett Blake, MD  cyclobenzaprine (FLEXERIL) 5 MG tablet Take 1 tablet (5 mg total) by mouth 3 (three) times daily as needed for muscle spasms. 02/18/14   Charlett Blake, MD  furosemide (LASIX) 40 MG tablet Take 40 mg by mouth as needed.  11/16/12   Jolaine Artist, MD  gabapentin (NEURONTIN) 100 MG capsule Take 6 capsules (600 mg total) by mouth 3 (three) times daily. 11/04/13   Dimas Chyle, MD  losartan (COZAAR) 100 MG tablet Take 1 tablet (100 mg total) by mouth daily. 04/26/13   Amy D Ninfa Meeker, NP  oxyCODONE-acetaminophen (PERCOCET) 5-325 MG per tablet Take 1-2 tablets by mouth every 6 (six) hours as needed for severe pain. 03/02/14   Carman Ching, PA-C  risperiDONE (RISPERDAL) 1 MG tablet Take 1 mg by mouth at bedtime.    Historical Provider, MD  spironolactone (ALDACTONE) 25 MG tablet Take 25 mg by mouth daily.    Historical Provider, MD  venlafaxine XR (EFFEXOR XR) 75 MG 24 hr capsule Take 1 pill by mouth daily for 1 week, then increase to 2 pills by mouth daily and maintain for 4 weeks. 11/04/13   Dimas Chyle, MD  BP 149/108 mmHg  Pulse 89  Temp(Src) 97.6 F (36.4 C)  Resp 16  Ht 6\' 1"  (1.854 m)  Wt 185 lb (83.915 kg)  BMI 24.41 kg/m2  SpO2 98% Physical Exam  Constitutional: He is oriented to person, place, and time. He appears well-developed and well-nourished. No distress.  HENT:  Head: Normocephalic and atraumatic.  Eyes: Conjunctivae and EOM are normal.  Neck: Normal range of motion. Neck supple.  Cardiovascular: Normal rate, regular rhythm and normal heart sounds.   Pulmonary/Chest: Effort normal and breath sounds normal.  Musculoskeletal:       Hands: 2 cm curved deep laceration on palmar aspect of middle phalanx of left ring finger. Bleeding controlled. Small < 1 cm laceration to the dorsal aspect of the phalanx.  Bleeding controlled. Refill less than 3 seconds. Able to flex and extend finger at MCP and PIP, unable to flex finger at DIP. Sensation intact.  Neurological: He is alert and oriented to person, place, and time.  Skin: Skin is warm and dry.  Psychiatric: He has a normal mood and affect. His behavior is normal.  Nursing note and vitals reviewed.   ED Course  NERVE BLOCK Date/Time: 03/02/2014 1:39 PM Performed by: Carman Ching Authorized by: Carman Ching Consent: Verbal consent obtained. Risks and benefits: risks, benefits and alternatives were discussed Consent given by: patient Patient understanding: patient states understanding of the procedure being performed Patient identity confirmed: arm band and verbally with patient Indications: pain relief and extensive wound Body area: upper extremity Nerve: digital Laterality: left Patient sedated: no Preparation: Patient was prepped and draped in the usual sterile fashion. Patient position: sitting Needle gauge: 25 G Location technique: anatomical landmarks Local anesthetic: lidocaine 1% without epinephrine Anesthetic total: 3 ml Outcome: pain improved    LACERATION REPAIR Performed by: Lucien Mons Authorized by: Lucien Mons Consent: Verbal consent obtained. Risks and benefits: risks, benefits and alternatives were discussed Consent given by: patient Patient identity confirmed: provided demographic data Prepped and Draped in normal sterile fashion Wound explored  Laceration Location: left ring finger  Laceration Length: 2 cm  No Foreign Bodies seen or palpated  Anesthesia: local infiltration (halfway thru repair, sensation started coming back)  Local anesthetic: lidocaine 1% without epinephrine  Anesthetic total: 2 ml  Irrigation method: syringe Amount of cleaning: standard  Skin closure: 4-0 prolene  Number of sutures: 10 (8 palmar, 2 dorsal)  Technique: simple interrupted  Patient tolerance: Patient  tolerated the procedure well with no immediate complications.  DIAGNOSTIC STUDIES:  Oxygen Saturation is 98% on RA, normal by my interpretation.    COORDINATION OF CARE:  11:31 AM Will order XR and pain meds. Discussed treatment plan with pt at bedside and pt agreed to plan.  12:40 PM Pt updated with XR results. Will perform nerve block. Pt agrees.     Labs Review Labs Reviewed - No data to display  Imaging Review Dg Finger Ring Left  03/02/2014   CLINICAL DATA:  smashed his finger in between trailer hitch and trailer. Pt presents with laceration to left ring finger.  EXAM: LEFT RING FINGER 2+V  COMPARISON:  None.  FINDINGS: There is a soft tissue laceration over the middle phalanx. No foreign body. No fracture or dislocation.  IMPRESSION: No fracture or foreign body.   Electronically Signed   By: Suzy Bouchard M.D.   On: 03/02/2014 12:23     EKG Interpretation None      MDM   Final diagnoses:  Laceration  of left ring finger w/o foreign body w/o damage to nail, initial encounter   NAD. Initially unable to fully flex at DIP, however after digital block and pain relief, he is able to flex at DIP actively. X-ray without any acute finding. Laceration repaired. Placed in finger splint. Follow-up with PCP in one week for recheck, and 14 days for suture removal. Wound care given. Rx percocet. Tdap UTD. Regarding BP, asymptomatic, he did take his blood pressure medication today, however states he was in a significant amount of pain. Advised him to discuss this with his PCP. Stable for discharge. Return precautions given. Patient states understanding of treatment care plan and is agreeable.  Discussed with attending Dr. Maryan Rued who agrees with plan of care.  I personally performed the services described in this documentation, which was scribed in my presence. The recorded information has been reviewed and is accurate.   Carman Ching, PA-C 03/02/14 1413  Blanchie Dessert,  MD 03/02/14 [phone]

## 2014-03-02 NOTE — ED Notes (Signed)
Pt report smashed his finger in between trailer hitch and trailer. Pt presents with laceration to left ring finger.

## 2014-03-03 ENCOUNTER — Encounter: Payer: Self-pay | Admitting: Internal Medicine

## 2014-03-04 ENCOUNTER — Ambulatory Visit (INDEPENDENT_AMBULATORY_CARE_PROVIDER_SITE_OTHER): Payer: Medicaid Other | Admitting: Family Medicine

## 2014-03-04 ENCOUNTER — Encounter: Payer: Self-pay | Admitting: Family Medicine

## 2014-03-04 VITALS — BP 155/100 | HR 80 | Temp 98.6°F | Ht 73.0 in | Wt 198.0 lb

## 2014-03-04 DIAGNOSIS — M792 Neuralgia and neuritis, unspecified: Secondary | ICD-10-CM

## 2014-03-04 DIAGNOSIS — S61219D Laceration without foreign body of unspecified finger without damage to nail, subsequent encounter: Secondary | ICD-10-CM

## 2014-03-04 MED ORDER — OXYCODONE-ACETAMINOPHEN 5-325 MG PO TABS: 1.0000 | ORAL_TABLET | Freq: Four times a day (QID) | ORAL | Status: DC | PRN

## 2014-03-04 MED ORDER — DOCUSATE SODIUM 100 MG PO CAPS: 100.0000 mg | ORAL_CAPSULE | Freq: Two times a day (BID) | ORAL | Status: DC

## 2014-03-04 MED ORDER — KETOROLAC TROMETHAMINE 30 MG/ML IJ SOLN
30.0000 mg | Freq: Once | INTRAMUSCULAR | Status: AC
  Administered 2014-03-04: 30 mg via INTRAMUSCULAR

## 2014-03-04 NOTE — Patient Instructions (Addendum)
Ibuprofen: 600mg  every 6 hours for next 2 days, then decrease to as needed every 6 hours  Oxycodone-acetaminophen 5-325mg  every 6 hours for breakthrough pain. While taking he oxycodone take colace 1-2 times a day to help with constipation.  Try to ice the area for 15 minutes every few hours for the next few days  Come back in 2 weeks for suture removal

## 2014-03-04 NOTE — Progress Notes (Signed)
   Subjective:    Patient ID: Charles Cervantes, male    DOB: Dec 16, 1960, 54 y.o.   MRN: [id number]  HPI  CC: ED f/u  # Finger laceration:  Went to ED 2/28 for finger laceration: had injury while hooking up trailer and hitch smashed his left ring finger  In ED had sutures to close laceration.  X-rays negative for fracture  Given norco 5-325 #10 and used. Was initially getting some relief of pain with this ROS: no numbness/tingling of fingers, no fevers/chills  Review of Systems   See HPI for ROS. All other systems reviewed and are negative.  Past medical history, surgical, family, and social history reviewed and updated in the EMR as appropriate. Objective:  BP 155/100 mmHg  Pulse 80  Temp(Src) 98.6 F (37 C) (Oral)  Ht 6\' 1"  (1.854 m)  Wt 198 lb (89.812 kg)  BMI 26.13 kg/m2 Vitals and nursing note reviewed  General: NAD Ext: left ring finger laceration s/p suture repair approx around 75% circumference of mid-phalange, small amount of drainage/bleeding but overall appears early wound healing. Able to move all fingers left hand, 4th digit flexion limited.  Assessment & Plan:  See Problem List Documentation

## 2014-03-05 DIAGNOSIS — S61219A Laceration without foreign body of unspecified finger without damage to nail, initial encounter: Secondary | ICD-10-CM | POA: Insufficient documentation

## 2014-03-05 NOTE — Assessment & Plan Note (Signed)
2 days post injury, had sutures placed in ED. Still in significant amount of pain, ran out of norco as given #10 in ED. Given still early in course and significant pain refill given #30; noted to have polysubstance abuse in past however he does have significant reason for pain with the given injury. Changed dressing and toradol 30mg  in office today. Encouraged icing, NSAIDs and norco only for severe breakthrough pain. F/u 2 weeks for suture removal.

## 2014-03-06 ENCOUNTER — Encounter: Payer: Self-pay | Admitting: Family Medicine

## 2014-03-06 ENCOUNTER — Ambulatory Visit (HOSPITAL_COMMUNITY)
Admission: RE | Admit: 2014-03-06 | Discharge: 2014-03-06 | Disposition: A | Discharge status: Home / Self Care | Payer: Medicaid Other | Source: Ambulatory Visit | Attending: Cardiology | Admitting: Cardiology

## 2014-03-06 VITALS — BP 142/100 | HR 81 | Wt 201.8 lb

## 2014-03-06 DIAGNOSIS — I5022 Chronic systolic (congestive) heart failure: Secondary | ICD-10-CM

## 2014-03-06 NOTE — Progress Notes (Signed)
I was preceptor for this office visit.  

## 2014-03-06 NOTE — Progress Notes (Signed)
Patient ID: Charles Cervantes, male   DOB: 09-15-1960, 54 y.o.   MRN: [id number]  [employer] [facility]CP: [facility]. Pulmonology: Dr Elsworth Soho  HPI: Charles Cervantes is a 54 yo AAM diagnosed with systolic heart failure in 04/2012 in [place] secondary to NICM (normal cors on cath), EF 20-25%. As well as polysubstance abuse (cocaine, tobacco and alcohol)--> quit in 04/2012.   He moved from [facility] and was admitted to [facility] on 05/01/12 with progressive dyspnea and orthopnea.  Echo on admit showed EF 20-25% with normal RV.  Discharge weight 147 pounds.  He also had NSVT while in house and we continued his Lifevest placed in [place].  Admitted To [facility] in 8/14 with HF. EF reported 50-55% on echo. On 10/2012 bedside echo in our HF Clinic EF 35-40%. In 11/2012 presented to clinic c/o recurrent CP and severe HF symptoms but seemed well compensated on exam R/L cath performed as below.  He returns for follow up. Attempted to see him today however upon entering the room he was using profanity towards me and aggressively moved out of his chair to me.   ECHO  04/2012 EF 20-25% 07/17/12 EF 30-35% 08/2012 EF 50-55% at [facility] ECHO 02/27/13 EF 35-40%  RHC/LHC  11/20/12  RA = 6  RV = 33/3/8  PA = 29/9 (18)  PCW = 8  Fick cardiac output/index = 6.4/3.2  PVR = 1.5 WU  FA sat = 95%  PA sat = 72%, 73%  SVC sat 72% Extensive calcification in proximal and mid LAD with only mild intraluminal stenosis with 20-30% stenosis.    CPX 2/15 FVC 2.90 (62%)  FEV1 2.22 (60%) FEV1/FVC 76%  Resting HR: 61 Peak HR: 111 (66% age predicted max HR) BP rest: 146/90 BP peak: 168/86 Peak VO2: 22.6 (64.1% predicted peak VO2) VE/VCO2 slope: 29.4 OUES: 2.49 Peak RER: 0.90  Labs 06/29/12 Potassium 5.4 Creatinine 1.05 07/17/12 Dig Level 0.5 Potassium 4.0 Creatinine 0.95 Pro BNP 244 11/20/12 K 4.0 Creatinine 1.18 12/18/12: K 4.7, Cr 0.96 05/16/13 K 4.5 Creatinine 0.91  ROS: All systems negative except as listed in HPI, PMH and Problem  List.  Past Medical History  Diagnosis Date  . Hypertension   . Hyperlipidemia   . CHF (congestive heart failure)   . C. difficile diarrhea   . H/O blood clots 2012    in leg    Current Outpatient Prescriptions  Medication Sig Dispense Refill  . carvedilol (COREG) 6.25 MG tablet Take 1 tablet (6.25 mg total) by mouth 2 (two) times daily. 60 tablet 3  . docusate sodium (COLACE) 100 MG capsule Take 1 capsule (100 mg total) by mouth 2 (two) times daily. 30 capsule 0  . furosemide (LASIX) 40 MG tablet Take 40 mg by mouth as needed.     . gabapentin (NEURONTIN) 100 MG capsule Take 6 capsules (600 mg total) by mouth 3 (three) times daily. 90 capsule 3  . losartan (COZAAR) 100 MG tablet Take 1 tablet (100 mg total) by mouth daily. 30 tablet 3  . oxyCODONE-acetaminophen (PERCOCET) 5-325 MG per tablet Take 1 tablet by mouth every 6 (six) hours as needed for severe pain. 30 tablet 0  . risperiDONE (RISPERDAL) 1 MG tablet Take 1 mg by mouth at bedtime.    . cyclobenzaprine (FLEXERIL) 10 MG tablet Take 0.5 tablets (5 mg total) by mouth 3 (three) times daily as needed for muscle spasms. (Patient not taking: Reported on 03/06/2014) 90 tablet 1  . cyclobenzaprine (FLEXERIL) 5  MG tablet Take 1 tablet (5 mg total) by mouth 3 (three) times daily as needed for muscle spasms. (Patient not taking: Reported on 03/06/2014) 90 tablet 1  . venlafaxine XR (EFFEXOR XR) 75 MG 24 hr capsule Take 1 pill by mouth daily for 1 week, then increase to 2 pills by mouth daily and maintain for 4 weeks. 60 capsule 2   No current facility-administered medications for this encounter.    Filed Vitals:   03/06/14 1356  BP: 142/100  Pulse: 81  Weight: 201 lb 12.8 oz (91.536 kg)  SpO2: 97%   PHYSICAL EXAM: Visit not completed he left before exam could be completed.    ASSESSMENT & PLAN: Attempted to see him today however upon entering the room he was using profanity towards me regarding his visit. He stood up from his chair  in an aggressive manner and continued to use profanity about waiting to be seen.   I asked him to stop and he continued and said that he was going to leave.   He was not evaluated today.     CLEGG,AMY, NP-C 2:16 PM

## 2014-03-07 ENCOUNTER — Other Ambulatory Visit: Payer: Self-pay

## 2014-03-07 MED ORDER — CARVEDILOL 6.25 MG PO TABS: 6.2500 mg | ORAL_TABLET | Freq: Two times a day (BID) | ORAL | Status: DC

## 2014-03-10 ENCOUNTER — Ambulatory Visit: Payer: Medicaid Other | Attending: Physical Medicine & Rehabilitation | Admitting: Physical Therapy

## 2014-03-10 ENCOUNTER — Encounter: Payer: Self-pay | Admitting: Physical Therapy

## 2014-03-10 DIAGNOSIS — M25519 Pain in unspecified shoulder: Secondary | ICD-10-CM | POA: Insufficient documentation

## 2014-03-10 DIAGNOSIS — M542 Cervicalgia: Secondary | ICD-10-CM | POA: Diagnosis present

## 2014-03-10 DIAGNOSIS — R209 Unspecified disturbances of skin sensation: Secondary | ICD-10-CM | POA: Diagnosis not present

## 2014-03-10 NOTE — Patient Instructions (Signed)
AROM: Neck Rotation   Turn head slowly to look over one shoulder, then the other. Hold each position __10-15__ seconds. Repeat __2-3 times per set. Do _1-2___ sets per session. Do __2-3__ sessions per day.  http://orth.exer.us/294   Copyright  VHI. All rights reserved.  AROM: Lateral Neck Flexion   Slowly tilt head toward one shoulder, then the other. Hold each position 10-15____ seconds. Repeat _2-3___ times per set. Do ___1-2_ sets per session. Do ___2_ sessions per day.  http://orth.exer.us/296   Copyright  VHI. All rights reserved.  Extension   Hands behind neck, bend head back as far as is comfortable. Hold ___10-15_ seconds. Repeat _2-3_ times. Do __2__ sessions per day. STOP if you get dizzy.  Copyright  VHI. All rights reserved.  AROM: Neck Flexion   Bend head forward. Hold __10-15__ seconds.Nod: Deep Cervical Flexor Retrain - Supine   Nod head, tipping chin down. Tighten muscles in back of throat. Hold _5__ seconds. Do _10__ times, __2_ times per day.

## 2014-03-10 NOTE — Therapy (Signed)
[place], [place], [address] Phone: [phone]   Fax:  [phone]  Physical Therapy Evaluation  Patient Details  Name: Charles Cervantes MRN: [id number] Date of Birth: May 13, 1960 Referring Provider:  Charlett Blake, MD  Encounter Date: 03/10/2014      PT End of Session - 03/10/14 1409    Visit Number 1   Number of Visits 1   PT Start Time 1330   PT Stop Time 1400   PT Time Calculation (min) 30 min   Activity Tolerance Patient tolerated treatment well      Past Medical History  Diagnosis Date  . Hypertension   . Hyperlipidemia   . CHF (congestive heart failure)   . C. difficile diarrhea   . H/O blood clots 2012    in leg    Past Surgical History  Procedure Laterality Date  . Left second finger surgery    . Left and right heart catheterization with coronary angiogram N/A 11/20/2012    Procedure: LEFT AND RIGHT HEART CATHETERIZATION WITH CORONARY ANGIOGRAM;  Surgeon: Jolaine Artist, MD;  Location: [facility] CATH LAB;  Service: Cardiovascular;  Laterality: N/A;    There were no vitals taken for this visit.  Visit Diagnosis:  Neck and shoulder pain  Abnormal arm sensation      Subjective Assessment - 03/10/14 1338    Symptoms Pain in Rt. Shoulder, stiffness in neck, numb and tingly.     Limitations Sitting;Lifting;House hold activities;Walking;Standing   Currently in Pain? Yes   Pain Score 9    Pain Location Shoulder   Pain Orientation Right   Pain Descriptors / Indicators Burning;Tingling   Pain Type Chronic pain   Pain Onset More than a month ago  4-5 mos   Pain Frequency Constant   Aggravating Factors  activity   Pain Relieving Factors I don't know, pain meds do not help   Multiple Pain Sites Yes   Pain Score 8   Pain Type Chronic pain   Pain Location Back   Pain Orientation Right;Left   Pain Radiating Towards legs   Pain Frequency Constant   Pain Onset On-going           [facility] PT Assessment - 03/10/14 1343    Assessment   Medical Diagnosis Rt. shoulder pain   Onset Date 10/09/13   AROM   Cervical Flexion 40   Cervical Extension 15   Cervical - Right Side Bend 20   Cervical - Left Side Bend 20   Cervical - Right Rotation 30   Cervical - Left Rotation 40                  [facility] Adult PT Treatment/Exercise - 03/10/14 1457    Modalities   Modalities Traction   Traction   Type of Traction Cervical   Min (lbs) 10   Max (lbs) 16   Hold Time 60   Rest Time 15   Time 15   Neck Exercises: Stretches   Other Neck Stretches OA nod into towel   Other Neck Stretches C AROM given and demo x 1 each                PT Education - 03/10/14 1406    Education provided Yes   Education Details PT/POC, traction, C HEP   Person(s) Educated Patient   Methods Explanation;Demonstration;Handout   Comprehension Verbalized understanding;Returned demonstration;Need further instruction  Plan - 03/10/14 1410    Clinical Impression Statement Patient's symptoms are consistent with cervical involvement.  He was given HEP and C traction which.  He unfortunately does not have insurance coverage for this diagnosis.     Pt will benefit from skilled therapeutic intervention in order to improve on the following deficits Impaired flexibility;Pain;Improper body mechanics;Impaired sensation;Increased fascial restricitons;Decreased range of motion;Decreased strength;Postural dysfunction   Rehab Potential Good   PT Frequency One time visit   PT Treatment/Interventions Traction;Patient/family education;Therapeutic exercise   PT Next Visit Plan NA   PT Home Exercise Plan C ROM, posture   Consulted and Agree with Plan of Care Patient         Problem List Patient Active Problem List   Diagnosis Date Noted  . Laceration of finger of left hand 03/05/2014  . Myofascial pain syndrome 01/20/2014  . Anxiety and depression 11/04/2013  .  PTSD (post-traumatic stress disorder) 11/04/2013  . Neuropathic pain 09/12/2013  . Healthcare maintenance 09/12/2013  . OSA (obstructive sleep apnea) 03/26/2013  . HTN (hypertension) 01/01/2013  . Chest pain 11/16/2012  . Current smoker 09/05/2012  . Recurrent colitis due to Clostridium difficile 08/01/2012  . Chronic systolic heart failure 53/74/8270  . Polysubstance abuse 05/08/2012  . Alcohol abuse 05/01/2012  . Cocaine abuse 05/01/2012  . Acute systolic congestive heart failure, NYHA class 4 05/01/2012    Babbie Dondlinger 03/10/2014, 2:58 PM  [facility] [address] [place], [place], [address] Phone: [phone]   Fax:  [phone]

## 2014-03-12 ENCOUNTER — Telehealth: Payer: Self-pay | Admitting: *Deleted

## 2014-03-12 MED ORDER — TRAMADOL HCL 50 MG PO TABS: 50.0000 mg | ORAL_TABLET | Freq: Three times a day (TID) | ORAL | Status: DC | PRN

## 2014-03-12 MED ORDER — CYCLOBENZAPRINE HCL 10 MG PO TABS: 10.0000 mg | ORAL_TABLET | Freq: Three times a day (TID) | ORAL | Status: DC | PRN

## 2014-03-12 NOTE — Telephone Encounter (Signed)
I spoke with Legrand Como and gave him recommendations.  Called tramadol to pharmacy. I reordered the flexeril as ordered per Dr Letta Pate but did not transmit to pharmacy since Mr Holzhauer said he had flexeril from previous order at home and would not need to pick it up today. He has appt with Kirsteins on Monday 03/17/14

## 2014-03-12 NOTE — Telephone Encounter (Signed)
May use heat 30 minutes at a time as well as a muscle cream such as icy hot or BenGay May increase Flexeril to 10 mg 3 times per day May call in tramadol 50 mg 3 times a day #90 no refills

## 2014-03-12 NOTE — Telephone Encounter (Signed)
Left voicemail on home and mobile voicemails to return call to office to discuss Dr Letta Pate recommendations.

## 2014-03-12 NOTE — Telephone Encounter (Signed)
Patient called this morning and left message stating that on Monday he went to therapy and since then the pain in his neck is getting worse.  This morning he rates the pain 9/10.  He is asking if he should go to the ED.  Patient has appt  With you on Monday.  Is there anything we can do for him until he sees you on Monday afternoon.

## 2014-03-14 ENCOUNTER — Ambulatory Visit: Payer: Self-pay | Admitting: Physical Medicine & Rehabilitation

## 2014-03-17 ENCOUNTER — Ambulatory Visit (HOSPITAL_BASED_OUTPATIENT_CLINIC_OR_DEPARTMENT_OTHER): Payer: Medicaid Other | Admitting: Physical Medicine & Rehabilitation

## 2014-03-17 ENCOUNTER — Encounter: Payer: Medicaid Other | Attending: Physical Medicine & Rehabilitation

## 2014-03-17 ENCOUNTER — Encounter: Payer: Self-pay | Admitting: Physical Medicine & Rehabilitation

## 2014-03-17 VITALS — BP 146/101 | HR 83 | Resp 14

## 2014-03-17 DIAGNOSIS — M545 Low back pain, unspecified: Secondary | ICD-10-CM | POA: Insufficient documentation

## 2014-03-17 DIAGNOSIS — G8929 Other chronic pain: Secondary | ICD-10-CM | POA: Insufficient documentation

## 2014-03-17 DIAGNOSIS — M791 Myalgia: Secondary | ICD-10-CM | POA: Diagnosis not present

## 2014-03-17 DIAGNOSIS — M542 Cervicalgia: Secondary | ICD-10-CM | POA: Diagnosis not present

## 2014-03-17 DIAGNOSIS — M5010 Cervical disc disorder with radiculopathy, unspecified cervical region: Secondary | ICD-10-CM

## 2014-03-17 MED ORDER — METHYLPREDNISOLONE 4 MG PO KIT: PACK | ORAL | Status: DC

## 2014-03-17 NOTE — Patient Instructions (Signed)
The [facility] will call you to schedule the MRI  The new medicine called Dosepak will be anterior pharmacy and they can deliver it. Please follow the package directions  I will see you back to review your MRI and we can determine what else we need to do at that point

## 2014-03-17 NOTE — Progress Notes (Signed)
Subjective:    Patient ID: Charles Cervantes, male    DOB: 08-03-1960, 54 y.o.   MRN: [id number]  HPI 54 year old male with Primary complaint of neck pain and right shoulder pain.  He had a neck x-ray showing cervical spondylosis C5-6 C6-7 levels primarily. He has gone through outpatient physical therapy which has not been helpful. He has received muscle relaxers and gabapentin which were not helpful either.  Patient has a secondary complaint of lumbar pain. X-rays a lumbar spine showed some disc space narrowing at L5-S1. Other levels look normal  Patient does complain of some pain radiating from the back to his knees bilaterally  Pain Inventory Average Pain 8 Pain Right Now 8 My pain is burning, tingling and aching  In the last 24 hours, has pain interfered with the following? General activity 10 Relation with others 0 Enjoyment of life 4 What TIME of day is your pain at its worst? all Sleep (in general) Poor  Pain is worse with: bending, sitting and standing Pain improves with: nothing Relief from Meds: 0  Mobility walk without assistance how many minutes can you walk? 35 ability to climb steps?  yes do you drive?  no Do you have any goals in this area?  yes  Function not employed: date last employed .  Neuro/Psych weakness numbness tingling dizziness depression  Prior Studies Any changes since last visit?  no  Physicians involved in your care Any changes since last visit?  no   History reviewed. No pertinent family history. History   Social History  . Marital Status: Single    Spouse Name: N/A  . Number of Children: 0  . Years of Education: N/A   Occupational History  . unemployed    Social History Main Topics  . Smoking status: Current Some Day Smoker -- 0.20 packs/day for 25 years    Types: Cigarettes  . Smokeless tobacco: Never Used     Comment: 5-6 cigs per day  . Alcohol Use: No     Comment: stopped in april 2014  . Drug Use: No  . Sexual  Activity: No   Other Topics Concern  . None   Social History Narrative   Past Surgical History  Procedure Laterality Date  . Left second finger surgery    . Left and right heart catheterization with coronary angiogram N/A 11/20/2012    Procedure: LEFT AND RIGHT HEART CATHETERIZATION WITH CORONARY ANGIOGRAM;  Surgeon: Jolaine Artist, MD;  Location: [facility] CATH LAB;  Service: Cardiovascular;  Laterality: N/A;   Past Medical History  Diagnosis Date  . Hypertension   . Hyperlipidemia   . CHF (congestive heart failure)   . C. difficile diarrhea   . H/O blood clots 2012    in leg   There were no vitals taken for this visit.  Opioid Risk Score:   Fall Risk Score:    Review of Systems  Respiratory: Positive for apnea and shortness of breath.   Endocrine:       High blood sugar   Neurological: Positive for dizziness, weakness and numbness.       Tingling  Hematological: Bruises/bleeds easily.  Psychiatric/Behavioral: Positive for dysphoric mood.  All other systems reviewed and are negative.      Objective:   Physical Exam  Constitutional: He is oriented to person, place, and time. He appears well-developed and well-nourished.  HENT:  Head: Normocephalic and atraumatic.  Eyes: Conjunctivae and EOM are normal. Pupils are equal, round, and reactive  to light.  Neurological: He is alert and oriented to person, place, and time. He has normal strength. A sensory deficit is present. Coordination and gait normal.  Reflex Scores:      Tricep reflexes are 1+ on the right side and 1+ on the left side.      Bicep reflexes are 1+ on the right side and 2+ on the left side.      Brachioradialis reflexes are 1+ on the right side and 2+ on the left side.      Patellar reflexes are 2+ on the right side. Decreased pinprick sensation in C5 distribution  Motor strength is 5/5 bilateral deltoids, biceps, triceps, grip, hip flexor, knee extensor, ankle dorsiflexor and plantar flexor    Psychiatric: He has a normal mood and affect.  Nursing note and vitals reviewed.         Assessment & Plan:   1. Neck pain and right shoulder pain. Physical exam also significant for right C5 distribution numbness to pinprick as well as decreased right biceps and brachial radialis reflex compared to the left side. No gross motor deficits. Patient has failed physical therapy and other conservative care At this point I think it is reasonable to get a cervical MRI to further evaluate for possible foraminal stenosis versus central stenosis involving the right-sided cervical nerve roots.  We will initiate Medrol Dosepak Given history of polysubstance abuse would not prescribe narcotic analgesics on a chronic basis, patient is in agreement with this  2. Low back pain this is a secondary complaint. He does have some evidence of disc space narrowing at L5-S1 no clear-cut signs of sciatic involvement however, will monitor, no MRI recommended at this time

## 2014-03-18 ENCOUNTER — Encounter (HOSPITAL_COMMUNITY): Payer: Self-pay

## 2014-03-18 ENCOUNTER — Ambulatory Visit (HOSPITAL_COMMUNITY)
Admission: RE | Admit: 2014-03-18 | Discharge: 2014-03-18 | Disposition: A | Discharge status: Home / Self Care | Payer: Medicaid Other | Source: Ambulatory Visit | Attending: Internal Medicine | Admitting: Internal Medicine

## 2014-03-18 VITALS — BP 136/96 | HR 88 | Wt 192.0 lb

## 2014-03-18 DIAGNOSIS — F1910 Other psychoactive substance abuse, uncomplicated: Secondary | ICD-10-CM | POA: Diagnosis not present

## 2014-03-18 DIAGNOSIS — I5022 Chronic systolic (congestive) heart failure: Secondary | ICD-10-CM | POA: Insufficient documentation

## 2014-03-18 DIAGNOSIS — G4733 Obstructive sleep apnea (adult) (pediatric): Secondary | ICD-10-CM | POA: Diagnosis not present

## 2014-03-18 DIAGNOSIS — Z720 Tobacco use: Secondary | ICD-10-CM | POA: Diagnosis not present

## 2014-03-18 DIAGNOSIS — I10 Essential (primary) hypertension: Secondary | ICD-10-CM | POA: Diagnosis not present

## 2014-03-18 NOTE — Patient Instructions (Signed)
We have approved the [facility] tele monitoring program for you. They will be in contact with you to finish referral  Your physician has requested that you have an echocardiogram. Echocardiography is a painless test that uses sound waves to create images of your heart. It provides your doctor with information about the size and shape of your heart and how well your heart's chambers and valves are working. This procedure takes approximately one hour. There are no restrictions for this procedure.  Your physician recommends that you schedule a follow-up appointment with [facility] to establish and follow up

## 2014-03-18 NOTE — Progress Notes (Signed)
Patient ID: Charles Cervantes, male   DOB: February 02, 1960, 54 y.o.   MRN: [id number] Guide IT Study [facility]: [facility] Pulmonology: Dr Elsworth Soho  HPI: Charles Cervantes is a 54 yo AAM diagnosed with systolic heart failure in 04/2012 in [place] secondary to NICM (normal cors on cath), EF 20-25%. As well as polysubstance abuse (cocaine, tobacco and alcohol)--> quit in 04/2012.   He moved from [facility] and was admitted to [facility] on 05/01/12 with progressive dyspnea and orthopnea.  Echo on admit showed EF 20-25% with normal RV.  Discharge weight 147 pounds.  He also had NSVT while in house and we continued his Lifevest placed in [place].  Admitted To [facility] in 8/14 with HF. EF reported 50-55% on echo. On 10/2012 bedside echo in our HF Clinic EF 35-40%. In 11/2012 presented to clinic c/o recurrent CP and severe HF symptoms but seemed well compensated on exam R/L cath performed as below.  He returns for follow up. At last visit left angrily due to long wait. Several staff members felt physically threatened. Says past 2 months have been difficult. Remains SOB with mild activity. +bendopnea. BP remains high. [facility] trying to set up for home monitoring of BP. Says he is compliant with meds but out of carvedilol for several days. Also stopped taking furosemide as he wasn't swelling. Recently had MRI of R shoulder for chronic pain. No edema, orthopnea or PND. Arlyce Harman stopped in past due to noncompliance. Cutting back on cigarette. Denies cocaine or ETOH use x 23 months.  ECHO  04/2012 EF 20-25% 07/17/12 EF 30-35% 08/2012 EF 50-55% at [facility] ECHO 02/27/13 EF 35-40%  RHC/LHC  11/20/12  RA = 6  RV = 33/3/8  PA = 29/9 (18)  PCW = 8  Fick cardiac output/index = 6.4/3.2  PVR = 1.5 WU  FA sat = 95%  PA sat = 72%, 73%  SVC sat 72% Extensive calcification in proximal and mid LAD with only mild intraluminal stenosis with 20-30% stenosis.    CPX 2/15 FVC 2.90 (62%)  FEV1 2.22 (60%) FEV1/FVC 76%  Resting HR: 61 Peak HR: 111 (66% age  predicted max HR) BP rest: 146/90 BP peak: 168/86 Peak VO2: 22.6 (64.1% predicted peak VO2) VE/VCO2 slope: 29.4 OUES: 2.49 Peak RER: 0.90  Labs 06/29/12 Potassium 5.4 Creatinine 1.05 07/17/12 Dig Level 0.5 Potassium 4.0 Creatinine 0.95 Pro BNP 244 11/20/12 K 4.0 Creatinine 1.18 12/18/12: K 4.7, Cr 0.96 05/16/13 K 4.5 Creatinine 0.91  ROS: All systems negative except as listed in HPI, PMH and Problem List.  Past Medical History  Diagnosis Date  . Hypertension   . Hyperlipidemia   . CHF (congestive heart failure)   . C. difficile diarrhea   . H/O blood clots 2012    in leg    Current Outpatient Prescriptions  Medication Sig Dispense Refill  . carvedilol (COREG) 6.25 MG tablet Take 1 tablet (6.25 mg total) by mouth 2 (two) times daily. 60 tablet 3  . cyclobenzaprine (FLEXERIL) 10 MG tablet Take 1 tablet (10 mg total) by mouth 3 (three) times daily as needed for muscle spasms. 90 tablet 1  . docusate sodium (COLACE) 100 MG capsule Take 1 capsule (100 mg total) by mouth 2 (two) times daily. 30 capsule 0  . furosemide (LASIX) 40 MG tablet Take 40 mg by mouth as needed.     . gabapentin (NEURONTIN) 100 MG capsule Take 6 capsules (600 mg total) by mouth 3 (three) times daily. 90 capsule 3  . losartan (  COZAAR) 100 MG tablet Take 1 tablet (100 mg total) by mouth daily. 30 tablet 3  . methylPREDNISolone (MEDROL DOSEPAK) 4 MG tablet follow package directions 21 tablet 0  . oxyCODONE-acetaminophen (PERCOCET) 5-325 MG per tablet Take 1 tablet by mouth every 6 (six) hours as needed for severe pain. 30 tablet 0  . risperiDONE (RISPERDAL) 1 MG tablet Take 1 mg by mouth at bedtime.    . traMADol (ULTRAM) 50 MG tablet Take 1 tablet (50 mg total) by mouth every 8 (eight) hours as needed. 90 tablet 0  . venlafaxine XR (EFFEXOR XR) 75 MG 24 hr capsule Take 1 pill by mouth daily for 1 week, then increase to 2 pills by mouth daily and maintain for 4 weeks. 60 capsule 2   No current facility-administered  medications for this encounter.    Filed Vitals:   03/18/14 1535  BP: 136/96  Pulse: 88  Weight: 192 lb (87.091 kg)  SpO2: 98%   PHYSICAL EXAM: General:  Well appearing. No resp difficulty HEENT: normal Neck: supple. JVP 5-6. Carotids 2+ bilaterally; no bruits. No lymphadenopathy or thryomegaly appreciated. Cor: PMI normal. Regular rate & rhythm. No rubs, or murmurs.  Lungs: clear Abdomen: soft, nontender, nondistention. No hepatosplenomegaly. No bruits or masses. Good bowel sounds. Extremities: no cyanosis, clubbing, rash, edema Neuro: alert & orientedx3, cranial nerves grossly intact. Moves all 4 extremities w/o difficulty. Affect pleasant.   ASSESSMENT & PLAN:  1) Chronic systolic HF: NICM, ECHO 07/4825 EF ~35-40% - NYHA II symptoms.  - EF and volume status seem good even off lasix. Symptoms continue to be out of proportion to objective findings and CPX test - Will repeat echo - Resume Continue carvedilol 6.25 mg twice a day -Continue  losartan - Will not restart spiro due to noncompliance and K 4.5 or greater at baseline.  -- Reinforced the need and importance of daily weights, a low sodium diet, and fluid restriction (less than 2 L a day). Instructed to call the HF clinic if weight increases more than 3 lbs overnight or 5 lbs in a week.   2) Polysubstance abuse - remains drug and ETOH free and attending [facility] meetings and group sessions weekly. 3) Current smoker - continues to smoke about 1-2 cigarettes a day. Encouraged stop.   4) HTN - Elevated but has not been taking his medications. Continue current regimen. Will not restart spironolactone due to noncompliance. Can add amlodipine as needed. Will approve Telemonitoring program through [facility]. 5) OSA- Per DrAlva. Not using CPAP. Encouraged to use nightly.  6) Encounter in clinic - Given the events of his last visit several staff members do not feel comfortable in his presence and I discussed with with him. I told him we will  need to discharge him from the HF Clinic and he will f/u with general cardiology. I told him that if his HF gets worse and needs [place] to see him in the hospital I would do so.   Glori Bickers, MD  3:38 PM

## 2014-04-01 ENCOUNTER — Telehealth: Payer: Self-pay | Admitting: *Deleted

## 2014-04-01 ENCOUNTER — Encounter: Payer: Self-pay | Admitting: Internal Medicine

## 2014-04-01 NOTE — Telephone Encounter (Addendum)
[facility] called because he is asking for something for claustrophobia in the MRI (it is scheduled for 04/19/14) I spoke with him and I am sure Dr Letta Pate will ok something but I noticed his return appt with Kirsteins is 04/18/14 and it makes more sense for him to be seen after the MRI.  I will have someone see if they can move the appt with Kirsteins to after the 16th of April.  He said the MRI said this was their first date available.

## 2014-04-02 ENCOUNTER — Encounter (HOSPITAL_COMMUNITY): Payer: Self-pay | Admitting: *Deleted

## 2014-04-02 ENCOUNTER — Emergency Department (HOSPITAL_COMMUNITY)
Admission: EM | Admit: 2014-04-02 | Discharge: 2014-04-02 | Disposition: A | Discharge status: Home / Self Care | Payer: Medicaid Other | Attending: Emergency Medicine | Admitting: Emergency Medicine

## 2014-04-02 DIAGNOSIS — L089 Local infection of the skin and subcutaneous tissue, unspecified: Secondary | ICD-10-CM | POA: Insufficient documentation

## 2014-04-02 DIAGNOSIS — Z8619 Personal history of other infectious and parasitic diseases: Secondary | ICD-10-CM | POA: Insufficient documentation

## 2014-04-02 DIAGNOSIS — I509 Heart failure, unspecified: Secondary | ICD-10-CM | POA: Insufficient documentation

## 2014-04-02 DIAGNOSIS — Z8639 Personal history of other endocrine, nutritional and metabolic disease: Secondary | ICD-10-CM | POA: Diagnosis not present

## 2014-04-02 DIAGNOSIS — Z720 Tobacco use: Secondary | ICD-10-CM | POA: Insufficient documentation

## 2014-04-02 DIAGNOSIS — I10 Essential (primary) hypertension: Secondary | ICD-10-CM | POA: Insufficient documentation

## 2014-04-02 DIAGNOSIS — Z79899 Other long term (current) drug therapy: Secondary | ICD-10-CM | POA: Diagnosis not present

## 2014-04-02 DIAGNOSIS — R21 Rash and other nonspecific skin eruption: Secondary | ICD-10-CM | POA: Diagnosis present

## 2014-04-02 MED ORDER — PREDNISONE 20 MG PO TABS: 40.0000 mg | ORAL_TABLET | Freq: Every day | ORAL | Status: DC

## 2014-04-02 MED ORDER — SULFAMETHOXAZOLE-TRIMETHOPRIM 800-160 MG PO TABS: 1.0000 | ORAL_TABLET | Freq: Two times a day (BID) | ORAL | Status: DC

## 2014-04-02 MED ORDER — DIPHENHYDRAMINE HCL 25 MG PO TABS: 25.0000 mg | ORAL_TABLET | Freq: Four times a day (QID) | ORAL | Status: DC | PRN

## 2014-04-02 MED ORDER — DIPHENHYDRAMINE HCL 25 MG PO CAPS
25.0000 mg | ORAL_CAPSULE | Freq: Once | ORAL | Status: AC
  Administered 2014-04-02: 25 mg via ORAL
  Filled 2014-04-02: qty 1

## 2014-04-02 MED ORDER — SULFAMETHOXAZOLE-TRIMETHOPRIM 800-160 MG PO TABS
1.0000 | ORAL_TABLET | Freq: Once | ORAL | Status: AC
  Administered 2014-04-02: 1 via ORAL
  Filled 2014-04-02: qty 1

## 2014-04-02 MED ORDER — PREDNISONE 20 MG PO TABS
60.0000 mg | ORAL_TABLET | Freq: Once | ORAL | Status: AC
  Administered 2014-04-02: 60 mg via ORAL
  Filled 2014-04-02: qty 3

## 2014-04-02 NOTE — ED Notes (Signed)
Pt states that he was painting in his house when he was bit by something. Pt states that he has irrigation to bilateral arms and lower back.

## 2014-04-02 NOTE — Discharge Instructions (Signed)
Take bactrim and prednisone as directed until gone. Take benadryl as needed for itching and swelling. Refer to attached documents for more information.

## 2014-04-02 NOTE — ED Provider Notes (Signed)
CSN: [id number]     Arrival date & time 04/02/14  1145 History  This chart was scribed for non-physician practitioner, Fredia Beets, working with Elnora Morrison, MD by Molli Posey, ED Scribe. This patient was seen in room TR09C/TR09C and the patient's care was started at 12:21 PM.  Chief Complaint  Patient presents with  . Insect Bite   HPI HPI Comments: Charles Cervantes is a 54 y.o. male with a history of HTN, hyperlipidemia, CHF and blood clots who presents to the Emergency Department complaining of a possible insect bite that occurred 8 days ago. Pt reports an associated irritation to his bilateral arms, right lower back, and the area around his right eye. He states that these affected areas itch as well. Pt reports that he has tried using calamine lotion and cortisone cream for 3 days which failed to provide relief to his symptoms. He reports no alleviating factors at this time.   Past Medical History  Diagnosis Date  . Hypertension   . Hyperlipidemia   . CHF (congestive heart failure)   . C. difficile diarrhea   . H/O blood clots 2012    in leg   Past Surgical History  Procedure Laterality Date  . Left second finger surgery    . Left and right heart catheterization with coronary angiogram N/A 11/20/2012    Procedure: LEFT AND RIGHT HEART CATHETERIZATION WITH CORONARY ANGIOGRAM;  Surgeon: Jolaine Artist, MD;  Location: [facility] CATH LAB;  Service: Cardiovascular;  Laterality: N/A;   No family history on file. History  Substance Use Topics  . Smoking status: Current Some Day Smoker -- 0.20 packs/day for 25 years    Types: Cigarettes  . Smokeless tobacco: Never Used     Comment: 5-6 cigs per day  . Alcohol Use: No     Comment: stopped in april 2014    Review of Systems  Skin: Positive for rash.  All other systems reviewed and are negative.   Allergies  Ace inhibitors  Home Medications   Prior to Admission medications   Medication Sig Start Date End Date Taking?  Authorizing Provider  carvedilol (COREG) 6.25 MG tablet Take 1 tablet (6.25 mg total) by mouth 2 (two) times daily. 03/07/14   Jolaine Artist, MD  cyclobenzaprine (FLEXERIL) 10 MG tablet Take 1 tablet (10 mg total) by mouth 3 (three) times daily as needed for muscle spasms. 03/12/14   Charlett Blake, MD  docusate sodium (COLACE) 100 MG capsule Take 1 capsule (100 mg total) by mouth 2 (two) times daily. 03/04/14   Leone Brand, MD  furosemide (LASIX) 40 MG tablet Take 40 mg by mouth as needed.  11/16/12   Jolaine Artist, MD  gabapentin (NEURONTIN) 100 MG capsule Take 6 capsules (600 mg total) by mouth 3 (three) times daily. 11/04/13   Vivi Barrack, MD  losartan (COZAAR) 100 MG tablet Take 1 tablet (100 mg total) by mouth daily. 04/26/13   Amy D Ninfa Meeker, NP  methylPREDNISolone (MEDROL DOSEPAK) 4 MG tablet follow package directions 03/17/14   Charlett Blake, MD  oxyCODONE-acetaminophen (PERCOCET) 5-325 MG per tablet Take 1 tablet by mouth every 6 (six) hours as needed for severe pain. 03/04/14   Leone Brand, MD  risperiDONE (RISPERDAL) 1 MG tablet Take 1 mg by mouth at bedtime.    Historical Provider, MD  traMADol (ULTRAM) 50 MG tablet Take 1 tablet (50 mg total) by mouth every 8 (eight) hours as needed. 03/12/14  Charlett Blake, MD  venlafaxine XR (EFFEXOR XR) 75 MG 24 hr capsule Take 1 pill by mouth daily for 1 week, then increase to 2 pills by mouth daily and maintain for 4 weeks. 11/04/13   Vivi Barrack, MD   BP 158/108 mmHg  Pulse 65  Temp(Src) 98 F (36.7 C)  Resp 16  Ht 6' 2.5" (1.892 m)  Wt 185 lb (83.915 kg)  BMI 23.44 kg/m2  SpO2 100% Physical Exam  Constitutional: He is oriented to person, place, and time. He appears well-developed and well-nourished.  HENT:  Head: Normocephalic and atraumatic.  Periorbital swelling of left eye.   Eyes: Right eye exhibits no discharge. Left eye exhibits no discharge.  Neck: Neck supple. No tracheal deviation present.   Cardiovascular: Normal rate.   Pulmonary/Chest: Effort normal. No respiratory distress.  Abdominal: Soft. He exhibits no distension.  Neurological: He is alert and oriented to person, place, and time.  Skin: Skin is warm and dry.  Scattered flesh colored papules with overlying excoriations of bilateral arms, back and neck.   Psychiatric: He has a normal mood and affect. His behavior is normal.  Nursing note and vitals reviewed.   ED Course  Procedures   DIAGNOSTIC STUDIES: Oxygen Saturation is 100% on RA, normal by my interpretation.    COORDINATION OF CARE: 12:25 PM Discussed treatment plan with pt at bedside and pt agreed to plan.   Labs Review Labs Reviewed - No data to display  Imaging Review No results found.   EKG Interpretation None      MDM   Final diagnoses:  Skin infection   12:53 PM Patient will be treated with bactrim, benadryl, and prednisone. Vitals stable and patient afebrile. Patient instructed to return with worsening or concerning symptoms.   I personally performed the services described in this documentation, which was scribed in my presence. The recorded information has been reviewed and is accurate.      Alvina Chou, PA-C 04/02/14 1255  Elnora Morrison, MD 04/02/14 [phone]

## 2014-04-02 NOTE — ED Notes (Signed)
Waiting for discharge instructions. 

## 2014-04-02 NOTE — ED Notes (Signed)
Declined W/C at D/C and was escorted to lobby by RN. 

## 2014-04-03 NOTE — Addendum Note (Signed)
Encounter addended by: Scarlette Calico, RN on: 04/03/2014 12:18 PM<BR>     Documentation filed: [employer], Orders

## 2014-04-07 MED ORDER — DIAZEPAM 10 MG PO TABS
10.0000 mg | ORAL_TABLET | Freq: Once | ORAL | Status: DC
Start: 2014-04-07 — End: 2014-08-26

## 2014-04-07 NOTE — Telephone Encounter (Signed)
Order placed and notified MR Brummitt.

## 2014-04-07 NOTE — Telephone Encounter (Signed)
May call in valium 10mg  take one tab prior to MRI

## 2014-04-18 ENCOUNTER — Ambulatory Visit (INDEPENDENT_AMBULATORY_CARE_PROVIDER_SITE_OTHER): Payer: Medicaid Other | Admitting: Family Medicine

## 2014-04-18 ENCOUNTER — Encounter: Payer: Self-pay | Admitting: Family Medicine

## 2014-04-18 ENCOUNTER — Ambulatory Visit: Payer: Medicaid Other | Admitting: Physical Medicine & Rehabilitation

## 2014-04-18 VITALS — BP 133/86 | HR 74 | Temp 97.5°F | Ht 72.5 in | Wt 194.6 lb

## 2014-04-18 DIAGNOSIS — Z79899 Other long term (current) drug therapy: Secondary | ICD-10-CM | POA: Diagnosis not present

## 2014-04-18 DIAGNOSIS — M2391 Unspecified internal derangement of right knee: Secondary | ICD-10-CM | POA: Diagnosis not present

## 2014-04-18 DIAGNOSIS — S61219D Laceration without foreign body of unspecified finger without damage to nail, subsequent encounter: Secondary | ICD-10-CM

## 2014-04-18 DIAGNOSIS — Z0000 Encounter for general adult medical examination without abnormal findings: Secondary | ICD-10-CM | POA: Diagnosis not present

## 2014-04-18 DIAGNOSIS — M25361 Other instability, right knee: Secondary | ICD-10-CM | POA: Diagnosis not present

## 2014-04-18 DIAGNOSIS — J449 Chronic obstructive pulmonary disease, unspecified: Secondary | ICD-10-CM | POA: Insufficient documentation

## 2014-04-18 LAB — CBC
HEMATOCRIT: 44.2 % (ref 39.0–52.0)
Hemoglobin: 14.7 g/dL (ref 13.0–17.0)
MCH: 29.3 pg (ref 26.0–34.0)
MCHC: 33.3 g/dL (ref 30.0–36.0)
MCV: 88 fL (ref 78.0–100.0)
MPV: 11.3 fL (ref 8.6–12.4)
Platelets: 259 10*3/uL (ref 150–400)
RBC: 5.02 MIL/uL (ref 4.22–5.81)
RDW: 14.1 % (ref 11.5–15.5)
WBC: 5.4 10*3/uL (ref 4.0–10.5)

## 2014-04-18 LAB — POCT GLYCOSYLATED HEMOGLOBIN (HGB A1C): HEMOGLOBIN A1C: 5.6

## 2014-04-18 MED ORDER — TIOTROPIUM BROMIDE MONOHYDRATE 18 MCG IN CAPS
18.0000 ug | ORAL_CAPSULE | Freq: Every morning | RESPIRATORY_TRACT | Status: DC
Start: 2014-04-18 — End: 2014-09-29

## 2014-04-18 MED ORDER — ASPIRIN 81 MG PO TABS: 81.0000 mg | ORAL_TABLET | Freq: Every day | ORAL | Status: DC

## 2014-04-18 MED ORDER — ALBUTEROL SULFATE HFA 108 (90 BASE) MCG/ACT IN AERS: 2.0000 | INHALATION_SPRAY | Freq: Four times a day (QID) | RESPIRATORY_TRACT | Status: DC | PRN

## 2014-04-18 NOTE — Progress Notes (Signed)
Charles Cervantes is a 54 y.o. male who presents to the [facility] today with a chief complaint of shortness of breath. His concerns today include:  HPI:  Shortness of breath. Patient endorses chronic shortness of breath since being diagnosed with congestive heart failure for the past 23 months. States that he now gets short of breath while walking a flight of stairs. Denies orthopnea, leg edema, or PND currently. He is seeing a cardiologist for his heart failure. States that his chest occasionally feels tight, but lasts for only a few moments. Is scheduled to see his cardiologist in 3 weeks. Currently taking lasix as needed with losartan and coreg daily.   Per chart review, patient with mild COPD on spirometry last year. Denies being prescribed treatment or inhalers in the past.   Right Leg Pain and Weakness Reports pain in his right knee and endorses a clicking sensation in his right leg for the past several months. States that the knee will buckle then give out. No recent trauma, though patient unsure if the knee was injured while he was in the [employer].   Left Finger Injury Patient injured 4th digit of left finger while putting together a grill 1 month ago. Was seen in the ED and had sutures placed. Has not had sutures removed yet. Still unable to flex DIP.  Right neck and shoulder pain Endorses continued pain in right neck and arm. Currently seeing pain clinic. Has MRI scheduled for tomorrow.   ROS: As per HPI, otherwise all systems reviewed and are negative.  Past Medical History - Reviewed and updated Patient Active Problem List   Diagnosis Date Noted  . COPD (chronic obstructive pulmonary disease) 04/18/2014  . Right knee buckling 04/18/2014  . Chronic low back pain 03/17/2014  . Laceration of finger of left hand 03/05/2014  . Myofascial pain syndrome 01/20/2014  . Anxiety and depression 11/04/2013  . PTSD (post-traumatic stress disorder) 11/04/2013  . Neuropathic pain 09/12/2013  .  Healthcare maintenance 09/12/2013  . OSA (obstructive sleep apnea) 03/26/2013  . HTN (hypertension) 01/01/2013  . Chest pain 11/16/2012  . Current smoker 09/05/2012  . Chronic systolic heart failure 69/79/4801  . Polysubstance abuse 05/08/2012  . Alcohol abuse 05/01/2012  . Cocaine abuse 05/01/2012    Medications- reviewed and updated Current Outpatient Prescriptions  Medication Sig Dispense Refill  . albuterol (PROVENTIL HFA;VENTOLIN HFA) 108 (90 BASE) MCG/ACT inhaler Inhale 2 puffs into the lungs every 6 (six) hours as needed for wheezing or shortness of breath. 1 Inhaler 2  . aspirin 81 MG tablet Take 1 tablet (81 mg total) by mouth daily. 30 tablet 3  . carvedilol (COREG) 6.25 MG tablet Take 1 tablet (6.25 mg total) by mouth 2 (two) times daily. 60 tablet 3  . cyclobenzaprine (FLEXERIL) 10 MG tablet Take 1 tablet (10 mg total) by mouth 3 (three) times daily as needed for muscle spasms. 90 tablet 1  . diazepam (VALIUM) 10 MG tablet Take 1 tablet (10 mg total) by mouth once. One hour Prior to MRI 1 tablet 0  . diphenhydrAMINE (BENADRYL) 25 MG tablet Take 1 tablet (25 mg total) by mouth every 6 (six) hours as needed for itching (Rash). 30 tablet 0  . docusate sodium (COLACE) 100 MG capsule Take 1 capsule (100 mg total) by mouth 2 (two) times daily. 30 capsule 0  . furosemide (LASIX) 40 MG tablet Take 40 mg by mouth as needed.     . gabapentin (NEURONTIN) 100 MG capsule Take 6  capsules (600 mg total) by mouth 3 (three) times daily. 90 capsule 3  . losartan (COZAAR) 100 MG tablet Take 1 tablet (100 mg total) by mouth daily. 30 tablet 3  . methylPREDNISolone (MEDROL DOSEPAK) 4 MG tablet follow package directions 21 tablet 0  . oxyCODONE-acetaminophen (PERCOCET) 5-325 MG per tablet Take 1 tablet by mouth every 6 (six) hours as needed for severe pain. 30 tablet 0  . predniSONE (DELTASONE) 20 MG tablet Take 2 tablets (40 mg total) by mouth daily. Take 40 mg by mouth daily for 3 days, then 20mg   by mouth daily for 3 days, then 10mg  daily for 3 days 12 tablet 0  . risperiDONE (RISPERDAL) 1 MG tablet Take 1 mg by mouth at bedtime.    . sulfamethoxazole-trimethoprim (SEPTRA DS) 800-160 MG per tablet Take 1 tablet by mouth every 12 (twelve) hours. 20 tablet 0  . tiotropium (SPIRIVA) 18 MCG inhalation capsule Place 1 capsule (18 mcg total) into inhaler and inhale every morning. 30 capsule 12  . traMADol (ULTRAM) 50 MG tablet Take 1 tablet (50 mg total) by mouth every 8 (eight) hours as needed. 90 tablet 0  . venlafaxine XR (EFFEXOR XR) 75 MG 24 hr capsule Take 1 pill by mouth daily for 1 week, then increase to 2 pills by mouth daily and maintain for 4 weeks. 60 capsule 2   No current facility-administered medications for this visit.    Objective: Physical Exam: BP 133/86 mmHg  Pulse 74  Temp(Src) 97.5 F (36.4 C) (Oral)  Ht 6' 0.5" (1.842 m)  Wt 194 lb 9 oz (88.253 kg)  BMI 26.01 kg/m2  Gen: NAD, resting comfortably CV: RRR with no murmurs appreciated Lungs: NWOB, diminished airflow, faint end expiratory wheezes in bases bilaterally, no crackles Abdomen: Normal bowel sounds present. Soft, Nontender, Nondistended. Ext: Right knee tender to palpation along joint line, limited ROM, no clicking elicited on passive flexion, no effusion or erythema. Left 4th digit with extended DIP joint, wound on volar aspect on intermediate phalanges with 2 sutures remaining. Unable to flex DIP joint.  Skin: warm, dry Neuro: grossly normal, no focal deficits.   Results for orders placed or performed in visit on 04/18/14 (from the past 72 hour(s))  POCT glycosylated hemoglobin (Hb A1C)     Status: None   Collection Time: 04/18/14  4:00 PM  Result Value Ref Range   Hemoglobin A1C 5.6      A/P: See problem list  COPD (chronic obstructive pulmonary disease) Shortness of breath likely a component of CHF and COPD. CHF seems to be relatively well controlled - euvolemic on exam, weight stable, and no  symptoms of volume overload. No chest pain. Will see cardiologist in 3 weeks for further management.  Had spirometry consistent with COPD last year. Has never received treatment. Will start spiriva and albuterol today. Will get updated PFTs soon.  Will follow up in 1-2 months.    Right knee buckling Concerning history for meniscal tear. Physical exam with limited ROM and joint line tenderness, though not clicking appreciated on physical exam. Given this concern and concern for injury to flexor tendon of hand, will refer to orthopedics.    Laceration of finger of left hand 2 sutures removed today. Wound appears well healed, though patient is unable to flex DIP join on left 4th digit. Concern for flexor tendon injury. Will refer to orthopedics.    Healthcare maintenance Started patient on ASA 81mg  daily  Will check A1c and lipid panel today.  Encouraged smoking cessation.      Orders Placed This Encounter  Procedures  . CBC  . Basic metabolic panel  . Lipid Panel  . Ambulatory referral to Orthopedics    Referral Priority:  Routine    Referral Type:  Consultation    Referral Reason:  Specialty Services Required    Requested Specialty:  Orthopedic Surgery    Number of Visits Requested:  1  . POCT glycosylated hemoglobin (Hb A1C)    Meds ordered this encounter  Medications  . aspirin 81 MG tablet    Sig: Take 1 tablet (81 mg total) by mouth daily.    Dispense:  30 tablet    Refill:  3  . tiotropium (SPIRIVA) 18 MCG inhalation capsule    Sig: Place 1 capsule (18 mcg total) into inhaler and inhale every morning.    Dispense:  30 capsule    Refill:  12  . albuterol (PROVENTIL HFA;VENTOLIN HFA) 108 (90 BASE) MCG/ACT inhaler    Sig: Inhale 2 puffs into the lungs every 6 (six) hours as needed for wheezing or shortness of breath.    Dispense:  1 Inhaler    Refill:  [place]. Jerline Pain, [place] Resident PGY-1 04/18/2014 6:06 PM

## 2014-04-18 NOTE — Assessment & Plan Note (Signed)
Concerning history for meniscal tear. Physical exam with limited ROM and joint line tenderness, though not clicking appreciated on physical exam. Given this concern and concern for injury to flexor tendon of hand, will refer to orthopedics.

## 2014-04-18 NOTE — Patient Instructions (Signed)
Thanks for coming to clinic today. It was nice seeing you.  Please ask your pain doctor about your neck and leg pain.  For your shortness of breath, I believe you may have mild COPD. We will start you on a daily inhaled medication called spiriva. You can also use an albuterol inhaler as needed for shortness of breath.  For your right leg locking, I am concerned that you have a meniscal tear. We will refer you to orthopedics. You can also ask them about your hand injury there.  We will start you on daily baby aspirin today. This will help reduce your risk for hear attack and stroke.  We will also check your cholesterol and blood sugar levels today.  Please ask to schedule PFTs at the front desk. This will be done in our office. Please do not use any inhalers for 2 days prior to this appointment.   We will see you again in 1-2 months, or sooner as needed.

## 2014-04-18 NOTE — Assessment & Plan Note (Signed)
Started patient on ASA 81mg  daily  Will check A1c and lipid panel today.   Encouraged smoking cessation.

## 2014-04-18 NOTE — Assessment & Plan Note (Signed)
2 sutures removed today. Wound appears well healed, though patient is unable to flex DIP join on left 4th digit. Concern for flexor tendon injury. Will refer to orthopedics.

## 2014-04-18 NOTE — Assessment & Plan Note (Addendum)
Shortness of breath likely a component of CHF and COPD. CHF seems to be relatively well controlled - euvolemic on exam, weight stable, and no symptoms of volume overload. No chest pain. Will see cardiologist in 3 weeks for further management.  Had spirometry consistent with COPD last year. Has never received treatment. Will start spiriva and albuterol today. Will get updated PFTs soon.  Will follow up in 1-2 months.

## 2014-04-19 ENCOUNTER — Ambulatory Visit
Admission: RE | Admit: 2014-04-19 | Discharge: 2014-04-19 | Disposition: A | Discharge status: Home / Self Care | Payer: Medicaid Other | Source: Ambulatory Visit | Attending: Physical Medicine & Rehabilitation | Admitting: Physical Medicine & Rehabilitation

## 2014-04-19 DIAGNOSIS — M5010 Cervical disc disorder with radiculopathy, unspecified cervical region: Secondary | ICD-10-CM

## 2014-04-19 LAB — BASIC METABOLIC PANEL
BUN: 10 mg/dL (ref 6–23)
CO2: 26 meq/L (ref 19–32)
Calcium: 9.6 mg/dL (ref 8.4–10.5)
Chloride: 105 mEq/L (ref 96–112)
Creat: 1 mg/dL (ref 0.50–1.35)
Glucose, Bld: 75 mg/dL (ref 70–99)
POTASSIUM: 5 meq/L (ref 3.5–5.3)
Sodium: 139 mEq/L (ref 135–145)

## 2014-04-19 LAB — LIPID PANEL
CHOL/HDL RATIO: 4.2 ratio
Cholesterol: 195 mg/dL (ref 0–200)
HDL: 46 mg/dL (ref >=40)
LDL Cholesterol: 106 mg/dL — ABNORMAL HIGH (ref 0–99)
TRIGLYCERIDES: 215 mg/dL — AB (ref <150)
VLDL: 43 mg/dL — AB (ref 0–40)

## 2014-04-21 ENCOUNTER — Telehealth: Payer: Self-pay | Admitting: *Deleted

## 2014-04-21 ENCOUNTER — Encounter: Payer: Self-pay | Admitting: Physical Medicine & Rehabilitation

## 2014-04-21 ENCOUNTER — Encounter: Payer: Medicaid Other | Attending: Physical Medicine & Rehabilitation

## 2014-04-21 ENCOUNTER — Ambulatory Visit (HOSPITAL_BASED_OUTPATIENT_CLINIC_OR_DEPARTMENT_OTHER): Payer: Medicaid Other | Admitting: Physical Medicine & Rehabilitation

## 2014-04-21 ENCOUNTER — Encounter: Payer: Self-pay | Admitting: Family Medicine

## 2014-04-21 VITALS — BP 128/95 | HR 62 | Resp 14

## 2014-04-21 DIAGNOSIS — M792 Neuralgia and neuritis, unspecified: Secondary | ICD-10-CM | POA: Diagnosis not present

## 2014-04-21 DIAGNOSIS — G8929 Other chronic pain: Secondary | ICD-10-CM | POA: Insufficient documentation

## 2014-04-21 DIAGNOSIS — M4802 Spinal stenosis, cervical region: Secondary | ICD-10-CM | POA: Diagnosis not present

## 2014-04-21 DIAGNOSIS — M5010 Cervical disc disorder with radiculopathy, unspecified cervical region: Secondary | ICD-10-CM | POA: Diagnosis not present

## 2014-04-21 DIAGNOSIS — M542 Cervicalgia: Secondary | ICD-10-CM | POA: Diagnosis present

## 2014-04-21 DIAGNOSIS — M791 Myalgia: Secondary | ICD-10-CM | POA: Diagnosis not present

## 2014-04-21 DIAGNOSIS — M545 Low back pain: Secondary | ICD-10-CM | POA: Diagnosis not present

## 2014-04-21 MED ORDER — MELOXICAM 7.5 MG PO TABS: 7.5000 mg | ORAL_TABLET | Freq: Every day | ORAL | Status: DC

## 2014-04-21 MED ORDER — TIZANIDINE HCL 4 MG PO TABS: 4.0000 mg | ORAL_TABLET | Freq: Three times a day (TID) | ORAL | Status: DC | PRN

## 2014-04-21 MED ORDER — GABAPENTIN 300 MG PO CAPS: 300.0000 mg | ORAL_CAPSULE | Freq: Three times a day (TID) | ORAL | Status: DC

## 2014-04-21 NOTE — Progress Notes (Signed)
Results reviewed. Letter sent. Would benefit from initiation of statin - will discuss at next appointment.  Algis Greenhouse. Jerline Pain, [place] Resident PGY-1 04/21/2014 4:49 PM

## 2014-04-21 NOTE — Patient Instructions (Signed)
New dose of gabapentin to help with pain going from neck to shoulder Start meloxicam for anti infalmmatory and pain

## 2014-04-21 NOTE — Progress Notes (Signed)
Subjective:    Patient ID: Charles Cervantes, male    DOB: 1960/07/13, 54 y.o.   MRN: [id number]  HPI Patient returns today to review MRI performed on 04/19/2014. He continues have neck pain as well as right shoulder pain. He's had no falls or any other trauma. He has tried several medications without much success.  Not much Pain relief in the right shoulder with medication management or with physical therapy   Pain Inventory Average Pain 8 Pain Right Now 8 My pain is burning, tingling and aching  In the last 24 hours, has pain interfered with the following? General activity 9 Relation with others 6 Enjoyment of life 8 What TIME of day is your pain at its worst? daytime Sleep (in general) Poor  Pain is worse with: walking, sitting and standing Pain improves with: medication Relief from Meds: 2  Mobility walk without assistance how many minutes can you walk? 10 ability to climb steps?  yes do you drive?  no needs help with transfers Do you have any goals in this area?  yes  Function not employed: date last employed .  Neuro/Psych weakness tremor tingling trouble walking spasms confusion depression  Prior Studies Any changes since last visit?  no CT/MRI  Physicians involved in your care Any changes since last visit?  no   History reviewed. No pertinent family history. History   Social History  . Marital Status: Single    Spouse Name: N/A  . Number of Children: 0  . Years of Education: N/A   Occupational History  . unemployed    Social History Main Topics  . Smoking status: Current Some Day Smoker -- 0.20 packs/day for 25 years    Types: Cigarettes  . Smokeless tobacco: Never Used     Comment: 5-6 cigs per day  . Alcohol Use: No     Comment: stopped in april 2014  . Drug Use: No  . Sexual Activity: No   Other Topics Concern  . None   Social History Narrative   Past Surgical History  Procedure Laterality Date  . Left second finger surgery     . Left and right heart catheterization with coronary angiogram N/A 11/20/2012    Procedure: LEFT AND RIGHT HEART CATHETERIZATION WITH CORONARY ANGIOGRAM;  Surgeon: Jolaine Artist, MD;  Location: [facility] CATH LAB;  Service: Cardiovascular;  Laterality: N/A;   Past Medical History  Diagnosis Date  . Hypertension   . Hyperlipidemia   . CHF (congestive heart failure)   . C. difficile diarrhea   . H/O blood clots 2012    in leg   BP 128/95 mmHg  Pulse 62  Resp 14  SpO2 97%  Opioid Risk Score:   Fall Risk Score: Moderate Fall Risk (6-13 points)`1  Depression screen PHQ 2/9  Depression screen [facility] 2/9 04/18/2014 03/04/2014  Decreased Interest 1 0  Down, Depressed, Hopeless 1 0  PHQ - 2 Score 2 0  Altered sleeping 1 -  Tired, decreased energy 1 -  Change in appetite 0 -  Feeling bad or failure about yourself  1 -  Trouble concentrating 1 -  Moving slowly or fidgety/restless 0 -  Suicidal thoughts 0 -  PHQ-9 Score 6 -  Difficult doing work/chores Somewhat difficult -     Review of Systems  Respiratory: Positive for apnea, cough and shortness of breath.   Endocrine:       High blood sugar  Musculoskeletal: Positive for gait problem.  Neurological: Positive  for tremors and weakness.       Tingling Spasms   Psychiatric/Behavioral: Positive for confusion and dysphoric mood.  All other systems reviewed and are negative.      Objective:   Physical Exam  Constitutional: He is oriented to person, place, and time. He appears well-developed and well-nourished.  HENT:  Head: Normocephalic and atraumatic.  Neurological: He is alert and oriented to person, place, and time. He has normal strength. Gait normal.  Reflex Scores:      Tricep reflexes are 0 on the right side and 1+ on the left side.      Bicep reflexes are 0 on the right side and 1+ on the left side.      Brachioradialis reflexes are 0 on the right side and 1+ on the left side. Decreased sensation to pinprick in the  right C4-C5 C6-C7 dermatomal distribution  Motor strength is 5/5 bilateral deltoid, biceps, triceps, grip, wrist extension  Psychiatric: He has a normal mood and affect.  Nursing note and vitals reviewed.         Assessment & Plan:  1. Cervical stenosis particularly C4-5 C5-6 on the right side with right C5-C6 radiculopathy, he has severe foraminal stenosis on the right side and moderate foraminal stenosis on the left side. This appears consistent with his MRI findings. Make referral to orthopedic spine surgery to eval for decompressive surgery Given failure of conservative care  Follow-up when necessary In the meantime have prescribed tizanidine 4 mg 3 times a day Mobic 7.5 mg per day Will increase gabapentin to 300 mg 3 times a day  Avoid narcotic analgesics due to history of polysubstance abuse, he will need Narcotic analgesic post procedure but will defer to surgery on this

## 2014-04-21 NOTE — Telephone Encounter (Signed)
Charles Cervantes with [facility] called to request if she could take over pt's Venlafaxine.  She is managing his psych issues.  She is also requesting a copy of his lab work. Please give her a call if PCP agree to her taking over his psych management at [phone].  Derl Barrow, RN

## 2014-04-21 NOTE — Progress Notes (Signed)
I was preceptor the day of this visit.   

## 2014-04-22 NOTE — Telephone Encounter (Signed)
LM for Ms. Bealy to call back.  Per Dr. Jerline Pain he is ok with her taking over his medication. Will call patient to come and sign a release of information for [place] to fax labs to her. Mikaia Janvier,CMA

## 2014-04-22 NOTE — Telephone Encounter (Signed)
Spoke with patient and he is ok with her taking over medication.  Also he will come by the office to sign a release of information to fax labs over. Arvell Pulsifer,CMA

## 2014-04-22 NOTE — Telephone Encounter (Signed)
LM for Ms. Bealy to call back.  Labs printed and at my desk to fax once I hear from her with a number. Jazmin Hartsell,CMA

## 2014-05-01 ENCOUNTER — Telehealth: Payer: Self-pay | Admitting: *Deleted

## 2014-05-01 NOTE — Telephone Encounter (Signed)
Charles Cervantes called and his shoulder is giving him so much pain that he is having difficulty sleeping. Medications are not working.  He is in [place] right now with his aunt who is having open heart surgery so he is not available to come in if I were to offer at this time.  He has been referred to [facility] for surgery but he has not heard from that office.  I have check and the referral has been received.  I have given him the number for [facility] so that he can call to follow up.  He will call us back if he is in further need of assistance.

## 2014-05-08 ENCOUNTER — Ambulatory Visit (INDEPENDENT_AMBULATORY_CARE_PROVIDER_SITE_OTHER): Payer: Medicaid Other | Admitting: Internal Medicine

## 2014-05-08 ENCOUNTER — Encounter: Payer: Self-pay | Admitting: Internal Medicine

## 2014-05-08 VITALS — BP 146/98 | HR 80 | Ht 73.0 in | Wt 197.0 lb

## 2014-05-08 DIAGNOSIS — R0602 Shortness of breath: Secondary | ICD-10-CM | POA: Diagnosis not present

## 2014-05-08 DIAGNOSIS — F1410 Cocaine abuse, uncomplicated: Secondary | ICD-10-CM | POA: Diagnosis not present

## 2014-05-08 DIAGNOSIS — Z79899 Other long term (current) drug therapy: Secondary | ICD-10-CM

## 2014-05-08 DIAGNOSIS — I5022 Chronic systolic (congestive) heart failure: Secondary | ICD-10-CM

## 2014-05-08 DIAGNOSIS — J438 Other emphysema: Secondary | ICD-10-CM

## 2014-05-08 DIAGNOSIS — F1910 Other psychoactive substance abuse, uncomplicated: Secondary | ICD-10-CM

## 2014-05-08 DIAGNOSIS — I10 Essential (primary) hypertension: Secondary | ICD-10-CM

## 2014-05-08 DIAGNOSIS — F4310 Post-traumatic stress disorder, unspecified: Secondary | ICD-10-CM

## 2014-05-08 MED ORDER — FUROSEMIDE 40 MG PO TABS: 40.0000 mg | ORAL_TABLET | Freq: Every day | ORAL | Status: DC

## 2014-05-08 NOTE — Progress Notes (Signed)
Patient ID: Charles Cervantes, male   DOB: [birth year], 54 y.o.   MRN: [id number] Guide IT Study [facility]CP: [facility] Pulmonology: Dr Elsworth Soho  HPI: Charles Cervantes is a 54 yo AAM diagnosed with systolic heart failure in 04/2012 in [place] secondary to NICM (normal cors on cath), EF 20-25%. As well as polysubstance abuse (cocaine, tobacco and alcohol)--> quit in 04/2012.   He moved from [facility] and was admitted to [facility] on 05/01/12 with progressive dyspnea and orthopnea.  Echo on admit showed EF 20-25% with normal RV.  Discharge weight 147 pounds.  He also had NSVT while in house and we continued his Lifevest placed in [place].  Admitted To [facility] in 8/14 with HF. EF reported 50-55% on echo. On 10/2012 bedside echo in our HF Clinic EF 35-40%. In 11/2012 presented to clinic c/o recurrent CP and severe HF symptoms but seemed well compensated on exam R/L cath performed as below.  I saw Charles Cervantes today in the office in follow-up. He was last seen in March in the heart failure clinic but was dismissed from that clinic due to confrontational behavior with the staff.  He is noted some progressive shortness of breath over the past couple of months. He does have Lasix which she takes only as needed, but is noted some increased abdominal girth and weight gain. He denies any leg swelling and is not more short of breath laying down but has had some progressive shortness of breath. He does have COPD and was supposed to start on Spiriva, but cannot figure out how to administer the medications to himself. He says that he wants to get home health care arranged and that he would need me to sign off on that.  ECHO  04/2012 EF 20-25% 07/17/12 EF 30-35% 08/2012 EF 50-55% at [facility] ECHO 02/27/13 EF 35-40%  RHC/LHC  11/20/12  RA = 6  RV = 33/3/8  PA = 29/9 (18)  PCW = 8  Fick cardiac output/index = 6.4/3.2  PVR = 1.5 WU  FA sat = 95%  PA sat = 72%, 73%  SVC sat 72% Extensive calcification in proximal and mid LAD with  only mild intraluminal stenosis with 20-30% stenosis.    CPX 2/15 FVC 2.90 (62%)  FEV1 2.22 (60%) FEV1/FVC 76%  Resting HR: 61 Peak HR: 111 (66% age predicted max HR) BP rest: 146/90 BP peak: 168/86 Peak VO2: 22.6 (64.1% predicted peak VO2) VE/VCO2 slope: 29.4 OUES: 2.49 Peak RER: 0.90  Labs 06/29/12 Potassium 5.4 Creatinine 1.05 07/17/12 Dig Level 0.5 Potassium 4.0 Creatinine 0.95 Pro BNP 244 11/20/12 K 4.0 Creatinine 1.18 12/18/12: K 4.7, Cr 0.96 05/16/13 K 4.5 Creatinine 0.91  ROS: All systems negative except as listed in HPI, PMH and Problem List.  Past Medical History  Diagnosis Date  . Hypertension   . Hyperlipidemia   . CHF (congestive heart failure)   . C. difficile diarrhea   . H/O blood clots 2012    in leg    Current Outpatient Prescriptions  Medication Sig Dispense Refill  . albuterol (PROVENTIL HFA;VENTOLIN HFA) 108 (90 BASE) MCG/ACT inhaler Inhale 2 puffs into the lungs every 6 (six) hours as needed for wheezing or shortness of breath. 1 Inhaler 2  . aspirin 81 MG tablet Take 1 tablet (81 mg total) by mouth daily. 30 tablet 3  . carvedilol (COREG) 6.25 MG tablet Take 1 tablet (6.25 mg total) by mouth 2 (two) times daily. 60 tablet 3  . cyclobenzaprine (FLEXERIL) 10  MG tablet Take 1 tablet (10 mg total) by mouth 3 (three) times daily as needed for muscle spasms. 90 tablet 1  . diazepam (VALIUM) 10 MG tablet Take 1 tablet (10 mg total) by mouth once. One hour Prior to MRI 1 tablet 0  . diphenhydrAMINE (BENADRYL) 25 MG tablet Take 1 tablet (25 mg total) by mouth every 6 (six) hours as needed for itching (Rash). 30 tablet 0  . docusate sodium (COLACE) 100 MG capsule Take 1 capsule (100 mg total) by mouth 2 (two) times daily. 30 capsule 0  . furosemide (LASIX) 40 MG tablet Take 1 tablet (40 mg total) by mouth daily. 30 tablet 6  . gabapentin (NEURONTIN) 300 MG capsule Take 1 capsule (300 mg total) by mouth 3 (three) times daily. 90 capsule 1  . losartan (COZAAR) 100  MG tablet Take 1 tablet (100 mg total) by mouth daily. 30 tablet 3  . meloxicam (MOBIC) 7.5 MG tablet Take 1 tablet (7.5 mg total) by mouth daily. 30 tablet 1  . methylPREDNISolone (MEDROL DOSEPAK) 4 MG tablet follow package directions 21 tablet 0  . oxyCODONE-acetaminophen (PERCOCET) 5-325 MG per tablet Take 1 tablet by mouth every 6 (six) hours as needed for severe pain. 30 tablet 0  . sulfamethoxazole-trimethoprim (SEPTRA DS) 800-160 MG per tablet Take 1 tablet by mouth every 12 (twelve) hours. 20 tablet 0  . tiotropium (SPIRIVA) 18 MCG inhalation capsule Place 1 capsule (18 mcg total) into inhaler and inhale every morning. 30 capsule 12  . tiZANidine (ZANAFLEX) 4 MG tablet Take 1 tablet (4 mg total) by mouth every 8 (eight) hours as needed for muscle spasms. 90 tablet 1  . traMADol (ULTRAM) 50 MG tablet Take 1 tablet (50 mg total) by mouth every 8 (eight) hours as needed. 90 tablet 0  . venlafaxine XR (EFFEXOR XR) 75 MG 24 hr capsule Take 1 pill by mouth daily for 1 week, then increase to 2 pills by mouth daily and maintain for 4 weeks. 60 capsule 2  . risperiDONE (RISPERDAL) 3 MG tablet Take 1 tablet by mouth at bedtime.  0   No current facility-administered medications for this visit.    Filed Vitals:   05/08/14 1127 05/08/14 1130  BP: 146/100 146/98  Pulse: 80   Height: 6\' 1"  (1.854 m)   Weight: 197 lb (89.359 kg)    PHYSICAL EXAM: General:  Well appearing. No resp difficulty HEENT: normal Neck: supple. JVP 8. Carotids 2+ bilaterally; no bruits. No lymphadenopathy or thryomegaly appreciated. Cor: PMI normal. Regular rate & rhythm. No rubs, or murmurs.  Lungs: clear Abdomen: soft, nontender, nondistention. No hepatosplenomegaly. No bruits or masses. Good bowel sounds. Extremities: no cyanosis, clubbing, rash, edema Neuro: alert & orientedx3, cranial nerves grossly intact. Moves all 4 extremities w/o difficulty. Affect pleasant.   ASSESSMENT:  Patient Active Problem List    Diagnosis Date Noted  . Spinal stenosis in cervical region 04/21/2014  . Cervical disc disorder with radiculopathy of cervical region 04/21/2014  . COPD (chronic obstructive pulmonary disease) 04/18/2014  . Right knee buckling 04/18/2014  . Chronic low back pain 03/17/2014  . Laceration of finger of left hand 03/05/2014  . Myofascial pain syndrome 01/20/2014  . Anxiety and depression 11/04/2013  . PTSD (post-traumatic stress disorder) 11/04/2013  . Neuropathic pain 09/12/2013  . Healthcare maintenance 09/12/2013  . OSA (obstructive sleep apnea) 03/26/2013  . HTN (hypertension) 01/01/2013  . Chest pain 11/16/2012  . Current smoker 09/05/2012  . Chronic systolic heart failure  05/08/2012  . Polysubstance abuse 05/08/2012  . Alcohol abuse 05/01/2012  . Cocaine abuse 05/01/2012   PLAN:  1.  He is more short of breath today by his report and does have elevated jugular veins. He's had some abdominal swelling and weight gain which could represent worsening heart failure. There is questionable compliance with his medications. I'm recommending that he restart Lasix 40 mg daily. Will check a CMET and BNP next week per his request as he has to come back and see [facility] orthopedics for ongoing neck pain problems. He declined getting blood work in the office today. Plan for close follow-up in one month.  Pixie Casino, MD, [facility] Attending Cardiologist [facility]  Nadean Corwin Hilty 1:26 PM

## 2014-05-08 NOTE — Patient Instructions (Signed)
Your physician has recommended you make the following change in your medication: TAKE furosemide (lasix) 40mg  once daily   Your physician recommends that you return for lab work in [employer] (week of May 9-13)  Your physician recommends that you schedule a follow-up appointment in: 1 month with Dr. Debara Pickett.

## 2014-05-16 ENCOUNTER — Telehealth: Payer: Self-pay | Admitting: *Deleted

## 2014-05-16 ENCOUNTER — Other Ambulatory Visit: Payer: Self-pay | Admitting: Physical Medicine & Rehabilitation

## 2014-05-16 ENCOUNTER — Encounter (HOSPITAL_COMMUNITY): Payer: Self-pay

## 2014-05-16 LAB — COMPREHENSIVE METABOLIC PANEL
ALT: 41 U/L (ref 0–53)
AST: 23 U/L (ref 0–37)
Albumin: 4 g/dL (ref 3.5–5.2)
Alkaline Phosphatase: 51 U/L (ref 39–117)
BILIRUBIN TOTAL: 0.4 mg/dL (ref 0.2–1.2)
BUN: 12 mg/dL (ref 6–23)
CHLORIDE: 103 meq/L (ref 96–112)
CO2: 25 mEq/L (ref 19–32)
Calcium: 9.4 mg/dL (ref 8.4–10.5)
Creat: 1.15 mg/dL (ref 0.50–1.35)
GLUCOSE: 96 mg/dL (ref 70–99)
Potassium: 4.6 mEq/L (ref 3.5–5.3)
SODIUM: 137 meq/L (ref 135–145)
Total Protein: 6.8 g/dL (ref 6.0–8.3)

## 2014-05-16 LAB — BRAIN NATRIURETIC PEPTIDE: Brain Natriuretic Peptide: 9.8 pg/mL (ref 0.0–100.0)

## 2014-05-16 NOTE — Telephone Encounter (Signed)
Faxed "chronic disease referral form" for patient to receive tele-monitoring w/[facility].   Plan of Care is for remote tele-monitoring for 90 days Criteria for Referral - New Alluwe Access II Medicair Parameters:  > diastolic BP 88-71  > systolic BP 95-974  > weight 190-210lbs  > HR 50-120 bmp  > SpO2 <92%  > weight gain 2lbs in 24 hrs Initial Order for CHF stage 3 w/HTN, COPD  Discontinue monitoring if patient is non-compliant  Results of tele-monitoring to be faxed monthly for MD review

## 2014-05-16 NOTE — Progress Notes (Signed)
Request sent for work restrictions and medical records.  Patient has been discharged from our services, notice sent back to [facility].  Charles Cervantes

## 2014-05-21 ENCOUNTER — Telehealth: Payer: Self-pay | Admitting: *Deleted

## 2014-05-21 ENCOUNTER — Telehealth: Payer: Self-pay | Admitting: Family Medicine

## 2014-05-21 NOTE — Telephone Encounter (Signed)
Faxed to [place] [facility] (phone: [phone]) health info on patient to assist in determining his eligibility for services to aid in vocational training - form completed by MD and faxed in along with last office visit note

## 2014-05-21 NOTE — Telephone Encounter (Signed)
Medication list faxed to [place] [facility] Denali Sharma,CMA

## 2014-05-21 NOTE — Telephone Encounter (Signed)
[facility]: needs a copy of pt current med list Please fax to [phone]

## 2014-06-06 ENCOUNTER — Ambulatory Visit: Payer: Self-pay | Admitting: Internal Medicine

## 2014-06-17 ENCOUNTER — Telehealth: Payer: Self-pay | Admitting: Internal Medicine

## 2014-06-17 NOTE — Telephone Encounter (Signed)
Anne from [employer] from [facility] called to report BPs  158/104, 154/103 when repeated.  No HR to report.  Pt having no symptoms or acute concerns. Has appt w/ PCP today, was advised by caller to have BP rechecked at physician's office.  Pt instructed to call if concerns.

## 2014-06-18 ENCOUNTER — Telehealth: Payer: Self-pay | Admitting: *Deleted

## 2014-06-18 DIAGNOSIS — I5022 Chronic systolic (congestive) heart failure: Secondary | ICD-10-CM

## 2014-06-18 DIAGNOSIS — I10 Essential (primary) hypertension: Secondary | ICD-10-CM

## 2014-06-18 DIAGNOSIS — Z01810 Encounter for preprocedural cardiovascular examination: Secondary | ICD-10-CM

## 2014-06-18 NOTE — Telephone Encounter (Signed)
Please call,concerning Charles Cervantes's blood pressure.

## 2014-06-18 NOTE — Telephone Encounter (Signed)
1. Type of surgery: TLIF L5-S1 2. Date of surgery: to be scheduled 3. Surgeon: Jewett not legible  4. Medications that need to be held & how long: none designated 5. Fax and/or Phone: (f) [phone] - Attn: Orson Slick   [phone]

## 2014-06-18 NOTE — Telephone Encounter (Signed)
LM for Lelon Frohlich to return call   Will route to [facility] clinical pool

## 2014-06-18 NOTE — Telephone Encounter (Signed)
Spoke with Lelon Frohlich - she reports patient's BP has been up (160s/100s-110s). She is unsure if patient is using home BP cuff correctly. She reports patient has NO complaints. There is a note regarding this from 6/14 that N. Rose, RN took a call about.   When asked if she was certain that patient is taking medications correctly she mentioned losartan - reported to Lelon Frohlich that this medication has not been refilled in EPIC since 04/2013. She will review notes when she gets back to her office and call back.   Delton See to ask for triage nurse as I will be unavailable.

## 2014-06-18 NOTE — Telephone Encounter (Signed)
Need him to get the echo I ordered so I can better assess his risk for surgery.   Dr. Lemmie Evens

## 2014-06-18 NOTE — Telephone Encounter (Signed)
There was no echo ordered at last visit. Should I order complete echo for pre-op clearance? Other indication?   Patient is scheduled to come back in July for office visit

## 2014-06-20 NOTE — Telephone Encounter (Signed)
Ann clarified that pt has Losartan on hand, was prescribed by PCP. Ann gave pt instructions on hypertensive urgency.  She thinks patient has not been routinely compliant with meds and that this is the core cause of his BP elevations. She states teaching done including stress reduction, setting time for taking medications, etc.  Lelon Frohlich states she will fax a report of patient's BP trends to our office.

## 2014-06-23 NOTE — Telephone Encounter (Signed)
Echocardiogram ordered - pre-operative clearance/chronic systolic HF/HTN Patient notified and reminded of OV in July   Staff message sent to [facility] to schedule echo

## 2014-06-25 ENCOUNTER — Telehealth: Payer: Self-pay | Admitting: Internal Medicine

## 2014-06-25 NOTE — Telephone Encounter (Signed)
Charles Cervantes called in stating that she needs to speak with a nurse in regards to the pt's BP. His diastolic number is running in the 100s. Please call  Thanks

## 2014-06-25 NOTE — Telephone Encounter (Signed)
Spoke to [employer] from [employer] She reporting patient's blood pressure is still elevated. She called last week as well with a report of increase blood pressure. She is sending a report with recordings- some numbers are 159/107,146/101,162/110 , 166/108 Heart rate in the 70's . Webb Silversmith states patient has not seen his Primary as reported on 06/17/14. Patient saw an Dr Dietrich Pates [facility]). Per Anne,patient state she is taking medication Cardiac-losartan 100 mg,coreg 6.25mg ,lasix 40 mg Patient received RX from Dr Haroldine Laws in 03/2014,per Webb Silversmith.  Trend report placed in Dr Lysbeth Penner box to review and Eliezer Lofts RN  Please Contact Ann and patient of instruction and medication changes

## 2014-06-27 ENCOUNTER — Other Ambulatory Visit (HOSPITAL_COMMUNITY): Payer: Self-pay

## 2014-06-30 NOTE — Telephone Encounter (Signed)
Has this been taken care of?

## 2014-06-30 NOTE — Telephone Encounter (Signed)
Lelon Frohlich is returning [employer]

## 2014-06-30 NOTE — Telephone Encounter (Signed)
Charles Cervantes for patient to call back to schedule appointment with Dr. Barbaraann Barthel with Webb Silversmith, RN - she reported BP readings below Informed Webb Silversmith, RN that I tried to contact patient to schedule him for an OV (Wednesday June 29th @ 0900?) Patient has echo scheduled 6/29  Informed Webb Silversmith, RN that I will be out of the office Tuesday June 28th and should she reach patient, she should advise him to contact office regarding appointment and ask for triage nurse or scheduling  Friday June 24 135/94 Today June 27 140/87

## 2014-07-01 NOTE — Telephone Encounter (Signed)
Follow up scheduled

## 2014-07-02 ENCOUNTER — Ambulatory Visit (HOSPITAL_COMMUNITY)
Admission: RE | Admit: 2014-07-02 | Discharge: 2014-07-02 | Disposition: A | Discharge status: Home / Self Care | Payer: Medicaid Other | Source: Ambulatory Visit | Attending: Internal Medicine | Admitting: Internal Medicine

## 2014-07-02 DIAGNOSIS — Z01810 Encounter for preprocedural cardiovascular examination: Secondary | ICD-10-CM | POA: Diagnosis not present

## 2014-07-02 DIAGNOSIS — I509 Heart failure, unspecified: Secondary | ICD-10-CM | POA: Diagnosis present

## 2014-07-02 DIAGNOSIS — Z720 Tobacco use: Secondary | ICD-10-CM | POA: Diagnosis not present

## 2014-07-02 DIAGNOSIS — I10 Essential (primary) hypertension: Secondary | ICD-10-CM | POA: Diagnosis not present

## 2014-07-02 DIAGNOSIS — I5022 Chronic systolic (congestive) heart failure: Secondary | ICD-10-CM | POA: Diagnosis not present

## 2014-07-03 ENCOUNTER — Telehealth: Payer: Self-pay | Admitting: Internal Medicine

## 2014-07-03 NOTE — Telephone Encounter (Signed)
Ann called in stating that she went by and saw the pt today and his BP was 170/107. She wanted to get his BP readings from yesterday when he was in to have his Echo done. Please f/u with her   Thanks

## 2014-07-04 NOTE — Telephone Encounter (Signed)
Spoke with Lelon Frohlich. She reports she has not gotten a BP reading from patient today. Informed Ann that patient's BP was not checked during echo and is not routinely checked during this test. She will try to contact patient today. Informed Ann that patient never called back regarding appointment when I was going to try to get him in to see MD sooner. Informed Ann that patient is scheduled for end of this month.

## 2014-07-14 ENCOUNTER — Other Ambulatory Visit (HOSPITAL_COMMUNITY): Payer: Self-pay | Admitting: *Deleted

## 2014-07-14 MED ORDER — CARVEDILOL 6.25 MG PO TABS: 6.2500 mg | ORAL_TABLET | Freq: Two times a day (BID) | ORAL | Status: DC

## 2014-07-14 NOTE — Telephone Encounter (Signed)
Patient cleared for TLIF L5-S1 Should hold aspirin 7 days prior to surgery  Clearance letter composed and routed via EPIC to [employer].

## 2014-07-18 ENCOUNTER — Encounter: Payer: Self-pay | Admitting: Family Medicine

## 2014-07-29 ENCOUNTER — Ambulatory Visit: Payer: Self-pay | Admitting: Internal Medicine

## 2014-07-31 ENCOUNTER — Telehealth: Payer: Self-pay | Admitting: Internal Medicine

## 2014-07-31 MED ORDER — FUROSEMIDE 40 MG PO TABS: 40.0000 mg | ORAL_TABLET | Freq: Every day | ORAL | Status: DC

## 2014-07-31 MED ORDER — LOSARTAN POTASSIUM 100 MG PO TABS: 100.0000 mg | ORAL_TABLET | Freq: Every day | ORAL | Status: DC

## 2014-07-31 NOTE — Telephone Encounter (Signed)
Nurse from [facility] called:  Noted pt BP reading of 144/97 - instructed to alert [place] if diastolic over 90  Noted 4lb weight gain from yesterday - weight today 189. He is, per her understanding, under target weight (190-210 lbs?) - I cannot locate these parameters.  He is c/o of some dyspnea but no more than usual w/ chronic issues.  He is not having any edema, orthopnea.  Missed his losartan dose this AM.  Refilled losartan to preferred pharmacy, advised to take this evening and resume tomorrow at regular time.  Pt agreeable to plan. Pt denies acute concerns. Instructed to call if new problems. O/w will see for OV w/ Dr. Debara Pickett on 8/12.

## 2014-08-01 ENCOUNTER — Other Ambulatory Visit: Payer: Self-pay | Admitting: *Deleted

## 2014-08-06 ENCOUNTER — Telehealth: Payer: Self-pay | Admitting: Internal Medicine

## 2014-08-06 MED ORDER — LOSARTAN POTASSIUM 100 MG PO TABS: 100.0000 mg | ORAL_TABLET | Freq: Every day | ORAL | Status: DC

## 2014-08-06 NOTE — Telephone Encounter (Signed)
Received a call from Bryson Dames with [facility].She stated patient needed losartan refill.Refill sent to pharmacy.

## 2014-08-07 ENCOUNTER — Telehealth: Payer: Self-pay | Admitting: *Deleted

## 2014-08-07 NOTE — Telephone Encounter (Signed)
Submitted PA for losartan 100mg  to [facility] [employer] via [email or web address]

## 2014-08-14 ENCOUNTER — Other Ambulatory Visit: Payer: Self-pay | Admitting: *Deleted

## 2014-08-14 MED ORDER — FUROSEMIDE 40 MG PO TABS: 40.0000 mg | ORAL_TABLET | Freq: Every day | ORAL | Status: DC

## 2014-08-15 ENCOUNTER — Ambulatory Visit (INDEPENDENT_AMBULATORY_CARE_PROVIDER_SITE_OTHER): Payer: Medicaid Other | Admitting: Internal Medicine

## 2014-08-15 ENCOUNTER — Encounter: Payer: Self-pay | Admitting: Internal Medicine

## 2014-08-15 VITALS — BP 132/90 | HR 76 | Ht 73.5 in | Wt 189.2 lb

## 2014-08-15 DIAGNOSIS — F1010 Alcohol abuse, uncomplicated: Secondary | ICD-10-CM | POA: Diagnosis not present

## 2014-08-15 DIAGNOSIS — Z720 Tobacco use: Secondary | ICD-10-CM

## 2014-08-15 DIAGNOSIS — F17200 Nicotine dependence, unspecified, uncomplicated: Secondary | ICD-10-CM

## 2014-08-15 DIAGNOSIS — I10 Essential (primary) hypertension: Secondary | ICD-10-CM | POA: Diagnosis not present

## 2014-08-15 DIAGNOSIS — J438 Other emphysema: Secondary | ICD-10-CM | POA: Diagnosis not present

## 2014-08-15 DIAGNOSIS — M545 Low back pain, unspecified: Secondary | ICD-10-CM

## 2014-08-15 DIAGNOSIS — F1910 Other psychoactive substance abuse, uncomplicated: Secondary | ICD-10-CM

## 2014-08-15 DIAGNOSIS — I5022 Chronic systolic (congestive) heart failure: Secondary | ICD-10-CM

## 2014-08-15 DIAGNOSIS — G8929 Other chronic pain: Secondary | ICD-10-CM

## 2014-08-15 NOTE — Patient Instructions (Signed)
Follow up in 6 months 

## 2014-08-15 NOTE — Progress Notes (Signed)
Patient ID: Charles Cervantes, male   DOB: November 02, 1960, 54 y.o.   MRN: [id number] Guide IT Study [facility]CP: [facility]. Pulmonology: Dr Elsworth Soho  CC: Out of my blood pressure medicine  HPI: Charles Cervantes is a 54 yo AAM diagnosed with systolic heart failure in 04/2012 in [place] secondary to NICM (normal cors on cath), EF 20-25%. As well as polysubstance abuse (cocaine, tobacco and alcohol)--> quit in 04/2012.   He moved from [facility] and was admitted to [facility] on 05/01/12 with progressive dyspnea and orthopnea.  Echo on admit showed EF 20-25% with normal RV.  Discharge weight 147 pounds.  He also had NSVT while in house and we continued his Lifevest placed in [place].  Admitted To [facility] in 8/14 with HF. EF reported 50-55% on echo. On 10/2012 bedside echo in our HF Clinic EF 35-40%. In 11/2012 presented to clinic c/o recurrent CP and severe HF symptoms but seemed well compensated on exam R/L cath performed as below.  I saw Charles Cervantes today in the office in follow-up. He was last seen in March in the heart failure clinic but was dismissed from that clinic due to confrontational behavior with the staff.  He is noted some progressive shortness of breath over the past couple of months. He does have Lasix which she takes only as needed, but is noted some increased abdominal girth and weight gain. He denies any leg swelling and is not more short of breath laying down but has had some progressive shortness of breath. He does have COPD and was supposed to start on Spiriva, but cannot figure out how to administer the medications to himself. He says that he wants to get home health care arranged and that he would need me to sign off on that.  ECHO  04/2012 EF 20-25% 07/17/12 EF 30-35% 08/2012 EF 50-55% at [facility] ECHO 02/27/13 EF 35-40%  RHC/LHC  11/20/12  RA = 6  RV = 33/3/8  PA = 29/9 (18)  PCW = 8  Fick cardiac output/index = 6.4/3.2  PVR = 1.5 WU  FA sat = 95%  PA sat = 72%, 73%  SVC sat 72% Extensive  calcification in proximal and mid LAD with only mild intraluminal stenosis with 20-30% stenosis.    CPX 2/15 FVC 2.90 (62%)  FEV1 2.22 (60%) FEV1/FVC 76%  Resting HR: 61 Peak HR: 111 (66% age predicted max HR) BP rest: 146/90 BP peak: 168/86 Peak VO2: 22.6 (64.1% predicted peak VO2) VE/VCO2 slope: 29.4 OUES: 2.49 Peak RER: 0.90  Labs 06/29/12 Potassium 5.4 Creatinine 1.05 07/17/12 Dig Level 0.5 Potassium 4.0 Creatinine 0.95 Pro BNP 244 11/20/12 K 4.0 Creatinine 1.18 12/18/12: K 4.7, Cr 0.96 05/16/13 K 4.5 Creatinine 0.91  Charles Cervantes returns today for follow-up. We repeated his echocardiogram which shows a normalized EF of 55-60%. He reports his breathing has improved. He was asked to take Lasix on a daily basis but is not clear if that is currently what he is doing. He also has run out of losartan and we did get prior authorization for that. This could explain why his blood pressure is elevated today. Medication noncompliance is clearly an issue.   ROS: All systems negative except as listed in HPI, PMH and Problem List.  Past Medical History  Diagnosis Date  . Hypertension   . Hyperlipidemia   . CHF (congestive heart failure)   . C. difficile diarrhea   . H/O blood clots 2012    in leg  Current Outpatient Prescriptions  Medication Sig Dispense Refill  . albuterol (PROVENTIL HFA;VENTOLIN HFA) 108 (90 BASE) MCG/ACT inhaler Inhale 2 puffs into the lungs every 6 (six) hours as needed for wheezing or shortness of breath. 1 Inhaler 2  . aspirin 81 MG tablet Take 1 tablet (81 mg total) by mouth daily. 30 tablet 3  . carvedilol (COREG) 6.25 MG tablet Take 1 tablet (6.25 mg total) by mouth 2 (two) times daily. 60 tablet 3  . cyclobenzaprine (FLEXERIL) 10 MG tablet Take 1 tablet (10 mg total) by mouth 3 (three) times daily as needed for muscle spasms. 90 tablet 1  . diazepam (VALIUM) 10 MG tablet Take 1 tablet (10 mg total) by mouth once. One hour Prior to MRI 1 tablet 0  .  diphenhydrAMINE (BENADRYL) 25 MG tablet Take 1 tablet (25 mg total) by mouth every 6 (six) hours as needed for itching (Rash). 30 tablet 0  . docusate sodium (COLACE) 100 MG capsule Take 1 capsule (100 mg total) by mouth 2 (two) times daily. 30 capsule 0  . furosemide (LASIX) 40 MG tablet Take 1 tablet (40 mg total) by mouth daily. 30 tablet 4  . gabapentin (NEURONTIN) 300 MG capsule Take 1 capsule (300 mg total) by mouth 3 (three) times daily. 90 capsule 1  . meloxicam (MOBIC) 7.5 MG tablet Take 1 tablet (7.5 mg total) by mouth daily. 30 tablet 1  . methylPREDNISolone (MEDROL DOSEPAK) 4 MG tablet follow package directions 21 tablet 0  . oxyCODONE-acetaminophen (PERCOCET) 5-325 MG per tablet Take 1 tablet by mouth every 6 (six) hours as needed for severe pain. 30 tablet 0  . risperiDONE (RISPERDAL) 3 MG tablet Take 1 tablet by mouth at bedtime.  0  . sulfamethoxazole-trimethoprim (SEPTRA DS) 800-160 MG per tablet Take 1 tablet by mouth every 12 (twelve) hours. 20 tablet 0  . tiotropium (SPIRIVA) 18 MCG inhalation capsule Place 1 capsule (18 mcg total) into inhaler and inhale every morning. 30 capsule 12  . tiZANidine (ZANAFLEX) 4 MG tablet Take 1 tablet (4 mg total) by mouth every 8 (eight) hours as needed for muscle spasms. 90 tablet 1  . traMADol (ULTRAM) 50 MG tablet Take 1 tablet (50 mg total) by mouth every 8 (eight) hours as needed. 90 tablet 0  . venlafaxine XR (EFFEXOR XR) 75 MG 24 hr capsule Take 1 pill by mouth daily for 1 week, then increase to 2 pills by mouth daily and maintain for 4 weeks. 60 capsule 2  . losartan (COZAAR) 100 MG tablet Take 1 tablet (100 mg total) by mouth daily. (Patient not taking: Reported on 08/15/2014) 30 tablet 6   No current facility-administered medications for this visit.    Filed Vitals:   08/15/14 1458  BP: 132/90  Pulse: 76  Height: 6' 1.5" (1.867 m)  Weight: 189 lb 3.2 oz (85.821 kg)   PHYSICAL EXAM: Deferred   ASSESSMENT:  Patient Active  Problem List   Diagnosis Date Noted  . Spinal stenosis in cervical region 04/21/2014  . Cervical disc disorder with radiculopathy of cervical region 04/21/2014  . COPD (chronic obstructive pulmonary disease) 04/18/2014  . Right knee buckling 04/18/2014  . Chronic low back pain 03/17/2014  . Laceration of finger of left hand 03/05/2014  . Myofascial pain syndrome 01/20/2014  . Anxiety and depression 11/04/2013  . PTSD (post-traumatic stress disorder) 11/04/2013  . Neuropathic pain 09/12/2013  . Healthcare maintenance 09/12/2013  . OSA (obstructive sleep apnea) 03/26/2013  . HTN (hypertension) 01/01/2013  .  Chest pain 11/16/2012  . Current smoker 09/05/2012  . Chronic systolic heart failure 09/64/3838  . Polysubstance abuse 05/08/2012  . Alcohol abuse 05/01/2012  . Cocaine abuse 05/01/2012   PLAN:  1.  Charles Cervantes had normalization of his EF on the echo. His blood pressure remains elevated. We've provided prior authorization for losartan he just needs to get the prescription filled. I encouraged him to start taking this. I believe he be at low to intermediate risk for surgery, but now a lower risk that his EF has normalized. Most likely the change in EF is related to substance abuse. It's also related to noncompliance. Plan to see him back in 6 months.  Pixie Casino, MD, [facility] Attending Cardiologist [facility] 3:07 PM

## 2014-08-15 NOTE — Telephone Encounter (Signed)
Faxed PA for losartan to [employer]

## 2014-08-20 ENCOUNTER — Telehealth: Payer: Self-pay | Admitting: Internal Medicine

## 2014-08-20 NOTE — Telephone Encounter (Signed)
Spoke with Kara Pacer, RN. She called to say she is sending over report on this patient - BP trends. She states patient told her he was able to pick up losartan from pharmacy yesterday. She requested last OV note be faxed to her at [phone] (this was done).

## 2014-08-21 ENCOUNTER — Encounter: Payer: Self-pay | Admitting: *Deleted

## 2014-08-21 DIAGNOSIS — Z006 Encounter for examination for normal comparison and control in clinical research program: Secondary | ICD-10-CM

## 2014-08-21 NOTE — Progress Notes (Signed)
STATUS FIRST Informed Consent      Subject Name: Charles Cervantes  Subject met inclusion and exclusion criteria.The informed consent form, study requirements and expectations were reviewed with the subject and questions and concerns were addressed prior to the signing of the consent form. The subject verbalized understanding of the trail requirements. The subject agreed to participate in the [place] study and signed the informed consent at 12:22 on 08-21-14.The informed consent was obtained prior to performance of any protocol-specific procedures for the subject. A copy of the signed informed consent was given to the subject and a copy was placed in the subjects's medical record.    [place] Lynda Wanninger 08/21/14 12:22

## 2014-08-25 ENCOUNTER — Other Ambulatory Visit: Payer: Self-pay | Admitting: Physical Medicine & Rehabilitation

## 2014-08-26 ENCOUNTER — Encounter (HOSPITAL_COMMUNITY): Payer: Self-pay

## 2014-08-26 ENCOUNTER — Encounter (HOSPITAL_COMMUNITY)
Admission: RE | Admit: 2014-08-26 | Discharge: 2014-08-26 | Disposition: A | Discharge status: Home / Self Care | Payer: Medicaid Other | Source: Ambulatory Visit | Attending: Orthopedic Surgery | Admitting: Orthopedic Surgery

## 2014-08-26 DIAGNOSIS — Z01812 Encounter for preprocedural laboratory examination: Secondary | ICD-10-CM | POA: Insufficient documentation

## 2014-08-26 DIAGNOSIS — Z79899 Other long term (current) drug therapy: Secondary | ICD-10-CM | POA: Diagnosis not present

## 2014-08-26 DIAGNOSIS — I10 Essential (primary) hypertension: Secondary | ICD-10-CM | POA: Insufficient documentation

## 2014-08-26 DIAGNOSIS — I509 Heart failure, unspecified: Secondary | ICD-10-CM | POA: Diagnosis not present

## 2014-08-26 DIAGNOSIS — Z01818 Encounter for other preprocedural examination: Secondary | ICD-10-CM | POA: Insufficient documentation

## 2014-08-26 DIAGNOSIS — M79606 Pain in leg, unspecified: Secondary | ICD-10-CM | POA: Insufficient documentation

## 2014-08-26 DIAGNOSIS — Z7982 Long term (current) use of aspirin: Secondary | ICD-10-CM | POA: Insufficient documentation

## 2014-08-26 DIAGNOSIS — E785 Hyperlipidemia, unspecified: Secondary | ICD-10-CM | POA: Insufficient documentation

## 2014-08-26 DIAGNOSIS — F329 Major depressive disorder, single episode, unspecified: Secondary | ICD-10-CM | POA: Insufficient documentation

## 2014-08-26 DIAGNOSIS — Z0183 Encounter for blood typing: Secondary | ICD-10-CM | POA: Insufficient documentation

## 2014-08-26 DIAGNOSIS — G4733 Obstructive sleep apnea (adult) (pediatric): Secondary | ICD-10-CM | POA: Insufficient documentation

## 2014-08-26 DIAGNOSIS — J449 Chronic obstructive pulmonary disease, unspecified: Secondary | ICD-10-CM | POA: Diagnosis not present

## 2014-08-26 DIAGNOSIS — M549 Dorsalgia, unspecified: Secondary | ICD-10-CM | POA: Insufficient documentation

## 2014-08-26 HISTORY — DX: Headache: R51

## 2014-08-26 HISTORY — DX: Other chronic pain: G89.29

## 2014-08-26 HISTORY — DX: Other muscle spasm: M62.838

## 2014-08-26 HISTORY — DX: Sleep apnea, unspecified: G47.30

## 2014-08-26 HISTORY — DX: Depression, unspecified: F32.A

## 2014-08-26 HISTORY — DX: Dizziness and giddiness: R42

## 2014-08-26 HISTORY — DX: Headache, unspecified: R51.9

## 2014-08-26 HISTORY — DX: Reserved for inherently not codable concepts without codable children: IMO0001

## 2014-08-26 HISTORY — DX: Chronic obstructive pulmonary disease, unspecified: J44.9

## 2014-08-26 HISTORY — DX: Constipation, unspecified: K59.00

## 2014-08-26 HISTORY — DX: Weakness: R53.1

## 2014-08-26 HISTORY — DX: Major depressive disorder, single episode, unspecified: F32.9

## 2014-08-26 HISTORY — DX: Dorsalgia, unspecified: M54.9

## 2014-08-26 LAB — TYPE AND SCREEN
ABO/RH(D): A POS
Antibody Screen: NEGATIVE

## 2014-08-26 LAB — BASIC METABOLIC PANEL
ANION GAP: 5 (ref 5–15)
BUN: 8 mg/dL (ref 6–20)
CALCIUM: 9.3 mg/dL (ref 8.9–10.3)
CO2: 27 mmol/L (ref 22–32)
CREATININE: 1.26 mg/dL — AB (ref 0.61–1.24)
Chloride: 103 mmol/L (ref 101–111)
GFR calc non Af Amer: >60 mL/min (ref >60)
Glucose, Bld: 91 mg/dL (ref 65–99)
Potassium: 4.1 mmol/L (ref 3.5–5.1)
Sodium: 135 mmol/L (ref 135–145)

## 2014-08-26 LAB — CBC
HCT: 43.2 % (ref 39.0–52.0)
HEMOGLOBIN: 14.9 g/dL (ref 13.0–17.0)
MCH: 30 pg (ref 26.0–34.0)
MCHC: 34.5 g/dL (ref 30.0–36.0)
MCV: 87.1 fL (ref 78.0–100.0)
Platelets: 227 10*3/uL (ref 150–400)
RBC: 4.96 MIL/uL (ref 4.22–5.81)
RDW: 13.5 % (ref 11.5–15.5)
WBC: 5.5 10*3/uL (ref 4.0–10.5)

## 2014-08-26 LAB — ABO/RH: ABO/RH(D): A POS

## 2014-08-26 LAB — SURGICAL PCR SCREEN
MRSA, PCR: NEGATIVE
STAPHYLOCOCCUS AUREUS: NEGATIVE

## 2014-08-26 NOTE — Progress Notes (Addendum)
Cardiologist is Dr.Hilty last visit in epic from 08-15-14  Multiple echo reports in epic with most recent in 06/2014  Stress test in epic from 2015  EKG in epic from 05-08-14  CXR denies having one in the past yr  Medical Md is Stark  Heart cath in epic from 2014

## 2014-08-26 NOTE — Pre-Procedure Instructions (Signed)
Charles Cervantes  08/26/2014      [facility] - [place] ([place]), [place] [address] [place] [address] Phone: [phone] Fax: [phone]  [place], [place] - [address] [place] [address] Phone: [phone] Fax: [phone]  [facility] [address] [place], [place] - [address] [place] [address] Phone: [phone] Fax: [phone]  [facility] [place], [place] - [address] [place] [address] Phone: [phone] Fax: [phone]  [facility] [place], [place] [address] Phone: [phone] Fax: [phone]  [facility]- Charles Cervantes, [address] [place] [address] Phone: [phone] Fax: [phone]    Your procedure is scheduled on Wed, Aug 31 @ 8:30 AM  Report to [facility] at 6:30 AM.  Call this number if you have problems the morning of surgery:  [phone]   Remember:  Do not eat food or drink liquids after midnight.  Take these medicines the morning of surgery with A SIP OF WATER Albuterol<Bring Your Inhaler With You>,Coreg(Carvedilol),Valium(Diazepam),Gabapentin(Neurontin),Pain Pill(if needed),Septra(Sulfamethoxazole),Spiriva(Tiotropium),and Effexor(Venlafaxine)               Stop taking your Mobic and Aspirin along with any Vitamins or Herbal Medications. No Goody's,BC's,Aleve,Ibuprofen,or Fish Oil.    Do not wear jewelry.  Do not wear lotions, powders, or colognes.  You may wear deodorant.  Men may shave face and neck.  Do not bring valuables to the hospital.  [facility] is not responsible for any belongings or valuables.  Contacts, dentures or bridgework may not be worn into  surgery.  Leave your suitcase in the car.  After surgery it may be brought to your room.  For patients admitted to the hospital, discharge time will be determined by your treatment team.  Patients discharged the day of surgery will not be allowed to drive home.    Special instructions:  Charles Cervantes - Preparing for Surgery  Before surgery, you can play an important role.  Because skin is not sterile, your skin needs to be as free of germs as possible.  You can reduce the number of germs on you skin by washing with CHG (chlorahexidine gluconate) soap before surgery.  CHG is an antiseptic cleaner which kills germs and bonds with the skin to continue killing germs even after washing.  Please DO NOT use if you have an allergy to CHG or antibacterial soaps.  If your skin becomes reddened/irritated stop using the CHG and inform your nurse when you arrive at Short Stay.  Do not shave (including legs and underarms) for at least 48 hours prior to the first CHG shower.  You may shave your face.  Please follow these instructions carefully:   1.  Shower with CHG Soap the night before surgery and the                                morning of Surgery.  2.  If you choose to wash your hair, wash your hair first as usual with your       normal shampoo.  3.  After you shampoo, rinse your hair and body thoroughly to remove the  Shampoo.  4.  Use CHG as you would any other liquid soap.  You can apply chg directly       to the skin and wash gently with scrungie or a clean washcloth.  5.  Apply the CHG Soap to your body ONLY FROM THE NECK DOWN.        Do not use on open wounds or open sores.  Avoid contact with your eyes,       ears, mouth and genitals (private parts).  Wash genitals (private parts)       with your normal soap.  6.  Wash thoroughly, paying special attention to the area where your surgery        will be performed.  7.  Thoroughly rinse your body with warm water from the neck  down.  8.  DO NOT shower/wash with your normal soap after using and rinsing off       the CHG Soap.  9.  Pat yourself dry with a clean towel.            10.  Wear clean pajamas.            11.  Place clean sheets on your bed the night of your first shower and do not        sleep with pets.  Day of Surgery  Do not apply any lotions/deoderants the morning of surgery.  Please wear clean clothes to the hospital/surgery center.    Please read over the following fact sheets that you were given. Pain Booklet, Coughing and Deep Breathing, Blood Transfusion Information, MRSA Information and Surgical Site Infection Prevention

## 2014-08-27 NOTE — Telephone Encounter (Addendum)
Spoke with pharmacy staff. Medication went thru 08/18/14.

## 2014-08-28 ENCOUNTER — Encounter (HOSPITAL_COMMUNITY): Payer: Self-pay

## 2014-08-28 NOTE — Progress Notes (Signed)
Anesthesia Chart Review: Patient is a 54 year old male scheduled for L5-S1 decompression and fusion on 09/03/14 by Dr. Rolena Infante.  History includes smoking, HLD, HTN, CHF '14, "blood clots" in leg (DVT?) '12, SOB, COPD, OSA (was on CPAP as of 08/2013), chronic back pain, depression. Polysubstance abuse (cocaine, ETOH), quit in 04/2012. PCP is Dr. Dimas Chyle with [facility]. Pulmonologist is Dr. Elsworth Soho.   He was initially diagnosed with non-ischemic cardiomyopathy in [place], [place] in early 2014. He was discharged home with a life vest and instructed to follow-up with cardiology once he moved to [facility]. He presented to the [facility] ED in 04/2012 with acute systolic CHF (after med non-compliance) and was established with [name] with [facility]; however, he was discharged from that practice in 03/2014 due to confrontational behavior with staff. [name] said he would see him in the hospital if needed, but was otherwise referring him to general cardiology. Primary cardiologist is now Dr. Debara Pickett.   Meds include albuterol, ASA 81 mg, Coreg, Flexeril, Lasix, gabapentin, losartan, risperidone, Spiriva, Effexor SR.  Dr. Debara Pickett saw patient on 08/15/14 with plan stating, "I believe he be at low to intermediate risk for surgery, but now a lower risk that his EF has normalized. Most likely the change in EF is related to substance abuse. It's also related to noncompliance. Plan to see him back in 6 months." Okay to hold ASA 7 days prior to surgery.  Echo 07/02/14 - Left ventricle: The cavity size was normal. There was mild concentric hypertrophy. Systolic function was normal. The estimated ejection fraction was in the range of 55% to 60%. Wall motion was normal; there were no regional wall motion abnormalities. Doppler parameters are consistent with abnormal left ventricular relaxation (grade 1 diastolic dysfunction). - Left atrium: The atrium was mildly dilated. (EF had  normalized since his previous echo 02/27/13 showing EF 35-40% with diffuse hypokinesis.)   CPX 02/27/13 FVC 2.90 (62%)  FEV1 2.22 (60%) FEV1/FVC 76%  Resting HR: 61 Peak HR: 111 (66% age predicted max HR) BP rest: 146/90 BP peak: 168/86 Peak VO2: 22.6 (64.1% predicted peak VO2) VE/VCO2 slope: 29.4 OUES: 2.49 Peak RER: 0.90Conclusion: Evaluation of the the exercise test with gas exchange is limited due to the submaximal aerobic exercise, but the patient's functional capacity appears to be normal (or at worst mildly Reduced.) The low HR and normal O2pulse suggests a slow chronotropic response. There was no other signs of a circulatory limitation. There were no signs of a ventilatory limitation despite mild COPD on resting spirometry.  RHC/LHC11/18/14  RA = 6  RV = 33/3/8  PA = 29/9 (18)  PCW = 8  Fick cardiac output/index = 6.4/3.2  PVR = 1.5 WU  FA sat = 95%  PA sat = 72%, 73%  SVC sat 72% Extensive calcification in proximal and mid LAD with only mild intraluminal stenosis with 20-30% stenosis.   05/08/14 EKG: NSR, voltage criteria for LVH, T wave abnormality, consider inferolateral ischemia. Inferolateral T wave abnormality present since at least 06/2012.   05/02/13 Sleep Study: IMPRESSION :  1. moderate to severe obstructive sleep apnea with hypopneas causing sleep fragmentation and mild oxygen desaturation.  2. No evidence of cardiac arrhythmias,periodic limb movements or behavioral disturbance during sleep.  3. Sleep efficiency was poor due to prolonged sleep latency RECOMMENDATION:  1. Treatment options for this degree of sleep disordered breathing include weight loss and CPAP therapy. Due to cardiac issues would suggest an  in lab CPAP titration. 2. Patient should be cautioned against driving when sleepy  3. They should be asked to avoid medications with sedative side effects   03/26/13 PFTs: FVC 2.43 (52%), FEV1 1.74 (48%), FEV1/FVC 72% (91%), FEF25-75% 1.22 (30%).  Moderately severe restriction. Smoking cessation encouraged.  Preoperative labs noted. A1C 04/18/14 was 5.6.   Patient has been cleared with intermediate risk by his cardiologist. EF has recently recovered. He does have COPD with ongoing smoking. He is not on home O2. He is on Spiriva and albuterol. Further evaluation on the day of surgery by his surgeon and anesthesiologist to ensure no acute cardiopulmonary or CHF issues. If stable, then I would anticipate that he could proceed as planned.  [facility] Short Stay Center/Anesthesiology Phone [phone] 08/28/2014 4:21 PM

## 2014-09-02 MED ORDER — CEFAZOLIN SODIUM-DEXTROSE 2-3 GM-% IV SOLR
2.0000 g | INTRAVENOUS | Status: AC
  Administered 2014-09-03: 2 g via INTRAVENOUS
  Filled 2014-09-02: qty 50

## 2014-09-03 ENCOUNTER — Inpatient Hospital Stay (HOSPITAL_COMMUNITY): Payer: Medicaid Other

## 2014-09-03 ENCOUNTER — Encounter (HOSPITAL_COMMUNITY)
Admission: RE | Disposition: A | Discharge status: Home / Self Care | Payer: Medicaid Other | Source: Ambulatory Visit | Attending: Orthopedic Surgery

## 2014-09-03 ENCOUNTER — Inpatient Hospital Stay (HOSPITAL_COMMUNITY): Payer: Medicaid Other | Admitting: Vascular Surgery

## 2014-09-03 ENCOUNTER — Inpatient Hospital Stay (HOSPITAL_COMMUNITY)
Admission: RE | Admit: 2014-09-03 | Discharge: 2014-09-06 | DRG: 460 | Disposition: A | Discharge status: Home / Self Care | Payer: Medicaid Other | Source: Ambulatory Visit | Attending: Orthopedic Surgery | Admitting: Orthopedic Surgery

## 2014-09-03 ENCOUNTER — Inpatient Hospital Stay (HOSPITAL_COMMUNITY): Payer: Medicaid Other | Admitting: Certified Registered Nurse Anesthetist

## 2014-09-03 ENCOUNTER — Encounter (HOSPITAL_COMMUNITY): Payer: Self-pay | Admitting: *Deleted

## 2014-09-03 DIAGNOSIS — M545 Low back pain, unspecified: Secondary | ICD-10-CM | POA: Diagnosis present

## 2014-09-03 DIAGNOSIS — G4730 Sleep apnea, unspecified: Secondary | ICD-10-CM | POA: Diagnosis present

## 2014-09-03 DIAGNOSIS — F17210 Nicotine dependence, cigarettes, uncomplicated: Secondary | ICD-10-CM | POA: Diagnosis present

## 2014-09-03 DIAGNOSIS — Z981 Arthrodesis status: Secondary | ICD-10-CM

## 2014-09-03 DIAGNOSIS — E780 Pure hypercholesterolemia: Secondary | ICD-10-CM | POA: Diagnosis present

## 2014-09-03 DIAGNOSIS — M549 Dorsalgia, unspecified: Secondary | ICD-10-CM | POA: Diagnosis present

## 2014-09-03 DIAGNOSIS — J449 Chronic obstructive pulmonary disease, unspecified: Secondary | ICD-10-CM | POA: Diagnosis present

## 2014-09-03 DIAGNOSIS — M5136 Other intervertebral disc degeneration, lumbar region: Secondary | ICD-10-CM

## 2014-09-03 DIAGNOSIS — M5117 Intervertebral disc disorders with radiculopathy, lumbosacral region: Secondary | ICD-10-CM | POA: Diagnosis present

## 2014-09-03 SURGERY — POSTERIOR LUMBAR FUSION 1 LEVEL
Anesthesia: General

## 2014-09-03 MED ORDER — HYDROMORPHONE HCL 1 MG/ML IJ SOLN
INTRAMUSCULAR | Status: AC
  Filled 2014-09-03: qty 1

## 2014-09-03 MED ORDER — GLYCOPYRROLATE 0.2 MG/ML IJ SOLN
INTRAMUSCULAR | Status: AC
  Filled 2014-09-03: qty 1

## 2014-09-03 MED ORDER — PROPOFOL 10 MG/ML IV BOLUS
INTRAVENOUS | Status: AC
  Filled 2014-09-03: qty 20

## 2014-09-03 MED ORDER — PROPOFOL 10 MG/ML IV BOLUS
INTRAVENOUS | Status: DC | PRN
  Administered 2014-09-03 (×2): 100 mg via INTRAVENOUS
  Administered 2014-09-03: 200 mg via INTRAVENOUS

## 2014-09-03 MED ORDER — FUROSEMIDE 40 MG PO TABS
40.0000 mg | ORAL_TABLET | Freq: Every day | ORAL | Status: DC
  Administered 2014-09-04 – 2014-09-06 (×3): 40 mg via ORAL
  Filled 2014-09-03 (×3): qty 1

## 2014-09-03 MED ORDER — ONDANSETRON HCL 4 MG/2ML IJ SOLN: 4.0000 mg | INTRAMUSCULAR | Status: DC | PRN

## 2014-09-03 MED ORDER — HYDROMORPHONE HCL 1 MG/ML IJ SOLN
0.5000 mg | Freq: Once | INTRAMUSCULAR | Status: AC
  Administered 2014-09-03: 0.5 mg via INTRAVENOUS

## 2014-09-03 MED ORDER — MEPERIDINE HCL 25 MG/ML IJ SOLN: 6.2500 mg | INTRAMUSCULAR | Status: DC | PRN

## 2014-09-03 MED ORDER — FENTANYL CITRATE (PF) 250 MCG/5ML IJ SOLN
INTRAMUSCULAR | Status: AC
  Filled 2014-09-03: qty 5

## 2014-09-03 MED ORDER — PHENYLEPHRINE 40 MCG/ML (10ML) SYRINGE FOR IV PUSH (FOR BLOOD PRESSURE SUPPORT)
PREFILLED_SYRINGE | INTRAVENOUS | Status: AC
  Filled 2014-09-03: qty 10

## 2014-09-03 MED ORDER — SODIUM CHLORIDE 0.9 % IJ SOLN
3.0000 mL | Freq: Two times a day (BID) | INTRAMUSCULAR | Status: DC
  Administered 2014-09-03 – 2014-09-05 (×5): 3 mL via INTRAVENOUS

## 2014-09-03 MED ORDER — TIOTROPIUM BROMIDE MONOHYDRATE 18 MCG IN CAPS
18.0000 ug | ORAL_CAPSULE | Freq: Every morning | RESPIRATORY_TRACT | Status: DC
  Administered 2014-09-04 – 2014-09-05 (×2): 18 ug via RESPIRATORY_TRACT
  Filled 2014-09-03 (×2): qty 5

## 2014-09-03 MED ORDER — EPHEDRINE SULFATE 50 MG/ML IJ SOLN
INTRAMUSCULAR | Status: DC | PRN
  Administered 2014-09-03: 10 mg via INTRAVENOUS

## 2014-09-03 MED ORDER — HYDRALAZINE HCL 20 MG/ML IJ SOLN
10.0000 mg | Freq: Once | INTRAMUSCULAR | Status: AC
  Administered 2014-09-03: 10 mg via INTRAVENOUS
  Filled 2014-09-03: qty 0.5

## 2014-09-03 MED ORDER — FENTANYL CITRATE (PF) 100 MCG/2ML IJ SOLN
INTRAMUSCULAR | Status: DC | PRN
  Administered 2014-09-03: 150 ug via INTRAVENOUS
  Administered 2014-09-03: 100 ug via INTRAVENOUS
  Administered 2014-09-03: 50 ug via INTRAVENOUS
  Administered 2014-09-03: 100 ug via INTRAVENOUS
  Administered 2014-09-03 (×2): 50 ug via INTRAVENOUS

## 2014-09-03 MED ORDER — DEXAMETHASONE SODIUM PHOSPHATE 4 MG/ML IJ SOLN: 4.0000 mg | Freq: Four times a day (QID) | INTRAMUSCULAR | Status: AC

## 2014-09-03 MED ORDER — THROMBIN 20000 UNITS EX SOLR
CUTANEOUS | Status: AC
  Filled 2014-09-03: qty 20000

## 2014-09-03 MED ORDER — OXYCODONE HCL 5 MG PO TABS
ORAL_TABLET | ORAL | Status: AC
  Filled 2014-09-03: qty 1

## 2014-09-03 MED ORDER — LIDOCAINE HCL (CARDIAC) 20 MG/ML IV SOLN
INTRAVENOUS | Status: DC | PRN
  Administered 2014-09-03: 50 mg via INTRAVENOUS

## 2014-09-03 MED ORDER — MAGNESIUM CITRATE PO SOLN: 0.5000 | Freq: Once | ORAL | Status: DC

## 2014-09-03 MED ORDER — THROMBIN 20000 UNITS EX KIT
PACK | CUTANEOUS | Status: DC | PRN
  Administered 2014-09-03: 20000 [IU] via TOPICAL

## 2014-09-03 MED ORDER — LIDOCAINE HCL (CARDIAC) 20 MG/ML IV SOLN
INTRAVENOUS | Status: AC
  Filled 2014-09-03: qty 5

## 2014-09-03 MED ORDER — ALBUTEROL SULFATE HFA 108 (90 BASE) MCG/ACT IN AERS: 2.0000 | INHALATION_SPRAY | Freq: Four times a day (QID) | RESPIRATORY_TRACT | Status: DC | PRN

## 2014-09-03 MED ORDER — ACETAMINOPHEN 10 MG/ML IV SOLN
1000.0000 mg | Freq: Four times a day (QID) | INTRAVENOUS | Status: AC
  Administered 2014-09-03 – 2014-09-04 (×4): 1000 mg via INTRAVENOUS
  Filled 2014-09-03 (×3): qty 100

## 2014-09-03 MED ORDER — LABETALOL HCL 5 MG/ML IV SOLN
INTRAVENOUS | Status: DC | PRN
  Administered 2014-09-03: 5 mg via INTRAVENOUS

## 2014-09-03 MED ORDER — LABETALOL HCL 5 MG/ML IV SOLN
INTRAVENOUS | Status: AC
  Filled 2014-09-03: qty 4

## 2014-09-03 MED ORDER — HYDRALAZINE HCL 20 MG/ML IJ SOLN
5.0000 mg | Freq: Once | INTRAMUSCULAR | Status: AC
  Administered 2014-09-03: 5 mg via INTRAVENOUS

## 2014-09-03 MED ORDER — FLEET ENEMA 7-19 GM/118ML RE ENEM
1.0000 | ENEMA | Freq: Once | RECTAL | Status: AC
  Administered 2014-09-05: 1 via RECTAL
  Filled 2014-09-03: qty 1

## 2014-09-03 MED ORDER — METHOCARBAMOL 500 MG PO TABS: 500.0000 mg | ORAL_TABLET | Freq: Three times a day (TID) | ORAL | Status: DC | PRN

## 2014-09-03 MED ORDER — MORPHINE SULFATE (PF) 2 MG/ML IV SOLN
1.0000 mg | INTRAVENOUS | Status: DC | PRN
  Administered 2014-09-03 (×2): 2 mg via INTRAVENOUS
  Filled 2014-09-03: qty 1

## 2014-09-03 MED ORDER — ACETAMINOPHEN 10 MG/ML IV SOLN
INTRAVENOUS | Status: DC | PRN
  Administered 2014-09-03: 1000 mg via INTRAVENOUS

## 2014-09-03 MED ORDER — ONDANSETRON HCL 4 MG/2ML IJ SOLN
INTRAMUSCULAR | Status: AC
  Filled 2014-09-03: qty 2

## 2014-09-03 MED ORDER — ONDANSETRON HCL 4 MG/2ML IJ SOLN
INTRAMUSCULAR | Status: DC | PRN
  Administered 2014-09-03: 4 mg via INTRAVENOUS

## 2014-09-03 MED ORDER — SODIUM CHLORIDE 0.9 % IV SOLN: 250.0000 mL | INTRAVENOUS | Status: DC

## 2014-09-03 MED ORDER — DEXAMETHASONE SODIUM PHOSPHATE 4 MG/ML IJ SOLN
INTRAMUSCULAR | Status: DC | PRN
  Administered 2014-09-03: 8 mg via INTRAVENOUS

## 2014-09-03 MED ORDER — HYDROMORPHONE HCL 1 MG/ML IJ SOLN
0.5000 mg | Freq: Once | INTRAMUSCULAR | Status: AC
Start: 2014-09-03 — End: 2014-09-03
  Administered 2014-09-03: 0.5 mg via INTRAVENOUS

## 2014-09-03 MED ORDER — HEMOSTATIC AGENTS (NO CHARGE) OPTIME
TOPICAL | Status: DC | PRN
  Administered 2014-09-03: 1 via TOPICAL

## 2014-09-03 MED ORDER — MIDAZOLAM HCL 2 MG/2ML IJ SOLN
INTRAMUSCULAR | Status: AC
  Filled 2014-09-03: qty 4

## 2014-09-03 MED ORDER — VENLAFAXINE HCL ER 75 MG PO CP24
75.0000 mg | ORAL_CAPSULE | Freq: Every day | ORAL | Status: DC
  Administered 2014-09-04 – 2014-09-06 (×3): 75 mg via ORAL
  Filled 2014-09-03 (×3): qty 1

## 2014-09-03 MED ORDER — OXYCODONE-ACETAMINOPHEN 10-325 MG PO TABS: 1.0000 | ORAL_TABLET | ORAL | Status: DC | PRN

## 2014-09-03 MED ORDER — PHENOL 1.4 % MT LIQD: 1.0000 | OROMUCOSAL | Status: DC | PRN

## 2014-09-03 MED ORDER — CEFAZOLIN SODIUM 1-5 GM-% IV SOLN
1.0000 g | Freq: Three times a day (TID) | INTRAVENOUS | Status: AC
  Administered 2014-09-03 – 2014-09-04 (×2): 1 g via INTRAVENOUS
  Filled 2014-09-03 (×2): qty 50

## 2014-09-03 MED ORDER — ALBUTEROL SULFATE (2.5 MG/3ML) 0.083% IN NEBU: 2.5000 mg | INHALATION_SOLUTION | Freq: Four times a day (QID) | RESPIRATORY_TRACT | Status: DC | PRN

## 2014-09-03 MED ORDER — PHENYLEPHRINE HCL 10 MG/ML IJ SOLN
10.0000 mg | INTRAMUSCULAR | Status: DC | PRN
  Administered 2014-09-03: 10 ug/min via INTRAVENOUS

## 2014-09-03 MED ORDER — BUPIVACAINE-EPINEPHRINE (PF) 0.25% -1:200000 IJ SOLN
INTRAMUSCULAR | Status: AC
  Filled 2014-09-03: qty 30

## 2014-09-03 MED ORDER — MENTHOL 3 MG MT LOZG: 1.0000 | LOZENGE | OROMUCOSAL | Status: DC | PRN

## 2014-09-03 MED ORDER — HYDROMORPHONE HCL 1 MG/ML IJ SOLN
1.0000 mg | INTRAMUSCULAR | Status: DC | PRN
  Administered 2014-09-03: 1 mg via INTRAVENOUS
  Administered 2014-09-03: 2 mg via INTRAVENOUS
  Administered 2014-09-04: 1 mg via INTRAVENOUS
  Administered 2014-09-04: 2 mg via INTRAVENOUS
  Administered 2014-09-04 (×4): 1 mg via INTRAVENOUS
  Administered 2014-09-05 (×3): 2 mg via INTRAVENOUS
  Administered 2014-09-05: 1 mg via INTRAVENOUS
  Administered 2014-09-05: 2 mg via INTRAVENOUS
  Filled 2014-09-03: qty 1
  Filled 2014-09-03 (×2): qty 2
  Filled 2014-09-03: qty 1
  Filled 2014-09-03 (×5): qty 2
  Filled 2014-09-03 (×5): qty 1
  Filled 2014-09-03: qty 2

## 2014-09-03 MED ORDER — KETAMINE HCL 100 MG/ML IJ SOLN
INTRAMUSCULAR | Status: AC
  Filled 2014-09-03: qty 1

## 2014-09-03 MED ORDER — EPHEDRINE SULFATE 50 MG/ML IJ SOLN
INTRAMUSCULAR | Status: AC
  Filled 2014-09-03: qty 1

## 2014-09-03 MED ORDER — ONDANSETRON HCL 4 MG PO TABS: 4.0000 mg | ORAL_TABLET | Freq: Three times a day (TID) | ORAL | Status: DC | PRN

## 2014-09-03 MED ORDER — SODIUM CHLORIDE 0.9 % IJ SOLN: 3.0000 mL | INTRAMUSCULAR | Status: DC | PRN

## 2014-09-03 MED ORDER — MIDAZOLAM HCL 5 MG/5ML IJ SOLN
INTRAMUSCULAR | Status: DC | PRN
  Administered 2014-09-03: 2 mg via INTRAVENOUS

## 2014-09-03 MED ORDER — ACETAMINOPHEN 10 MG/ML IV SOLN
INTRAVENOUS | Status: AC
  Filled 2014-09-03: qty 100

## 2014-09-03 MED ORDER — BUPIVACAINE-EPINEPHRINE 0.25% -1:200000 IJ SOLN
INTRAMUSCULAR | Status: DC | PRN
  Administered 2014-09-03: 30 mL

## 2014-09-03 MED ORDER — HYDRALAZINE HCL 20 MG/ML IJ SOLN
INTRAMUSCULAR | Status: AC
  Filled 2014-09-03: qty 1

## 2014-09-03 MED ORDER — CARVEDILOL 6.25 MG PO TABS
6.2500 mg | ORAL_TABLET | Freq: Two times a day (BID) | ORAL | Status: DC
  Administered 2014-09-03 – 2014-09-06 (×6): 6.25 mg via ORAL
  Filled 2014-09-03 (×6): qty 1

## 2014-09-03 MED ORDER — MORPHINE SULFATE (PF) 2 MG/ML IV SOLN
INTRAVENOUS | Status: AC
  Filled 2014-09-03: qty 1

## 2014-09-03 MED ORDER — LACTATED RINGERS IV SOLN
INTRAVENOUS | Status: DC
Start: 2014-09-03 — End: 2014-09-06
  Administered 2014-09-03 – 2014-09-04 (×3): via INTRAVENOUS

## 2014-09-03 MED ORDER — DEXAMETHASONE 4 MG PO TABS
4.0000 mg | ORAL_TABLET | Freq: Four times a day (QID) | ORAL | Status: AC
  Administered 2014-09-03 – 2014-09-04 (×3): 4 mg via ORAL
  Filled 2014-09-03 (×3): qty 1

## 2014-09-03 MED ORDER — THROMBIN 20000 UNITS EX KIT
PACK | CUTANEOUS | Status: AC
  Filled 2014-09-03: qty 1

## 2014-09-03 MED ORDER — HYDROMORPHONE HCL 1 MG/ML IJ SOLN
0.2500 mg | INTRAMUSCULAR | Status: DC | PRN
Start: 2014-09-03 — End: 2014-09-03
  Administered 2014-09-03 (×4): 0.5 mg via INTRAVENOUS

## 2014-09-03 MED ORDER — RISPERIDONE 0.5 MG PO TABS
3.0000 mg | ORAL_TABLET | Freq: Every day | ORAL | Status: DC
  Administered 2014-09-04 – 2014-09-05 (×2): 3 mg via ORAL
  Filled 2014-09-03 (×2): qty 6

## 2014-09-03 MED ORDER — OXYCODONE HCL 5 MG PO TABS
10.0000 mg | ORAL_TABLET | ORAL | Status: DC | PRN
  Administered 2014-09-03 – 2014-09-06 (×14): 10 mg via ORAL
  Filled 2014-09-03 (×13): qty 2

## 2014-09-03 MED ORDER — 0.9 % SODIUM CHLORIDE (POUR BTL) OPTIME
TOPICAL | Status: DC | PRN
  Administered 2014-09-03: 1000 mL

## 2014-09-03 MED ORDER — ONDANSETRON HCL 4 MG/2ML IJ SOLN: 4.0000 mg | Freq: Once | INTRAMUSCULAR | Status: DC | PRN

## 2014-09-03 MED ORDER — LACTATED RINGERS IV SOLN
INTRAVENOUS | Status: DC | PRN
  Administered 2014-09-03 (×2): via INTRAVENOUS

## 2014-09-03 MED ORDER — PROPOFOL INFUSION 10 MG/ML OPTIME
INTRAVENOUS | Status: DC | PRN
  Administered 2014-09-03: 100 ug/kg/min via INTRAVENOUS

## 2014-09-03 MED ORDER — SUCCINYLCHOLINE CHLORIDE 20 MG/ML IJ SOLN
INTRAMUSCULAR | Status: DC | PRN
  Administered 2014-09-03: 100 mg via INTRAVENOUS

## 2014-09-03 MED ORDER — KETAMINE HCL 10 MG/ML IJ SOLN
INTRAMUSCULAR | Status: DC | PRN
  Administered 2014-09-03: 10 mg via INTRAVENOUS
  Administered 2014-09-03: 20 mg via INTRAVENOUS
  Administered 2014-09-03 (×2): 10 mg via INTRAVENOUS

## 2014-09-03 MED ORDER — METHOCARBAMOL 500 MG PO TABS
500.0000 mg | ORAL_TABLET | Freq: Four times a day (QID) | ORAL | Status: DC | PRN
  Administered 2014-09-03 – 2014-09-06 (×7): 500 mg via ORAL
  Filled 2014-09-03 (×7): qty 1

## 2014-09-03 MED ORDER — LACTATED RINGERS IV SOLN
INTRAVENOUS | Status: DC | PRN
  Administered 2014-09-03: 09:00:00 via INTRAVENOUS

## 2014-09-03 MED ORDER — GLYCOPYRROLATE 0.2 MG/ML IJ SOLN
INTRAMUSCULAR | Status: DC | PRN
  Administered 2014-09-03: 0.2 mg via INTRAVENOUS

## 2014-09-03 MED ORDER — DEXAMETHASONE SODIUM PHOSPHATE 4 MG/ML IJ SOLN
INTRAMUSCULAR | Status: AC
  Filled 2014-09-03: qty 2

## 2014-09-03 MED ORDER — SURGIFOAM 100 EX MISC
CUTANEOUS | Status: DC | PRN
  Administered 2014-09-03: 1 via TOPICAL

## 2014-09-03 MED ORDER — METHOCARBAMOL 1000 MG/10ML IJ SOLN
500.0000 mg | Freq: Four times a day (QID) | INTRAVENOUS | Status: DC | PRN
  Administered 2014-09-03: 500 mg via INTRAVENOUS
  Filled 2014-09-03 (×2): qty 5

## 2014-09-03 SURGICAL SUPPLY — 80 items
BLADE SURG ROTATE 9660 (MISCELLANEOUS) IMPLANT
BUR EGG ELITE 4.0 (BURR) IMPLANT
BUR EGG ELITE 4.0MM (BURR)
CAGE TLIF XLRG 9 LUMBAR (Cage) ×2 IMPLANT
CAGE TLIF XLRG 9MM LUMBAR (Cage) ×1 IMPLANT
CLIP NEUROVISION LG (CLIP) ×6 IMPLANT
CLOSURE STERI-STRIP 1/2X4 (GAUZE/BANDAGES/DRESSINGS) ×2
CLOSURE STERI-STRIP 1/4X4 (GAUZE/BANDAGES/DRESSINGS) ×3 IMPLANT
CLSR STERI-STRIP ANTIMIC 1/2X4 (GAUZE/BANDAGES/DRESSINGS) ×4 IMPLANT
COVER SURGICAL LIGHT HANDLE (MISCELLANEOUS) ×3 IMPLANT
DRAPE C-ARM 42X72 X-RAY (DRAPES) ×3 IMPLANT
DRAPE C-ARMOR (DRAPES) ×3 IMPLANT
DRAPE POUCH INSTRU U-SHP 10X18 (DRAPES) ×3 IMPLANT
DRAPE SURG 17X23 STRL (DRAPES) ×3 IMPLANT
DRAPE U-SHAPE 47X51 STRL (DRAPES) ×3 IMPLANT
DRSG MEPILEX BORDER 4X4 (GAUZE/BANDAGES/DRESSINGS) ×9 IMPLANT
DRSG MEPILEX BORDER 4X8 (GAUZE/BANDAGES/DRESSINGS) ×3 IMPLANT
DURAPREP 26ML APPLICATOR (WOUND CARE) ×3 IMPLANT
ELECT BLADE 4.0 EZ CLEAN MEGAD (MISCELLANEOUS) ×3
ELECT BLADE 6.5 EXT (BLADE) IMPLANT
ELECT PENCIL ROCKER SW 15FT (MISCELLANEOUS) ×3 IMPLANT
ELECT REM PT RETURN 9FT ADLT (ELECTROSURGICAL) ×3
ELECTRODE BLDE 4.0 EZ CLN MEGD (MISCELLANEOUS) ×1 IMPLANT
ELECTRODE REM PT RTRN 9FT ADLT (ELECTROSURGICAL) ×1 IMPLANT
GLOVE BIOGEL PI IND STRL 8.5 (GLOVE) ×1 IMPLANT
GLOVE BIOGEL PI INDICATOR 8.5 (GLOVE) ×2
GLOVE SS BIOGEL STRL SZ 8.5 (GLOVE) ×3 IMPLANT
GLOVE SUPERSENSE BIOGEL SZ 8.5 (GLOVE) ×6
GOWN STRL REUS W/ TWL LRG LVL3 (GOWN DISPOSABLE) ×1 IMPLANT
GOWN STRL REUS W/TWL 2XL LVL3 (GOWN DISPOSABLE) ×6 IMPLANT
GOWN STRL REUS W/TWL LRG LVL3 (GOWN DISPOSABLE) ×2
GUIDEWIRE NITINOL BEVEL TIP (WIRE) ×12 IMPLANT
IV SOD CHL 0.9% 1000ML (IV SOLUTION) ×6 IMPLANT
KIT BASIN OR (CUSTOM PROCEDURE TRAY) ×3 IMPLANT
KIT NEEDLE NVM5 EMG ELECT (KITS) ×3 IMPLANT
KIT NEEDLE NVM5 EMG ELECTRODE (KITS) ×6
KIT POSITION SURG JACKSON T1 (MISCELLANEOUS) IMPLANT
KIT ROOM TURNOVER OR (KITS) ×3 IMPLANT
LIGHT SOURCE ANGLE TIP STR 7FT (MISCELLANEOUS) ×6 IMPLANT
NEEDLE  MODULE SSEP ×3 IMPLANT
NEEDLE 22X1 1/2 (OR ONLY) (NEEDLE) ×3 IMPLANT
NEEDLE I-PASS III (NEEDLE) ×6 IMPLANT
NEEDLE SPNL 18GX3.5 QUINCKE PK (NEEDLE) ×9 IMPLANT
NEEDLE SSEP/EMG (NEEDLE) ×6 IMPLANT
NS IRRIG 1000ML POUR BTL (IV SOLUTION) ×3 IMPLANT
PACK LAMINECTOMY ORTHO (CUSTOM PROCEDURE TRAY) ×3 IMPLANT
PACK UNIVERSAL I (CUSTOM PROCEDURE TRAY) ×3 IMPLANT
PAD ARMBOARD 7.5X6 YLW CONV (MISCELLANEOUS) ×6 IMPLANT
PATTIES SURGICAL .5 X.5 (GAUZE/BANDAGES/DRESSINGS) IMPLANT
PATTIES SURGICAL .5 X1 (DISPOSABLE) ×3 IMPLANT
POSITIONER HEAD PRONE TRACH (MISCELLANEOUS) ×3 IMPLANT
PROBE BALL TIP NVM5 SNG USE (BALLOONS) ×6 IMPLANT
PUTTY DBX 1CC (Putty) ×3 IMPLANT
PUTTY DBX 1CC DEPUY (Putty) ×1 IMPLANT
ROD RELINE MAS LORD 5.5X50 (Rod) ×6 IMPLANT
SCREW LOCK RELINE 5.5 TULIP (Screw) ×12 IMPLANT
SCREW RELINE MAS POLY 6.5X40MM (Screw) ×3 IMPLANT
SCREW RELINE RED 6.5X45MM POLY (Screw) ×3 IMPLANT
SCREW SHANK MAS MOD 6.5X40MM (Screw) ×3 IMPLANT
SCREW SHANK RELINE 6.5X45MM 2C (Screw) ×3 IMPLANT
SHEET CONFORM 45LX20WX5H (Bone Implant) ×3 IMPLANT
SPINE TULIP RELINE MOD (Neuro Prosthesis/Implant) ×6 IMPLANT
SPONGE LAP 4X18 X RAY DECT (DISPOSABLE) ×6 IMPLANT
SPONGE SURGIFOAM ABS GEL 100 (HEMOSTASIS) ×3 IMPLANT
SURGIFLO W/THROMBIN 8M KIT (HEMOSTASIS) ×3 IMPLANT
SUT BONE WAX W31G (SUTURE) ×3 IMPLANT
SUT MNCRL AB 3-0 PS2 18 (SUTURE) ×9 IMPLANT
SUT MON AB 3-0 SH 27 (SUTURE) ×2
SUT MON AB 3-0 SH27 (SUTURE) ×1 IMPLANT
SUT VIC AB 1 CT1 18XCR BRD 8 (SUTURE) ×2 IMPLANT
SUT VIC AB 1 CT1 8-18 (SUTURE) ×4
SUT VIC AB 2-0 CT1 18 (SUTURE) ×6 IMPLANT
SYR BULB IRRIGATION 50ML (SYRINGE) ×3 IMPLANT
SYR CONTROL 10ML LL (SYRINGE) ×6 IMPLANT
TOWEL OR 17X24 6PK STRL BLUE (TOWEL DISPOSABLE) ×3 IMPLANT
TOWEL OR 17X26 10 PK STRL BLUE (TOWEL DISPOSABLE) ×3 IMPLANT
TRAY FOLEY CATH 16FRSI W/METER (SET/KITS/TRAYS/PACK) ×3 IMPLANT
TULIP SPINE RELINE MOD (Neuro Prosthesis/Implant) ×2 IMPLANT
WATER STERILE IRR 1000ML POUR (IV SOLUTION) ×3 IMPLANT
YANKAUER SUCT BULB TIP NO VENT (SUCTIONS) ×3 IMPLANT

## 2014-09-03 NOTE — Anesthesia Procedure Notes (Signed)
Procedure Name: Intubation Date/Time: 09/03/2014 8:41 AM Performed by: Vennie Homans Pre-anesthesia Checklist: Patient identified, Timeout performed, Emergency Drugs available, Suction available and Patient being monitored Patient Re-evaluated:Patient Re-evaluated prior to inductionOxygen Delivery Method: Circle system utilized Preoxygenation: Pre-oxygenation with 100% oxygen Intubation Type: IV induction Ventilation: Mask ventilation without difficulty Laryngoscope Size: Mac and 4 Grade View: Grade I Tube size: 7.5 mm Number of attempts: 1 Airway Equipment and Method: Stylet Placement Confirmation: ETT inserted through vocal cords under direct vision,  breath sounds checked- equal and bilateral and positive ETCO2 Secured at: 22 cm Tube secured with: Tape Dental Injury: Teeth and Oropharynx as per pre-operative assessment

## 2014-09-03 NOTE — Brief Op Note (Signed)
09/03/2014  11:52 AM  PATIENT:  Charles Cervantes  54 y.o. male  PRE-OPERATIVE DIAGNOSIS:  L5-S1 DDD WITH HNP AND STENOSIS   POST-OPERATIVE DIAGNOSIS:  L5-S1 DDD WITH HNP AND STENOSIS   PROCEDURE:  Procedure(s): POSTERIOR LUMBAR FUSION /TLIF L5-S1      (1 LEVEL) (N/A)  SURGEON:  Surgeon(s) and Role:    * Melina Schools, MD - Primary  PHYSICIAN ASSISTANT:   ASSISTANTS: carmen mayo   ANESTHESIA:   general  EBL:  Total I/O In: 1000 [I.V.:1000] Out: 300 [Urine:150; Blood:150]  BLOOD ADMINISTERED:none  DRAINS: none   LOCAL MEDICATIONS USED:  MARCAINE     SPECIMEN:  No Specimen  DISPOSITION OF SPECIMEN:  N/A  COUNTS:  YES  TOURNIQUET:  * No tourniquets in log *  DICTATION: .Other Dictation: Dictation Number [id number]  PLAN OF CARE: Admit to inpatient   PATIENT DISPOSITION:  PACU - hemodynamically stable.

## 2014-09-03 NOTE — Op Note (Signed)
NAMEIVER, MIKLAS                ACCOUNT NO.:  [id number]  MEDICAL RECORD NO.:  [id number]  LOCATION:  [id number]                        FACILITY:  [facility]  PHYSICIAN:  Sho Salguero D. Rolena Infante, M.D. 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  OPERATIVE PROCEDURE:  Transforaminal lumbar interbody fusion at L5-S1.  FIRST ASSISTANT:  Ronette Deter, PA.  INSTRUMENTATION SYSTEM USED:  NuVasive MIS pedicle screw fixation device 45 mm and 6.5 diameter screws at L5, 40 mm length and 6.5 mm diameter screws at S1.  No adverse intraoperative neuromonitoring events with SSEPs and EMG monitoring.  All screws stimulated above 40.  Used Titanium size 9 extra long cage packed with local bone plus DBX.  Also placed Conform allograft sheet along the anterior annulus.  HISTORY:  This is a very pleasant 54 year old gentleman, who has been having significant back, buttock, and neuropathic left leg pain. Attempts at conservative management have failed to alleviate his symptoms, so we elected to proceed with surgery.  All appropriate risks, benefits, and alternatives were discussed with the patient and consent was obtained.  OPERATIVE NOTE:  The patient was brought to the operating room, placed supine on the operating table.  After successful induction of general anesthesia and trach intubation, TEDs, SCDs, and Foley were inserted. Neuromonitoring representative and then placed all needles for SSEP and EMG monitoring.  The patient was then turned prone onto the Wilson frame and all bony prominences were well padded.  The back was then prepped and draped in a standard fashion.  A time-out was taken to confirm patient,  procedure, and all other pertinent important data.  Once this was completed, we then proceeded with the surgery.  I  identified the lateral borders of the L5 and S1 pedicles  bilaterally.  Infiltrated the area with 0.25% Marcaine with epi.  A small percutaneous incision was made on the right side over the lateral aspect of the L5 and S1 pedicles.  Jamshidi  needle was advanced percutaneously down to the lateral border of the pedicle.  I confirmed satisfactory position in the AP plane and then began malleting the Jamshidi needle into the pedicle. Once I was nearing the medial wall of the pedicle,  I then changed my fluoro view to the lateral.  I confirmed that I was just beyond the posterior vertebral margin of the vertebral body confirming that I had taken 2 transpedicular approach to the pedicle.  I then advanced this into the vertebral body.  I aspirated 10 mL of blood, mixed with the Conform sheet, and then I placed my guidepin through the Jamshidi needle and removed the Jamshidi needle.  I repeated this procedure at S1.  Once both pedicles were cannulated, I capped over the guidepin and placed the appropriate sized pedicle screws at each of these levels.  It should be noted that in addition to monitoring the Jamshidi needle, I also monitored the cap.  I then  stimulated each of the screws and there was no evidence of abnormal EMG activity.  At this point, I then went to the contralateral side.  I made a Wiltse incision over the lateral border of the left L5-S1 pedicles and sharply dissected down to the deep fascia. I bluntly dissected through the paraspinal muscles and then placed my Jamshidi needle on the lateral border of the L5 pedicle.  Using the same technique that I had used on the contralateral side, I advanced the Jamshidi needle using x-ray guidance as well as free running EMG monitoring to the appropriate position, confirmed in the lateral, guidepin was then placed.  I then  repeated this at S1.  I then tapped over each guidepin.  I then placed the same sized screws that I placed on the contralateral side.  The self-retaining retractor blades were attached to the pedicle screws and then connected to the retracting devices.  I then gently distracted the space.  I then mobilized the paraspinal muscles that were still medial with a Cobb elevator and then placed my medial retracting blade.  This allowed me to see the lateral aspect of the spinous process at L5 lamina and the L5-S1 facet complex.  At this point, I used a Bovie to remove all the facet capsule and expose the facet joint itself.  Using an osteotome, I  resected the entire L5 inferior facet in its entirety.  Once this was done, I then used a 3 and 4 mm Kerrison to perform a generous laminotomy of L5.  At this point, I then released the ligamentum flavum and removed this to expose the thecal sac.  I then continued my dissection into the lateral recess. There was significant hypertrophy and thickening of the facet  capsule and ligamentum flavum on the medial side.  This was completely removed using a 3-mm Kerrison punch.  I exposed the S1 nerve root and then performed a foraminotomy.  I then went and resected the L5 pars and then palpated the inferior border of the L5 pedicle.  I then continued medially trimming down the remaining lamina until I could expose the L5 nerve root as it was exiting from the thecal sac and traveling in the foramen.  At this point, I had completely removed the posterolateral aspect of the spine with a complete facetectomy  and pars resection.  I then trimmed down the osteophyte from the superior aspect of the S1 facet to allow excellent visualization of the disk.  At this point, I obtained hemostasis with bipolar electrocautery, coagulating the large epidural veins.  I then identified the L5 nerve root and protected it with a neural patty and then placed a neural patty  inferiorly to retract the thecal sac and S1 nerve root.  At this point, an annulotomy was performed with a 15 blade scalpel and then using a combination of pituitary rongeurs various side-cutting curettes and reamers, I removed all the disk material at the L5-S1 level.  I then trialed with smooth paddles and elected to use the 9 spacers and they have provided excellent fixation.  I then rasped again to ensure I had bleeding subchondral bone.  I then irrigated the wound copiously with normal saline to remove any free fragments of cartilaginous endplate.  I then packed the Conform allograft along the anterior aspect of the annulus and malleted it to a good position.  I then obtained the 9 extra long implant that was packed with the local bone from the decompression as well  as DDX.  I then malleted this to the appropriate position.  Once it was at the appropriate depth, I then used another impactor in order to change his position from an oblique entry through horizontal position.  Once it was actually horizontal, it rested in the anterior third of the disk space.  I was very pleased with the fixation and position.  At this point, I irrigated the wound copiously with normal saline and obtained hemostasis using bipolar electrocautery and FloSeal.  I then removed the kyphosis that built into the Wilson frame and released the retractors from the pedicle screw heads. Polyaxial heads were then attached and a rod was obtained and was secured into the polyaxial head and the locking caps were advanced down and secured according to manufacturer's standards.  They were then torqued off appropriately.  At this point, I then went through the contralateral side, measured and placed the same size screw.  Again the top locking nuts were advanced according to the manufacture's standards and torqued off.  At this point, final x-rays were taken which were satisfactory.  I then irrigated all the wounds  copiously with normal saline, and made sure I had hemostasis in the left side.  I then placed thrombin-soaked Gelfoam patty over the exposed thecal sac and then closed the deep fascia with a buried interrupted #1 Vicryl suture, superficial with 2-0 Vicryl sutures, and a 3-0 Monocryl for the skin. Once all 3 incisions were closed and dry dressing were applied, the patient was ultimately extubated and transferred to the PACU without incident.  At the end of the case, all needle and sponge counts were correct.  There were no adverse intraoperative events.     Delante Karapetyan D. Rolena Infante, M.D.     DDB/MEDQ  D:  09/03/2014  T:  09/03/2014  Job:  [id number]

## 2014-09-03 NOTE — Anesthesia Preprocedure Evaluation (Signed)
Anesthesia Evaluation  Patient identified by MRN, date of birth, ID band Patient awake    Reviewed: Allergy & Precautions, NPO status , Patient's Chart, lab work & pertinent test results  Airway Mallampati: I  TM Distance: >3 FB Neck ROM: Full    Dental   Pulmonary sleep apnea , Current Smoker,    Pulmonary exam normal       Cardiovascular hypertension, Pt. on medications Normal cardiovascular exam    Neuro/Psych Depression    GI/Hepatic   Endo/Other    Renal/GU      Musculoskeletal   Abdominal   Peds  Hematology   Anesthesia Other Findings   Reproductive/Obstetrics                             Anesthesia Physical Anesthesia Plan  ASA: II  Anesthesia Plan: General   Post-op Pain Management:    Induction: Intravenous  Airway Management Planned: Oral ETT  Additional Equipment:   Intra-op Plan:   Post-operative Plan: Extubation in OR  Informed Consent: I have reviewed the patients History and Physical, chart, labs and discussed the procedure including the risks, benefits and alternatives for the proposed anesthesia with the patient or authorized representative who has indicated his/her understanding and acceptance.     Plan Discussed with: CRNA and Surgeon  Anesthesia Plan Comments:         Anesthesia Quick Evaluation

## 2014-09-03 NOTE — Transfer of Care (Signed)
Immediate Anesthesia Transfer of Care Note  Patient: Charles Cervantes  Procedure(s) Performed: Procedure(s): POSTERIOR LUMBAR FUSION /TLIF L5-S1      (1 LEVEL) (N/A)  Patient Location: PACU  Anesthesia Type:General  Level of Consciousness: sedated, patient cooperative, lethargic and responds to stimulation  Airway & Oxygen Therapy: Patient Spontanous Breathing and Patient connected to face mask oxygen  Post-op Assessment: Report given to RN, Post -op Vital signs reviewed and stable, Patient moving all extremities X 4 and Patient able to stick tongue midline  Post vital signs: stable  Last Vitals:  Filed Vitals:   09/03/14 0649  BP:   Pulse: 61  Temp: 36.5 C  Resp: 20    Complications: No apparent anesthesia complications

## 2014-09-03 NOTE — Anesthesia Postprocedure Evaluation (Signed)
Anesthesia Post Note  Patient: Charles Cervantes  Procedure(s) Performed: Procedure(s) (LRB): POSTERIOR LUMBAR FUSION /TLIF L5-S1      (1 LEVEL) (N/A)  Anesthesia type: general  Patient location: PACU  Post pain: Pain level controlled  Post assessment: Patient's Cardiovascular Status Stable  Last Vitals:  Filed Vitals:   09/03/14 2019  BP: 130/113  Pulse: 76  Temp: 36.7 C  Resp: 16    Post vital signs: Reviewed and stable  Level of consciousness: sedated  Complications: No apparent anesthesia complications
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
## 2014-09-04 ENCOUNTER — Inpatient Hospital Stay (HOSPITAL_COMMUNITY): Payer: Medicaid Other

## 2014-09-04 MED FILL — Thrombin For Soln 20000 Unit: CUTANEOUS | Qty: 1 | Status: AC

## 2014-09-04 NOTE — Progress Notes (Signed)
Patient declined magnesium citrate and fleet enema on previous shift.  Patient educated on the importance of maintaining a bowel regimen with teach back ask me 3 method and patient strongly declined.  Dr. Rolena Infante made aware.

## 2014-09-04 NOTE — Progress Notes (Addendum)
Patient ID: Charles Cervantes, male   DOB: May 06, 1960, 54 y.o.   MRN: [id number]    Subjective: 1 Day Post-Op Procedure(s) (LRB): POSTERIOR LUMBAR FUSION /TLIF L5-S1      (1 LEVEL) (N/A) Patient reports pain as 3 on 0-10 scale.   Denies CP or SOB.  Voiding without difficulty. Positive flatus. Objective: Vital signs in last 24 hours: Temp:  [97 F (36.1 C)-98.6 F (37 C)] 98.6 F (37 C) (09/01 0907) Pulse Rate:  [52-96] 70 (09/01 0907) Resp:  [10-22] 18 (09/01 0907) BP: (130-224)/(81-119) 149/86 mmHg (09/01 0907) SpO2:  [93 %-100 %] 97 % (09/01 1023)  Intake/Output from previous day: 08/31 0701 - 09/01 0700 In: 6140.5 [P.O.:2811; I.V.:2929.5; IV Piggyback:400] Out: 5700 [Urine:5550; Blood:150] Intake/Output this shift: Total I/O In: -  Out: 900 [Urine:900]  Labs: No results for input(s): HGB in the last 72 hours. No results for input(s): WBC, RBC, HCT, PLT in the last 72 hours. No results for input(s): NA, K, CL, CO2, BUN, CREATININE, GLUCOSE, CALCIUM in the last 72 hours. No results for input(s): LABPT, INR in the last 72 hours.  Physical Exam: Neurologically intact ABD soft Sensation intact distally Intact pulses distally Dorsiflexion/Plantar flexion intact Incision: moderate drainage Compartment soft  Assessment/Plan: 1 Day Post-Op Procedure(s) (LRB): POSTERIOR LUMBAR FUSION /TLIF L5-S1      (1 LEVEL) (N/A) Advance diet Up with therapy  Changed his bandage Ordered LSO - pt did not get one at preop  [place]o, Darla Lesches for Dr. Melina Schools [facility] [phone] 09/04/2014, 11:06 AM    Doing well overall Ambulating  Neuro intact xrays satisfactory Plan on d/c Friday.

## 2014-09-04 NOTE — Evaluation (Signed)
Physical Therapy Evaluation Patient Details Name: Charles Cervantes MRN: [id number] DOB: 02-25-1960 Today's Date: 09/04/2014   History of Present Illness  Pt is a 54 y/o M s/p TLIF L5-S1 w/ a PMH including HTN, blood clots, CHF, muscle spasm, COPD, headache, dizziness, weakness, chronic back pain, and depression.  Clinical Impression  Patient is s/p above surgery resulting in functional limitations due to the deficits listed below (see PT Problem List). [name] is very impulsive and demonstrates weakness in Bil LEs (Lt>Rt).  He is very adamant about getting a mattress before returning home as he only has an air mattress currently (left VM w/ SW inquiring about any potential resources).  Patient will benefit from skilled PT to increase their independence and safety with mobility to allow discharge to the venue listed below.      Follow Up Recommendations Home health PT (although per CM insurance will likely not cover HHPT)    Equipment Recommendations  Rolling walker with 5" wheels;Other (comment);3in1 (PT) (pt requesting hospital bed or mattress)    Recommendations for Other Services OT consult     Precautions / Restrictions Precautions Precautions: Back Precaution Booklet Issued: Yes (comment) Precaution Comments: Reviewed all back precautions Required Braces or Orthoses: Spinal Brace Spinal Brace: Lumbar corset;Applied in sitting position;Other (comment) Spinal Brace Comments: Per MD, OK to complete PT evaluation/amb w/o brace Restrictions Weight Bearing Restrictions: No      Mobility  Bed Mobility               General bed mobility comments: Pt ambulating in hall w/ nursing student upon PT arrival  Transfers Overall transfer level: Needs assistance Equipment used: Rolling walker (2 wheeled) Transfers: Sit to/from Stand Sit to Stand: Supervision         General transfer comment: Supervision for safety as pt is impulsive and likes to stand as it is his position of  comfort.  Cues for proper technique and use of RW.  Ambulation/Gait Ambulation/Gait assistance: Supervision Ambulation Distance (Feet): 60 Feet Assistive device: Rolling walker (2 wheeled) Gait Pattern/deviations: Decreased dorsiflexion - right;Antalgic;Trunk flexed   Gait velocity interpretation: Below normal speed for age/gender General Gait Details: Dec Rt DF w/ compensatory steppage gait.  Cues to stand upright.  Stairs            Wheelchair Mobility    Modified Rankin (Stroke Patients Only)       Balance Overall balance assessment: Needs assistance Sitting-balance support: No upper extremity supported;Feet supported Sitting balance-Leahy Scale: Good     Standing balance support: During functional activity;No upper extremity supported Standing balance-Leahy Scale: Fair                               Pertinent Vitals/Pain Pain Assessment: 0-10 Pain Score: 8  Pain Location: back and Lt LE Pain Descriptors / Indicators: Throbbing;Sharp Pain Intervention(s): Limited activity within patient's tolerance;Monitored during session;Repositioned    Home Living Family/patient expects to be discharged to:: Private residence Living Arrangements: Non-relatives/Friends Available Help at Discharge:  (no assist available at d/c) Type of Home: House Home Access: Stairs to enter Entrance Stairs-Rails: None Entrance Stairs-Number of Steps: 2 Home Layout: One level Home Equipment: None Additional Comments: Pt lives alone w/ another veteran (friend) who is unable to provide assist.  His Aunt will be transporitng him home but will not be available for assist per pt.  Pt is requesting hospital bed upon d/c but explained to pt that  his insurance most likely will not cover this. CM made aware.    Prior Function Level of Independence: Independent               Hand Dominance        Extremity/Trunk Assessment   Upper Extremity Assessment: Overall WFL for tasks  assessed           Lower Extremity Assessment: LLE deficits/detail;RLE deficits/detail RLE Deficits / Details: grossly 4/5 strength w/ MMT LLE Deficits / Details: DF: 2+/5, knee F/E: 3/5, hip Abd/add: 4/5     Communication   Communication: No difficulties  Cognition Arousal/Alertness: Awake/alert Behavior During Therapy: WFL for tasks assessed/performed;Restless;Impulsive Overall Cognitive Status: Within Functional Limits for tasks assessed                      General Comments General comments (skin integrity, edema, etc.): Pt is impulsive.  He expresses his need for a mattress at home as right now all he has is an air mattress.  Left VM for SW inquiring about resources for pt to find a mattress for his home.    Exercises        Assessment/Plan    PT Assessment Patient needs continued PT services  PT Diagnosis Difficulty walking;Abnormality of gait;Generalized weakness;Acute pain   PT Problem List Decreased strength;Decreased range of motion;Decreased activity tolerance;Decreased balance;Decreased mobility;Decreased knowledge of use of DME;Decreased safety awareness;Decreased knowledge of precautions;Pain;Decreased skin integrity;Impaired sensation  PT Treatment Interventions DME instruction;Gait training;Stair training;Functional mobility training;Therapeutic activities;Therapeutic exercise;Balance training;Neuromuscular re-education;Patient/family education;Modalities   PT Goals (Current goals can be found in the Care Plan section) Acute Rehab PT Goals Patient Stated Goal: to get a mattress before returning home PT Goal Formulation: With patient Time For Goal Achievement: 09/11/14 Potential to Achieve Goals: Good    Frequency Min 5X/week   Barriers to discharge Inaccessible home environment;Decreased caregiver support No assist upon d/c and steps to enter home    Co-evaluation               End of Session   Activity Tolerance: Patient tolerated  treatment well Patient left: in chair;with call bell/phone within reach;with chair alarm set Nurse Communication: Mobility status;Precautions;Other (comment) (pt is impulsive, chair alarm set)         Time: 4174-0814 PT Time Calculation (min) (ACUTE ONLY): 19 min   Charges:   PT Evaluation $Initial PT Evaluation Tier I: 1 Procedure     PT G Codes:       Joslyn Hy PT, DPT [phone] Pager: [phone] 09/04/2014, 4:42 PM

## 2014-09-04 NOTE — Progress Notes (Signed)
Attempted to see pt today for OT eval but no brace in room.  MD notified through his office. Will attempt back as schedule allows. Jinger Neighbors, [place] [phone]

## 2014-09-04 NOTE — Plan of Care (Signed)
Problem: Consults Goal: Diagnosis - Spinal Surgery Thoraco/Lumbar Spine Fusion L5-S1     

## 2014-09-04 NOTE — Progress Notes (Signed)
PT Cancellation Note  Patient Details Name: Charles Cervantes MRN: [id number] DOB: 1960-10-22   Cancelled Treatment:    Reason Eval/Treat Not Completed: Medical issues which prohibited therapy (Needs to get his back brace).  Explained to pt that we are awaiting an order from MD and will return to see when brace arrives.   Ramond Dial 09/04/2014, 8:41 AM   Mee Hives, PT MS Acute Rehab Dept. Number: [facility] [id number] and [place] [phone]

## 2014-09-05 ENCOUNTER — Encounter (HOSPITAL_COMMUNITY): Payer: Self-pay | Admitting: General Practice

## 2014-09-05 HISTORY — PX: LUMBAR FUSION: SHX111

## 2014-09-05 MED ORDER — BISACODYL 10 MG RE SUPP
10.0000 mg | Freq: Once | RECTAL | Status: AC
  Administered 2014-09-05: 10 mg via RECTAL
  Filled 2014-09-05 (×2): qty 1

## 2014-09-05 NOTE — Progress Notes (Signed)
    Subjective: Procedure(s) (LRB): POSTERIOR LUMBAR FUSION /TLIF L5-S1      (1 LEVEL) (N/A) 2 Days Post-Op  Patient reports pain as 3 on 0-10 scale.  Reports decreased leg pain reports incisional back pain   Positive void Negative bowel movement Positive flatus Negative chest pain or shortness of breath  Objective: Vital signs in last 24 hours: Temp:  [97.7 F (36.5 C)-98.6 F (37 C)] 98.1 F (36.7 C) (09/02 0458) Pulse Rate:  [70-102] 102 (09/02 0458) Resp:  [17-18] 18 (09/02 0458) BP: (128-189)/(69-93) 189/93 mmHg (09/02 0458) SpO2:  [95 %-98 %] 98 % (09/02 0458)  Intake/Output from previous day: 09/01 0701 - 09/02 0700 In: 1730 [P.O.:1730] Out: 1700 [Urine:1700]  Labs: No results for input(s): WBC, RBC, HCT, PLT in the last 72 hours. No results for input(s): NA, K, CL, CO2, BUN, CREATININE, GLUCOSE, CALCIUM in the last 72 hours. No results for input(s): LABPT, INR in the last 72 hours.  Physical Exam: Neurologically intact ABD soft Intact pulses distally Incision: dressing C/D/I Compartment soft  Assessment/Plan: Patient stable  xrays satisfactory Continue mobilization with physical therapy Continue care  Advance diet Up with therapy Plan for discharge tomorrow to home  Melina Schools, [place] [phone]

## 2014-09-05 NOTE — Evaluation (Signed)
Occupational Therapy Evaluation Patient Details Name: Charles Cervantes MRN: [id number] DOB: 06-17-1960 Today's Date: 09/05/2014    History of Present Illness Pt is a 54 y/o M s/p TLIF L5-S1 w/ a PMH including HTN, blood clots, CHF, muscle spasm, COPD, headache, dizziness, weakness, chronic back pain, and depression.   Clinical Impression   Patient presenting with decreased ADL, IADL independence secondary to above. Patient independent PTA. Patient currently requires max assist for LB ADLs secondary to back precautions and not being able to cross BLEs. Pt is set-up for UB ADLs, min assist to don/doff lumbar corset and mod I with sit<>stands. Patient will benefit from acute OT to increase overall independence in the areas of ADLs, functional mobility, and overall safety in order to safely discharge home.     Follow Up Recommendations  Home health OT;Supervision/Assistance - 24 hour    Equipment Recommendations  3 in 1 bedside comode;Tub/shower bench    Recommendations for Other Services  None at this time   Precautions / Restrictions Precautions Precautions: Back Precaution Comments: Reviewed all back precautions Required Braces or Orthoses: Spinal Brace Spinal Brace: Applied in sitting position;Lumbar corset Restrictions Weight Bearing Restrictions: No      Mobility Bed Mobility Overal bed mobility: Needs Assistance Bed Mobility: Supine to Sit;Sit to Supine     Supine to sit: Supervision;HOB elevated Sit to supine: Supervision;HOB elevated   General bed mobility comments: Supervision for cueing for correct body mechanics   Transfers Overall transfer level: Modified independent   Transfers: Sit to/from Stand Sit to Stand: Modified independent (Device/Increase time) Stand pivot transfers: Modified independent (Device/Increase time)   Balance Overall balance assessment: Needs assistance Sitting-balance support: No upper extremity supported;Feet supported Sitting  balance-Leahy Scale: Good     Standing balance support: Bilateral upper extremity supported;During functional activity Standing balance-Leahy Scale: Good      ADL Overall ADL's : Needs assistance/impaired General ADL Comments: Pt unable to cross BLEs for LB ADLs, pt will need AE to increase independence with LB ADLs. Pt reports he has no assistance post acute d/c. Pt has a tub/shower and will need a TTB for safe showering. Pt sat EOB to work on donning/doffing lumbar corset and pt required min assist for this. Pt ambulated EOB> BR using RW at mod I level. Pt transferred onto [facility] and asked to be left for his privacy. Therapist reiterated importance of calling for assistance once finished, pt verbalized understanding of this.     Pertinent Vitals/Pain Pain Assessment: 0-10 Pain Score: 8  Pain Location: back and stomach Pain Descriptors / Indicators: Aching;Cramping;Sharp Pain Intervention(s): Limited activity within patient's tolerance;Monitored during session;Repositioned;Premedicated before session     Hand Dominance Right   Extremity/Trunk Assessment Upper Extremity Assessment Upper Extremity Assessment: Overall WFL for tasks assessed   Lower Extremity Assessment Lower Extremity Assessment: Defer to PT evaluation   Cervical / Trunk Assessment Cervical / Trunk Assessment: Normal   Communication Communication Communication: No difficulties   Cognition Arousal/Alertness: Lethargic;Suspect due to medications Behavior During Therapy: [facility] for tasks assessed/performed Overall Cognitive Status: Within Functional Limits for tasks assessed             Home Living Family/patient expects to be discharged to:: Private residence Living Arrangements: Non-relatives/Friends Available Help at Discharge:  (no assistance available at d/c) Type of Home: House Home Access: Stairs to enter [employer] of Steps: 2 Entrance Stairs-Rails: None Home Layout: One level     Bathroom  Shower/Tub: Corporate investment banker: Standard  Home Equipment: None   Prior Functioning/Environment Level of Independence: Independent     OT Diagnosis: Generalized weakness;Acute pain   OT Problem List: Decreased strength;Decreased activity tolerance;Decreased knowledge of use of DME or AE;Decreased knowledge of precautions;Pain   OT Treatment/Interventions: Self-care/ADL training;Energy conservation;DME and/or AE instruction;Therapeutic activities;Patient/family education    OT Goals(Current goals can be found in the care plan section) Acute Rehab OT Goals Patient Stated Goal: go to the bathroom OT Goal Formulation: With patient Time For Goal Achievement: 09/19/14 Potential to Achieve Goals: Good ADL Goals Pt Will Perform Grooming: with modified independence;standing Pt Will Perform Upper Body Bathing: Independently;sitting Pt Will Perform Lower Body Bathing: with modified independence;sit to/from stand;with adaptive equipment Pt Will Perform Upper Body Dressing: Independently;sitting Pt Will Perform Lower Body Dressing: with modified independence;sit to/from stand;with adaptive equipment Pt Will Transfer to Toilet: with modified independence;ambulating;bedside commode Pt Will Perform Toileting - Clothing Manipulation and hygiene: with modified independence;sit to/from stand Pt Will Perform Tub/Shower Transfer: Tub transfer;tub bench;rolling walker;ambulating;with modified independence Additional ADL Goal #1: Pt will independently don/doff back brace 100% of the time  OT Frequency: Min 3X/week   Barriers to D/C: Decreased caregiver support   End of Session Equipment Utilized During Treatment: Rolling walker;Back brace Nurse Communication: Other (comment) (notified NT that patient in BR and aware to call when finished)  Activity Tolerance: Patient tolerated treatment well Patient left: with call bell/phone within reach (sitting on toilet in BR)   Time:  9323-5573 OT Time Calculation (min): 16 min Charges:  OT General Charges $OT Visit: 1 Procedure OT Evaluation $Initial OT Evaluation Tier I: 1 Procedure  Edwin Baines , MS, OTR/L, CLT Pager: [phone]  09/05/2014, 1:18 PM

## 2014-09-05 NOTE — Progress Notes (Signed)
Physical Therapy Treatment Patient Details Name: Charles Cervantes MRN: [id number] DOB: 11-09-1960 Today's Date: 09/05/2014    History of Present Illness Pt is a 54 y/o M s/p TLIF L5-S1 w/ a PMH including HTN, blood clots, CHF, muscle spasm, COPD, headache, dizziness, weakness, chronic back pain, and depression.    PT Comments    Pt was seen x 2 for a session as he was started and then positioned for breakfast.  Pt is stating his pain is too much for gait on the hall and for ther ex to LE's.  Tried to increase his comfort with positioning both on the bed and chair and nursing did share pt has been non compliant with brace use since he has it.  Pt did seem more understanding about the need to use it to help manage his pain.  Walking in room alone when PT arrived so is up and more independent.  Will retry tomorrow to cover a longer walk and stairs to get ready for home.  Follow Up Recommendations  Home health PT     Equipment Recommendations  Rolling walker with 5" wheels;Other (comment) (Pt asking for new mattress)    Recommendations for Other Services OT consult     Precautions / Restrictions Precautions Precautions: Back Precaution Booklet Issued: Yes (comment) Precaution Comments: Reviewed all back precautions Required Braces or Orthoses: Spinal Brace Spinal Brace: Applied in sitting position;Lumbar corset Spinal Brace Comments: Pt has been up without brace a great deal of time per nursing and is now in more pain Restrictions Weight Bearing Restrictions: No    Mobility  Bed Mobility Overal bed mobility: Needs Assistance Bed Mobility: Supine to Sit;Sit to Supine     Supine to sit: Supervision (cues for correct body mechanics) Sit to supine: Supervision (cues for body mechanics)   General bed mobility comments: Pt was up in room when PT arrived  Transfers Overall transfer level: Modified independent Equipment used: Rolling walker (2 wheeled) Transfers: Sit to/from  [facility] Sit to Stand: Modified independent (Device/Increase time) Stand pivot transfers: Modified independent (Device/Increase time)       General transfer comment: reminded pt about using hands to control sitting  Ambulation/Gait Ambulation/Gait assistance: Supervision Ambulation Distance (Feet): 50 Feet Assistive device: Rolling walker (2 wheeled) Gait Pattern/deviations: Step-through pattern;Trunk flexed;Wide base of support Gait velocity: reduced Gait velocity interpretation: Below normal speed for age/gender General Gait Details: Pt is over a walker that is too short and PT adjusted the height   Stairs Stairs:  (declined to go out of room )          Wheelchair Mobility    Modified Rankin (Stroke Patients Only)       Balance Overall balance assessment: Needs assistance Sitting-balance support: Feet supported Sitting balance-Leahy Scale: Good     Standing balance support: Bilateral upper extremity supported Standing balance-Leahy Scale: Fair                      Cognition Arousal/Alertness: Awake/alert Behavior During Therapy: Agitated Overall Cognitive Status: Within Functional Limits for tasks assessed                      Exercises      General Comments General comments (skin integrity, edema, etc.): has some blood on sheets from incision but looks to be from overnight.  Pt is wearing brace when PT arrived and was told by nursing he was noncompliant with it yesterday.  When PT arrived  to finish his session he was in bed with brace on.  Again reviewed the use of the brace.      Pertinent Vitals/Pain Pain Assessment: Faces Faces Pain Scale: Hurts even more Pain Location: back Pain Descriptors / Indicators: Aching;Operative site guarding Pain Intervention(s): Limited activity within patient's tolerance;Monitored during session;Premedicated before session;Repositioned;Patient requesting pain meds-RN notified    Home  Living                      Prior Function            PT Goals (current goals can now be found in the care plan section) Acute Rehab PT Goals Patient Stated Goal: to get some medication Progress towards PT goals: Progressing toward goals    Frequency  Min 5X/week    PT Plan Current plan remains appropriate    Co-evaluation             End of Session Equipment Utilized During Treatment: Back brace Activity Tolerance: Patient limited by pain Patient left: in bed;in chair;with call bell/phone within reach;with nursing/sitter in room (nursing stated he did not need the alarm)     Time: 0709 [phone] ([phone]) PT Time Calculation (min) (ACUTE ONLY): 9 min  Charges:  $Gait Training: 8-22 mins                    G Codes:      Ramond Dial 09-21-14, 8:27 AM   Mee Hives, PT MS Acute Rehab Dept. Number: [facility] [id number] and [place] [phone]

## 2014-09-05 NOTE — Care Management Note (Signed)
Case Management Note  Patient Details  Name: Charles Cervantes MRN: [id number] Date of Birth: [birth year]  Subjective/Objective:         S/p L5-S1 TLIF           Action/Plan: PT recommended HHPT. Medicaid does not cover home health PT for patient's diagnosis/procedure. Explained this to patient and he does not choose to pay out of pocket.  PT recommended rolling walker and 3N1, contacted James with [facility] and requested they be delivered to patient's room. Patient stated that he will have assistance at home. Will continue to follow.     Expected Discharge Date:                  Expected Discharge Plan:  Home/Self Care  In-House Referral:  NA  Discharge planning Services  CM Consult  Post Acute Care Choice:  Durable Medical Equipment Choice offered to:     DME Arranged:  3-N-1, Walker rolling DME Agency:  [place]:    [place] Agency:     Status of Service:  In process, will continue to follow  Medicare Important Message Given:    Date Medicare IM Given:    Medicare IM give by:    Date Additional Medicare IM Given:    Additional Medicare Important Message give by:     If discussed at [place] of Stay Meetings, dates discussed:    Additional Comments:  Nila Nephew, RN 09/05/2014, 10:46 AM

## 2014-09-06 NOTE — Progress Notes (Signed)
Physical Therapy Treatment Patient Details Name: Charles Cervantes MRN: [id number] DOB: [birth year] Today's Date: 09/06/2014    History of Present Illness Pt is a 54 y/o M s/p TLIF L5-S1 w/ a PMH including HTN, blood clots, CHF, muscle spasm, COPD, headache, dizziness, weakness, chronic back pain, and depression.    PT Comments    Charles Cervantes continues to be impulsive and requires verbal cues to redirect to stay on task.  Pt demonstrated ability to ambulate 800 ft and ascend/descend 2 steps this session.  Pt is appropriate for d/c from a mobility standpoint.  Pt will benefit from continued skilled PT services to increase functional independence and safety.   Follow Up Recommendations  Home health PT     Equipment Recommendations  Rolling walker with 5" wheels;Other (comment) (Pt asking for new mattress)    Recommendations for Other Services       Precautions / Restrictions Precautions Precautions: Back Precaution Comments: Reviewed all back precautions Required Braces or Orthoses: Spinal Brace Spinal Brace: Applied in sitting position;Lumbar corset Spinal Brace Comments: Pt reports he applied his brace and it was rotated to the Rt.  Educated pt on correct alignment of brace and had him adjust so it was on correctly.  Encouraged pt to use mirror at home to ensure it is on straight. Restrictions Weight Bearing Restrictions: No    Mobility  Bed Mobility Overal bed mobility: Needs Assistance Bed Mobility: Rolling;Sidelying to Sit;Sit to Sidelying Rolling: Supervision Sidelying to sit: Supervision     Sit to sidelying: Supervision General bed mobility comments: Cues for proper log roll technique to avoid twisting.  Pt completed log roll w/ supervision.  Transfers Overall transfer level: Modified independent Equipment used: Rolling walker (2 wheeled) Transfers: Sit to/from Stand Sit to Stand: Modified independent (Device/Increase time) Stand pivot transfers: Modified independent  (Device/Increase time)       General transfer comment: reminded pt about using hands to control sitting and to keep his RW w/ him until he gets close to chair/bed  Ambulation/Gait Ambulation/Gait assistance: Supervision Ambulation Distance (Feet): 800 Feet Assistive device: Rolling walker (2 wheeled) Gait Pattern/deviations: Step-through pattern;Decreased stride length;Antalgic;Trunk flexed Gait velocity: reduced Gait velocity interpretation: Below normal speed for age/gender General Gait Details: Cues to stand upright and keep hips within RW to avoid trunk flexion.   Stairs Stairs: Yes Stairs assistance: Min guard Stair Management: No rails;Forwards;Alternating pattern Number of Stairs: 2 General stair comments: 1 person hand held assist and verbal cues for technique that pt will use at home w/ Aunt, pt verbalized and demonstrated understanding.  Wheelchair Mobility    Modified Rankin (Stroke Patients Only)       Balance Overall balance assessment: Needs assistance Sitting-balance support: No upper extremity supported;Feet supported Sitting balance-Leahy Scale: Good     Standing balance support: Bilateral upper extremity supported;During functional activity Standing balance-Leahy Scale: Good                      Cognition Arousal/Alertness: Awake/alert Behavior During Therapy: Impulsive Overall Cognitive Status: Within Functional Limits for tasks assessed                      Exercises      General Comments General comments (skin integrity, edema, etc.): Pt continues to be impulsive and need cues to redirect to stay focused.        Pertinent Vitals/Pain Pain Assessment: Faces Faces Pain Scale: Hurts even more Pain Location: back Pain Descriptors /  Indicators: Grimacing;Moaning Pain Intervention(s): Limited activity within patient's tolerance;Monitored during session;Repositioned    Home Living                      Prior Function             PT Goals (current goals can now be found in the care plan section) Acute Rehab PT Goals Patient Stated Goal: to go home today PT Goal Formulation: With patient Time For Goal Achievement: 09/11/14 Potential to Achieve Goals: Good Progress towards PT goals: Progressing toward goals    Frequency  Min 5X/week    PT Plan Current plan remains appropriate    Co-evaluation             End of Session Equipment Utilized During Treatment: Back brace Activity Tolerance: Patient tolerated treatment well Patient left: in bed;with call bell/phone within reach     Time: 0955-1005 PT Time Calculation (min) (ACUTE ONLY): 10 min  Charges:  $Gait Training: 8-22 mins                    G Codes:      Charles Cervantes PT, DPT [phone] Pager: [phone] 09/06/2014, 1:47 PM

## 2014-09-06 NOTE — Progress Notes (Signed)
Subjective: 3 Days Post-Op Procedure(s) (LRB): POSTERIOR LUMBAR FUSION /TLIF L5-S1      (1 LEVEL) (N/A) Patient reports pain as well controlled.  Tolerating PO's. Progressing with PT. No bowel or bladder changes. Reports he is ready to D/c.  Objective: Vital signs in last 24 hours: Temp:  [98.3 F (36.8 C)-98.8 F (37.1 C)] 98.6 F (37 C) (09/03 0533) Pulse Rate:  [74-94] 94 (09/03 0533) Resp:  [18-19] 19 (09/03 0533) BP: (106-148)/(66-94) 106/66 mmHg (09/03 0533) SpO2:  [90 %-99 %] 96 % (09/03 0533)  Intake/Output from previous day: 09/02 0701 - 09/03 0700 In: 960 [P.O.:960] Out: -  Intake/Output this shift:    No results for input(s): HGB in the last 72 hours. No results for input(s): WBC, RBC, HCT, PLT in the last 72 hours. No results for input(s): NA, K, CL, CO2, BUN, CREATININE, GLUCOSE, CALCIUM in the last 72 hours. No results for input(s): LABPT, INR in the last 72 hours.  Alert and oriented x3. RRR, Lungs clear, BS x4. calves soft and non tender. back dressing C/D/I. No DVT signs. No signs of infection or compartment syndrome. BLE grossly neurovascularly intact. Brace in place.   Assessment/Plan: 3 Days Post-Op Procedure(s) (LRB): POSTERIOR LUMBAR FUSION /TLIF L5-S1      (1 LEVEL) (N/A) D/c home today  Follow Dr. Rolena Infante instructions Take Rx as directed F/u in office with Dr. Rolena Infante Pt instructed to take work related paper work by the office.  STILWELL, BRYSON L 09/06/2014, 10:02 AM

## 2014-09-06 NOTE — Progress Notes (Signed)
Occupational Therapy Treatment Patient Details Name: DECARLO RIVET MRN: [id number] DOB: [birth year] Today's Date: 09/06/2014    History of present illness Pt is a 54 y/o M s/p TLIF L5-S1 w/ a PMH including HTN, blood clots, CHF, muscle spasm, COPD, headache, dizziness, weakness, chronic back pain, and depression.   OT comments  Patient making progress toward OT goals, continue plan of care for now. Administered hip kit > patient(reacher, sock aid, LH sponge, LH shoe horn). Pt demonstrated correct and appropriate use of each item in kit.   Follow Up Recommendations  Home health OT;Supervision - Intermittent    Equipment Recommendations  3 in 1 bedside comode;Tub/shower bench    Recommendations for Other Services  None at this time   Precautions / Restrictions Precautions Precautions: Back Precaution Comments: Reviewed all back precautions, pt able to verbalize 2/3 Required Braces or Orthoses: Spinal Brace Spinal Brace: Applied in sitting position;Lumbar corset Restrictions Weight Bearing Restrictions: No       Mobility Bed Mobility Pt found up in room upon OT entering/exiting  Transfers Overall transfer level: Modified independent Equipment used: Rolling walker (2 wheeled) Transfers: Sit to/from Stand Sit to Stand: Modified independent (Device/Increase time) Stand pivot transfers: Modified independent (Device/Increase time)   Balance Overall balance assessment: Needs assistance Sitting-balance support: No upper extremity supported;Feet supported Sitting balance-Leahy Scale: Good     Standing balance support: Bilateral upper extremity supported;During functional activity Standing balance-Leahy Scale: Good   ADL Overall ADL's : Needs assistance/impaired General ADL Comments: Administerred AE for LB ADLs. Therapist demonstrated and pt return demonstrate use of reacher, sock aid, LH sponge, and LH shoe horn. Pt also performed tub/shower transfer using 3-in-1. Reiterated  importance of someone being present for shower transfers at this time for safety, pt verbalized his understanding of this. Continue to recommend TTB for shower transfer safety.      Cognition   Behavior During Therapy: WFL for tasks assessed/performed Overall Cognitive Status: Within Functional Limits for tasks assessed                 Pertinent Vitals/ Pain       Pain Assessment: Faces Faces Pain Scale: Hurts even more Pain Location: back Pain Descriptors / Indicators: Guarding;Grimacing Pain Intervention(s): Monitored during session   Frequency Min 3X/week     Progress Toward Goals  OT Goals(current goals can now befound in the care plan section)  Progress towards OT goals: Progressing toward goals     Plan Discharge plan remains appropriate    End of Session Equipment Utilized During Treatment: Rolling walker;Back brace   Activity Tolerance Patient tolerated treatment well   Patient Left  (ambulating around room, pt mod I)    Time: 1014-1030 OT Time Calculation (min): 16 min  Charges: OT General Charges $OT Visit: 1 Procedure OT Treatments $Self Care/Home Management : 8-22 mins  Abdulla Pooley , MS, OTR/L, CLT Pager: [phone]  09/06/2014, 10:41 AM

## 2014-09-06 NOTE — Discharge Summary (Signed)
Physician Discharge Summary  Patient ID: Charles Cervantes MRN: [id number] DOB/AGE: February 24, 1960 54 y.o.  Admit date: 09/03/2014 Discharge date: 09/06/2014  Admission Diagnoses: Back pain  Discharge Diagnoses:  Active Problems:   Back pain   Discharged Condition:Good  Hospital Course:  Charles Cervantes is a 54 y.o. who was admitted to [facility] They were brought to the operating room on 09/03/2014 and underwent Procedure(s): POSTERIOR LUMBAR FUSION /TLIF L5-S1      (1 LEVEL).  Patient tolerated the procedure well and was later transferred to the recovery room and then to the orthopaedic floor for postoperative care.  They were given PO and IV analgesics for pain control following their surgery.  They were given 24 hours of postoperative antibiotics of  Anti-infectives    Start     Dose/Rate Route Frequency Ordered Stop   09/03/14 1730  ceFAZolin (ANCEF) IVPB 1 g/50 mL premix     1 g 100 mL/hr over 30 Minutes Intravenous Every 8 hours 09/03/14 1719 09/04/14 0109   09/03/14 0800  ceFAZolin (ANCEF) IVPB 2 g/50 mL premix     2 g 100 mL/hr over 30 Minutes Intravenous To ShortStay Surgical 09/02/14 1229 09/03/14 0829      PT and OT were ordered .  Discharge planning started.  Patient had a good night on the evening of surgery and started to get up OOB with therapy on day one. Dressing was with normal limits.  The patient had progressed with therapy and meeting their goals. Patient was seen in rounds and was ready to go home.  Consults: N/a  Significant Diagnostic Studies: routine  Treatments: routine  Discharge Exam: Blood pressure 106/66, pulse 94, temperature 98.6 F (37 C), temperature source Oral, resp. rate 19, height 6' 1.5" (1.867 m), weight 86.042 kg (189 lb 11 oz), SpO2 96 %. Well nourished. Alert and oriented x3. RRR, Lungs clear, BS x4. Abdomen soft and non tender. Bilateral Calf soft and non tender. back dressing C/D/I. No DVT signs. Compartment soft. No signs of  infection.  BLE grossly neurovascular intact.  Disposition: 01-Home or Self Care     Medication List    STOP taking these medications        aspirin 81 MG tablet     cyclobenzaprine 10 MG tablet  Commonly known as:  FLEXERIL     gabapentin 300 MG capsule  Commonly known as:  NEURONTIN      TAKE these medications        albuterol 108 (90 BASE) MCG/ACT inhaler  Commonly known as:  PROVENTIL HFA;VENTOLIN HFA  Inhale 2 puffs into the lungs every 6 (six) hours as needed for wheezing or shortness of breath.     carvedilol 6.25 MG tablet  Commonly known as:  COREG  Take 1 tablet (6.25 mg total) by mouth 2 (two) times daily.     furosemide 40 MG tablet  Commonly known as:  LASIX  Take 1 tablet (40 mg total) by mouth daily.     losartan 100 MG tablet  Commonly known as:  COZAAR  Take 1 tablet (100 mg total) by mouth daily.     methocarbamol 500 MG tablet  Commonly known as:  ROBAXIN  Take 1 tablet (500 mg total) by mouth 3 (three) times daily as needed for muscle spasms.     ondansetron 4 MG tablet  Commonly known as:  ZOFRAN  Take 1 tablet (4 mg total) by mouth every 8 (eight) hours as needed for nausea  or vomiting.     oxyCODONE-acetaminophen 10-325 MG per tablet  Commonly known as:  PERCOCET  Take 1 tablet by mouth every 4 (four) hours as needed for pain.     risperiDONE 3 MG tablet  Commonly known as:  RISPERDAL  Take 1 tablet by mouth at bedtime.     tiotropium 18 MCG inhalation capsule  Commonly known as:  SPIRIVA  Place 1 capsule (18 mcg total) into inhaler and inhale every morning.     venlafaxine XR 75 MG 24 hr capsule  Commonly known as:  EFFEXOR XR  Take 1 pill by mouth daily for 1 week, then increase to 2 pills by mouth daily and maintain for 4 weeks.           Follow-up Information    Follow up with Dahlia Bailiff, MD. Schedule an appointment as soon as possible for a visit in 2 weeks.   Specialty:  Orthopedic Surgery   Why:  If symptoms  worsen, For suture removal, For wound re-check   Contact information:   [address] [place] [address] [phone]       Signed: Lajean Manes 09/06/2014, 10:44 AM

## 2014-09-11 DIAGNOSIS — Z79899 Other long term (current) drug therapy: Secondary | ICD-10-CM | POA: Insufficient documentation

## 2014-09-11 DIAGNOSIS — I10 Essential (primary) hypertension: Secondary | ICD-10-CM | POA: Diagnosis not present

## 2014-09-11 DIAGNOSIS — G8929 Other chronic pain: Secondary | ICD-10-CM | POA: Insufficient documentation

## 2014-09-11 DIAGNOSIS — I509 Heart failure, unspecified: Secondary | ICD-10-CM | POA: Diagnosis not present

## 2014-09-11 DIAGNOSIS — Z9889 Other specified postprocedural states: Secondary | ICD-10-CM | POA: Insufficient documentation

## 2014-09-11 DIAGNOSIS — F329 Major depressive disorder, single episode, unspecified: Secondary | ICD-10-CM | POA: Diagnosis not present

## 2014-09-11 DIAGNOSIS — J449 Chronic obstructive pulmonary disease, unspecified: Secondary | ICD-10-CM | POA: Insufficient documentation

## 2014-09-11 DIAGNOSIS — K5909 Other constipation: Secondary | ICD-10-CM | POA: Diagnosis not present

## 2014-09-11 DIAGNOSIS — M545 Low back pain: Secondary | ICD-10-CM | POA: Insufficient documentation

## 2014-09-11 DIAGNOSIS — K5900 Constipation, unspecified: Secondary | ICD-10-CM | POA: Diagnosis present

## 2014-09-11 DIAGNOSIS — Z720 Tobacco use: Secondary | ICD-10-CM | POA: Diagnosis not present

## 2014-09-11 DIAGNOSIS — Z8639 Personal history of other endocrine, nutritional and metabolic disease: Secondary | ICD-10-CM | POA: Diagnosis not present

## 2014-09-12 ENCOUNTER — Emergency Department (HOSPITAL_COMMUNITY)
Admission: EM | Admit: 2014-09-12 | Discharge: 2014-09-12 | Disposition: A | Discharge status: Home / Self Care | Payer: Medicaid Other | Attending: Emergency Medicine | Admitting: Emergency Medicine

## 2014-09-12 ENCOUNTER — Telehealth: Payer: Self-pay | Admitting: *Deleted

## 2014-09-12 ENCOUNTER — Emergency Department (HOSPITAL_COMMUNITY): Payer: Medicaid Other

## 2014-09-12 ENCOUNTER — Encounter (HOSPITAL_COMMUNITY): Payer: Self-pay | Admitting: *Deleted

## 2014-09-12 DIAGNOSIS — M545 Low back pain, unspecified: Secondary | ICD-10-CM

## 2014-09-12 DIAGNOSIS — K5909 Other constipation: Secondary | ICD-10-CM

## 2014-09-12 MED ORDER — FLEET ENEMA 7-19 GM/118ML RE ENEM: 1.0000 | ENEMA | Freq: Every day | RECTAL | Status: DC | PRN

## 2014-09-12 MED ORDER — DOCUSATE SODIUM 100 MG PO CAPS: 100.0000 mg | ORAL_CAPSULE | Freq: Two times a day (BID) | ORAL | Status: DC

## 2014-09-12 MED ORDER — POLYETHYLENE GLYCOL 3350 17 GM/SCOOP PO POWD: ORAL | Status: DC

## 2014-09-12 MED ORDER — HYDROMORPHONE HCL 1 MG/ML IJ SOLN
1.0000 mg | Freq: Once | INTRAMUSCULAR | Status: AC
  Administered 2014-09-12: 1 mg via INTRAMUSCULAR
  Filled 2014-09-12: qty 1

## 2014-09-12 NOTE — ED Notes (Signed)
Pt states he had back surgery on the 31st. States that she back pain is worse and the pain medicines are not helping. Also states that his last BM was the 30th.

## 2014-09-12 NOTE — Discharge Instructions (Signed)
You were seen today for constipation and back pain. Your constipation is likely related to pain medications you're taking for your back.  You need to start a aggressive bowel regimen to improve bowel movements. Regarding her back pain, your surgical incisions appear clean dry and intact and your exam is reassuring. Follow-up with your surgeon as soon as possible.  Constipation Constipation is when a person has fewer than three bowel movements a week, has difficulty having a bowel movement, or has stools that are dry, hard, or larger than normal. As people grow older, constipation is more common. If you try to fix constipation with medicines that make you have a bowel movement (laxatives), the problem may get worse. Long-term laxative use may cause the muscles of the colon to become weak. A low-fiber diet, not taking in enough fluids, and taking certain medicines may make constipation worse.  CAUSES   Certain medicines, such as antidepressants, pain medicine, iron supplements, antacids, and water pills.   Certain diseases, such as diabetes, irritable bowel syndrome (IBS), thyroid disease, or depression.   Not drinking enough water.   Not eating enough fiber-rich foods.   Stress or travel.   Lack of physical activity or exercise.   Ignoring the urge to have a bowel movement.   Using laxatives too much.  SIGNS AND SYMPTOMS   Having fewer than three bowel movements a week.   Straining to have a bowel movement.   Having stools that are hard, dry, or larger than normal.   Feeling full or bloated.   Pain in the lower abdomen.   Not feeling relief after having a bowel movement.  DIAGNOSIS  Your health care provider will take a medical history and perform a physical exam. Further testing may be done for severe constipation. Some tests may include:  A barium enema X-ray to examine your rectum, colon, and, sometimes, your small intestine.   A sigmoidoscopy to examine your  lower colon.   A colonoscopy to examine your entire colon. TREATMENT  Treatment will depend on the severity of your constipation and what is causing it. Some dietary treatments include drinking more fluids and eating more fiber-rich foods. Lifestyle treatments may include regular exercise. If these diet and lifestyle recommendations do not help, your health care provider may recommend taking over-the-counter laxative medicines to help you have bowel movements. Prescription medicines may be prescribed if over-the-counter medicines do not work.  HOME CARE INSTRUCTIONS   Eat foods that have a lot of fiber, such as fruits, vegetables, whole grains, and beans.  Limit foods high in fat and processed sugars, such as french fries, hamburgers, cookies, candies, and soda.   A fiber supplement may be added to your diet if you cannot get enough fiber from foods.   Drink enough fluids to keep your urine clear or pale yellow.   Exercise regularly or as directed by your health care provider.   Go to the restroom when you have the urge to go. Do not hold it.   Only take over-the-counter or prescription medicines as directed by your health care provider. Do not take other medicines for constipation without talking to your health care provider first.  [place] IF:   You have bright red blood in your stool.   Your constipation lasts for more than 4 days or gets worse.   You have abdominal or rectal pain.   You have thin, pencil-like stools.   You have unexplained weight loss. MAKE SURE YOU:  Understand these instructions.  Will watch your condition.  Will get help right away if you are not doing well or get worse. Document Released: 09/18/2003 Document Revised: 12/25/2012 Document Reviewed: 10/01/2012 [facility] Patient Information 2015 [place], [place]. This information is not intended to replace advice given to you by your health care provider. Make sure you discuss  any questions you have with your health care provider.

## 2014-09-12 NOTE — Telephone Encounter (Signed)
Faxed signed "chronic disease referral form" to [facility]. Criteria denoted as recorded below.   Call Documentation      Fidel Levy, RN at 05/16/2014 8:27 AM     Status: Signed       Expand All Collapse All   Faxed "chronic disease referral form" for patient to receive tele-monitoring w/[facility].   Plan of Care is for remote tele-monitoring for 90 days Criteria for Referral - [place] Access [place] Medicaid Parameters: > diastolic BP 62-94 > systolic BP 76-546 > weight 190-210lbs > HR 50-120 bmp > SpO2 <92% > weight gain 2lbs in 24 hrs Initial Order for CHF stage 3 w/HTN, COPD  Discontinue monitoring if patient is non-compliant  Results of tele-monitoring to be faxed monthly for MD review

## 2014-09-12 NOTE — ED Provider Notes (Signed)
CSN: [id number]     Arrival date & time 09/11/14  2354 History  This chart was scribed for Merryl Hacker, MD by Evelene Croon, ED Scribe. This patient was seen in room B15C/B15C and the patient's care was started 3:17 AM.    Chief Complaint  Patient presents with  . Constipation  . Back Pain    The history is provided by the patient. No language interpreter was used.     HPI Comments:  Charles Cervantes is a 54 y.o. male who presents to the Emergency Department s/p lumbar fusion on 09/03/2014 complaining of 10/10 lower back pain following the surgery. He has been taking oxycodone with little relief.  He states that he has been in pain since she left the hospital.  He is not followed up with Dr. Rolena Infante yet. He reports associated constipation states he has not had a BM in ~11days. He has taken a stool softener without relief. Pt denies vomiting, difficulty urinating, weakness, numbness and tingling in his BLE and abdominal pain. No alleviating factors noted. Denies fevers.  Surgeon: Rolena Infante. Pt has a follow up appointment scheduled for 09/19/14  Past Medical History  Diagnosis Date  . Hyperlipidemia   . H/O blood clots 2012    in leg  . Hypertension     takes Losartan and Coreg daily  . CHF (congestive heart failure)     takes Furosemide daily  . Constipation     takes Colace daily  . Muscle spasm     takes Zizanidine daily as needed  . COPD (chronic obstructive pulmonary disease)     Spiriva daily  . Shortness of breath dyspnea     more with exertion but sometimes notices with lying/sitting  . Headache     occasionally  . Dizziness   . Weakness     tingling and numbness  . Chronic back pain   . Depression     takes Effexor daily and Risperdal nightly  . Sleep apnea    Past Surgical History  Procedure Laterality Date  . Left second finger surgery    . Left and right heart catheterization with coronary angiogram N/A 11/20/2012    Procedure: LEFT AND RIGHT HEART  CATHETERIZATION WITH CORONARY ANGIOGRAM;  Surgeon: Jolaine Artist, MD;  Location: [facility] CATH LAB;  Service: Cardiovascular;  Laterality: N/A;  . Lumbar fusion  09/05/2014    L5 S1   No family history on file. Social History  Substance Use Topics  . Smoking status: Current Some Day Smoker -- 0.25 packs/day for 20 years    Types: Cigarettes  . Smokeless tobacco: Never Used     Comment: 1-2 cigs per day (08/15/14)  . Alcohol Use: No     Comment: stopped in april 2014    Review of Systems  Constitutional: Negative.  Negative for fever.  Respiratory: Negative.  Negative for chest tightness and shortness of breath.   Cardiovascular: Negative.  Negative for chest pain.  Gastrointestinal: Positive for constipation. Negative for nausea, vomiting, abdominal pain and diarrhea.  Genitourinary: Negative.  Negative for dysuria and difficulty urinating.  Musculoskeletal: Positive for back pain.  Skin: Negative for rash.  Neurological: Negative for weakness, numbness and headaches.  All other systems reviewed and are negative.     Allergies  Ace inhibitors  Home Medications   Prior to Admission medications   Medication Sig Start Date End Date Taking? Authorizing Provider  albuterol (PROVENTIL HFA;VENTOLIN HFA) 108 (90 BASE) MCG/ACT inhaler Inhale 2 puffs  into the lungs every 6 (six) hours as needed for wheezing or shortness of breath. 04/18/14  Yes Vivi Barrack, MD  carvedilol (COREG) 6.25 MG tablet Take 1 tablet (6.25 mg total) by mouth 2 (two) times daily. 07/14/14  Yes Jolaine Artist, MD  furosemide (LASIX) 40 MG tablet Take 1 tablet (40 mg total) by mouth daily. 08/14/14  Yes Pixie Casino, MD  losartan (COZAAR) 100 MG tablet Take 1 tablet (100 mg total) by mouth daily. 08/06/14  Yes Pixie Casino, MD  methocarbamol (ROBAXIN) 500 MG tablet Take 1 tablet (500 mg total) by mouth 3 (three) times daily as needed for muscle spasms. 09/03/14  Yes Melina Schools, MD  ondansetron (ZOFRAN) 4  MG tablet Take 1 tablet (4 mg total) by mouth every 8 (eight) hours as needed for nausea or vomiting. 09/03/14  Yes Melina Schools, MD  oxyCODONE-acetaminophen (PERCOCET) 10-325 MG per tablet Take 1 tablet by mouth every 4 (four) hours as needed for pain. 09/03/14  Yes Melina Schools, MD  risperiDONE (RISPERDAL) 3 MG tablet Take 1 tablet by mouth at bedtime. 04/21/14  Yes Historical Provider, MD  tiotropium (SPIRIVA) 18 MCG inhalation capsule Place 1 capsule (18 mcg total) into inhaler and inhale every morning. 04/18/14  Yes Vivi Barrack, MD  venlafaxine XR (EFFEXOR XR) 75 MG 24 hr capsule Take 1 pill by mouth daily for 1 week, then increase to 2 pills by mouth daily and maintain for 4 weeks. 11/04/13  Yes Vivi Barrack, MD  docusate sodium (COLACE) 100 MG capsule Take 1 capsule (100 mg total) by mouth every 12 (twelve) hours. 09/12/14   Merryl Hacker, MD  polyethylene glycol powder (MIRALAX) powder Take one capful by mouth 3 times daily until stools are loose 09/12/14   Merryl Hacker, MD  sodium phosphate (FLEET) 7-19 GM/118ML ENEM Place 133 mLs (1 enema total) rectally daily as needed for severe constipation. 09/12/14   Merryl Hacker, MD   BP 131/92 mmHg  Pulse 85  Temp(Src) 98.6 F (37 C) (Oral)  Resp 18  Ht 6' 0.5" (1.842 m)  Wt 186 lb (84.369 kg)  BMI 24.87 kg/m2  SpO2 98% Physical Exam  Constitutional: He is oriented to person, place, and time. He appears well-developed and well-nourished. No distress.  Pacing the room when I entered  HENT:  Head: Normocephalic and atraumatic.  Cardiovascular: Normal rate, regular rhythm and normal heart sounds.   No murmur heard. Pulmonary/Chest: Effort normal and breath sounds normal. No respiratory distress. He has no wheezes.  Abdominal: Soft. Bowel sounds are normal. There is no tenderness. There is no rebound.  Musculoskeletal: He exhibits no edema.  Neurological: He is alert and oriented to person, place, and time. He has normal reflexes.   Normal bilateral lower extremity strength including flexion and extension of the hips, flexion and extension of the knees, dorsi and plantar flexion, no clonus noted  Skin: Skin is warm and dry.  Surgical wounds over the lower lumbar spine, clean dry and intact, no drainage, no underlying redness or erythema, no fluctuance  Psychiatric: He has a normal mood and affect.  Nursing note and vitals reviewed.   ED Course  Procedures   DIAGNOSTIC STUDIES:  Oxygen Saturation is 100% on RA, normal by my interpretation.    COORDINATION OF CARE:  3:21 AM Discussed treatment plan with pt at bedside and pt agreed to plan.  Labs Review Labs Reviewed - No data to display  Imaging Review  Dg Abd 1 View  09/12/2014   CLINICAL DATA:  Constipation for 10 days.  EXAM: ABDOMEN - 1 VIEW  COMPARISON:  None.  FINDINGS: Gas and stool throughout the colon. Gas-filled small bowel. No small or large bowel distention. No radiopaque stones. Postoperative changes with posterior fixation of L5-S1 and intervertebral disc prosthesis.  IMPRESSION: Nonobstructive bowel gas pattern.  Stool-filled colon.   Electronically Signed   By: Lucienne Capers M.D.   On: 09/12/2014 03:52   I have personally reviewed and evaluated these images and lab results as part of my medical decision-making.   EKG Interpretation None      MDM   Final diagnoses:  Other constipation  Midline low back pain without sciatica    Patient presents with constipation and continued back pain after surgery. Nontoxic on exam. Afebrile. No signs or symptoms of cauda equina. Regarding patient's constipation, he has no associated vomiting or abdominal pain. KUB of the abdomen obtained and shows a moderate stool burden. Patient has not optimized bowel regimen and is taking narcotic pain medication at home. Discussed with patient MiraLAX, Colace, and enemas to improve bowel function. Regarding patient's back pain. This is been ongoing and continuous  since surgery. He is neurologically intact. Surgical site is without evidence of infection or drainage. He is afebrile. At this time feel imaging would be low yield as patient's physical exam is reassuring. Discussed with patient) with his orthopedist regarding continued and ongoing pain. Patient stated understanding. He was given 1 dose of pain medication while in the ER but it was also reinforced that the pain medication is likely contributing to his constipation.  After history, exam, and medical workup I feel the patient has been appropriately medically screened and is safe for discharge home. Pertinent diagnoses were discussed with the patient. Patient was given return precautions.  I personally performed the services described in this documentation, which was scribed in my presence. The recorded information has been reviewed and is accurate.    Merryl Hacker, MD 09/12/14 [id number]

## 2014-09-17 ENCOUNTER — Telehealth: Payer: Self-pay | Admitting: Internal Medicine

## 2014-09-17 NOTE — Telephone Encounter (Signed)
She wants to know the status on the paperwork for Charles Cervantes please.

## 2014-09-19 NOTE — Telephone Encounter (Signed)
LM for Kara Pacer, RN that "chronic disease referral form" was faxed on 09/12/14

## 2014-09-22 ENCOUNTER — Encounter: Payer: Medicaid Other | Attending: Physical Medicine & Rehabilitation

## 2014-09-22 ENCOUNTER — Ambulatory Visit: Payer: Medicaid Other | Admitting: Physical Medicine & Rehabilitation

## 2014-09-23 ENCOUNTER — Telehealth: Payer: Self-pay | Admitting: Internal Medicine

## 2014-09-23 NOTE — Telephone Encounter (Signed)
Charles Cervantes wanted to let Charles Cervantes know that she received the paperwork regarding tele-monitoring and she wanted to discuss with you his weight perimeters. Please f/u with her  Thanks

## 2014-09-24 ENCOUNTER — Encounter: Payer: Self-pay | Admitting: *Deleted

## 2014-09-24 NOTE — Telephone Encounter (Signed)
Spoke with Kara Pacer, RN - weight parameters need to be adjusted for patient. Weight parameters were listed at 190lbs-210lbs per previous chronic disease referral form, but patient now weights less than this. Per Webb Silversmith, his weight is in the high 170s - 180s pounds.   Webb Silversmith, RN said a letter can be composed regarding and faxed to [phone]  Message routed to MD to advise on weight parameters

## 2014-09-24 NOTE — Telephone Encounter (Signed)
Ok to adjust weight parameters to 170-180 lbs. His cardiomyopathy has resolved - ?does he still need regular home health care?  I did receive her report.  Ok if you want to send a letter on my behalf.  Dr. Lemmie Evens

## 2014-09-24 NOTE — Telephone Encounter (Signed)
Letter composed and faxed to Webb Silversmith, RN

## 2014-09-25 ENCOUNTER — Ambulatory Visit: Payer: Medicaid Other | Admitting: Pulmonary Disease

## 2014-09-29 ENCOUNTER — Encounter: Payer: Self-pay | Admitting: Pulmonary Disease

## 2014-09-29 ENCOUNTER — Ambulatory Visit (INDEPENDENT_AMBULATORY_CARE_PROVIDER_SITE_OTHER): Payer: Medicaid Other | Admitting: Pulmonary Disease

## 2014-09-29 VITALS — BP 128/96 | HR 63 | Ht 73.5 in | Wt 188.6 lb

## 2014-09-29 DIAGNOSIS — R0602 Shortness of breath: Secondary | ICD-10-CM | POA: Diagnosis not present

## 2014-09-29 DIAGNOSIS — G4733 Obstructive sleep apnea (adult) (pediatric): Secondary | ICD-10-CM | POA: Diagnosis not present

## 2014-09-29 DIAGNOSIS — J449 Chronic obstructive pulmonary disease, unspecified: Secondary | ICD-10-CM

## 2014-09-29 NOTE — Assessment & Plan Note (Signed)
Lung capacity slight improved You have to STOP smoking !! Stop spiriva since this is not working Use albuterol 2 puffs thrice daily as needed

## 2014-09-29 NOTE — Patient Instructions (Addendum)
Lung capacity slight improved You have to STOP smoking !! Stop spiriva since this is not working Use albuterol 2 puffs thrice daily as needed  Get back on CPAP Home sleep study

## 2014-09-29 NOTE — Assessment & Plan Note (Addendum)
Get back on CPAP Would like to perform Home sleep study to see if OSA is improved now that EF has recovered, but will defer this for now given his prior problems in the sleep lab

## 2014-09-29 NOTE — Progress Notes (Signed)
   Subjective:    Patient ID: Charles Cervantes, male    DOB: January 03, 1961, 54 y.o.   MRN: [id number]  HPI  54 yo AAM smoker with systolic heart failure and moderate OSA.   He was diagnosed in 04/2012 in [place] with NICM (normal cors on cath), EF 20-25% -attributed to polysubstance abuse (cocaine, tobacco and alcohol). EF normalised now. He has hypertension and is poorly compliant with medications.  He smoked as much as 1.5 packs per day and has cut down to 3 cigarettes per day     09/29/2014  Chief Complaint  Patient presents with  . Sleep Apnea    CPAP is leaking, has not been using it.  Patient wants to discuss getting back on CPAP machine.    Annual FU On last visit CPAP changed to auto settings but he is not using it States 'my pTSD kicked in, my head's messed up' Continues to smoke 3 cigs/d Takes spiriva twice daily - but not sure this is helping Does not have albuterol No drugs - has been clean x 2 yrs EF has recovered  08/2013 Download on 14 cm - no residuals, but usage about 2h,no leak  Spirometry >> FEv1 59% - better, ratio82  Significant tests/ events  Spirometry 03/2013 - ratio of 72, FEV1 of 1.74-48% and FVC of 2.43-52% consistent with moderate restriction.  PSG  04/2013>> AHI of 26 events per hour with RDI of 43 events per hour and lowest desaturation of 87% during REM sleep   Review of Systems neg for any significant sore throat, dysphagia, itching, sneezing, nasal congestion or excess/ purulent secretions, fever, chills, sweats, unintended wt loss, pleuritic or exertional cp, hempoptysis, orthopnea pnd or change in chronic leg swelling. Also denies presyncope, palpitations, heartburn, abdominal pain, nausea, vomiting, diarrhea or change in bowel or urinary habits, dysuria,hematuria, rash, arthralgias, visual complaints, headache, numbness weakness or ataxia.     Objective:   Physical Exam  Gen. Pleasant, well-nourished, in no distress ENT - no lesions, no post nasal  drip Neck: No JVD, no thyromegaly, no carotid bruits Lungs: no use of accessory muscles, no dullness to percussion, clear without rales or rhonchi  Cardiovascular: Rhythm regular, heart sounds  normal, no murmurs or gallops, no peripheral edema Musculoskeletal: No deformities, no cyanosis or clubbing         Assessment & Plan:

## 2014-10-07 ENCOUNTER — Encounter: Payer: Self-pay | Admitting: Family Medicine

## 2014-10-07 ENCOUNTER — Ambulatory Visit (INDEPENDENT_AMBULATORY_CARE_PROVIDER_SITE_OTHER): Payer: Medicaid Other | Admitting: Family Medicine

## 2014-10-07 VITALS — BP 152/94 | HR 66 | Temp 98.6°F | Ht 73.5 in | Wt 194.2 lb

## 2014-10-07 DIAGNOSIS — I10 Essential (primary) hypertension: Secondary | ICD-10-CM

## 2014-10-07 DIAGNOSIS — M5441 Lumbago with sciatica, right side: Secondary | ICD-10-CM

## 2014-10-07 MED ORDER — HYDROCODONE-ACETAMINOPHEN 10-325 MG PO TABS: 1.0000 | ORAL_TABLET | Freq: Three times a day (TID) | ORAL | Status: DC

## 2014-10-07 NOTE — Patient Instructions (Signed)
Thank you for coming to the clinic today. It was nice seeing you.  We will give you pain medications today until you get in to see the orthopedists again. If you need another refill, you will have to get it from your orthopedic doctor.  Continue with the albuterol for your breathing.  Your blood pressure is a little high today. Please start taking the losartan again. Please come back in 1-2 weeks for a blood pressure recheck.  We checked your cholesterol and diabetes blood work in April and they were normal.  Please come back in 3-6 months for your next follow up, or sooner as needed.  Take care,  Dr Jerline Pain

## 2014-10-07 NOTE — Assessment & Plan Note (Addendum)
Increase in office today. Likely due to being off losartan for one day. Will restart today. Patient instructed to return in 1-2 weeks for BP check. May need additional agent vs increasing dose of coreg if continues to be hypertensive.

## 2014-10-07 NOTE — Assessment & Plan Note (Signed)
Stable s/p surgery. No red flag symptoms. Will give short supply of norco one time. Patient was instructed that further refills must come from his orthopedist office. Patient acknowledged understanding and said that he would be following up with his orthopedist office for his cervical pain.

## 2014-10-07 NOTE — Progress Notes (Signed)
    Subjective:  Charles Cervantes is a 54 y.o. male who presents to the [facility] today with a chief complaint of back pain.   HPI:  Back Pain Patient presents with chronic pain. Has been seen at [facility] where he underwent surgery approximately 4 months ago. States that he still has a significant amount of pain at the surgical site and has continued to have issues with sciatic pain in his right leg. Has been taking hydrocodone for the pain which helps some. Pain described as pins and needles with some numbness. Pain rated as 8/10 today. Has some weakness in his right leg which is at baseline.   No fevers or chills. No bowel or bladder incontinence. No new weakness. No saddle anesthesia.   HTN Patient on coreg and losartan at home. Ran out of losartan yesterday. Has been tolerating these medications with no side effects.  No chest pain. Shortness of breath at baseline. No syncope.  ROS: Per HPI  Objective:  Physical Exam: BP 152/94 mmHg  Pulse 66  Temp(Src) 98.6 F (37 C) (Oral)  Ht 6' 1.5" (1.867 m)  Wt 194 lb 4 oz (88.111 kg)  BMI 25.28 kg/m2  Gen: NAD, resting comfortably CV: RRR with no murmurs appreciated Lungs: NWOB, CTAB with no crackles, wheezes, or rhonchi GI: Normal bowel sounds present. Soft, Nontender, Nondistended. MSK:  -Back: Lumbar surgical site well healing with no signs of infection. Tender to palpation along paraspinous muscles -RLE: Sensation to light touch slightly diminished. Strength 4+/5 throughout.  -LLE: Sensation to light touch intact. Strength 5/5 throughout.  -Gait: No limp noted.  Skin: warm, dry Neuro: Alert and conversational, moves all extremities.  Psych: Normal affect and thought content  Assessment/Plan:  Back pain Stable s/p surgery. No red flag symptoms. Will give short supply of norco one time. Patient was instructed that further refills must come from his orthopedist office. Patient acknowledged understanding and said that he  would be following up with his orthopedist office for his cervical pain.   HTN (hypertension) Increase in office today. Likely due to being off losartan for one day. Will restart today. Patient instructed to return in 1-2 weeks for BP check. May need additional agent vs increasing dose of coreg if continues to be hypertensive.     Algis Greenhouse. Jerline Pain, [place] Resident PGY-2 10/07/2014 4:32 PM

## 2014-10-16 ENCOUNTER — Other Ambulatory Visit: Payer: Self-pay | Admitting: Physical Medicine & Rehabilitation

## 2014-11-13 NOTE — Addendum Note (Signed)
Addended by: Mathis Dad on: 11/13/2014 05:11 PM   Modules accepted: Orders

## 2014-11-13 NOTE — Addendum Note (Signed)
Addended by: Mathis Dad on: 11/13/2014 05:10 PM   Modules accepted: Orders

## 2014-11-17 ENCOUNTER — Encounter: Payer: Self-pay | Admitting: Physical Medicine & Rehabilitation

## 2014-11-17 ENCOUNTER — Other Ambulatory Visit: Payer: Self-pay | Admitting: Pulmonary Disease

## 2014-11-17 ENCOUNTER — Other Ambulatory Visit: Payer: Self-pay | Admitting: *Deleted

## 2014-11-17 ENCOUNTER — Ambulatory Visit (HOSPITAL_BASED_OUTPATIENT_CLINIC_OR_DEPARTMENT_OTHER): Payer: Medicaid Other | Admitting: Physical Medicine & Rehabilitation

## 2014-11-17 ENCOUNTER — Telehealth: Payer: Self-pay | Admitting: Family Medicine

## 2014-11-17 ENCOUNTER — Telehealth: Payer: Self-pay | Admitting: Pulmonary Disease

## 2014-11-17 ENCOUNTER — Encounter: Payer: Medicaid Other | Attending: Physical Medicine & Rehabilitation

## 2014-11-17 VITALS — BP 156/95 | HR 63

## 2014-11-17 DIAGNOSIS — F1910 Other psychoactive substance abuse, uncomplicated: Secondary | ICD-10-CM

## 2014-11-17 DIAGNOSIS — M961 Postlaminectomy syndrome, not elsewhere classified: Secondary | ICD-10-CM

## 2014-11-17 DIAGNOSIS — G894 Chronic pain syndrome: Secondary | ICD-10-CM

## 2014-11-17 DIAGNOSIS — G4733 Obstructive sleep apnea (adult) (pediatric): Secondary | ICD-10-CM

## 2014-11-17 DIAGNOSIS — M4802 Spinal stenosis, cervical region: Secondary | ICD-10-CM | POA: Insufficient documentation

## 2014-11-17 MED ORDER — GABAPENTIN 300 MG PO CAPS: 300.0000 mg | ORAL_CAPSULE | Freq: Four times a day (QID) | ORAL | Status: DC

## 2014-11-17 MED ORDER — TRAMADOL HCL 50 MG PO TABS: 50.0000 mg | ORAL_TABLET | Freq: Four times a day (QID) | ORAL | Status: DC | PRN

## 2014-11-17 NOTE — Telephone Encounter (Addendum)
Patient calling to inquire about Home Sleep Test Order was not entered for HST Advised patient that I would put order in and he will get call from [facility] to have HST scheduled.\ Nothing further needed. Closing encounter   ---------------------  Addendum:  Spoke with Dawne and she said insurance would not cover HST.  Had to put in Split Study per Medicaid.  Order entered.  Patient scheduled for Split Study on 01/28/15 at [place] [facility].  Patient notified of appointment and given telephone number for sleep lab in case he has questions.  Patient also advised that he will receive packet in the mail regarding appointment. Nothing further needed.

## 2014-11-17 NOTE — Patient Instructions (Signed)
Follow-up with Dr. Rolena Infante in regards to your neck pain

## 2014-11-17 NOTE — Telephone Encounter (Signed)
Pt called and would like a referral to a pain clinic. jw

## 2014-11-17 NOTE — Telephone Encounter (Signed)
Will forward to PCP for review of referral for pain clinic. Jacobs Golab, CMA.

## 2014-11-17 NOTE — Progress Notes (Signed)
Subjective:    Patient ID: Charles Cervantes, male    DOB: Feb 01, 1960, 54 y.o.   MRN: [id number]  HPI 54 year old male with history of chronic neck pain greater than low back pain. He was initially seen in this clinic in January 2016. He had primary complaints of neck pain and right shoulder pain at that time. He was sent through some physical therapy. He received trigger point injection to the right upper trapezius. He had imaging study showing cervical spondylosis C5-6 and C6-7 levels. He failed to respond to conservative care which included nonsteroidal anti-inflammatories, muscle relaxants as well as gabapentin. An MRI of the cervical spine demonstrated severe foraminal stenosis right C5-C6 and moderate foraminal stenosis left C5-C6. He was referred to orthopedics. Evaluated by Dr. Rolena Infante, he had complaints of low back pain with some sciatic pain when he saw Dr. Rolena Infante. Did not have sciatic complaints when he was seeing me back in April. I did review some records from [place]. He underwent a lumbar L5-S1 fusion on 09/04/2014. He's had good postoperative healing no wound infections. Has been discharged by Dr. Rolena Infante in regards to his low back but will follow up with him in regards was neck.  Past medical history significant for substance abuse has gone through active substance abuse counseling. Postoperatively he was maintained on hydrocodone and oxycodone but no longer is taking this.  Pain Inventory Average Pain 9 Pain Right Now 8 My pain is constant, sharp, tingling and aching  In the last 24 hours, has pain interfered with the following? General activity 8 Relation with others 0 Enjoyment of life 8 What TIME of day is your pain at its worst? Morning, Daytime, Evening and Night Sleep (in general) Poor  Pain is worse with: walking, bending, sitting, inactivity and standing Pain improves with: medication Relief from Meds: 8  Mobility walk without assistance how many  minutes can you walk? 5 ability to climb steps?  no do you drive?  no Do you have any goals in this area?  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  yes  Physicians involved in your care Any changes since last visit?  yes   History reviewed. No pertinent family history. Social History   Social History  . Marital Status: Single    Spouse Name: N/A  . Number of Children: 0  . Years of Education: N/A   Occupational History  . unemployed    Social History Main Topics  . Smoking status: Current Some Day Smoker -- 0.25 packs/day for 20 years    Types: Cigarettes  . Smokeless tobacco: Never Used     Comment: 1-2 cigs per day (08/15/14)  . Alcohol Use: No     Comment: stopped in april 2014  . Drug Use: Yes  . Sexual Activity: No   Other Topics Concern  . None   Social History Narrative   Past Surgical History  Procedure Laterality Date  . Left second finger surgery    . Left and right heart catheterization with coronary angiogram N/A 11/20/2012    Procedure: LEFT AND RIGHT HEART CATHETERIZATION WITH CORONARY ANGIOGRAM;  Surgeon: Jolaine Artist, MD;  Location: [facility] CATH LAB;  Service: Cardiovascular;  Laterality: N/A;  . Lumbar fusion  09/05/2014    L5 S1   Past Medical History  Diagnosis Date  . Hyperlipidemia   .  H/O blood clots 2012    in leg  . Hypertension     takes Losartan and Coreg daily  . CHF (congestive heart failure) (HCC)     takes Furosemide daily  . Constipation     takes Colace daily  . Muscle spasm     takes Zizanidine daily as needed  . COPD (chronic obstructive pulmonary disease) (HCC)     Spiriva daily  . Shortness of breath dyspnea     more with exertion but sometimes notices with lying/sitting  . Headache     occasionally  . Dizziness   . Weakness     tingling and  numbness  . Chronic back pain   . Depression     takes Effexor daily and Risperdal nightly  . Sleep apnea    BP 156/95 mmHg  Pulse 63  SpO2 94%  Opioid Risk Score:   Fall Risk Score:  `1  Depression screen PHQ 2/9  Depression screen [facility] 2/9 10/07/2014 04/18/2014 03/04/2014  Decreased Interest 0 1 0  Down, Depressed, Hopeless 0 1 0  PHQ - 2 Score 0 2 0  Altered sleeping - 1 -  Tired, decreased energy - 1 -  Change in appetite - 0 -  Feeling bad or failure about yourself  - 1 -  Trouble concentrating - 1 -  Moving slowly or fidgety/restless - 0 -  Suicidal thoughts - 0 -  PHQ-9 Score - 6 -  Difficult doing work/chores - Somewhat difficult -     Review of Systems  Neurological: Positive for weakness and numbness.       Tingling  Gait Instability  Psychiatric/Behavioral: The patient is nervous/anxious.        Depression  All other systems reviewed and are negative.      Objective:   Physical Exam  Constitutional: He is oriented to person, place, and time. He appears well-developed and well-nourished.  HENT:  Head: Normocephalic and atraumatic.  Eyes: Conjunctivae are normal. Pupils are equal, round, and reactive to light.  Neck:  Neck with decreased range of motion but negative Spurling's  Neurological: He is alert and oriented to person, place, and time. No sensory deficit.  Negative straight leg raising test Normal sensation bilateral upper limbs 5/5 strength bilateral hip flexor and knee extensor ankle dorsal flexor plantar flexor as well as bilateral deltoid, biceps, triceps, grip   Psychiatric: He has a normal mood and affect.  Nursing note and vitals reviewed.  Lumbar spine shows  well-healed incisions. He has limited lumbar range of motion flexion extension lateral bending and rotation of 25% or less       Assessment & Plan:  1. Cervical stenosis with chronic neck and shoulder pain on the right side. C5-C6 foraminal stenosis right greater left correspond  with some of his pain complaints however he has no other neurologic findings to corroborate. Nonprogressive no weakness. He plans to follow up with orthopedics on this.  2. Lumbar laminectomy and fusion L5-S1, he is 2 months postop of his narcotic analgesics.  Given his history of polysubstance abuse and recent treatment, we'll not prescribe narcotic analgesics other than tramadol on a long-term basis. Will resume tramadol 50 g 4 times a day Resume gabapentin 300 mg 3 times a day If insufficient improvement consider addition of Zanaflex Follow-up in 6 months

## 2014-11-18 NOTE — Telephone Encounter (Signed)
Referral placed.  Algis Greenhouse. Jerline Pain, [place] Resident PGY-2 11/18/2014 9:33 AM

## 2014-12-31 ENCOUNTER — Other Ambulatory Visit: Payer: Self-pay | Admitting: *Deleted

## 2014-12-31 NOTE — Telephone Encounter (Signed)
carvedilol (COREG) 6.25 MG tablet  Medication   Date: 07/14/2014  Department: [facility]  Ordering/Authorizing: Jolaine Artist, MD      Order Providers    Prescribing Provider Encounter Provider   Jolaine Artist, MD Harvie Junior, CMA    Medication Detail      Disp Refills Start End     carvedilol (COREG) 6.25 MG tablet 60 tablet 3 07/14/2014     Sig - Route: Take 1 tablet (6.25 mg total) by mouth 2 (two) times daily. - Oral    E-Prescribing Status: Receipt confirmed by pharmacy (07/14/2014 4:28 PM EDT)

## 2014-12-31 NOTE — Telephone Encounter (Signed)
Error

## 2015-01-01 ENCOUNTER — Other Ambulatory Visit: Payer: Self-pay | Admitting: *Deleted

## 2015-01-01 NOTE — Telephone Encounter (Signed)
carvedilol (COREG) 6.25 MG tablet  Medication   Date: 07/14/2014  Department: [facility]  Ordering/Authorizing: Jolaine Artist, MD      Order Providers    Prescribing Provider Encounter Provider   Jolaine Artist, MD Harvie Junior, CMA    Medication Detail      Disp Refills Start End     carvedilol (COREG) 6.25 MG tablet 60 tablet 3 07/14/2014     Sig - Route: Take 1 tablet (6.25 mg total) by mouth 2 (two) times daily. - Oral    E-Prescribing Status: Receipt confirmed by pharmacy (07/14/2014 4:28 PM EDT)     Medication Notes       Tamala Julian, JEFFREY W  Fri Sep 12, 2014 3:22 AM (EDT) ....  Teresa Pelton  Tue Aug 26, 2014 2:08 PM (EDT) Filled 08/14/14           Pharmacy    [place], [address]   This was filled under bensimhon the last timem I know he see's Hilty as well but Jasmine filled it last in July, please fill thanks

## 2015-01-02 NOTE — Telephone Encounter (Signed)
THIS PATIENT WAS DISCHARGED FROM THE CHF CLINIC IN 03/2014 PLEASE ADDRESS AND REFILL AS NEEDED PER PROTOCOL FROM DR.HILTY/ [facility]

## 2015-01-06 MED ORDER — CARVEDILOL 6.25 MG PO TABS: 6.2500 mg | ORAL_TABLET | Freq: Two times a day (BID) | ORAL | Status: DC

## 2015-01-28 ENCOUNTER — Ambulatory Visit (HOSPITAL_BASED_OUTPATIENT_CLINIC_OR_DEPARTMENT_OTHER): Payer: Medicaid Other | Attending: Pulmonary Disease | Admitting: Radiology

## 2015-01-28 VITALS — Ht 72.5 in | Wt 180.0 lb

## 2015-01-28 DIAGNOSIS — R0683 Snoring: Secondary | ICD-10-CM | POA: Insufficient documentation

## 2015-01-28 DIAGNOSIS — G4719 Other hypersomnia: Secondary | ICD-10-CM | POA: Diagnosis not present

## 2015-01-28 DIAGNOSIS — G4733 Obstructive sleep apnea (adult) (pediatric): Secondary | ICD-10-CM | POA: Diagnosis not present

## 2015-01-28 DIAGNOSIS — R5383 Other fatigue: Secondary | ICD-10-CM | POA: Insufficient documentation

## 2015-01-28 DIAGNOSIS — I10 Essential (primary) hypertension: Secondary | ICD-10-CM | POA: Diagnosis not present

## 2015-01-28 DIAGNOSIS — I493 Ventricular premature depolarization: Secondary | ICD-10-CM | POA: Diagnosis not present

## 2015-01-28 DIAGNOSIS — G4736 Sleep related hypoventilation in conditions classified elsewhere: Secondary | ICD-10-CM | POA: Diagnosis not present

## 2015-02-04 ENCOUNTER — Telehealth: Payer: Self-pay | Admitting: Pulmonary Disease

## 2015-02-04 DIAGNOSIS — G4733 Obstructive sleep apnea (adult) (pediatric): Secondary | ICD-10-CM | POA: Diagnosis not present

## 2015-02-04 DIAGNOSIS — Z9989 Dependence on other enabling machines and devices: Principal | ICD-10-CM

## 2015-02-04 NOTE — Telephone Encounter (Signed)
Called and spoke with patient.  Patient states that he has a CPAP machine, but needs supplies and needs APS to come out to hook up his equipment.  He says that he has a full face mask and does not need a mask.  Order entered for APS to go to patient's home to set up equipment and bring supplies to patient.  Patient advised that order has been placed.  Advised patient to contact our office if he has not received equipment.   Nothing further needed.

## 2015-02-04 NOTE — Progress Notes (Signed)
Patient Name: Charles Cervantes, Hack Date: 01/28/2015 Gender: Male D.O.B: July 10, 1960 Age (years): 27 Referring Provider: Kara Mead MD, ABSM Height (inches): 73 Interpreting Physician: Kara Mead MD, ABSM Weight (lbs): 180 RPSGT: Carolin Coy BMI: 24 MRN: [id number] Neck Size: 14.00   CLINICAL INFORMATION Sleep Study Type: Split Night CPAP Indication for sleep study: Excessive Daytime Sleepiness, Fatigue, Hypertension, OSA, Snoring Epworth Sleepiness Score: 21   SLEEP STUDY TECHNIQUE As per the AASM Manual for the Scoring of Sleep and Associated Events v2.3 (April 2016) with a hypopnea requiring 4% desaturations. The channels recorded and monitored were frontal, central and occipital EEG, electrooculogram (EOG), submentalis EMG (chin), nasal and oral airflow, thoracic and abdominal wall motion, anterior tibialis EMG, snore microphone, electrocardiogram, and pulse oximetry. Continuous positive airway pressure (CPAP) was initiated when the patient met split night criteria and was titrated according to treat sleep-disordered breathing.   MEDICATIONS Medications administered by patient during sleep study : No sleep medicine administered.   RESPIRATORY PARAMETERS Diagnostic Total AHI (/hr): 18.2 RDI (/hr): 31.3 OA Index (/hr): 0.4 CA Index (/hr): 0.4 REM AHI (/hr): 25.2 NREM AHI (/hr): 16.4 Supine AHI (/hr): 24.5 Non-supine AHI (/hr): 15.62 Min O2 Sat (%): 87.00 Mean O2 (%): 96.11 Time below 88% (min): 0.4   Titration Optimal Pressure (cm): 11 AHI at Optimal Pressure (/hr): 0.0 Min O2 at Optimal Pressure (%): 96.0 Supine % at Optimal (%): 59 Sleep % at Optimal (%): 98     SLEEP ARCHITECTURE The recording time for the entire night was 367.9 minutes. During a baseline period of 180.0 minutes, the patient slept for 151.6 minutes in REM and nonREM, yielding a sleep efficiency of 84.2%. Sleep onset after lights out was 13.9 minutes with a REM latency of 32.0 minutes. The patient spent  12.53% of the night in stage N1 sleep, 67.01% in stage N2 sleep, 0.00% in stage N3 and 20.45% in REM. During the titration period of 186.9 minutes, the patient slept for 144.9 minutes in REM and nonREM, yielding a sleep efficiency of 77.6%. Sleep onset after CPAP initiation was 27.9 minutes with a REM latency of 28.0 minutes. The patient spent 12.42% of the night in stage N1 sleep, 47.60% in stage N2 sleep, 0.00% in stage N3 and 39.98% in REM.   CARDIAC DATA The 2 lead EKG demonstrated sinus rhythm. The mean heart rate was 48.64 beats per minute. Other EKG findings include: PVCs.   LEG MOVEMENT DATA The total Periodic Limb Movements of Sleep (PLMS) were 0. The PLMS index was 0.00 . IMPRESSIONS - Moderate obstructive sleep apnea occurred during the diagnostic portion of the study(AHI = 18.2/hour). An optimal PAP pressure was selected for this patient ( 11 cm of water) - No significant central sleep apnea occurred during the diagnostic portion of the study (CAI = 0.4/hour). - The patient had minimal or no oxygen desaturation during the diagnostic portion of the study (Min O2 = 87.00%) - The patient snored with Moderate snoring volume during the diagnostic portion of the study. - EKG findings include PVCs. - Clinically significant periodic limb movements did not occur during sleep.   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 - Consider an oral guard for Bruxism. - Avoid alcohol, sedatives and other CNS depressants that may worsen sleep apnea and disrupt normal sleep architecture. - Sleep  hygiene should be reviewed to assess factors that may improve sleep quality. - Weight management and regular exercise should be initiated or continued. - Return for re-evaluation after 4 weeks of therapy  Kara Mead MD. FCCP. [facility]

## 2015-02-04 NOTE — Telephone Encounter (Signed)
CPAP 11 cm with full face mask Please ensure that he has machine and supplies  if not please send prescription

## 2015-02-11 ENCOUNTER — Encounter: Payer: Self-pay | Admitting: Internal Medicine

## 2015-02-11 ENCOUNTER — Ambulatory Visit (INDEPENDENT_AMBULATORY_CARE_PROVIDER_SITE_OTHER): Payer: Medicaid Other | Admitting: Internal Medicine

## 2015-02-11 VITALS — BP 128/92 | HR 62 | Ht 74.0 in | Wt 186.0 lb

## 2015-02-11 DIAGNOSIS — Z720 Tobacco use: Secondary | ICD-10-CM

## 2015-02-11 DIAGNOSIS — J438 Other emphysema: Secondary | ICD-10-CM

## 2015-02-11 DIAGNOSIS — G4733 Obstructive sleep apnea (adult) (pediatric): Secondary | ICD-10-CM | POA: Diagnosis not present

## 2015-02-11 DIAGNOSIS — F1910 Other psychoactive substance abuse, uncomplicated: Secondary | ICD-10-CM | POA: Diagnosis not present

## 2015-02-11 DIAGNOSIS — I10 Essential (primary) hypertension: Secondary | ICD-10-CM | POA: Diagnosis not present

## 2015-02-11 DIAGNOSIS — F17200 Nicotine dependence, unspecified, uncomplicated: Secondary | ICD-10-CM

## 2015-02-11 DIAGNOSIS — I5022 Chronic systolic (congestive) heart failure: Secondary | ICD-10-CM | POA: Diagnosis not present

## 2015-02-11 NOTE — Patient Instructions (Signed)
Your physician wants you to follow-up in: 6 months or sooner if needed. You will receive a reminder letter in the mail two months in advance. If you don't receive a letter, please call our office to schedule the follow-up appointment.   If you need a refill on your cardiac medications before your next appointment, please call your pharmacy. 

## 2015-02-11 NOTE — Progress Notes (Signed)
Patient ID: Charles Cervantes, male   DOB: 1960/03/03, 55 y.o.   MRN: [id number] Guide IT Study [facility]CP: [facility] Pulmonology: Dr Elsworth Soho  CC: Short of breath and intermittent chest pain  HPI: Charles Cervantes is a 54 yo AAM diagnosed with systolic heart failure in 04/2012 in [place] secondary to NICM (normal cors on cath), EF 20-25%. As well as polysubstance abuse (cocaine, tobacco and alcohol)--> quit in 04/2012.   He moved from [facility] and was admitted to [facility] on 05/01/12 with progressive dyspnea and orthopnea.  Echo on admit showed EF 20-25% with normal RV.  Discharge weight 147 pounds.  He also had NSVT while in house and we continued his Lifevest placed in [place].  Admitted To [facility] in 8/14 with HF. EF reported 50-55% on echo. On 10/2012 bedside echo in our HF Clinic EF 35-40%. In 11/2012 presented to clinic c/o recurrent CP and severe HF symptoms but seemed well compensated on exam R/L cath performed as below.  I saw Charles Cervantes today in the office in follow-up. He was last seen in March in the heart failure clinic but was dismissed from that clinic due to confrontational behavior with the staff.  He is noted some progressive shortness of breath over the past couple of months. He does have Lasix which she takes only as needed, but is noted some increased abdominal girth and weight gain. He denies any leg swelling and is not more short of breath laying down but has had some progressive shortness of breath. He does have COPD and was supposed to start on Spiriva, but cannot figure out how to administer the medications to himself. He says that he wants to get home health care arranged and that he would need me to sign off on that.  ECHO  04/2012 EF 20-25% 07/17/12 EF 30-35% 08/2012 EF 50-55% at [facility] ECHO 02/27/13 EF 35-40%  RHC/LHC  11/20/12  RA = 6  RV = 33/3/8  PA = 29/9 (18)  PCW = 8  Fick cardiac output/index = 6.4/3.2  PVR = 1.5 WU  FA sat = 95%  PA sat = 72%, 73%  SVC sat  72% Extensive calcification in proximal and mid LAD with only mild intraluminal stenosis with 20-30% stenosis.    CPX 2/15 FVC 2.90 (62%)  FEV1 2.22 (60%) FEV1/FVC 76%  Resting HR: 61 Peak HR: 111 (66% age predicted max HR) BP rest: 146/90 BP peak: 168/86 Peak VO2: 22.6 (64.1% predicted peak VO2) VE/VCO2 slope: 29.4 OUES: 2.49 Peak RER: 0.90  Labs 06/29/12 Potassium 5.4 Creatinine 1.05 07/17/12 Dig Level 0.5 Potassium 4.0 Creatinine 0.95 Pro BNP 244 11/20/12 K 4.0 Creatinine 1.18 12/18/12: K 4.7, Cr 0.96 05/16/13 K 4.5 Creatinine 0.91  Charles Cervantes returns today for follow-up. We repeated his echocardiogram which shows a normalized EF of 55-60%. He reports his breathing has improved. He was asked to take Lasix on a daily basis but is not clear if that is currently what he is doing. He also has run out of losartan and we did get prior authorization for that. This could explain why his blood pressure is elevated today. Medication noncompliance is clearly an issue.  Charles Cervantes returns today for follow-up. He's been compliant with his medications as far as I can tell. He followed up with Dr. Elsworth Soho for sleep study and was found to have sleep apnea. He's been ordered equipment but he says he does not know how to fit it or set it up. He  is describing some shortness of breath, mostly when bending over. He also gets intermittent chest discomfort, but not necessarily with exertion. As mentioned his most recent echo shows that LV function has normalized. He was supposed to be on an additional inhaler for COPD and continues to smoke. I do not believe he is using that inhaler because he said he "lost the coupon" to help with the co-pay cost.  ROS: All systems negative except as listed in HPI, PMH and Problem List.  Past Medical History  Diagnosis Date  . Hyperlipidemia   . H/O blood clots 2012    in leg  . Hypertension     takes Losartan and Coreg daily  . CHF (congestive heart failure) (HCC)     takes  Furosemide daily  . Constipation     takes Colace daily  . Muscle spasm     takes Zizanidine daily as needed  . COPD (chronic obstructive pulmonary disease) (HCC)     Spiriva daily  . Shortness of breath dyspnea     more with exertion but sometimes notices with lying/sitting  . Headache     occasionally  . Dizziness   . Weakness     tingling and numbness  . Chronic back pain   . Depression     takes Effexor daily and Risperdal nightly  . Sleep apnea     Current Outpatient Prescriptions  Medication Sig Dispense Refill  . albuterol (PROVENTIL HFA;VENTOLIN HFA) 108 (90 BASE) MCG/ACT inhaler Inhale 2 puffs into the lungs every 6 (six) hours as needed for wheezing or shortness of breath. 1 Inhaler 2  . carvedilol (COREG) 6.25 MG tablet Take 1 tablet (6.25 mg total) by mouth 2 (two) times daily. 60 tablet 3  . furosemide (LASIX) 40 MG tablet Take 1 tablet (40 mg total) by mouth daily. 30 tablet 4  . gabapentin (NEURONTIN) 300 MG capsule Take 1 capsule (300 mg total) by mouth 4 (four) times daily. 120 capsule 5  . HYDROcodone-acetaminophen (NORCO) 10-325 MG tablet Take 1 tablet by mouth 3 (three) times daily. 30 tablet 0  . losartan (COZAAR) 100 MG tablet Take 1 tablet (100 mg total) by mouth daily. 30 tablet 6  . methocarbamol (ROBAXIN) 500 MG tablet Take 1 tablet (500 mg total) by mouth 3 (three) times daily as needed for muscle spasms. 60 tablet 0  . oxyCODONE-acetaminophen (PERCOCET) 10-325 MG per tablet Take 1 tablet by mouth every 4 (four) hours as needed for pain. 60 tablet 0  . risperiDONE (RISPERDAL) 3 MG tablet Take 1 tablet by mouth at bedtime.  0  . traMADol (ULTRAM) 50 MG tablet Take 1 tablet (50 mg total) by mouth every 6 (six) hours as needed. 120 tablet 5   No current facility-administered medications for this visit.    Filed Vitals:   02/11/15 1458  BP: 128/92  Pulse: 62  Height: 6\' 2"  (1.88 m)  Weight: 186 lb (84.369 kg)   PHYSICAL EXAM: GEN: Awake, NAD HEENT:  PERRLA, EOMI, no JVD Lungs: Clear bilaterally Cardiac: Regular rate and rhythm, no murmurs or rubs Abdomen: Soft, scaphoid, nontender Extremities no edema Neurologic: Grossly nonfocal Psych: Mildly anxious   ASSESSMENT:  Patient Active Problem List   Diagnosis Date Noted  . Back pain 09/03/2014  . Spinal stenosis in cervical region 04/21/2014  . Cervical disc disorder with radiculopathy of cervical region 04/21/2014  . COPD (chronic obstructive pulmonary disease) ([place]) 04/18/2014  . Right knee buckling 04/18/2014  . Chronic low back  pain 03/17/2014  . Myofascial pain syndrome 01/20/2014  . Anxiety and depression 11/04/2013  . PTSD (post-traumatic stress disorder) 11/04/2013  . Neuropathic pain 09/12/2013  . Healthcare maintenance 09/12/2013  . OSA (obstructive sleep apnea) 03/26/2013  . HTN (hypertension) 01/01/2013  . Chest pain 11/16/2012  . Current smoker 09/05/2012  . Chronic systolic heart failure ([place]) 05/08/2012  . Polysubstance abuse 05/08/2012  . Alcohol abuse 05/01/2012  . Cocaine abuse 05/01/2012   PLAN:  1.  Charles Cervantes is reporting intermittent shortness of breath and chest discomfort. I suspect there is still ongoing substance abuse although he denies it. He is a smoker. He does have COPD and was supposed to be on an additional inhaler with albuterol but according to Dr. Nira Retort notes he was noncompliant. He recommended using the albuterol as needed. I do think this would help with his shortness of breath. He may need to follow-up with his pulmonologist. He appears euvolemic on exam and LV function as mentioned has recovered. He is still on Lasix which is questionable if he continues to even need it.  Follow-up in 6 months.  Pixie Casino, MD, [facility] Attending Cardiologist [facility]  Nadean Corwin Aracelly Tencza 4:53 PM

## 2015-03-18 ENCOUNTER — Other Ambulatory Visit: Payer: Self-pay | Admitting: Internal Medicine

## 2015-03-18 ENCOUNTER — Other Ambulatory Visit: Payer: Self-pay | Admitting: Physical Medicine & Rehabilitation

## 2015-03-18 NOTE — Telephone Encounter (Signed)
Rx(s) sent to pharmacy electronically.  

## 2015-03-18 NOTE — Telephone Encounter (Signed)
Pharmacy requested refill on tramadol. (I rejected it for being too soon) See [place] report placed on cart. He has been getting hydrocodone from other providers.  Please advise

## 2015-03-19 NOTE — Telephone Encounter (Signed)
We'll discharge

## 2015-03-25 NOTE — Telephone Encounter (Signed)
Letter written.  It looks like he is being seen at [facility].  I am surprised they did not question the tramadol from our clinic.

## 2015-04-13 ENCOUNTER — Other Ambulatory Visit: Payer: Self-pay | Admitting: Internal Medicine

## 2015-04-13 NOTE — Telephone Encounter (Signed)
REFILL 

## 2015-05-08 ENCOUNTER — Other Ambulatory Visit: Payer: Self-pay | Admitting: Physical Medicine & Rehabilitation

## 2015-05-13 ENCOUNTER — Other Ambulatory Visit: Payer: Self-pay | Admitting: Physical Medicine & Rehabilitation

## 2015-05-15 ENCOUNTER — Ambulatory Visit: Payer: Self-pay | Admitting: Registered Nurse

## 2015-07-08 ENCOUNTER — Encounter: Payer: Self-pay | Admitting: Family Medicine

## 2015-07-28 ENCOUNTER — Ambulatory Visit: Payer: Self-pay | Admitting: Family Medicine

## 2015-08-27 ENCOUNTER — Ambulatory Visit: Payer: Medicaid Other | Admitting: Internal Medicine

## 2015-08-27 ENCOUNTER — Encounter: Payer: Self-pay | Admitting: *Deleted

## 2015-08-27 NOTE — Progress Notes (Signed)
Letter mailed

## 2015-11-08 ENCOUNTER — Encounter (HOSPITAL_COMMUNITY): Payer: Self-pay | Admitting: *Deleted

## 2015-11-08 ENCOUNTER — Emergency Department (HOSPITAL_COMMUNITY)
Admission: EM | Admit: 2015-11-08 | Discharge: 2015-11-08 | Disposition: A | Discharge status: Home / Self Care | Payer: Medicaid Other | Attending: Emergency Medicine | Admitting: Emergency Medicine

## 2015-11-08 ENCOUNTER — Emergency Department (HOSPITAL_COMMUNITY): Payer: Medicaid Other

## 2015-11-08 DIAGNOSIS — I110 Hypertensive heart disease with heart failure: Secondary | ICD-10-CM | POA: Diagnosis not present

## 2015-11-08 DIAGNOSIS — F17210 Nicotine dependence, cigarettes, uncomplicated: Secondary | ICD-10-CM | POA: Diagnosis not present

## 2015-11-08 DIAGNOSIS — J449 Chronic obstructive pulmonary disease, unspecified: Secondary | ICD-10-CM | POA: Insufficient documentation

## 2015-11-08 DIAGNOSIS — I5022 Chronic systolic (congestive) heart failure: Secondary | ICD-10-CM | POA: Diagnosis not present

## 2015-11-08 DIAGNOSIS — R0602 Shortness of breath: Secondary | ICD-10-CM | POA: Diagnosis not present

## 2015-11-08 NOTE — ED Provider Notes (Signed)
[place] DEPT Provider Note   CSN: [id number] Arrival date & time: 11/08/15  1329     History   Chief Complaint Chief Complaint  Patient presents with  . Shortness of Breath    HPI Charles Cervantes is a 55 y.o. male.  HPI Charles Cervantes is a 55 y.o. male with history of CHF, chronic pain, COPD, PTSD, presents to ED with complaint of shortness of breath, dizziness, chest tightness. Pt states he has been compliant with all his medications. He reports associated cough. Denies swelling in extremities. Denies weight gain. Has an apt with cardiologist in 1 month. Pt states he feels similar to his prior COPD exacerbation and states he used to get admitted for this in the past when he lived in [place]. States "i feel exactly the same." Pt is followed by [facility] and sees Dr. Debara Pickett, cardiology.   Past Medical History:  Diagnosis Date  . CHF (congestive heart failure) (HCC)    takes Furosemide daily  . Chronic back pain   . Constipation    takes Colace daily  . COPD (chronic obstructive pulmonary disease) (HCC)    Spiriva daily  . Depression    takes Effexor daily and Risperdal nightly  . Dizziness   . H/O blood clots 2012   in leg  . Headache    occasionally  . Hyperlipidemia   . Hypertension    takes Losartan and Coreg daily  . Muscle spasm    takes Zizanidine daily as needed  . Shortness of breath dyspnea    more with exertion but sometimes notices with lying/sitting  . Sleep apnea   . Weakness    tingling and numbness    Patient Active Problem List   Diagnosis Date Noted  . Back pain 09/03/2014  . Spinal stenosis in cervical region 04/21/2014  . Cervical disc disorder with radiculopathy of cervical region 04/21/2014  . COPD (chronic obstructive pulmonary disease) ([place]) 04/18/2014  . Right knee buckling 04/18/2014  . Chronic low back pain 03/17/2014  . Myofascial pain syndrome 01/20/2014  . Anxiety and depression 11/04/2013  . PTSD (post-traumatic stress disorder)  11/04/2013  . Neuropathic pain 09/12/2013  . Healthcare maintenance 09/12/2013  . OSA (obstructive sleep apnea) 03/26/2013  . HTN (hypertension) 01/01/2013  . Chest pain 11/16/2012  . Current smoker 09/05/2012  . Chronic systolic heart failure ([place]) 05/08/2012  . Polysubstance abuse 05/08/2012  . Alcohol abuse 05/01/2012  . Cocaine abuse 05/01/2012    Past Surgical History:  Procedure Laterality Date  . LEFT AND RIGHT HEART CATHETERIZATION WITH CORONARY ANGIOGRAM N/A 11/20/2012   Procedure: LEFT AND RIGHT HEART CATHETERIZATION WITH CORONARY ANGIOGRAM;  Surgeon: Jolaine Artist, MD;  Location: [facility] CATH LAB;  Service: Cardiovascular;  Laterality: N/A;  . Left Second finger surgery    . LUMBAR FUSION  09/05/2014   L5 S1    OB History    No data available       Home Medications    Prior to Admission medications   Medication Sig Start Date End Date Taking? Authorizing Provider  albuterol (PROVENTIL HFA;VENTOLIN HFA) 108 (90 BASE) MCG/ACT inhaler Inhale 2 puffs into the lungs every 6 (six) hours as needed for wheezing or shortness of breath. 04/18/14   Vivi Barrack, MD  carvedilol (COREG) 6.25 MG tablet Take 1 tablet (6.25 mg total) by mouth 2 (two) times daily. 04/13/15   Pixie Casino, MD  furosemide (LASIX) 40 MG tablet Take 1 tablet (40 mg total) by  mouth daily. 08/14/14   Pixie Casino, MD  gabapentin (NEURONTIN) 300 MG capsule Take 1 capsule (300 mg total) by mouth 4 (four) times daily. 03/18/15   Charlett Blake, MD  HYDROcodone-acetaminophen (NORCO) 10-325 MG tablet Take 1 tablet by mouth 3 (three) times daily. 10/07/14   Vivi Barrack, MD  losartan (COZAAR) 100 MG tablet TAKE 1 TABLET BY MOUTH EVERY DAY 03/18/15   Pixie Casino, MD  methocarbamol (ROBAXIN) 500 MG tablet Take 1 tablet (500 mg total) by mouth 3 (three) times daily as needed for muscle spasms. 09/03/14   Melina Schools, MD  oxyCODONE-acetaminophen (PERCOCET) 10-325 MG per tablet Take 1 tablet by mouth  every 4 (four) hours as needed for pain. 09/03/14   Melina Schools, MD  risperiDONE (RISPERDAL) 3 MG tablet Take 1 tablet by mouth at bedtime. 04/21/14   Historical Provider, MD  traMADol (ULTRAM) 50 MG tablet Take 1 tablet (50 mg total) by mouth every 6 (six) hours as needed. 11/17/14   Charlett Blake, MD    Family History History reviewed. No pertinent family history.  Social History Social History  Substance Use Topics  . Smoking status: Current Some Day Smoker    Packs/day: 0.25    Years: 20.00    Types: Cigarettes  . Smokeless tobacco: Never Used     Comment: 1-2 cigs per day (08/15/14)  . Alcohol use No     Comment: stopped in april 2014     Allergies   Ace inhibitors   Review of Systems Review of Systems  Constitutional: Negative for chills and fever.  Respiratory: Positive for cough and shortness of breath. Negative for chest tightness.   Cardiovascular: Positive for chest pain. Negative for palpitations and leg swelling.  Gastrointestinal: Negative for abdominal distention, abdominal pain, diarrhea, nausea and vomiting.  Genitourinary: Negative for dysuria, frequency, hematuria and urgency.  Musculoskeletal: Negative for arthralgias, myalgias, neck pain and neck stiffness.  Skin: Negative for rash.  Allergic/Immunologic: Negative for immunocompromised state.  Neurological: Positive for dizziness and light-headedness. Negative for weakness, numbness and headaches.  All other systems reviewed and are negative.    Physical Exam Updated Vital Signs BP (!) 176/116 (BP Location: Left Arm)   Pulse 67   Temp 98.4 F (36.9 C) (Oral)   Resp 18   Wt 77.5 kg   SpO2 100%   BMI 21.93 kg/m   Physical Exam  Constitutional: He appears well-developed and well-nourished. No distress.  HENT:  Head: Normocephalic and atraumatic.  Eyes: Conjunctivae are normal.  Neck: Neck supple.  Cardiovascular: Normal rate, regular rhythm and normal heart sounds.   Pulmonary/Chest:  Effort normal. No respiratory distress. He has no wheezes. He has no rales.  Abdominal: Soft. Bowel sounds are normal. He exhibits no distension. There is no tenderness. There is no rebound.  Musculoskeletal: He exhibits no edema.  Neurological: He is alert.  Skin: Skin is warm and dry.  Nursing note and vitals reviewed.    ED Treatments / Results  Labs (all labs ordered are listed, but only abnormal results are displayed) Labs Reviewed  [place], ED    EKG  EKG Interpretation  Date/Time:  Sunday November 08 2015 13:34:23 EST Ventricular Rate:  69 PR Interval:  132 QRS Duration: 98 QT Interval:  404 QTC Calculation: 432 R Axis:   46 Text Interpretation:  Normal sinus rhythm Left ventricular hypertrophy Cannot rule out Septal infarct , age undetermined T wave abnormality,  consider inferior ischemia Abnormal ECG No significant change since last tracing Confirmed by [facility] MD, JULIE ([id number]) on 11/08/2015 2:24:49 PM       Radiology Dg Chest 2 View  Result Date: 11/08/2015 CLINICAL DATA:  Short of breath.  Dizziness and light headedness. EXAM: CHEST  2 VIEW COMPARISON:  06/29/2012 FINDINGS: Midline trachea. Normal heart size. Mildly prominent transverse aorta, similar. No pleural effusion or pneumothorax. Bilateral nipple shadows are similar. Clear lungs. IMPRESSION: No acute cardiopulmonary disease. Electronically Signed   By: Abigail Miyamoto M.D.   On: 11/08/2015 14:17    Procedures Procedures (including critical care time)  Medications Ordered in ED Medications - No data to display   Initial Impression / Assessment and Plan / ED Course  I have reviewed the triage vital signs and the nursing notes.  Pertinent labs & imaging results that were available during my care of the patient were reviewed by me and considered in my medical decision making (see chart for details).  Clinical Course     Pt in ED with SOB, dizziness, cough, chest  pain. Pt has been in ED for an hour. He refused blood work twice. He did have CXR prior to me seeing him which was normal. I discussed at length with pt importance of blood work to [facility] ACS and CHF. I explained to him his blood pressure is high and his ECG is not completely normal. He stated that he understood but did not want to give blood. I asked his reasoning for that and he would not elaborate. He thanked [place] for seeing him and doing CXR and stated he would like to leave. Pt was discharged AMA. I did discuss with him the fact that I did not ro acs and he is very hypertensive and I had no explanation for his symptoms and that his symptoms may worsen and may even result in death if a life threatening condition was missed. Pt voiced understanding. Pt is alert and oriented and able to make rational decisions based on my evaluation.   Vitals:   11/08/15 1336 11/08/15 1344  BP: (!) 176/116   Pulse: 67   Resp: 18   Temp: 98.4 F (36.9 C)   TempSrc: Oral   SpO2: 100%   Weight:  77.5 kg     Final Clinical Impressions(s) / ED Diagnoses   Final diagnoses:  Shortness of breath    New Prescriptions New Prescriptions   No medications on file     Jeannett Senior, PA-C 11/08/15 1447    Isla Pence, MD 11/08/15 1505

## 2015-11-08 NOTE — ED Notes (Signed)
Patient refused further treatment and testing, left AMA.

## 2015-11-08 NOTE — Discharge Instructions (Signed)
You have refused blood work today. Your chest xray is normal. Please continue all your medications and follow up with your cardiologist. Please return if worsening symptoms.

## 2015-11-08 NOTE — ED Triage Notes (Signed)
Pt reports hx of chf, takes all meds as prescribed but reports increase in sob x 2 days. Sob and fatigue with mild exertion, denies leg swelling. Airway intact at triage and ekg done. Pt refuses blood work at triage.

## 2015-12-04 ENCOUNTER — Ambulatory Visit (INDEPENDENT_AMBULATORY_CARE_PROVIDER_SITE_OTHER): Payer: Medicaid Other | Admitting: Internal Medicine

## 2015-12-04 ENCOUNTER — Encounter: Payer: Self-pay | Admitting: Internal Medicine

## 2015-12-04 VITALS — BP 160/108 | HR 60 | Ht 73.5 in | Wt 171.2 lb

## 2015-12-04 DIAGNOSIS — I428 Other cardiomyopathies: Secondary | ICD-10-CM | POA: Insufficient documentation

## 2015-12-04 DIAGNOSIS — F1910 Other psychoactive substance abuse, uncomplicated: Secondary | ICD-10-CM | POA: Diagnosis not present

## 2015-12-04 DIAGNOSIS — I10 Essential (primary) hypertension: Secondary | ICD-10-CM | POA: Diagnosis not present

## 2015-12-04 NOTE — Patient Instructions (Signed)
Your physician wants you to follow-up in: ONE YEAR WITH DR HILTY You will receive a reminder letter in the mail two months in advance. If you don't receive a letter, please call our office to schedule the follow-up appointment.  If you need a refill on your cardiac medications before your next appointment, please call your pharmacy.   

## 2015-12-04 NOTE — Progress Notes (Signed)
Patient ID: Charles Cervantes, male   DOB: 02/22/1960, 55 y.o.   MRN: [id number] Guide IT Study [facility]CP: [facility] Pulmonology: Dr Elsworth Soho  CC: Short of breath and intermittent chest pain  HPI: [name] is a 55 yo AAM diagnosed with systolic heart failure in 04/2012 in [place] secondary to NICM (normal cors on cath), EF 20-25%. As well as polysubstance abuse (cocaine, tobacco and alcohol)--> quit in 04/2012.   He moved from [facility] and was admitted to [facility] on 05/01/12 with progressive dyspnea and orthopnea.  Echo on admit showed EF 20-25% with normal RV.  Discharge weight 147 pounds.  He also had NSVT while in house and we continued his Lifevest placed in [place].  Admitted To [facility] in 8/14 with HF. EF reported 50-55% on echo. On 10/2012 bedside echo in our HF Clinic EF 35-40%. In 11/2012 presented to clinic c/o recurrent CP and severe HF symptoms but seemed well compensated on exam R/L cath performed as below.  I saw [name] today in the office in follow-up. He was last seen in March in the heart failure clinic but was dismissed from that clinic due to confrontational behavior with the staff.  He is noted some progressive shortness of breath over the past couple of months. He does have Lasix which she takes only as needed, but is noted some increased abdominal girth and weight gain. He denies any leg swelling and is not more short of breath laying down but has had some progressive shortness of breath. He does have COPD and was supposed to start on Spiriva, but cannot figure out how to administer the medications to himself. He says that he wants to get home health care arranged and that he would need me to sign off on that.  ECHO  04/2012 EF 20-25% 07/17/12 EF 30-35% 08/2012 EF 50-55% at [facility] ECHO 02/27/13 EF 35-40%  RHC/LHC  11/20/12  RA = 6  RV = 33/3/8  PA = 29/9 (18)  PCW = 8  Fick cardiac output/index = 6.4/3.2  PVR = 1.5 WU  FA sat = 95%  PA sat = 72%, 73%  SVC sat  72% Extensive calcification in proximal and mid LAD with only mild intraluminal stenosis with 20-30% stenosis.    CPX 2/15 FVC 2.90 (62%)  FEV1 2.22 (60%) FEV1/FVC 76%  Resting HR: 61 Peak HR: 111 (66% age predicted max HR) BP rest: 146/90 BP peak: 168/86 Peak VO2: 22.6 (64.1% predicted peak VO2) VE/VCO2 slope: 29.4 OUES: 2.49 Peak RER: 0.90  Labs 06/29/12 Potassium 5.4 Creatinine 1.05 07/17/12 Dig Level 0.5 Potassium 4.0 Creatinine 0.95 Pro BNP 244 11/20/12 K 4.0 Creatinine 1.18 12/18/12: K 4.7, Cr 0.96 05/16/13 K 4.5 Creatinine 0.91  [name] returns today for follow-up. We repeated his echocardiogram which shows a normalized EF of 55-60%. He reports his breathing has improved. He was asked to take Lasix on a daily basis but is not clear if that is currently what he is doing. He also has run out of losartan and we did get prior authorization for that. This could explain why his blood pressure is elevated today. Medication noncompliance is clearly an issue.  [name] returns today for follow-up. He's been compliant with his medications as far as I can tell. He followed up with Dr. Elsworth Soho for sleep study and was found to have sleep apnea. He's been ordered equipment but he says he does not know how to fit it or set it up. He  is describing some shortness of breath, mostly when bending over. He also gets intermittent chest discomfort, but not necessarily with exertion. As mentioned his most recent echo shows that LV function has normalized. He was supposed to be on an additional inhaler for COPD and continues to smoke. I do not believe he is using that inhaler because he said he "lost the coupon" to help with the co-pay cost.  12/04/15  [name] returns today for follow-up. Overall he feels well. He denies any chest pain or worsening shortness of breath. He reports compliance with his medications however blood pressure is markedly elevated today 160/108. A recheck did come down to 140/99. He is  mostly concerned about having me fill out a piece of paper indicating he is competent mentally to pay his bills. He is insistent on me doing this as I am the physician who certified his disability, but I mentioned that I am not that physician. I believe this was initiated by either the heart failure clinic prior to him seeing me or his primary care provider with the resident clinic. His weight is down and he does not appear volume overloaded today. He is taking Lasix as needed.  ROS: All systems negative except as listed in HPI, PMH and Problem List.  Past Medical History:  Diagnosis Date  . CHF (congestive heart failure) (HCC)    takes Furosemide daily  . Chronic back pain   . Constipation    takes Colace daily  . COPD (chronic obstructive pulmonary disease) (HCC)    Spiriva daily  . Depression    takes Effexor daily and Risperdal nightly  . Dizziness   . H/O blood clots 2012   in leg  . Headache    occasionally  . Hyperlipidemia   . Hypertension    takes Losartan and Coreg daily  . Muscle spasm    takes Zizanidine daily as needed  . Shortness of breath dyspnea    more with exertion but sometimes notices with lying/sitting  . Sleep apnea   . Weakness    tingling and numbness    Current Outpatient Prescriptions  Medication Sig Dispense Refill  . albuterol (PROVENTIL HFA;VENTOLIN HFA) 108 (90 BASE) MCG/ACT inhaler Inhale 2 puffs into the lungs every 6 (six) hours as needed for wheezing or shortness of breath. 1 Inhaler 2  . carvedilol (COREG) 6.25 MG tablet Take 1 tablet (6.25 mg total) by mouth 2 (two) times daily. 60 tablet 5  . furosemide (LASIX) 40 MG tablet Take 1 tablet (40 mg total) by mouth daily. 30 tablet 4  . gabapentin (NEURONTIN) 300 MG capsule Take 1 capsule (300 mg total) by mouth 4 (four) times daily. 120 capsule 0  . HYDROcodone-acetaminophen (NORCO) 10-325 MG tablet Take 1 tablet by mouth 3 (three) times daily. 30 tablet 0  . losartan (COZAAR) 100 MG tablet  TAKE 1 TABLET BY MOUTH EVERY DAY 30 tablet 10  . methocarbamol (ROBAXIN) 500 MG tablet Take 1 tablet (500 mg total) by mouth 3 (three) times daily as needed for muscle spasms. 60 tablet 0  . oxyCODONE-acetaminophen (PERCOCET) 10-325 MG per tablet Take 1 tablet by mouth every 4 (four) hours as needed for pain. 60 tablet 0  . risperiDONE (RISPERDAL) 3 MG tablet Take 1 tablet by mouth at bedtime.  0  . traMADol (ULTRAM) 50 MG tablet Take 1 tablet (50 mg total) by mouth every 6 (six) hours as needed. 120 tablet 5   No current facility-administered medications for this  visit.     Vitals:   12/04/15 1143  BP: (!) 160/108  Pulse: 60  Weight: 171 lb 3.2 oz (77.7 kg)  Height: 6' 1.5" (1.867 m)   PHYSICAL EXAM: GEN: Awake, NAD HEENT: PERRLA, EOMI, no JVD Lungs: Clear bilaterally Cardiac: Regular rate and rhythm, no murmurs or rubs Abdomen: Soft, scaphoid, nontender Extremities no edema Neurologic: Grossly nonfocal Psych: Mildly anxious   ASSESSMENT:  Patient Active Problem List   Diagnosis Date Noted  . Back pain 09/03/2014  . Spinal stenosis in cervical region 04/21/2014  . Cervical disc disorder with radiculopathy of cervical region 04/21/2014  . COPD (chronic obstructive pulmonary disease) ([place]) 04/18/2014  . Right knee buckling 04/18/2014  . Chronic low back pain 03/17/2014  . Myofascial pain syndrome 01/20/2014  . Anxiety and depression 11/04/2013  . PTSD (post-traumatic stress disorder) 11/04/2013  . Neuropathic pain 09/12/2013  . Healthcare maintenance 09/12/2013  . OSA (obstructive sleep apnea) 03/26/2013  . HTN (hypertension) 01/01/2013  . Chest pain 11/16/2012  . Current smoker 09/05/2012  . Chronic systolic heart failure ([place]) 05/08/2012  . Polysubstance abuse 05/08/2012  . Alcohol abuse 05/01/2012  . Cocaine abuse 05/01/2012   PLAN:  1.  Mr. Engman Has no signs of volume overload or heart failure. His EF has normalized. It's questionable whether he is compliant  with his medications as his blood pressure is elevated today. I advised him to check outpatient blood pressures and notify us if the blood pressures continued to be elevated. Could schedule him for hypertension clinic follow-up. I deferred his disability paperwork to his primary care provider.  Follow-up annually.  Pixie Casino, MD, [facility] Attending Cardiologist [place] 2:25 PM

## 2015-12-15 ENCOUNTER — Ambulatory Visit: Payer: Self-pay | Admitting: Family Medicine

## 2015-12-21 ENCOUNTER — Ambulatory Visit: Payer: Self-pay | Admitting: Family Medicine

## 2015-12-30 ENCOUNTER — Ambulatory Visit: Payer: Self-pay | Admitting: Family Medicine

## 2016-01-28 ENCOUNTER — Telehealth: Payer: Self-pay | Admitting: Internal Medicine

## 2016-01-28 NOTE — Telephone Encounter (Signed)
Faxed PA for losartan 100mg  to [facility]

## 2016-02-04 ENCOUNTER — Telehealth: Payer: Self-pay | Admitting: Family Medicine

## 2016-02-04 NOTE — Telephone Encounter (Signed)
Called patient. No answer. Left voicemail stating that it could be possible that he would be discharged from practice if he continues to no show appointments and that I hoped it would not happen to him.  Charles Cervantes. Jerline Pain, [place] Resident PGY-3 02/04/2016 10:41 AM

## 2016-02-08 ENCOUNTER — Encounter: Payer: Self-pay | Admitting: Family Medicine

## 2016-02-08 DIAGNOSIS — Z5329 Procedure and treatment not carried out because of patient's decision for other reasons: Secondary | ICD-10-CM | POA: Insufficient documentation

## 2016-02-08 DIAGNOSIS — Z91199 Patient's noncompliance with other medical treatment and regimen due to unspecified reason: Secondary | ICD-10-CM | POA: Insufficient documentation

## 2016-02-08 NOTE — Telephone Encounter (Signed)
No Show Letter Sent Thanks

## 2016-03-21 ENCOUNTER — Other Ambulatory Visit: Payer: Self-pay | Admitting: Internal Medicine

## 2016-11-12 ENCOUNTER — Other Ambulatory Visit: Payer: Self-pay | Admitting: Internal Medicine

## 2017-01-06 ENCOUNTER — Other Ambulatory Visit: Payer: Self-pay | Admitting: Internal Medicine

## 2017-01-24 ENCOUNTER — Telehealth: Payer: Self-pay | Admitting: Internal Medicine

## 2017-01-24 ENCOUNTER — Encounter: Payer: Self-pay | Admitting: Adult Health

## 2017-01-24 NOTE — Telephone Encounter (Signed)
New message  Pt verbalized that he is calling for the RN   Pt verbalized that he feels numbness on his right side

## 2017-01-24 NOTE — Telephone Encounter (Signed)
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 He went to an out of town ED yesterday for his BP/symptoms and reports he was r/o for stroke. His BP was 159/120 and then 150/116 in ED. He had been off his losartan & coreg for 5-6 days prior to this incident. He states he has refills of his medications now. He c/o weakness & tingling in his neck and cramping on right side. He has no way to check his BP now that he is home. Scheduled patient for office visit with Arnold Long, DNP on 1/23 @ 1130. Advised patient to seek ED eval for CP, SOB and reviewed stroke-like symptoms with him and advised to seek ED eval should these develop.   Routed to MD as FYI/further advice if any

## 2017-01-24 NOTE — Telephone Encounter (Signed)
Agree - needs to take meds.  Dr.H

## 2017-01-25 ENCOUNTER — Ambulatory Visit: Payer: Medicaid Other | Admitting: Adult Health

## 2017-01-25 NOTE — Progress Notes (Deleted)
Cardiology Office Note   Date:  01/25/2017   ID:  SHERWOOD CASTILLA, DOB 08-Mar-1960, MRN [id number]  PCP:  Eloise Levels, MD  Cardiologist: Dr. Debara Pickett No chief complaint on file.    History of Present Illness: Charles Cervantes is a 57 y.o. male who presents for ongoing assessment and management of systolic heart failure, nonischemic cardiomyopathy with normal course, most recent ejection fraction 20% to 25% as well as COPD, polysubstance abuse to include cocaine tobacco and alcohol- the patient reportedly quit in April 2014.  Patient was previously living in [facility], and had also been followed by [facility].  He was last seen by Dr. Debara Pickett on 12/04/2015  During last office visit, she was medically compliant and asymptomatic however he was found to be hypertensive.  This was rechecked after being evaluated in the clinic and it normalized to 140/99.  He did not appear volume overloaded at the time.  The patient called our office on 01/24/2017 with complaints of elevated blood pressure, having been seen in the emergency room for this complaint.  He had not been taking losartan and Coreg for approximately 5-6 days.  He has some complaints of chest discomfort.  He is here for post ED follow-up.  Past Medical History:  Diagnosis Date  . CHF (congestive heart failure) (HCC)    takes Furosemide daily  . Chronic back pain   . Constipation    takes Colace daily  . COPD (chronic obstructive pulmonary disease) (HCC)    Spiriva daily  . Depression    takes Effexor daily and Risperdal nightly  . Dizziness   . H/O blood clots 2012   in leg  . Headache    occasionally  . Hyperlipidemia   . Hypertension    takes Losartan and Coreg daily  . Muscle spasm    takes Zizanidine daily as needed  . Shortness of breath dyspnea    more with exertion but sometimes notices with lying/sitting  . Sleep apnea   . Weakness    tingling and numbness    Past Surgical History:    Procedure Laterality Date  . LEFT AND RIGHT HEART CATHETERIZATION WITH CORONARY ANGIOGRAM N/A 11/20/2012   Procedure: LEFT AND RIGHT HEART CATHETERIZATION WITH CORONARY ANGIOGRAM;  Surgeon: Jolaine Artist, MD;  Location: [facility] CATH LAB;  Service: Cardiovascular;  Laterality: N/A;  . Left Second finger surgery    . LUMBAR FUSION  09/05/2014   L5 S1     Current Outpatient Medications  Medication Sig Dispense Refill  . albuterol (PROVENTIL HFA;VENTOLIN HFA) 108 (90 BASE) MCG/ACT inhaler Inhale 2 puffs into the lungs every 6 (six) hours as needed for wheezing or shortness of breath. 1 Inhaler 2  . carvedilol (COREG) 6.25 MG tablet Take 1 tablet (6.25 mg total) by mouth 2 (two) times daily. 15 tablet 0  . furosemide (LASIX) 40 MG tablet Take 1 tablet (40 mg total) by mouth daily. 30 tablet 4  . gabapentin (NEURONTIN) 300 MG capsule Take 1 capsule (300 mg total) by mouth 4 (four) times daily. 120 capsule 0  . HYDROcodone-acetaminophen (NORCO) 10-325 MG tablet Take 1 tablet by mouth 3 (three) times daily. 30 tablet 0  . losartan (COZAAR) 100 MG tablet TAKE 1 TABLET BY MOUTH EVERY DAY 30 tablet 10  . methocarbamol (ROBAXIN) 500 MG tablet Take 1 tablet (500 mg total) by mouth 3 (three) times daily as needed for muscle spasms. 60 tablet 0  . oxyCODONE-acetaminophen (PERCOCET)  10-325 MG per tablet Take 1 tablet by mouth every 4 (four) hours as needed for pain. 60 tablet 0  . risperiDONE (RISPERDAL) 3 MG tablet Take 1 tablet by mouth at bedtime.  0  . traMADol (ULTRAM) 50 MG tablet Take 1 tablet (50 mg total) by mouth every 6 (six) hours as needed. 120 tablet 5   No current facility-administered medications for this visit.     Allergies:   Ace inhibitors    Social History:  The patient  reports that he has been smoking cigarettes.  He has a 5.00 pack-year smoking history. he has never used smokeless tobacco. He reports that he uses drugs. He reports that he does not drink alcohol.   Family  History:  The patient's family history is not on file.    ROS: All other systems are reviewed and negative. Unless otherwise mentioned in H&P    PHYSICAL EXAM: VS:  There were no vitals taken for this visit. , BMI There is no height or weight on file to calculate BMI. GEN: Well nourished, well developed, in no acute distress HEENT: normal Neck: no JVD, carotid bruits, or masses Cardiac: ***RRR; no murmurs, rubs, or gallops,no edema  Respiratory:  clear to auscultation bilaterally, normal work of breathing GI: soft, nontender, nondistended, + BS MS: no deformity or atrophy Skin: warm and dry, no rash Neuro:  Strength and sensation are intact Psych: euthymic mood, full affect   EKG:  EKG {ACTION; IS/IS [id number]} ordered today. The ekg ordered today demonstrates ***   Recent Labs: No results found for requested labs within last 8760 hours.    Lipid Panel    Component Value Date/Time   CHOL 195 04/18/2014 1532   TRIG 215 (H) 04/18/2014 1532   HDL 46 04/18/2014 1532   CHOLHDL 4.2 04/18/2014 1532   VLDL 43 (H) 04/18/2014 1532   LDLCALC 106 (H) 04/18/2014 1532      Wt Readings from Last 3 Encounters:  12/04/15 171 lb 3.2 oz (77.7 kg)  11/08/15 170 lb 12.8 oz (77.5 kg)  02/11/15 186 lb (84.4 kg)      Other studies Reviewed: ECHO  04/2012 EF 20-25% 07/17/12 EF 30-35% 08/2012 EF 50-55% at [facility] ECHO 02/27/13 EF 35-40%  RHC/LHC  11/20/12  RA = 6  RV = 33/3/8  PA = 29/9 (18)  PCW = 8  Fick cardiac output/index = 6.4/3.2  PVR = 1.5 WU  FA sat = 95%  PA sat = 72%, 73%  SVC sat 72% Extensive calcification in proximal and mid LAD with only mild intraluminal stenosis with 20-30% stenosis.    CPX 2/15 FVC 2.90 (62%)  FEV1 2.22 (60%) FEV1/FVC 76%  Resting HR: 61 Peak HR: 111 (66% age predicted max HR) BP rest: 146/90 BP peak: 168/86 Peak VO2: 22.6 (64.1% predicted peak VO2) VE/VCO2 slope: 29.4 OUES: 2.49 Peak RER: 0.90    ASSESSMENT AND PLAN:  1.   ***   Current medicines are reviewed at length with the patient today.    Labs/ tests ordered today include: *** Phill Myron. West Pugh, ANP, AACC   01/25/2017 7:34 AM    [facility] [address], [place], [place] [address] Phone: [phone]; Fax: [phone]

## 2017-01-26 ENCOUNTER — Encounter: Payer: Self-pay | Admitting: *Deleted

## 2017-02-09 ENCOUNTER — Ambulatory Visit: Payer: Medicaid Other | Admitting: Adult Health

## 2017-02-09 NOTE — Progress Notes (Deleted)
Cardiology Office Note   Date:  02/09/2017   ID:  BETH SPACKMAN, DOB 1960/06/26, MRN [id number]  PCP:  Eloise Levels, MD  Cardiologist: Dr.  Debara Pickett No chief complaint on file.    History of Present Illness: Charles Cervantes is a 57 y.o. male who presents for ongoing assessment and management of systolic heart failure with EF of 20% to 25%, history of polysubstance abuse with cocaine, tobacco and alcohol.  Was last seen by Dr. Claiborne Billings on 12/04/2015, (had been seen by heart failure clinic but was dismissed due to the confrontational behavior).  He was last seen by Dr. Claiborne Billings on 12/04/2015 was continuing to have some dyspnea on exertion associated with COPD but did not have evidence of lower extremity edema or decompensation.   On last office visit, the patient was without complaints.  Blood pressure was elevated initially but came down after recheck in the clinic.  Disability paperwork was deferred to his primary care provider.  Past Medical History:  Diagnosis Date  . CHF (congestive heart failure) (HCC)    takes Furosemide daily  . Chronic back pain   . Constipation    takes Colace daily  . COPD (chronic obstructive pulmonary disease) (HCC)    Spiriva daily  . Depression    takes Effexor daily and Risperdal nightly  . Dizziness   . H/O blood clots 2012   in leg  . Headache    occasionally  . Hyperlipidemia   . Hypertension    takes Losartan and Coreg daily  . Muscle spasm    takes Zizanidine daily as needed  . Shortness of breath dyspnea    more with exertion but sometimes notices with lying/sitting  . Sleep apnea   . Weakness    tingling and numbness    Past Surgical History:  Procedure Laterality Date  . LEFT AND RIGHT HEART CATHETERIZATION WITH CORONARY ANGIOGRAM N/A 11/20/2012   Procedure: LEFT AND RIGHT HEART CATHETERIZATION WITH CORONARY ANGIOGRAM;  Surgeon: Jolaine Artist, MD;  Location: [facility] CATH LAB;  Service: Cardiovascular;  Laterality: N/A;  . Left Second finger  surgery    . LUMBAR FUSION  09/05/2014   L5 S1     Current Outpatient Medications  Medication Sig Dispense Refill  . albuterol (PROVENTIL HFA;VENTOLIN HFA) 108 (90 BASE) MCG/ACT inhaler Inhale 2 puffs into the lungs every 6 (six) hours as needed for wheezing or shortness of breath. 1 Inhaler 2  . carvedilol (COREG) 6.25 MG tablet Take 1 tablet (6.25 mg total) by mouth 2 (two) times daily. 15 tablet 0  . furosemide (LASIX) 40 MG tablet Take 1 tablet (40 mg total) by mouth daily. 30 tablet 4  . gabapentin (NEURONTIN) 300 MG capsule Take 1 capsule (300 mg total) by mouth 4 (four) times daily. 120 capsule 0  . HYDROcodone-acetaminophen (NORCO) 10-325 MG tablet Take 1 tablet by mouth 3 (three) times daily. 30 tablet 0  . losartan (COZAAR) 100 MG tablet TAKE 1 TABLET BY MOUTH EVERY DAY 30 tablet 10  . methocarbamol (ROBAXIN) 500 MG tablet Take 1 tablet (500 mg total) by mouth 3 (three) times daily as needed for muscle spasms. 60 tablet 0  . oxyCODONE-acetaminophen (PERCOCET) 10-325 MG per tablet Take 1 tablet by mouth every 4 (four) hours as needed for pain. 60 tablet 0  . risperiDONE (RISPERDAL) 3 MG tablet Take 1 tablet by mouth at bedtime.  0  . traMADol (ULTRAM) 50 MG tablet Take 1 tablet (50 mg total) by  mouth every 6 (six) hours as needed. 120 tablet 5   No current facility-administered medications for this visit.     Allergies:   Ace inhibitors    Social History:  The patient  reports that he has been smoking cigarettes.  He has a 5.00 pack-year smoking history. he has never used smokeless tobacco. He reports that he uses drugs. He reports that he does not drink alcohol.   Family History:  The patient's family history is not on file.    ROS: All other systems are reviewed and negative. Unless otherwise mentioned in H&P    PHYSICAL EXAM: VS:  There were no vitals taken for this visit. , BMI There is no height or weight on file to calculate BMI. GEN: Well nourished, well developed,  in no acute distress  HEENT: normal  Neck: no JVD, carotid bruits, or masses Cardiac: ***RRR; no murmurs, rubs, or gallops,no edema  Respiratory:  clear to auscultation bilaterally, normal work of breathing GI: soft, nontender, nondistended, + BS MS: no deformity or atrophy  Skin: warm and dry, no rash Neuro:  Strength and sensation are intact Psych: euthymic mood, full affect   EKG:  EKG {ACTION; IS/IS [id number]} ordered today. The ekg ordered today demonstrates ***   Recent Labs: No results found for requested labs within last 8760 hours.    Lipid Panel    Component Value Date/Time   CHOL 195 04/18/2014 1532   TRIG 215 (H) 04/18/2014 1532   HDL 46 04/18/2014 1532   CHOLHDL 4.2 04/18/2014 1532   VLDL 43 (H) 04/18/2014 1532   LDLCALC 106 (H) 04/18/2014 1532      Wt Readings from Last 3 Encounters:  12/04/15 171 lb 3.2 oz (77.7 kg)  11/08/15 170 lb 12.8 oz (77.5 kg)  02/11/15 186 lb (84.4 kg)      Other studies Reviewed: Echocardiogram 2014/07/04  Left ventricle: The cavity size was normal. There was mild   concentric hypertrophy. Systolic function was normal. The   estimated ejection fraction was in the range of 55% to 60%. Wall   motion was normal; there were no regional wall motion   abnormalities. Doppler parameters are consistent with abnormal   left ventricular relaxation (grade 1 diastolic dysfunction). - Left atrium: The atrium was mildly dilated. ECHO  04/2012 EF 20-25% 07/17/12 EF 30-35% 08/2012 EF 50-55% at [facility] ECHO 02/27/13 EF 35-40%  RHC/LHC  11/20/12  RA = 6  RV = 33/3/8  PA = 29/9 (18)  PCW = 8  Fick cardiac output/index = 6.4/3.2  PVR = 1.5 WU  FA sat = 95%  PA sat = 72%, 73%  SVC sat 72% Extensive calcification in proximal and mid LAD with only mild intraluminal stenosis with 20-30% stenosis.    CPX 2/15 FVC 2.90 (62%)  FEV1 2.22 (60%) FEV1/FVC 76%  Resting HR: 61 Peak HR: 111 (66% age predicted max HR) BP rest: 146/90 BP  peak: 168/86 Peak VO2: 22.6 (64.1% predicted peak VO2) VE/VCO2 slope: 29.4 OUES: 2.49 Peak RER: 0.90  Labs 06/29/12 Potassium 5.4 Creatinine 1.05 07/17/12 Dig Level 0.5 Potassium 4.0 Creatinine 0.95 Pro BNP 244 11/20/12 K 4.0 Creatinine 1.18 12/18/12: K 4.7, Cr 0.96 05/16/13 K 4.5 Creatinine 0.91    ASSESSMENT AND PLAN:  1.  ***   Current medicines are reviewed at length with the patient today.    Labs/ tests ordered today include: *** Charles Cervantes, ANP, AACC   02/09/2017 7:25 AM    [facility] [address] [place], [place] [address] Phone: [phone]; Fax: [phone]

## 2017-02-10 ENCOUNTER — Encounter: Payer: Self-pay | Admitting: *Deleted

## 2017-02-17 ENCOUNTER — Encounter: Payer: Medicaid Other | Admitting: Family Medicine

## 2017-02-22 ENCOUNTER — Telehealth: Payer: Self-pay | Admitting: Internal Medicine

## 2017-02-22 NOTE — Telephone Encounter (Signed)
°*  STAT* If patient is at the pharmacy, call can be transferred to refill team.   1. Which medications need to be refilled? (please list name of each medication and dose if known) Carvedilol 6.25 mg  2. Which pharmacy/location (including street and city if local pharmacy) is medication to be sent to? [place], [place] - [address]  3. Do they need a 30 day or 90 day supply? [place]

## 2017-02-23 MED ORDER — CARVEDILOL 6.25 MG PO TABS: 6.2500 mg | ORAL_TABLET | Freq: Two times a day (BID) | ORAL | 0 refills | Status: DC

## 2017-03-24 ENCOUNTER — Other Ambulatory Visit: Payer: Self-pay | Admitting: Internal Medicine

## 2017-04-26 ENCOUNTER — Other Ambulatory Visit: Payer: Self-pay | Admitting: Internal Medicine

## 2017-04-26 NOTE — Telephone Encounter (Signed)
Need refill for Carvedilol...but didn't know the mg. Need refill for today

## 2017-05-17 ENCOUNTER — Telehealth: Payer: Self-pay | Admitting: Internal Medicine

## 2017-05-17 NOTE — Telephone Encounter (Signed)
New message    *STAT* If patient is at the pharmacy, call can be transferred to refill team.   1. Which medications need to be refilled? (please list name of each medication and dose if known) carvedilol (COREG) 6.25 MG tablet, furosemide (LASIX) 40 MG tablet, losartan (COZAAR) 100 MG tablet,   2. Which pharmacy/location (including street and city if local pharmacy) is medication to be sent to? [employer] [facility], phone [phone]  3. Do they need a 30 day or 90 day supply? [place]

## 2017-05-18 MED ORDER — CARVEDILOL 6.25 MG PO TABS: 6.2500 mg | ORAL_TABLET | Freq: Two times a day (BID) | ORAL | 0 refills | Status: DC

## 2017-05-19 MED ORDER — FUROSEMIDE 40 MG PO TABS: 40.0000 mg | ORAL_TABLET | Freq: Every day | ORAL | 0 refills | Status: DC

## 2017-05-19 MED ORDER — CARVEDILOL 6.25 MG PO TABS: 6.2500 mg | ORAL_TABLET | Freq: Two times a day (BID) | ORAL | 0 refills | Status: DC

## 2017-05-19 MED ORDER — LOSARTAN POTASSIUM 100 MG PO TABS: 100.0000 mg | ORAL_TABLET | Freq: Every day | ORAL | 0 refills | Status: DC

## 2017-05-19 NOTE — Telephone Encounter (Signed)
Rx request sent to pharmacy. Sent to pharm with only 30 day supply of each. Pt has not been seen since 2017. Put on rx that pt needs to schedule appointment for any refills.

## 2017-05-19 NOTE — Addendum Note (Signed)
Addended by: Therisa Doyne on: 05/19/2017 01:16 PM   Modules accepted: Orders

## 2017-08-11 ENCOUNTER — Other Ambulatory Visit: Payer: Self-pay | Admitting: Internal Medicine

## 2017-08-11 NOTE — Telephone Encounter (Signed)
Rx request sent to pharmacy.  

## 2017-09-12 ENCOUNTER — Other Ambulatory Visit: Payer: Self-pay | Admitting: Internal Medicine

## 2017-09-28 ENCOUNTER — Other Ambulatory Visit: Payer: Self-pay | Admitting: Internal Medicine

## 2017-10-02 NOTE — Progress Notes (Deleted)
Cardiology Office Note   Date:  10/02/2017   ID:  Charles Cervantes, DOB [birth year], MRN [id number]  PCP:  Nuala Alpha, DO  Cardiologist: Dr. Debara Pickett No chief complaint on file.    History of Present Illness: Charles Cervantes is a 57 y.o. male who presents for ongoing assessment and management of chronic systolic CHF, ICM with normal coronaries on cath 2014, EF of 20-25%, HTN,history of polysubstance abuse with cocaine, tobacco and ETOH-cessation in 2014, OSA, and COPD He was admitted in 2014 for progressive dyspnea and was noted to have  NSVT.   He was last seen in the office by Dr.Hillty on 12/04/2015. He was clinically stable and medically compliant. He is here for cardiology follow up and refills.     Past Medical History:  Diagnosis Date  . CHF (congestive heart failure) (HCC)    takes Furosemide daily  . Chronic back pain   . Constipation    takes Colace daily  . COPD (chronic obstructive pulmonary disease) (HCC)    Spiriva daily  . Depression    takes Effexor daily and Risperdal nightly  . Dizziness   . H/O blood clots 2012   in leg  . Headache    occasionally  . Hyperlipidemia   . Hypertension    takes Losartan and Coreg daily  . Muscle spasm    takes Zizanidine daily as needed  . Shortness of breath dyspnea    more with exertion but sometimes notices with lying/sitting  . Sleep apnea   . Weakness    tingling and numbness    Past Surgical History:  Procedure Laterality Date  . LEFT AND RIGHT HEART CATHETERIZATION WITH CORONARY ANGIOGRAM N/A 11/20/2012   Procedure: LEFT AND RIGHT HEART CATHETERIZATION WITH CORONARY ANGIOGRAM;  Surgeon: Jolaine Artist, MD;  Location: [facility] CATH LAB;  Service: Cardiovascular;  Laterality: N/A;  . Left Second finger surgery    . LUMBAR FUSION  09/05/2014   L5 S1     Current Outpatient Medications  Medication Sig Dispense Refill  . albuterol (PROVENTIL HFA;VENTOLIN HFA) 108 (90 BASE) MCG/ACT inhaler Inhale 2 puffs into the  lungs every 6 (six) hours as needed for wheezing or shortness of breath. 1 Inhaler 2  . carvedilol (COREG) 6.25 MG tablet Take 1 tablet (6.25 mg total) by mouth 2 (two) times daily with a meal. NEED OV. 60 tablet 0  . furosemide (LASIX) 40 MG tablet Take 1 tablet (40 mg total) by mouth daily. NEED Office visit. 7 tablet 0  . gabapentin (NEURONTIN) 300 MG capsule Take 1 capsule (300 mg total) by mouth 4 (four) times daily. 120 capsule 0  . HYDROcodone-acetaminophen (NORCO) 10-325 MG tablet Take 1 tablet by mouth 3 (three) times daily. 30 tablet 0  . losartan (COZAAR) 100 MG tablet Take 1 tablet (100 mg total) by mouth daily. Please schedule appointment for refills. 30 tablet 0  . methocarbamol (ROBAXIN) 500 MG tablet Take 1 tablet (500 mg total) by mouth 3 (three) times daily as needed for muscle spasms. 60 tablet 0  . oxyCODONE-acetaminophen (PERCOCET) 10-325 MG per tablet Take 1 tablet by mouth every 4 (four) hours as needed for pain. 60 tablet 0  . risperiDONE (RISPERDAL) 3 MG tablet Take 1 tablet by mouth at bedtime.  0  . traMADol (ULTRAM) 50 MG tablet Take 1 tablet (50 mg total) by mouth every 6 (six) hours as needed. 120 tablet 5   No current facility-administered medications for this visit.  Allergies:   Ace inhibitors    Social History:  The patient  reports that he has been smoking cigarettes. He has a 5.00 pack-year smoking history. He has never used smokeless tobacco. He reports that he has current or past drug history. He reports that he does not drink alcohol.   Family History:  The patient's family history is not on file.    ROS: All other systems are reviewed and negative. Unless otherwise mentioned in H&P    PHYSICAL EXAM: VS:  There were no vitals taken for this visit. , BMI There is no height or weight on file to calculate BMI. GEN: Well nourished, well developed, in no acute distress HEENT: normal Neck: no JVD, carotid bruits, or masses Cardiac: ***RRR; no murmurs,  rubs, or gallops,no edema  Respiratory:  Clear to auscultation bilaterally, normal work of breathing GI: soft, nontender, nondistended, + BS MS: no deformity or atrophy Skin: warm and dry, no rash Neuro:  Strength and sensation are intact Psych: euthymic mood, full affect   EKG:  EKG {ACTION; IS/IS [id number]} ordered today. The ekg ordered today demonstrates ***   Recent Labs: No results found for requested labs within last 8760 hours.    Lipid Panel    Component Value Date/Time   CHOL 195 04/18/2014 1532   TRIG 215 (H) 04/18/2014 1532   HDL 46 04/18/2014 1532   CHOLHDL 4.2 04/18/2014 1532   VLDL 43 (H) 04/18/2014 1532   LDLCALC 106 (H) 04/18/2014 1532      Wt Readings from Last 3 Encounters:  12/04/15 171 lb 3.2 oz (77.7 kg)  11/08/15 170 lb 12.8 oz (77.5 kg)  02/11/15 186 lb (84.4 kg)      Other studies Reviewed: Additional studies/ records that were reviewed today include: ***. Review of the above records demonstrates: ***   ASSESSMENT AND PLAN:  1.  ***   Current medicines are reviewed at length with the patient today.    Labs/ tests ordered today include: *** Phill Myron. West Pugh, ANP, AACC   10/02/2017 9:00 AM    [place] [address] Office [phone] Fax [phone]

## 2017-10-05 ENCOUNTER — Other Ambulatory Visit: Payer: Self-pay | Admitting: Internal Medicine

## 2017-10-05 ENCOUNTER — Ambulatory Visit: Payer: Medicaid Other | Admitting: Adult Health

## 2017-10-06 ENCOUNTER — Encounter: Payer: Self-pay | Admitting: *Deleted

## 2017-10-11 ENCOUNTER — Other Ambulatory Visit: Payer: Self-pay | Admitting: Internal Medicine

## 2017-10-11 NOTE — Progress Notes (Deleted)
Cardiology Office Note   Date:  10/11/2017   ID:  Charles Cervantes, DOB 1960-07-21, MRN [id number]  PCP:  Nuala Alpha, DO  Cardiologist:  Dr. Debara Pickett  No chief complaint on file.    History of Present Illness: Charles Cervantes is a 57 y.o. male who presents for ongoing assessment and management for systolic CHF, NICM EF of 67-34%, normal cor's on recent cath, history of COPD and polysubstance abuse, quit in 2014. During admission in 2014 he had episodes of NSVT.   On last office visit it was noted that he was dismissed from the CHF clinic due to confrontational behavior with the staff. He had LEE, dyspnea, and increased abdominal girth. He was continued on prn lasix. It was questionable whether he was being compliant with his medications. He was referred to PCP for disability paperwork     Past Medical History:  Diagnosis Date  . CHF (congestive heart failure) (HCC)    takes Furosemide daily  . Chronic back pain   . Constipation    takes Colace daily  . COPD (chronic obstructive pulmonary disease) (HCC)    Spiriva daily  . Depression    takes Effexor daily and Risperdal nightly  . Dizziness   . H/O blood clots 2012   in leg  . Headache    occasionally  . Hyperlipidemia   . Hypertension    takes Losartan and Coreg daily  . Muscle spasm    takes Zizanidine daily as needed  . Shortness of breath dyspnea    more with exertion but sometimes notices with lying/sitting  . Sleep apnea   . Weakness    tingling and numbness    Past Surgical History:  Procedure Laterality Date  . LEFT AND RIGHT HEART CATHETERIZATION WITH CORONARY ANGIOGRAM N/A 11/20/2012   Procedure: LEFT AND RIGHT HEART CATHETERIZATION WITH CORONARY ANGIOGRAM;  Surgeon: Jolaine Artist, MD;  Location: [facility] CATH LAB;  Service: Cardiovascular;  Laterality: N/A;  . Left Second finger surgery    . LUMBAR FUSION  09/05/2014   L5 S1     Current Outpatient Medications  Medication Sig Dispense Refill  . albuterol  (PROVENTIL HFA;VENTOLIN HFA) 108 (90 BASE) MCG/ACT inhaler Inhale 2 puffs into the lungs every 6 (six) hours as needed for wheezing or shortness of breath. 1 Inhaler 2  . carvedilol (COREG) 6.25 MG tablet Take 1 tablet (6.25 mg total) by mouth 2 (two) times daily with a meal. NEED OV. 60 tablet 0  . furosemide (LASIX) 40 MG tablet Take 1 tablet (40 mg total) by mouth daily. NEED Office visit. 7 tablet 0  . gabapentin (NEURONTIN) 300 MG capsule Take 1 capsule (300 mg total) by mouth 4 (four) times daily. 120 capsule 0  . HYDROcodone-acetaminophen (NORCO) 10-325 MG tablet Take 1 tablet by mouth 3 (three) times daily. 30 tablet 0  . losartan (COZAAR) 100 MG tablet Take 1 tablet (100 mg total) by mouth daily. Please schedule appointment for refills. 30 tablet 0  . methocarbamol (ROBAXIN) 500 MG tablet Take 1 tablet (500 mg total) by mouth 3 (three) times daily as needed for muscle spasms. 60 tablet 0  . oxyCODONE-acetaminophen (PERCOCET) 10-325 MG per tablet Take 1 tablet by mouth every 4 (four) hours as needed for pain. 60 tablet 0  . risperiDONE (RISPERDAL) 3 MG tablet Take 1 tablet by mouth at bedtime.  0  . traMADol (ULTRAM) 50 MG tablet Take 1 tablet (50 mg total) by mouth every 6 (six) hours  as needed. 120 tablet 5   No current facility-administered medications for this visit.     Allergies:   Ace inhibitors    Social History:  The patient  reports that he has been smoking cigarettes. He has a 5.00 pack-year smoking history. He has never used smokeless tobacco. He reports that he has current or past drug history. He reports that he does not drink alcohol.   Family History:  The patient's family history is not on file.    ROS: All other systems are reviewed and negative. Unless otherwise mentioned in H&P    PHYSICAL EXAM: VS:  There were no vitals taken for this visit. , BMI There is no height or weight on file to calculate BMI. GEN: Well nourished, well developed, in no acute  distress HEENT: normal Neck: no JVD, carotid bruits, or masses Cardiac: ***RRR; no murmurs, rubs, or gallops,no edema  Respiratory:  Clear to auscultation bilaterally, normal work of breathing GI: soft, nontender, nondistended, + BS MS: no deformity or atrophy Skin: warm and dry, no rash Neuro:  Strength and sensation are intact Psych: euthymic mood, full affect   EKG:  EKG {ACTION; IS/IS [id number]} ordered today. The ekg ordered today demonstrates ***   Recent Labs: No results found for requested labs within last 8760 hours.    Lipid Panel    Component Value Date/Time   CHOL 195 04/18/2014 1532   TRIG 215 (H) 04/18/2014 1532   HDL 46 04/18/2014 1532   CHOLHDL 4.2 04/18/2014 1532   VLDL 43 (H) 04/18/2014 1532   LDLCALC 106 (H) 04/18/2014 1532      Wt Readings from Last 3 Encounters:  12/04/15 171 lb 3.2 oz (77.7 kg)  11/08/15 170 lb 12.8 oz (77.5 kg)  02/11/15 186 lb (84.4 kg)      Other studies Reviewed: ECHO  04/2012 EF 20-25% 07/17/12 EF 30-35% 08/2012 EF 50-55% at [facility] ECHO 02/27/13 EF 35-40%  RHC/LHC  11/20/12  RA = 6  RV = 33/3/8  PA = 29/9 (18)  PCW = 8  Fick cardiac output/index = 6.4/3.2  PVR = 1.5 WU  FA sat = 95%  PA sat = 72%, 73%  SVC sat 72% Extensive calcification in proximal and mid LAD with only mild intraluminal stenosis with 20-30% stenosis.    CPX 2/15 FVC 2.90 (62%)  FEV1 2.22 (60%) FEV1/FVC 76%  Resting HR: 61 Peak HR: 111 (66% age predicted Charles HR) BP rest: 146/90 BP peak: 168/86 Peak VO2: 22.6 (64.1% predicted peak VO2) VE/VCO2 slope: 29.4 OUES: 2.49 Peak RER: 0.90  Labs 06/29/12 Potassium 5.4 Creatinine 1.05 07/17/12 Dig Level 0.5 Potassium 4.0 Creatinine 0.95 Pro BNP 244 11/20/12 K 4.0 Creatinine 1.18 12/18/12: K 4.7, Cr 0.96 05/16/13 K 4.5 Creatinine 0.91  ASSESSMENT AND PLAN:  1.  ***   Current medicines are reviewed at length with the patient today.    Labs/ tests ordered today include: *** Phill Myron.  West Pugh, ANP, AACC   10/11/2017 7:39 AM    [place] Office [phone] Fax [phone]

## 2017-10-12 ENCOUNTER — Ambulatory Visit: Payer: Self-pay | Admitting: Adult Health

## 2017-10-16 NOTE — Progress Notes (Deleted)
Cardiology Office Note   Date:  10/16/2017   ID:  Charles Cervantes, DOB [birth year], MRN [id number]  PCP:  Nuala Alpha, DO  Cardiologist:  Dr.Hilty  No chief complaint on file.    History of Present Illness: Charles Cervantes is a 57 y.o. male who presents for ongoing assessment and management of chronic systolic CHF, NICM, EF of 20%-25%, hx of polysubstance abuse (quit 2014). Repeat echocardiogram in 2016 determined EF had normalized to 60%-65%. When last seen by Dr. Debara Pickett, on 12/04/2015 he was stable. He was not volume overloaded. He was continued on current regimen but it was questionable if he was consistently compliant,  Other history includes COPD, Depression, HL, HTN.    Past Medical History:  Diagnosis Date  . CHF (congestive heart failure) (HCC)    takes Furosemide daily  . Chronic back pain   . Constipation    takes Colace daily  . COPD (chronic obstructive pulmonary disease) (HCC)    Spiriva daily  . Depression    takes Effexor daily and Risperdal nightly  . Dizziness   . H/O blood clots 2012   in leg  . Headache    occasionally  . Hyperlipidemia   . Hypertension    takes Losartan and Coreg daily  . Muscle spasm    takes Zizanidine daily as needed  . Shortness of breath dyspnea    more with exertion but sometimes notices with lying/sitting  . Sleep apnea   . Weakness    tingling and numbness    Past Surgical History:  Procedure Laterality Date  . LEFT AND RIGHT HEART CATHETERIZATION WITH CORONARY ANGIOGRAM N/A 11/20/2012   Procedure: LEFT AND RIGHT HEART CATHETERIZATION WITH CORONARY ANGIOGRAM;  Surgeon: Jolaine Artist, MD;  Location: [facility] CATH LAB;  Service: Cardiovascular;  Laterality: N/A;  . Left Second finger surgery    . LUMBAR FUSION  09/05/2014   L5 S1     Current Outpatient Medications  Medication Sig Dispense Refill  . albuterol (PROVENTIL HFA;VENTOLIN HFA) 108 (90 BASE) MCG/ACT inhaler Inhale 2 puffs into the lungs every 6 (six) hours as  needed for wheezing or shortness of breath. 1 Inhaler 2  . carvedilol (COREG) 6.25 MG tablet Take 1 tablet (6.25 mg total) by mouth 2 (two) times daily with a meal. NEED OV. 60 tablet 0  . furosemide (LASIX) 40 MG tablet Take 1 tablet (40 mg total) by mouth daily. NEED Office visit. 30 tablet 0  . gabapentin (NEURONTIN) 300 MG capsule Take 1 capsule (300 mg total) by mouth 4 (four) times daily. 120 capsule 0  . HYDROcodone-acetaminophen (NORCO) 10-325 MG tablet Take 1 tablet by mouth 3 (three) times daily. 30 tablet 0  . losartan (COZAAR) 100 MG tablet Take 1 tablet (100 mg total) by mouth daily. Please schedule appointment for refills. 30 tablet 0  . methocarbamol (ROBAXIN) 500 MG tablet Take 1 tablet (500 mg total) by mouth 3 (three) times daily as needed for muscle spasms. 60 tablet 0  . oxyCODONE-acetaminophen (PERCOCET) 10-325 MG per tablet Take 1 tablet by mouth every 4 (four) hours as needed for pain. 60 tablet 0  . risperiDONE (RISPERDAL) 3 MG tablet Take 1 tablet by mouth at bedtime.  0  . traMADol (ULTRAM) 50 MG tablet Take 1 tablet (50 mg total) by mouth every 6 (six) hours as needed. 120 tablet 5   No current facility-administered medications for this visit.     Allergies:   Ace inhibitors  Social History:  The patient  reports that he has been smoking cigarettes. He has a 5.00 pack-year smoking history. He has never used smokeless tobacco. He reports that he has current or past drug history. He reports that he does not drink alcohol.   Family History:  The patient's family history is not on file.    ROS: All other systems are reviewed and negative. Unless otherwise mentioned in H&P    PHYSICAL EXAM: VS:  There were no vitals taken for this visit. , BMI There is no height or weight on file to calculate BMI. GEN: Well nourished, well developed, in no acute distress HEENT: normal Neck: no JVD, carotid bruits, or masses Cardiac: ***RRR; no murmurs, rubs, or gallops,no edema    Respiratory:  Clear to auscultation bilaterally, normal work of breathing GI: soft, nontender, nondistended, + BS MS: no deformity or atrophy Skin: warm and dry, no rash Neuro:  Strength and sensation are intact Psych: euthymic mood, full affect   EKG:  EKG {ACTION; IS/IS [id number]} ordered today. The ekg ordered today demonstrates ***   Recent Labs: No results found for requested labs within last 8760 hours.    Lipid Panel    Component Value Date/Time   CHOL 195 04/18/2014 1532   TRIG 215 (H) 04/18/2014 1532   HDL 46 04/18/2014 1532   CHOLHDL 4.2 04/18/2014 1532   VLDL 43 (H) 04/18/2014 1532   LDLCALC 106 (H) 04/18/2014 1532      Wt Readings from Last 3 Encounters:  12/04/15 171 lb 3.2 oz (77.7 kg)  11/08/15 170 lb 12.8 oz (77.5 kg)  02/11/15 186 lb (84.4 kg)      Other studies Reviewed: Additional studies/ records that were reviewed today include: ***. Review of the above records demonstrates: ***   ASSESSMENT AND PLAN:  1.  ***   Current medicines are reviewed at length with the patient today.    Labs/ tests ordered today include: *** Phill Myron. West Pugh, ANP, AACC   10/16/2017 2:24 PM    [address] Office [phone] Fax [phone]

## 2017-10-18 ENCOUNTER — Ambulatory Visit: Payer: Medicaid Other | Admitting: Adult Health

## 2017-11-03 ENCOUNTER — Encounter: Payer: Self-pay | Admitting: Family Medicine

## 2017-12-08 ENCOUNTER — Other Ambulatory Visit: Payer: Self-pay | Admitting: Internal Medicine

## 2017-12-16 ENCOUNTER — Other Ambulatory Visit: Payer: Self-pay | Admitting: Internal Medicine

## 2017-12-22 ENCOUNTER — Telehealth (HOSPITAL_COMMUNITY): Payer: Self-pay

## 2017-12-22 NOTE — Telephone Encounter (Signed)
Pt had disability papers sent to the clinic, pt was discharged from the clinic 03/18/14. No records to send to them, note faxed to disability that pt had been discharged from the heart failure clinic

## 2018-01-04 ENCOUNTER — Other Ambulatory Visit: Payer: Self-pay | Admitting: Internal Medicine

## 2018-01-04 NOTE — Telephone Encounter (Signed)
Rx request sent to pharmacy.  

## 2018-01-05 ENCOUNTER — Other Ambulatory Visit: Payer: Self-pay | Admitting: Internal Medicine

## 2018-01-06 ENCOUNTER — Other Ambulatory Visit: Payer: Self-pay | Admitting: Internal Medicine

## 2018-01-31 ENCOUNTER — Other Ambulatory Visit: Payer: Self-pay | Admitting: Internal Medicine

## 2018-02-02 ENCOUNTER — Other Ambulatory Visit: Payer: Self-pay | Admitting: Internal Medicine

## 2018-02-07 ENCOUNTER — Encounter (HOSPITAL_COMMUNITY): Payer: Self-pay | Admitting: Emergency Medicine

## 2018-02-07 ENCOUNTER — Emergency Department (HOSPITAL_COMMUNITY): Payer: Medicaid Other

## 2018-02-07 ENCOUNTER — Emergency Department (HOSPITAL_COMMUNITY)
Admission: EM | Admit: 2018-02-07 | Discharge: 2018-02-07 | Disposition: A | Discharge status: Home / Self Care | Payer: Medicaid Other | Attending: Emergency Medicine | Admitting: Emergency Medicine

## 2018-02-07 ENCOUNTER — Other Ambulatory Visit: Payer: Self-pay

## 2018-02-07 DIAGNOSIS — F17210 Nicotine dependence, cigarettes, uncomplicated: Secondary | ICD-10-CM | POA: Diagnosis not present

## 2018-02-07 DIAGNOSIS — J449 Chronic obstructive pulmonary disease, unspecified: Secondary | ICD-10-CM | POA: Insufficient documentation

## 2018-02-07 DIAGNOSIS — Z79899 Other long term (current) drug therapy: Secondary | ICD-10-CM | POA: Insufficient documentation

## 2018-02-07 DIAGNOSIS — I110 Hypertensive heart disease with heart failure: Secondary | ICD-10-CM | POA: Diagnosis not present

## 2018-02-07 DIAGNOSIS — I5022 Chronic systolic (congestive) heart failure: Secondary | ICD-10-CM | POA: Insufficient documentation

## 2018-02-07 DIAGNOSIS — R0789 Other chest pain: Secondary | ICD-10-CM | POA: Insufficient documentation

## 2018-02-07 LAB — BASIC METABOLIC PANEL
Anion gap: 9 (ref 5–15)
BUN: 15 mg/dL (ref 6–20)
CO2: 27 mmol/L (ref 22–32)
Calcium: 9.1 mg/dL (ref 8.9–10.3)
Chloride: 103 mmol/L (ref 98–111)
Creatinine, Ser: 1.24 mg/dL (ref 0.61–1.24)
GFR calc Af Amer: >60 mL/min (ref >60)
GFR calc non Af Amer: >60 mL/min (ref >60)
Glucose, Bld: 112 mg/dL — ABNORMAL HIGH (ref 70–99)
Potassium: 5 mmol/L (ref 3.5–5.1)
Sodium: 139 mmol/L (ref 135–145)

## 2018-02-07 LAB — CBC WITH DIFFERENTIAL/PLATELET
Abs Immature Granulocytes: 0.02 10*3/uL (ref 0.00–0.07)
Basophils Absolute: 0.1 10*3/uL (ref 0.0–0.1)
Basophils Relative: 1 %
Eosinophils Absolute: 0.2 10*3/uL (ref 0.0–0.5)
Eosinophils Relative: 3 %
HCT: 44.7 % (ref 39.0–52.0)
Hemoglobin: 14.8 g/dL (ref 13.0–17.0)
Immature Granulocytes: 0 %
Lymphocytes Relative: 22 %
Lymphs Abs: 1.4 10*3/uL (ref 0.7–4.0)
MCH: 31.5 pg (ref 26.0–34.0)
MCHC: 33.1 g/dL (ref 30.0–36.0)
MCV: 95.1 fL (ref 80.0–100.0)
Monocytes Absolute: 0.6 10*3/uL (ref 0.1–1.0)
Monocytes Relative: 10 %
Neutro Abs: 4 10*3/uL (ref 1.7–7.7)
Neutrophils Relative %: 64 %
Platelets: 249 10*3/uL (ref 150–400)
RBC: 4.7 MIL/uL (ref 4.22–5.81)
RDW: 11.9 % (ref 11.5–15.5)
WBC: 6.4 10*3/uL (ref 4.0–10.5)
nRBC: 0 % (ref 0.0–0.2)

## 2018-02-07 LAB — I-STAT TROPONIN, ED: Troponin i, poc: 0.02 ng/mL (ref 0.00–0.08)

## 2018-02-07 LAB — RAPID URINE DRUG SCREEN, HOSP PERFORMED
Amphetamines: NOT DETECTED
Barbiturates: NOT DETECTED
Benzodiazepines: NOT DETECTED
Cocaine: POSITIVE — AB
Opiates: POSITIVE — AB
Tetrahydrocannabinol: POSITIVE — AB

## 2018-02-07 MED ORDER — HYDROMORPHONE HCL 1 MG/ML IJ SOLN
0.5000 mg | Freq: Once | INTRAMUSCULAR | Status: AC
  Administered 2018-02-07: 0.5 mg via INTRAVENOUS
  Filled 2018-02-07: qty 1

## 2018-02-07 NOTE — ED Notes (Signed)
Pt continues to pace up and down halls and saying he is ready to leave. IV d/ced and pt left

## 2018-02-07 NOTE — ED Provider Notes (Signed)
[facility] EMERGENCY DEPARTMENT Provider Note   CSN: [id number] Arrival date & time: 02/07/18  1224     History   Chief Complaint Chief Complaint  Patient presents with  . Back Pain    HPI Charles Cervantes is a 58 y.o. male.  HPI   58 year old male presents today with complaints of right-sided back and chest pain.  Patient notes that he woke up in the early morning hours with pain in the right posterior back radiating around to the anterior chest.  He notes pain with palpation of the posterior back.  He notes very minimal associated shortness of breath.  He denies any fever, productive cough, abdominal pain nausea or vomiting.  Patient denies any trauma.  No medications prior to arrival.  Past Medical History:  Diagnosis Date  . CHF (congestive heart failure) (HCC)    takes Furosemide daily  . Chronic back pain   . Constipation    takes Colace daily  . COPD (chronic obstructive pulmonary disease) (HCC)    Spiriva daily  . Depression    takes Effexor daily and Risperdal nightly  . Dizziness   . H/O blood clots 2012   in leg  . Headache    occasionally  . Hyperlipidemia   . Hypertension    takes Losartan and Coreg daily  . Muscle spasm    takes Zizanidine daily as needed  . Shortness of breath dyspnea    more with exertion but sometimes notices with lying/sitting  . Sleep apnea   . Weakness    tingling and numbness    Patient Active Problem List   Diagnosis Date Noted  . Frequent No-show for appointment 02/08/2016  . NICM (nonischemic cardiomyopathy) ([place]) 12/04/2015  . Back pain 09/03/2014  . Spinal stenosis in cervical region 04/21/2014  . Cervical disc disorder with radiculopathy of cervical region 04/21/2014  . COPD (chronic obstructive pulmonary disease) ([place]) 04/18/2014  . Right knee buckling 04/18/2014  . Chronic low back pain 03/17/2014  . Myofascial pain syndrome 01/20/2014  . Anxiety and depression 11/04/2013  . PTSD  (post-traumatic stress disorder) 11/04/2013  . Neuropathic pain 09/12/2013  . Healthcare maintenance 09/12/2013  . OSA (obstructive sleep apnea) 03/26/2013  . HTN (hypertension) 01/01/2013  . Chest pain 11/16/2012  . Current smoker 09/05/2012  . Chronic systolic heart failure ([place]) 05/08/2012  . Polysubstance abuse ([place]) 05/08/2012  . Alcohol abuse 05/01/2012  . Cocaine abuse ([place]) 05/01/2012    Past Surgical History:  Procedure Laterality Date  . LEFT AND RIGHT HEART CATHETERIZATION WITH CORONARY ANGIOGRAM N/A 11/20/2012   Procedure: LEFT AND RIGHT HEART CATHETERIZATION WITH CORONARY ANGIOGRAM;  Surgeon: Jolaine Artist, MD;  Location: [facility] CATH LAB;  Service: Cardiovascular;  Laterality: N/A;  . Left Second finger surgery    . LUMBAR FUSION  09/05/2014   L5 S1        Home Medications    Prior to Admission medications   Medication Sig Start Date End Date Taking? Authorizing Provider  albuterol (PROVENTIL HFA;VENTOLIN HFA) 108 (90 BASE) MCG/ACT inhaler Inhale 2 puffs into the lungs every 6 (six) hours as needed for wheezing or shortness of breath. 04/18/14   Vivi Barrack, MD  carvedilol (COREG) 6.25 MG tablet Take 1 tablet (6.25 mg total) by mouth 2 (two) times daily with a meal. NEED OV. 02/02/18   Hilty, Nadean Corwin, MD  furosemide (LASIX) 40 MG tablet Take 1 tablet (40 mg total) by mouth daily. Please make overdue appt  with Dr. Debara Pickett before anymore refills. 2nd attempt 02/02/18   Pixie Casino, MD  gabapentin (NEURONTIN) 300 MG capsule Take 1 capsule (300 mg total) by mouth 4 (four) times daily. 03/18/15   Kirsteins, Luanna Salk, MD  HYDROcodone-acetaminophen (NORCO) 10-325 MG tablet Take 1 tablet by mouth 3 (three) times daily. 10/07/14   Vivi Barrack, MD  losartan (COZAAR) 100 MG tablet Take 1 tablet (100 mg total) by mouth daily. 01/31/18   Hilty, Nadean Corwin, MD  methocarbamol (ROBAXIN) 500 MG tablet Take 1 tablet (500 mg total) by mouth 3 (three) times daily as needed for  muscle spasms. 09/03/14   Melina Schools, MD  oxyCODONE-acetaminophen (PERCOCET) 10-325 MG per tablet Take 1 tablet by mouth every 4 (four) hours as needed for pain. 09/03/14   Melina Schools, MD  risperiDONE (RISPERDAL) 3 MG tablet Take 1 tablet by mouth at bedtime. 04/21/14   [provider]  traMADol (ULTRAM) 50 MG tablet Take 1 tablet (50 mg total) by mouth every 6 (six) hours as needed. 11/17/14   Kirsteins, Luanna Salk, MD    Family History History reviewed. No pertinent family history.  Social History Social History   Tobacco Use  . Smoking status: Current Some Day Smoker    Packs/day: 0.25    Years: 20.00    Pack years: 5.00    Types: Cigarettes  . Smokeless tobacco: Never Used  . Tobacco comment: 1-2 cigs per day (08/15/14)  Substance Use Topics  . Alcohol use: No    Alcohol/week: 0.0 standard drinks    Comment: stopped in april 2014  . Drug use: Yes     Allergies   Ace inhibitors   Review of Systems Review of Systems  All other systems reviewed and are negative.   Physical Exam Updated Vital Signs BP (!) 186/115 (BP Location: Right Arm)   Pulse (!) 58   Temp 98 F (36.7 C) (Oral)   Ht 6\' 1"  (1.854 m)   Wt 77.1 kg   SpO2 99%   BMI 22.43 kg/m   Physical Exam Vitals signs and nursing note reviewed.  Constitutional:      Appearance: He is well-developed.  HENT:     Head: Normocephalic and atraumatic.  Eyes:     General: No scleral icterus.       Right eye: No discharge.        Left eye: No discharge.     Conjunctiva/sclera: Conjunctivae normal.     Pupils: Pupils are equal, round, and reactive to light.  Neck:     Musculoskeletal: Normal range of motion.     Vascular: No JVD.     Trachea: No tracheal deviation.  Cardiovascular:     Rate and Rhythm: Regular rhythm. Tachycardia present.  Pulmonary:     Effort: Pulmonary effort is normal. No respiratory distress.     Breath sounds: Normal breath sounds. No stridor. No wheezing, rhonchi or  rales.  Chest:     Chest wall: No tenderness.  Skin:    Comments: No rash noted to the back or chest  Neurological:     Mental Status: He is alert and oriented to person, place, and time.     Coordination: Coordination normal.  Psychiatric:        Behavior: Behavior normal.        Thought Content: Thought content normal.        Judgment: Judgment normal.    ED Treatments / Results  Labs (all labs ordered are  listed, but only abnormal results are displayed) Labs Reviewed  BASIC METABOLIC PANEL - Abnormal; Notable for the following components:      Result Value   Glucose, Bld 112 (*)    All other components within normal limits  CBC WITH DIFFERENTIAL/PLATELET  RAPID URINE DRUG SCREEN, HOSP PERFORMED  I-STAT TROPONIN, ED    EKG EKG Interpretation  Date/Time:  Wednesday February 07 2018 12:56:05 EST Ventricular Rate:  63 PR Interval:  130 QRS Duration: 96 QT Interval:  388 QTC Calculation: 397 R Axis:   57 Text Interpretation:  Normal sinus rhythm Left ventricular hypertrophy with repolarization abnormality Cannot rule out Septal infarct , age undetermined Abnormal ECG No significant change since last tracing Confirmed by Dorie Rank [phone]) on 02/07/2018 1:25:37 PM   Radiology Dg Ribs Unilateral W/chest Right  Result Date: 02/07/2018 CLINICAL DATA:  Anterior and posterior right-sided chest pain. EXAM: RIGHT RIBS AND CHEST - 3+ VIEW COMPARISON:  Chest radiographs 11/08/2015 FINDINGS: The cardiac silhouette is mildly enlarged. Aortic atherosclerosis is noted. Slight prominence of the interstitial markings is unchanged. No overt edema, airspace consolidation, pleural effusion, or pneumothorax is identified. Bilateral nipple shadows are again noted. Asymmetric narrowing of the interspace between the posterior right fourth and fifth ribs is chronic. No rib fracture is identified. IMPRESSION: No rib fracture identified. Electronically Signed   By: Logan Bores M.D.   On: 02/07/2018  13:25    Procedures Procedures (including critical care time)  Medications Ordered in ED Medications  HYDROmorphone (DILAUDID) injection 0.5 mg (0.5 mg Intravenous Given 02/07/18 1255)  HYDROmorphone (DILAUDID) injection 0.5 mg (0.5 mg Intravenous Given 02/07/18 1415)     Initial Impression / Assessment and Plan / ED Course  I have reviewed the triage vital signs and the nursing notes.  Pertinent labs & imaging results that were available during my care of the patient were reviewed by me and considered in my medical decision making (see chart for details).     Labs: CBC, BMP, i-STAT troponin  Imaging: DG ribs unilateral chest right  Consults:  Therapeutics: Dilaudid  Discharge Meds:   Assessment/Plan:    58 year old male presents today with complaints of right-sided rib pain.  Uncertain etiology at this time although shingles, musculoskeletal pain, PE all within the differential.  Patient was given pain medication here.  He is anxious pacing the hallway.  He is adamant that he needs to eat food, he was given a sandwich and soda here.  Patient continued to be anxious.  He has no signs of respiratory distress, suspicion for PE much lower.  He has tenderness along the back which would lean more towards a musculoskeletal etiology.  His labs are thus far reassuring with no signs of ACS, no tachycardia and no infectious etiology.  Patient is upset, he is requesting fruit and that he needs more than just a sandwich to eat.  Patient is requesting nursing staff to remove his IV so he may leave.  I discussed with him that his work-up was not complete and I need to continue my evaluation to rule out any life-threatening etiology.  Appeared to agree to staying but wanted his IV out.  Once his IV was out patient was seen pacing the hallway and then left.   Although work-up was not complete here patient had no significant vital sign abnormalities other than hypertension, he had reassuring work-up  thus far.     Final Clinical Impressions(s) / ED Diagnoses   Final diagnoses:  Chest wall  pain    ED Discharge Orders    None       Francee Gentile 02/07/18 1549    Dorie Rank, MD 02/07/18 [phone]

## 2018-02-07 NOTE — ED Notes (Signed)
Pt continues to leave room and pacing halls

## 2018-02-07 NOTE — ED Notes (Signed)
Patient left against medical advice. EDP aware.

## 2018-02-07 NOTE — ED Triage Notes (Signed)
Patient reports waking up with back spasms. Dnies recent injury. Right side back spasms reported. Patient unable to sit sill. Denies nausea or vomiting.

## 2018-02-07 NOTE — ED Notes (Signed)
ED Provider at bedside. Merry Proud

## 2018-02-07 NOTE — ED Notes (Signed)
Pt adamant about leaving. Pt ready to walk out with IV in right AC. Pt asked to wait a few minutes. Informed Merry Proud - PA that pt wanted to leave. Merry Proud - PA asked [employer] to remove IV. Pt's IV removed and pt left.

## 2018-03-19 ENCOUNTER — Emergency Department (HOSPITAL_COMMUNITY)
Admission: EM | Admit: 2018-03-19 | Discharge: 2018-03-19 | Disposition: A | Discharge status: Home / Self Care | Payer: Medicaid Other | Attending: Emergency Medicine | Admitting: Emergency Medicine

## 2018-03-19 ENCOUNTER — Emergency Department (HOSPITAL_COMMUNITY): Payer: Medicaid Other

## 2018-03-19 ENCOUNTER — Encounter (HOSPITAL_COMMUNITY): Payer: Self-pay | Admitting: Emergency Medicine

## 2018-03-19 ENCOUNTER — Other Ambulatory Visit: Payer: Self-pay

## 2018-03-19 DIAGNOSIS — F17210 Nicotine dependence, cigarettes, uncomplicated: Secondary | ICD-10-CM | POA: Diagnosis not present

## 2018-03-19 DIAGNOSIS — I110 Hypertensive heart disease with heart failure: Secondary | ICD-10-CM | POA: Diagnosis not present

## 2018-03-19 DIAGNOSIS — Z79899 Other long term (current) drug therapy: Secondary | ICD-10-CM | POA: Diagnosis not present

## 2018-03-19 DIAGNOSIS — I5022 Chronic systolic (congestive) heart failure: Secondary | ICD-10-CM | POA: Diagnosis not present

## 2018-03-19 DIAGNOSIS — J449 Chronic obstructive pulmonary disease, unspecified: Secondary | ICD-10-CM | POA: Diagnosis not present

## 2018-03-19 DIAGNOSIS — M5412 Radiculopathy, cervical region: Secondary | ICD-10-CM | POA: Diagnosis not present

## 2018-03-19 DIAGNOSIS — E785 Hyperlipidemia, unspecified: Secondary | ICD-10-CM | POA: Diagnosis not present

## 2018-03-19 DIAGNOSIS — R200 Anesthesia of skin: Secondary | ICD-10-CM | POA: Diagnosis present

## 2018-03-19 LAB — BASIC METABOLIC PANEL
Anion gap: 10 (ref 5–15)
BUN: 13 mg/dL (ref 6–20)
CO2: 23 mmol/L (ref 22–32)
Calcium: 9.1 mg/dL (ref 8.9–10.3)
Chloride: 104 mmol/L (ref 98–111)
Creatinine, Ser: 1.1 mg/dL (ref 0.61–1.24)
GFR calc Af Amer: >60 mL/min (ref >60)
GFR calc non Af Amer: >60 mL/min (ref >60)
Glucose, Bld: 126 mg/dL — ABNORMAL HIGH (ref 70–99)
Potassium: 4.4 mmol/L (ref 3.5–5.1)
Sodium: 137 mmol/L (ref 135–145)

## 2018-03-19 LAB — CBC
HEMATOCRIT: 43.6 % (ref 39.0–52.0)
Hemoglobin: 14.1 g/dL (ref 13.0–17.0)
MCH: 30.9 pg (ref 26.0–34.0)
MCHC: 32.3 g/dL (ref 30.0–36.0)
MCV: 95.4 fL (ref 80.0–100.0)
Platelets: 257 10*3/uL (ref 150–400)
RBC: 4.57 MIL/uL (ref 4.22–5.81)
RDW: 12.5 % (ref 11.5–15.5)
WBC: 5.4 10*3/uL (ref 4.0–10.5)
nRBC: 0 % (ref 0.0–0.2)

## 2018-03-19 LAB — I-STAT TROPONIN, ED: Troponin i, poc: 0.02 ng/mL (ref 0.00–0.08)

## 2018-03-19 MED ORDER — DEXAMETHASONE 4 MG PO TABS
10.0000 mg | ORAL_TABLET | Freq: Once | ORAL | Status: AC
  Administered 2018-03-19: 10 mg via ORAL
  Filled 2018-03-19: qty 3

## 2018-03-19 MED ORDER — CYCLOBENZAPRINE HCL 10 MG PO TABS: 10.0000 mg | ORAL_TABLET | Freq: Two times a day (BID) | ORAL | 0 refills | Status: DC | PRN

## 2018-03-19 MED ORDER — SODIUM CHLORIDE 0.9% FLUSH: 3.0000 mL | Freq: Once | INTRAVENOUS | Status: DC

## 2018-03-19 MED ORDER — CYCLOBENZAPRINE HCL 10 MG PO TABS
10.0000 mg | ORAL_TABLET | Freq: Once | ORAL | Status: AC
  Administered 2018-03-19: 10 mg via ORAL
  Filled 2018-03-19: qty 1

## 2018-03-19 NOTE — ED Notes (Signed)
Patient transported to X-ray 

## 2018-03-19 NOTE — ED Triage Notes (Signed)
Pt with right extremity numbness and pain since last Monday (7 days ago). Radiation from right side of his neck down to the arm. Peripheral pulse intact. Pt a/o x4 NAD at triage. Gait steady.

## 2018-03-19 NOTE — Discharge Instructions (Signed)
Take Flexeril for muscle pain and spasms Try a heating pad for tight, sore muscles Please follow up with Dr. Rolena Infante about your neck pain and arm numbness

## 2018-03-19 NOTE — ED Notes (Signed)
Pt verbalized understanding of discharge paperwork, prescriptions and follow-up care 

## 2018-03-19 NOTE — ED Notes (Signed)
Pt denies chest pain but now states "I have some shortness of breath, this doesn't feel like my normal pinched nerve."

## 2018-03-19 NOTE — ED Provider Notes (Signed)
[facility] EMERGENCY DEPARTMENT Provider Note   CSN: [id number] Arrival date & time: 03/19/18  1120    History   Chief Complaint Chief Complaint  Patient presents with  . Numbness  . Arm Pain    HPI RASHED Charles Cervantes is a 58 y.o. male who presents with R arm numbness and pain. 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 The patient states that he has had intermittent numbness and pain of his right arm for the past week. The numbness is thoughout the whole R arm. He reports associated neck pain on the right side. He denies headache, lightheadedness, vision changes, arm or leg weakness, chest pain, SOB, abdominal pain, leg swelling. He has been taking BC powder for the pain. MRI of the cervical spine in 2016 showed: "1. Moderate to severe facet arthropathy on the right from C2-C3 to C5-C6 with severe right neural foraminal stenosis from the C4 to the C6 nerve levels. 2. Bulky disc osteophyte degeneration at C5-C6 and C6-C7 with severe bilateral C7 and C8 foraminal stenosis. 3. Borderline to mild multifactorial spinal stenosis at C3-C4. No spinal cord mass effect or signal abnormality."      HPI  Past Medical History:  Diagnosis Date  . CHF (congestive heart failure) (HCC)    takes Furosemide daily  . Chronic back pain   . Constipation    takes Colace daily  . COPD (chronic obstructive pulmonary disease) (HCC)    Spiriva daily  . Depression    takes Effexor daily and Risperdal nightly  . Dizziness   . H/O blood clots 2012   in leg  . Headache    occasionally  . Hyperlipidemia   . Hypertension    takes Losartan and Coreg daily  . Muscle spasm    takes Zizanidine daily as needed  . Shortness of breath dyspnea    more with exertion but sometimes notices with lying/sitting  . Sleep apnea   . Weakness    tingling and numbness    Patient Active Problem List    Diagnosis Date Noted  . Frequent No-show for appointment 02/08/2016  . NICM (nonischemic cardiomyopathy) ([place]) 12/04/2015  . Back pain 09/03/2014  . Spinal stenosis in cervical region 04/21/2014  . Cervical disc disorder with radiculopathy of cervical region 04/21/2014  . COPD (chronic obstructive pulmonary disease) ([place]) 04/18/2014  . Right knee buckling 04/18/2014  . Chronic low back pain 03/17/2014  . Myofascial pain syndrome 01/20/2014  . Anxiety and depression 11/04/2013  . PTSD (post-traumatic stress disorder) 11/04/2013  . Neuropathic pain 09/12/2013  . Healthcare maintenance 09/12/2013  . OSA (obstructive sleep apnea) 03/26/2013  . HTN (hypertension) 01/01/2013  . Chest pain 11/16/2012  . Current smoker 09/05/2012  . Chronic systolic heart failure ([place]) 05/08/2012  . Polysubstance abuse ([place]) 05/08/2012  . Alcohol abuse 05/01/2012  . Cocaine abuse ([place]) 05/01/2012    Past Surgical History:  Procedure Laterality Date  . LEFT AND RIGHT HEART CATHETERIZATION WITH CORONARY ANGIOGRAM N/A 11/20/2012   Procedure: LEFT AND RIGHT HEART CATHETERIZATION WITH CORONARY ANGIOGRAM;  Surgeon: Jolaine Artist, MD;  Location: [facility] CATH LAB;  Service: Cardiovascular;  Laterality: N/A;  . Left Second finger surgery    . LUMBAR FUSION  09/05/2014   L5 S1        Home Medications    Prior to Admission medications   Medication Sig Start  Date End Date Taking? Authorizing Provider  albuterol (PROVENTIL HFA;VENTOLIN HFA) 108 (90 BASE) MCG/ACT inhaler Inhale 2 puffs into the lungs every 6 (six) hours as needed for wheezing or shortness of breath. 04/18/14   Vivi Barrack, MD  carvedilol (COREG) 6.25 MG tablet Take 1 tablet (6.25 mg total) by mouth 2 (two) times daily with a meal. NEED OV. 02/02/18   Hilty, Nadean Corwin, MD  cyclobenzaprine (FLEXERIL) 10 MG tablet Take 1 tablet (10 mg total) by mouth 2 (two) times daily as needed for muscle spasms. 03/19/18   Recardo Evangelist, PA-C  furosemide  (LASIX) 40 MG tablet Take 1 tablet (40 mg total) by mouth daily. Please make overdue appt with Dr. Debara Pickett before anymore refills. 2nd attempt 02/02/18   Pixie Casino, MD  gabapentin (NEURONTIN) 300 MG capsule Take 1 capsule (300 mg total) by mouth 4 (four) times daily. 03/18/15   Kirsteins, Luanna Salk, MD  HYDROcodone-acetaminophen (NORCO) 10-325 MG tablet Take 1 tablet by mouth 3 (three) times daily. 10/07/14   Vivi Barrack, MD  losartan (COZAAR) 100 MG tablet Take 1 tablet (100 mg total) by mouth daily. 01/31/18   Hilty, Nadean Corwin, MD  methocarbamol (ROBAXIN) 500 MG tablet Take 1 tablet (500 mg total) by mouth 3 (three) times daily as needed for muscle spasms. 09/03/14   Melina Schools, MD  oxyCODONE-acetaminophen (PERCOCET) 10-325 MG per tablet Take 1 tablet by mouth every 4 (four) hours as needed for pain. 09/03/14   Melina Schools, MD  risperiDONE (RISPERDAL) 3 MG tablet Take 1 tablet by mouth at bedtime. 04/21/14   [provider]  traMADol (ULTRAM) 50 MG tablet Take 1 tablet (50 mg total) by mouth every 6 (six) hours as needed. 11/17/14   Kirsteins, Luanna Salk, MD    Family History No family history on file.  Social History Social History   Tobacco Use  . Smoking status: Current Some Day Smoker    Packs/day: 0.25    Years: 20.00    Pack years: 5.00    Types: Cigarettes  . Smokeless tobacco: Never Used  . Tobacco comment: 1-2 cigs per day (08/15/14)  Substance Use Topics  . Alcohol use: No    Alcohol/week: 0.0 standard drinks    Comment: stopped in april 2014  . Drug use: Yes     Allergies   Ace inhibitors   Review of Systems Review of Systems  Constitutional: Negative for fever.  Respiratory: Negative for shortness of breath.   Cardiovascular: Negative for chest pain.  Gastrointestinal: Negative for abdominal pain.  Musculoskeletal: Positive for myalgias and neck pain.  Neurological: Positive for numbness. Negative for dizziness, syncope, weakness and headaches.      Physical Exam Updated Vital Signs BP (!) 152/96   Pulse (!) 46   Temp (!) 97.5 F (36.4 C) (Oral)   Resp (!) 9   Ht 6' 0.5" (1.842 m)   Wt 72.6 kg   SpO2 98%   BMI 21.40 kg/m   Physical Exam Vitals signs and nursing note reviewed.  Constitutional:      General: He is not in acute distress.    Appearance: Normal appearance. He is well-developed. He is not ill-appearing.     Comments: Calm, cooperative. Occasionally falls asleep during exam, possibly intoxicated  HENT:     Head: Normocephalic and atraumatic.  Eyes:     General: No scleral icterus.       Right eye: No discharge.  Left eye: No discharge.     Extraocular Movements: Extraocular movements intact.     Conjunctiva/sclera: Conjunctivae normal.     Pupils: Pupils are equal, round, and reactive to light.     Comments: Pinpoint pupils bilaterally  Neck:     Musculoskeletal: Normal range of motion.     Comments: Right sided cervical paraspinal muscle tenderness. Tight trapezius muscle. Palpation of trapezius makes numbness worse per patient Cardiovascular:     Rate and Rhythm: Bradycardia present. Rhythm irregular.     Comments: Multiple PVCs Pulmonary:     Effort: Pulmonary effort is normal. No respiratory distress.     Breath sounds: Normal breath sounds.  Abdominal:     General: There is no distension.     Palpations: Abdomen is soft.     Tenderness: There is no abdominal tenderness.  Skin:    General: Skin is warm and dry.  Neurological:     Mental Status: He is alert and oriented to person, place, and time.     Comments: Lying on stretcher in NAD. GCS 15. Speaks in a clear voice. Cranial nerves II through XII grossly intact. 5/5 strength in all extremities. Sensation fully intact.  Bilateral finger-to-nose intact. Ambulatory    Psychiatric:        Behavior: Behavior normal.      ED Treatments / Results  Labs (all labs ordered are listed, but only abnormal results are displayed) Labs  Reviewed  BASIC METABOLIC PANEL - Abnormal; Notable for the following components:      Result Value   Glucose, Bld 126 (*)    All other components within normal limits  CBC  I-STAT TROPONIN, ED    EKG None  Radiology Dg Chest 2 View  Result Date: 03/19/2018 CLINICAL DATA:  Pain, right arm and chest EXAM: CHEST - 2 VIEW COMPARISON:  02/07/2018, 11/08/2015 FINDINGS: The heart size and mediastinal contours are within normal limits. Both lungs are clear. Mild hyperinflation. Disc degenerative disease of the thoracic spine. IMPRESSION: Mild pulmonary hyperinflation.  No acute abnormality of the lungs. Electronically Signed   By: Eddie Candle M.D.   On: 03/19/2018 12:27    Procedures Procedures (including critical care time)  Medications Ordered in ED Medications  sodium chloride flush (NS) 0.9 % injection 3 mL (3 mLs Intravenous Not Given 03/19/18 1245)  cyclobenzaprine (FLEXERIL) tablet 10 mg (10 mg Oral Given 03/19/18 1312)  dexamethasone (DECADRON) tablet 10 mg (10 mg Oral Given 03/19/18 1312)     Initial Impression / Assessment and Plan / ED Course  I have reviewed the triage vital signs and the nursing notes.  Pertinent labs & imaging results that were available during my care of the patient were reviewed by me and considered in my medical decision making (see chart for details).  58 year old male presents with R arm pain and numbness for one week. He endorses neck pain as well. He is hypertensive and bradycardic at times. CP order set was ordered by nursing staff prior to my evaluation. EKG is sinus bradycardia. CXR is negative. Labs and trop are normal. Does not sound like ACS. Most likely pain is MSK. He is having neck pain and symptoms are exacerbated with palpation of his neck and trapezius. Likely radiculopathy. He has normal strength in his arms and legs - do not think he needs MRI today. He was encouraged to see Dr. Rolena Infante who has seen for his neck in the past. He was given a  dose of  Decadron here and rx for Flexeril.   Final Clinical Impressions(s) / ED Diagnoses   Final diagnoses:  Cervical radiculopathy    ED Discharge Orders         Ordered    cyclobenzaprine (FLEXERIL) 10 MG tablet  2 times daily PRN     03/19/18 1324           Recardo Evangelist, PA-C 03/19/18 1629    Julianne Rice, MD 03/19/18 [phone]

## 2018-03-21 ENCOUNTER — Other Ambulatory Visit: Payer: Self-pay | Admitting: Internal Medicine

## 2018-04-05 ENCOUNTER — Other Ambulatory Visit: Payer: Self-pay | Admitting: Internal Medicine

## 2018-04-15 ENCOUNTER — Other Ambulatory Visit: Payer: Self-pay

## 2018-04-15 ENCOUNTER — Emergency Department (HOSPITAL_COMMUNITY)
Admission: EM | Admit: 2018-04-15 | Discharge: 2018-04-15 | Disposition: A | Discharge status: Home / Self Care | Payer: Medicaid Other | Attending: Emergency Medicine | Admitting: Emergency Medicine

## 2018-04-15 ENCOUNTER — Encounter (HOSPITAL_COMMUNITY): Payer: Self-pay | Admitting: *Deleted

## 2018-04-15 DIAGNOSIS — F17210 Nicotine dependence, cigarettes, uncomplicated: Secondary | ICD-10-CM | POA: Insufficient documentation

## 2018-04-15 DIAGNOSIS — Z79899 Other long term (current) drug therapy: Secondary | ICD-10-CM | POA: Insufficient documentation

## 2018-04-15 DIAGNOSIS — I110 Hypertensive heart disease with heart failure: Secondary | ICD-10-CM | POA: Diagnosis not present

## 2018-04-15 DIAGNOSIS — J449 Chronic obstructive pulmonary disease, unspecified: Secondary | ICD-10-CM | POA: Insufficient documentation

## 2018-04-15 DIAGNOSIS — I509 Heart failure, unspecified: Secondary | ICD-10-CM | POA: Diagnosis not present

## 2018-04-15 DIAGNOSIS — M5412 Radiculopathy, cervical region: Secondary | ICD-10-CM | POA: Diagnosis not present

## 2018-04-15 DIAGNOSIS — M549 Dorsalgia, unspecified: Secondary | ICD-10-CM | POA: Diagnosis present

## 2018-04-15 MED ORDER — TRAMADOL HCL 50 MG PO TABS: 50.0000 mg | ORAL_TABLET | Freq: Four times a day (QID) | ORAL | 0 refills | Status: DC | PRN

## 2018-04-15 MED ORDER — DEXAMETHASONE SODIUM PHOSPHATE 10 MG/ML IJ SOLN
10.0000 mg | Freq: Once | INTRAMUSCULAR | Status: AC
  Administered 2018-04-15: 10 mg via INTRAMUSCULAR
  Filled 2018-04-15: qty 1

## 2018-04-15 MED ORDER — PREDNISONE 50 MG PO TABS: 50.0000 mg | ORAL_TABLET | Freq: Every day | ORAL | 0 refills | Status: DC

## 2018-04-15 MED ORDER — KETOROLAC TROMETHAMINE 30 MG/ML IJ SOLN
60.0000 mg | Freq: Once | INTRAMUSCULAR | Status: AC
  Administered 2018-04-15: 60 mg via INTRAMUSCULAR
  Filled 2018-04-15: qty 2

## 2018-04-15 MED ORDER — CYCLOBENZAPRINE HCL 10 MG PO TABS: 10.0000 mg | ORAL_TABLET | Freq: Three times a day (TID) | ORAL | 0 refills | Status: DC | PRN

## 2018-04-15 MED ORDER — OXYCODONE-ACETAMINOPHEN 5-325 MG PO TABS
1.0000 | ORAL_TABLET | Freq: Once | ORAL | Status: AC
  Administered 2018-04-15: 1 via ORAL
  Filled 2018-04-15: qty 1

## 2018-04-15 NOTE — Discharge Instructions (Signed)
Return here as needed. Follow up with Dr. Rolena Infante

## 2018-04-15 NOTE — ED Triage Notes (Signed)
PT reports rt upper back pain with Rt arm pain /numbness. Pt reports he is waiting for an appt with DR Rolena Infante.

## 2018-04-17 NOTE — ED Provider Notes (Signed)
[facility] EMERGENCY DEPARTMENT Provider Note   CSN: [id number] Arrival date & time: 04/15/18  0849    History   Chief Complaint Chief Complaint  Patient presents with  . Back Pain    HPI Charles Cervantes is a 58 y.o. male.     HPI Patient presents to the emergency department with upper back and neck pain with tingling in his right arm.  The patient states is been ongoing for quite a while.  States that he is run out of his medications.  The patient states he is due to see Dr. Rolena Infante for this issue.  Patient states that nothing seems make the condition better.  He states that certain movements palpation make the pain worse.  Patient denies headache, blurred vision, chest pain, shortness of breath, weakness, dizziness, nausea, vomiting, near-syncope or syncope. Past Medical History:  Diagnosis Date  . CHF (congestive heart failure) (HCC)    takes Furosemide daily  . Chronic back pain   . Constipation    takes Colace daily  . COPD (chronic obstructive pulmonary disease) (HCC)    Spiriva daily  . Depression    takes Effexor daily and Risperdal nightly  . Dizziness   . H/O blood clots 2012   in leg  . Headache    occasionally  . Hyperlipidemia   . Hypertension    takes Losartan and Coreg daily  . Muscle spasm    takes Zizanidine daily as needed  . Shortness of breath dyspnea    more with exertion but sometimes notices with lying/sitting  . Sleep apnea   . Weakness    tingling and numbness    Patient Active Problem List   Diagnosis Date Noted  . Frequent No-show for appointment 02/08/2016  . NICM (nonischemic cardiomyopathy) ([place]) 12/04/2015  . Back pain 09/03/2014  . Spinal stenosis in cervical region 04/21/2014  . Cervical disc disorder with radiculopathy of cervical region 04/21/2014  . COPD (chronic obstructive pulmonary disease) ([place]) 04/18/2014  . Right knee buckling 04/18/2014  . Chronic low back pain 03/17/2014  . Myofascial pain  syndrome 01/20/2014  . Anxiety and depression 11/04/2013  . PTSD (post-traumatic stress disorder) 11/04/2013  . Neuropathic pain 09/12/2013  . Healthcare maintenance 09/12/2013  . OSA (obstructive sleep apnea) 03/26/2013  . HTN (hypertension) 01/01/2013  . Chest pain 11/16/2012  . Current smoker 09/05/2012  . Chronic systolic heart failure ([place]) 05/08/2012  . Polysubstance abuse ([place]) 05/08/2012  . Alcohol abuse 05/01/2012  . Cocaine abuse ([place]) 05/01/2012    Past Surgical History:  Procedure Laterality Date  . LEFT AND RIGHT HEART CATHETERIZATION WITH CORONARY ANGIOGRAM N/A 11/20/2012   Procedure: LEFT AND RIGHT HEART CATHETERIZATION WITH CORONARY ANGIOGRAM;  Surgeon: Jolaine Artist, MD;  Location: [facility] CATH LAB;  Service: Cardiovascular;  Laterality: N/A;  . Left Second finger surgery    . LUMBAR FUSION  09/05/2014   L5 S1        Home Medications    Prior to Admission medications   Medication Sig Start Date End Date Taking? Authorizing Provider  albuterol (PROVENTIL HFA;VENTOLIN HFA) 108 (90 BASE) MCG/ACT inhaler Inhale 2 puffs into the lungs every 6 (six) hours as needed for wheezing or shortness of breath. 04/18/14   Vivi Barrack, MD  carvedilol (COREG) 6.25 MG tablet Take 1 tablet (6.25 mg total) by mouth 2 (two) times daily with a meal. NEED OV. 04/05/18   Hilty, Nadean Corwin, MD  cyclobenzaprine (FLEXERIL) 10 MG tablet Take  1 tablet (10 mg total) by mouth 3 (three) times daily as needed for muscle spasms. 04/15/18   Blanche Gallien, Harrell Gave, PA-C  furosemide (LASIX) 40 MG tablet Take 1 tablet (40 mg total) by mouth daily. Please make overdue appt with Dr. Debara Pickett before anymore refills. 2nd attempt 04/05/18   Pixie Casino, MD  gabapentin (NEURONTIN) 300 MG capsule Take 1 capsule (300 mg total) by mouth 4 (four) times daily. 03/18/15   Kirsteins, Luanna Salk, MD  HYDROcodone-acetaminophen (NORCO) 10-325 MG tablet Take 1 tablet by mouth 3 (three) times daily. 10/07/14   Vivi Barrack,  MD  losartan (COZAAR) 100 MG tablet Take 1 tablet (100 mg total) by mouth daily. Please make annual appt for future refills. Thank you 03/21/18   Pixie Casino, MD  methocarbamol (ROBAXIN) 500 MG tablet Take 1 tablet (500 mg total) by mouth 3 (three) times daily as needed for muscle spasms. 09/03/14   Melina Schools, MD  oxyCODONE-acetaminophen (PERCOCET) 10-325 MG per tablet Take 1 tablet by mouth every 4 (four) hours as needed for pain. 09/03/14   Melina Schools, MD  predniSONE (DELTASONE) 50 MG tablet Take 1 tablet (50 mg total) by mouth daily. 04/15/18   Oisin Yoakum, Harrell Gave, PA-C  risperiDONE (RISPERDAL) 3 MG tablet Take 1 tablet by mouth at bedtime. 04/21/14   [provider]  traMADol (ULTRAM) 50 MG tablet Take 1 tablet (50 mg total) by mouth every 6 (six) hours as needed for severe pain. 04/15/18   Dalia Heading, PA-C    Family History History reviewed. No pertinent family history.  Social History Social History   Tobacco Use  . Smoking status: Current Some Day Smoker    Packs/day: 0.25    Years: 20.00    Pack years: 5.00    Types: Cigarettes  . Smokeless tobacco: Never Used  . Tobacco comment: 1-2 cigs per day (08/15/14)  Substance Use Topics  . Alcohol use: No    Alcohol/week: 0.0 standard drinks    Comment: stopped in april 2014  . Drug use: Yes     Allergies   Ace inhibitors   Review of Systems Review of Systems All other systems negative except as documented in the HPI. All pertinent positives and negatives as reviewed in the HPI.  Physical Exam Updated Vital Signs BP (!) 179/110 (BP Location: Right Arm)   Pulse (!) 55   Temp 98 F (36.7 C) (Oral)   Resp 18   Ht 6' (1.829 m)   Wt 72.6 kg   SpO2 98%   BMI 21.70 kg/m   Physical Exam Vitals signs and nursing note reviewed.  Constitutional:      General: He is not in acute distress.    Appearance: He is well-developed.  HENT:     Head: Normocephalic and atraumatic.  Eyes:     Pupils:  Pupils are equal, round, and reactive to light.  Pulmonary:     Effort: Pulmonary effort is normal.  Musculoskeletal:       Back:  Skin:    General: Skin is warm and dry.  Neurological:     General: No focal deficit present.     Mental Status: He is alert and oriented to person, place, and time.     Sensory: No sensory deficit.     Motor: No weakness.     Coordination: Coordination normal.     Gait: Gait normal.     Deep Tendon Reflexes: Reflexes normal.      ED Treatments /  Results  Labs (all labs ordered are listed, but only abnormal results are displayed) Labs Reviewed - No data to display  EKG None  Radiology No results found.  Procedures Procedures (including critical care time)  Medications Ordered in ED Medications  oxyCODONE-acetaminophen (PERCOCET/ROXICET) 5-325 MG per tablet 1 tablet (1 tablet Oral Given 04/15/18 0923)  ketorolac (TORADOL) 30 MG/ML injection 60 mg (60 mg Intramuscular Given 04/15/18 0922)  dexamethasone (DECADRON) injection 10 mg (10 mg Intramuscular Given 04/15/18 1093)     Initial Impression / Assessment and Plan / ED Course  I have reviewed the triage vital signs and the nursing notes.  Pertinent labs & imaging results that were available during my care of the patient were reviewed by me and considered in my medical decision making (see chart for details).        Patient has cervical radiculopathy without significant neurological deficits noted on exam.  Is got normal strength and range of motion in his upper extremities.  Patient is advised he will need to follow-up as scheduled with Dr. Rolena Infante.  Final Clinical Impressions(s) / ED Diagnoses   Final diagnoses:  Cervical radiculopathy    ED Discharge Orders         Ordered    predniSONE (DELTASONE) 50 MG tablet  Daily     04/15/18 0950    cyclobenzaprine (FLEXERIL) 10 MG tablet  3 times daily PRN     04/15/18 0950    traMADol (ULTRAM) 50 MG tablet  Every 6 hours PRN      04/15/18 0950           Dalia Heading, PA-C 04/17/18 1525    Gareth Morgan, MD 04/24/18 [phone]

## 2018-04-20 ENCOUNTER — Other Ambulatory Visit: Payer: Self-pay | Admitting: Internal Medicine

## 2018-04-20 ENCOUNTER — Encounter: Payer: Self-pay | Admitting: *Deleted

## 2018-04-20 NOTE — Telephone Encounter (Signed)
Carvedilol 6,25 mg and furosemide 40 mg refilled.

## 2018-04-24 ENCOUNTER — Emergency Department (HOSPITAL_COMMUNITY)
Admission: EM | Admit: 2018-04-24 | Discharge: 2018-04-24 | Disposition: A | Discharge status: Home / Self Care | Payer: Medicaid Other | Attending: Emergency Medicine | Admitting: Emergency Medicine

## 2018-04-24 ENCOUNTER — Encounter (HOSPITAL_COMMUNITY): Payer: Self-pay

## 2018-04-24 ENCOUNTER — Other Ambulatory Visit: Payer: Self-pay

## 2018-04-24 DIAGNOSIS — F17210 Nicotine dependence, cigarettes, uncomplicated: Secondary | ICD-10-CM | POA: Insufficient documentation

## 2018-04-24 DIAGNOSIS — J449 Chronic obstructive pulmonary disease, unspecified: Secondary | ICD-10-CM | POA: Diagnosis not present

## 2018-04-24 DIAGNOSIS — M546 Pain in thoracic spine: Secondary | ICD-10-CM | POA: Diagnosis present

## 2018-04-24 DIAGNOSIS — I110 Hypertensive heart disease with heart failure: Secondary | ICD-10-CM | POA: Insufficient documentation

## 2018-04-24 DIAGNOSIS — M5412 Radiculopathy, cervical region: Secondary | ICD-10-CM | POA: Diagnosis not present

## 2018-04-24 DIAGNOSIS — Z79899 Other long term (current) drug therapy: Secondary | ICD-10-CM | POA: Insufficient documentation

## 2018-04-24 DIAGNOSIS — I5021 Acute systolic (congestive) heart failure: Secondary | ICD-10-CM | POA: Diagnosis not present

## 2018-04-24 MED ORDER — KETOROLAC TROMETHAMINE 15 MG/ML IJ SOLN
15.0000 mg | Freq: Once | INTRAMUSCULAR | Status: AC
  Administered 2018-04-24: 15 mg via INTRAMUSCULAR
  Filled 2018-04-24: qty 1

## 2018-04-24 MED ORDER — CYCLOBENZAPRINE HCL 10 MG PO TABS
10.0000 mg | ORAL_TABLET | Freq: Once | ORAL | Status: AC
  Administered 2018-04-24: 10 mg via ORAL
  Filled 2018-04-24: qty 1

## 2018-04-24 MED ORDER — CYCLOBENZAPRINE HCL 10 MG PO TABS: 10.0000 mg | ORAL_TABLET | Freq: Two times a day (BID) | ORAL | 0 refills | Status: DC | PRN

## 2018-04-24 NOTE — ED Notes (Signed)
Bed: [facility]2 Expected date:  Expected time:  Means of arrival:  Comments: 

## 2018-04-24 NOTE — ED Provider Notes (Signed)
[facility] DEPT Provider Note   CSN: [id number] Arrival date & time: 04/24/18  1408    History   Chief Complaint Chief Complaint  Patient presents with  . Back Pain  . In police custody  . Medication Refill    HPI CAL GINDLESPERGER is a 58 y.o. male.     The history is provided by the patient and medical records. No language interpreter was used.  Back Pain  Associated symptoms: no weakness   Medication Refill   WREN GALLAGA is a 58 y.o. male  with a PMH as listed below including chronic back pain who presents to the Emergency Department in police custody complaining of left upper back pain with radiation down his right arm.  He states that he was on his way to the hospital when he got arrested.  He is followed by Dr. Rolena Infante of orthopedics.  He states that he was seen in the hospital a week or 2 ago where he was given prescriptions for muscle relaxer and for tramadol.  He was feeling a little better with these medications, but he has now run out and his symptoms have now worsened.  He reports trying to get in touch with Dr. Charmayne Sheer office, but due to the ongoing pandemic, could not get in touch with him.  He reports that the nurse told him that there would be a prescription at the pharmacy, but he went there and there was no prescription. No weakness. Not dropping anything.   Past Medical History:  Diagnosis Date  . CHF (congestive heart failure) (HCC)    takes Furosemide daily  . Chronic back pain   . Constipation    takes Colace daily  . COPD (chronic obstructive pulmonary disease) (HCC)    Spiriva daily  . Depression    takes Effexor daily and Risperdal nightly  . Dizziness   . H/O blood clots 2012   in leg  . Headache    occasionally  . Hyperlipidemia   . Hypertension    takes Losartan and Coreg daily  . Muscle spasm    takes Zizanidine daily as needed  . Shortness of breath dyspnea    more with exertion but sometimes notices with  lying/sitting  . Sleep apnea   . Weakness    tingling and numbness    Patient Active Problem List   Diagnosis Date Noted  . Frequent No-show for appointment 02/08/2016  . NICM (nonischemic cardiomyopathy) ([place]) 12/04/2015  . Back pain 09/03/2014  . Spinal stenosis in cervical region 04/21/2014  . Cervical disc disorder with radiculopathy of cervical region 04/21/2014  . COPD (chronic obstructive pulmonary disease) ([place]) 04/18/2014  . Right knee buckling 04/18/2014  . Chronic low back pain 03/17/2014  . Myofascial pain syndrome 01/20/2014  . Anxiety and depression 11/04/2013  . PTSD (post-traumatic stress disorder) 11/04/2013  . Neuropathic pain 09/12/2013  . Healthcare maintenance 09/12/2013  . OSA (obstructive sleep apnea) 03/26/2013  . HTN (hypertension) 01/01/2013  . Chest pain 11/16/2012  . Current smoker 09/05/2012  . Chronic systolic heart failure ([place]) 05/08/2012  . Polysubstance abuse ([place]) 05/08/2012  . Alcohol abuse 05/01/2012  . Cocaine abuse ([place]) 05/01/2012    Past Surgical History:  Procedure Laterality Date  . LEFT AND RIGHT HEART CATHETERIZATION WITH CORONARY ANGIOGRAM N/A 11/20/2012   Procedure: LEFT AND RIGHT HEART CATHETERIZATION WITH CORONARY ANGIOGRAM;  Surgeon: Jolaine Artist, MD;  Location: [facility] CATH LAB;  Service: Cardiovascular;  Laterality: N/A;  .  Left Second finger surgery    . LUMBAR FUSION  09/05/2014   L5 S1        Home Medications    Prior to Admission medications   Medication Sig Start Date End Date Taking? Authorizing Provider  albuterol (PROVENTIL HFA;VENTOLIN HFA) 108 (90 BASE) MCG/ACT inhaler Inhale 2 puffs into the lungs every 6 (six) hours as needed for wheezing or shortness of breath. 04/18/14   Vivi Barrack, MD  carvedilol (COREG) 6.25 MG tablet Take 1 tablet (6.25 mg total) by mouth 2 (two) times daily with a meal. NEED OV. 04/20/18   Hilty, Nadean Corwin, MD  cyclobenzaprine (FLEXERIL) 10 MG tablet Take 1 tablet (10 mg total)  by mouth 3 (three) times daily as needed for muscle spasms. 04/15/18   Lawyer, Harrell Gave, PA-C  furosemide (LASIX) 40 MG tablet Take 1 tablet (40 mg total) by mouth daily. Please make overdue appt with Dr. Debara Pickett before anymore refills. 2nd attempt 04/20/18   Pixie Casino, MD  gabapentin (NEURONTIN) 300 MG capsule Take 1 capsule (300 mg total) by mouth 4 (four) times daily. 03/18/15   Kirsteins, Luanna Salk, MD  HYDROcodone-acetaminophen (NORCO) 10-325 MG tablet Take 1 tablet by mouth 3 (three) times daily. 10/07/14   Vivi Barrack, MD  losartan (COZAAR) 100 MG tablet Take 1 tablet (100 mg total) by mouth daily. Please make annual appt for future refills. Thank you 03/21/18   Pixie Casino, MD  methocarbamol (ROBAXIN) 500 MG tablet Take 1 tablet (500 mg total) by mouth 3 (three) times daily as needed for muscle spasms. 09/03/14   Melina Schools, MD  oxyCODONE-acetaminophen (PERCOCET) 10-325 MG per tablet Take 1 tablet by mouth every 4 (four) hours as needed for pain. 09/03/14   Melina Schools, MD  predniSONE (DELTASONE) 50 MG tablet Take 1 tablet (50 mg total) by mouth daily. 04/15/18   Lawyer, Harrell Gave, PA-C  risperiDONE (RISPERDAL) 3 MG tablet Take 1 tablet by mouth at bedtime. 04/21/14   [provider]  traMADol (ULTRAM) 50 MG tablet Take 1 tablet (50 mg total) by mouth every 6 (six) hours as needed for severe pain. 04/15/18   Dalia Heading, PA-C    Family History History reviewed. No pertinent family history.  Social History Social History   Tobacco Use  . Smoking status: Current Some Day Smoker    Packs/day: 0.25    Years: 20.00    Pack years: 5.00    Types: Cigarettes  . Smokeless tobacco: Never Used  . Tobacco comment: 1-2 cigs per day (08/15/14)  Substance Use Topics  . Alcohol use: No    Alcohol/week: 0.0 standard drinks    Comment: stopped in april 2014  . Drug use: Not Currently    Types: Cocaine     Allergies   Ace inhibitors   Review of Systems  Review of Systems  Musculoskeletal: Positive for back pain.  Neurological: Negative for weakness.  All other systems reviewed and are negative.    Physical Exam Updated Vital Signs BP (!) 149/108 (BP Location: Right Arm)   Pulse 72   Temp 98.3 F (36.8 C) (Oral)   Resp 16   Ht 6' (1.829 m)   Wt 77.1 kg   SpO2 100%   BMI 23.06 kg/m   Physical Exam Vitals signs and nursing note reviewed.  Constitutional:      General: He is not in acute distress.    Appearance: He is well-developed.  HENT:     Head: Normocephalic  and atraumatic.  Neck:     Musculoskeletal: Neck supple.  Cardiovascular:     Rate and Rhythm: Normal rate and regular rhythm.     Heart sounds: Normal heart sounds. No murmur.  Pulmonary:     Effort: Pulmonary effort is normal. No respiratory distress.     Breath sounds: Normal breath sounds.  Abdominal:     General: There is no distension.     Palpations: Abdomen is soft.     Tenderness: There is no abdominal tenderness.  Musculoskeletal:       Arms:     Comments: No midline C/T/L spine tenderness. Strong grip strength and pincer grasp. 2+ radial pulse.  Skin:    General: Skin is warm and dry.  Neurological:     Mental Status: He is alert and oriented to person, place, and time.      ED Treatments / Results  Labs (all labs ordered are listed, but only abnormal results are displayed) Labs Reviewed - No data to display  EKG None  Radiology No results found.  Procedures Procedures (including critical care time)  Medications Ordered in ED Medications  ketorolac (TORADOL) 15 MG/ML injection 15 mg (has no administration in time range)  cyclobenzaprine (FLEXERIL) tablet 10 mg (has no administration in time range)     Initial Impression / Assessment and Plan / ED Course  I have reviewed the triage vital signs and the nursing notes.  Pertinent labs & imaging results that were available during my care of the patient were reviewed by me and  considered in my medical decision making (see chart for details).       AVNER STRODER is a 58 y.o. male who presents to ED for persistent upper back pain with radiation down the hand consistent with cervical radiculopathy.  No significant neurologic deficits on exam.  He has not followed up with his orthopedist.  Strongly encouraged him to get in touch with Dr. Rolena Infante office.  Discussed that I would not refill any narcotics, but was happy to refill his muscle relaxant. Evaluation does not show pathology that would require ongoing emergent intervention or inpatient treatment. Return precautions discussed. Discharged in stable condition.   Final Clinical Impressions(s) / ED Diagnoses   Final diagnoses:  Cervical radiculopathy    ED Discharge Orders    None       Ward, Ozella Almond, PA-C 04/24/18 1444    Jola Schmidt, MD 04/24/18 [phone]

## 2018-04-24 NOTE — Discharge Instructions (Signed)
Please call Dr. Charmayne Sheer office to schedule a follow-up appointment.  Return to ER for new or worsening symptoms, any additional concerns.

## 2018-04-24 NOTE — ED Triage Notes (Signed)
Patient c/o right upper back pain x 1 month. Patient states his prescription ran out 2 weeks ago and continues to have pain.

## 2018-05-16 ENCOUNTER — Other Ambulatory Visit: Payer: Self-pay | Admitting: Internal Medicine

## 2018-06-12 ENCOUNTER — Other Ambulatory Visit: Payer: Self-pay | Admitting: Internal Medicine

## 2018-06-16 ENCOUNTER — Other Ambulatory Visit: Payer: Self-pay | Admitting: Internal Medicine

## 2018-07-12 ENCOUNTER — Other Ambulatory Visit: Payer: Self-pay | Admitting: Internal Medicine

## 2018-07-13 NOTE — Telephone Encounter (Signed)
Rx(s) sent to pharmacy electronically.  

## 2018-07-16 ENCOUNTER — Other Ambulatory Visit: Payer: Self-pay | Admitting: Internal Medicine

## 2018-07-25 ENCOUNTER — Other Ambulatory Visit: Payer: Self-pay | Admitting: Internal Medicine

## 2018-07-26 ENCOUNTER — Other Ambulatory Visit: Payer: Self-pay | Admitting: Internal Medicine

## 2018-08-07 ENCOUNTER — Other Ambulatory Visit: Payer: Self-pay | Admitting: Internal Medicine

## 2018-08-24 ENCOUNTER — Other Ambulatory Visit: Payer: Self-pay | Admitting: Internal Medicine

## 2018-09-03 ENCOUNTER — Other Ambulatory Visit: Payer: Self-pay | Admitting: Internal Medicine

## 2018-09-11 ENCOUNTER — Other Ambulatory Visit: Payer: Self-pay | Admitting: Internal Medicine

## 2018-09-24 ENCOUNTER — Other Ambulatory Visit: Payer: Self-pay | Admitting: Internal Medicine

## 2018-10-22 ENCOUNTER — Other Ambulatory Visit: Payer: Self-pay | Admitting: Internal Medicine

## 2018-10-23 ENCOUNTER — Other Ambulatory Visit: Payer: Self-pay | Admitting: Internal Medicine

## 2018-11-05 ENCOUNTER — Other Ambulatory Visit: Payer: Self-pay | Admitting: Internal Medicine

## 2018-11-20 ENCOUNTER — Other Ambulatory Visit: Payer: Self-pay | Admitting: Internal Medicine

## 2018-12-03 ENCOUNTER — Other Ambulatory Visit: Payer: Self-pay | Admitting: Internal Medicine

## 2018-12-10 ENCOUNTER — Other Ambulatory Visit: Payer: Self-pay | Admitting: Internal Medicine

## 2019-02-11 ENCOUNTER — Encounter (HOSPITAL_COMMUNITY): Payer: Self-pay | Admitting: Emergency Medicine

## 2019-02-11 ENCOUNTER — Emergency Department (HOSPITAL_COMMUNITY)
Admission: EM | Admit: 2019-02-11 | Discharge: 2019-02-12 | Disposition: A | Discharge status: Home / Self Care | Payer: Medicaid Other | Attending: Emergency Medicine | Admitting: Emergency Medicine

## 2019-02-11 ENCOUNTER — Other Ambulatory Visit: Payer: Self-pay

## 2019-02-11 DIAGNOSIS — I509 Heart failure, unspecified: Secondary | ICD-10-CM | POA: Insufficient documentation

## 2019-02-11 DIAGNOSIS — Z79899 Other long term (current) drug therapy: Secondary | ICD-10-CM | POA: Diagnosis not present

## 2019-02-11 DIAGNOSIS — F17210 Nicotine dependence, cigarettes, uncomplicated: Secondary | ICD-10-CM | POA: Diagnosis not present

## 2019-02-11 DIAGNOSIS — J449 Chronic obstructive pulmonary disease, unspecified: Secondary | ICD-10-CM | POA: Insufficient documentation

## 2019-02-11 DIAGNOSIS — R040 Epistaxis: Secondary | ICD-10-CM | POA: Insufficient documentation

## 2019-02-11 DIAGNOSIS — I10 Essential (primary) hypertension: Secondary | ICD-10-CM | POA: Diagnosis not present

## 2019-02-11 LAB — CBC WITH DIFFERENTIAL/PLATELET
Abs Immature Granulocytes: 0 10*3/uL (ref 0.00–0.07)
Basophils Absolute: 0 10*3/uL (ref 0.0–0.1)
Basophils Relative: 1 %
Eosinophils Absolute: 0.1 10*3/uL (ref 0.0–0.5)
Eosinophils Relative: 4 %
HCT: 41.4 % (ref 39.0–52.0)
Hemoglobin: 13.5 g/dL (ref 13.0–17.0)
Immature Granulocytes: 0 %
Lymphocytes Relative: 36 %
Lymphs Abs: 1.3 10*3/uL (ref 0.7–4.0)
MCH: 33.1 pg (ref 26.0–34.0)
MCHC: 32.6 g/dL (ref 30.0–36.0)
MCV: 101.5 fL — ABNORMAL HIGH (ref 80.0–100.0)
Monocytes Absolute: 0.4 10*3/uL (ref 0.1–1.0)
Monocytes Relative: 11 %
Neutro Abs: 1.7 10*3/uL (ref 1.7–7.7)
Neutrophils Relative %: 48 %
Platelets: 202 10*3/uL (ref 150–400)
RBC: 4.08 MIL/uL — ABNORMAL LOW (ref 4.22–5.81)
RDW: 12.7 % (ref 11.5–15.5)
WBC: 3.6 10*3/uL — ABNORMAL LOW (ref 4.0–10.5)
nRBC: 0 % (ref 0.0–0.2)

## 2019-02-11 LAB — COMPREHENSIVE METABOLIC PANEL
ALT: 28 U/L (ref 0–44)
AST: 32 U/L (ref 15–41)
Albumin: 3.6 g/dL (ref 3.5–5.0)
Alkaline Phosphatase: 42 U/L (ref 38–126)
Anion gap: 8 (ref 5–15)
BUN: 9 mg/dL (ref 6–20)
CO2: 24 mmol/L (ref 22–32)
Calcium: 8.4 mg/dL — ABNORMAL LOW (ref 8.9–10.3)
Chloride: 107 mmol/L (ref 98–111)
Creatinine, Ser: 1.08 mg/dL (ref 0.61–1.24)
GFR calc Af Amer: >60 mL/min (ref >60)
GFR calc non Af Amer: >60 mL/min (ref >60)
Glucose, Bld: 85 mg/dL (ref 70–99)
Potassium: 3.9 mmol/L (ref 3.5–5.1)
Sodium: 139 mmol/L (ref 135–145)
Total Bilirubin: 0.9 mg/dL (ref 0.3–1.2)
Total Protein: 6.5 g/dL (ref 6.5–8.1)

## 2019-02-11 LAB — PROTIME-INR
INR: 1 (ref 0.8–1.2)
Prothrombin Time: 12.8 seconds (ref 11.4–15.2)

## 2019-02-11 NOTE — ED Triage Notes (Signed)
Patient arrived with EMS from home reports intermittent epistaxis since Thursday last week . Denies injury /respirations unlabored , no anticoagulant , no bleeding at arrival .

## 2019-02-12 MED ORDER — OXYMETAZOLINE HCL 0.05 % NA SOLN
1.0000 | Freq: Once | NASAL | Status: AC
  Administered 2019-02-12: 1 via NASAL
  Filled 2019-02-12: qty 30

## 2019-02-12 NOTE — ED Provider Notes (Signed)
[facility] EMERGENCY DEPARTMENT Provider Note   CSN: [id number] Arrival date & time: 02/11/19  2012     History Chief Complaint  Patient presents with  . Epistaxis    Charles Cervantes is a 59 y.o. male.  The history is provided by the patient and medical records.  Epistaxis   59 year old male with history of CHF, chronic back pain, COPD, depression, hyperlipidemia, hypertension, sleep apnea, presenting to the ED with intermittent nosebleeds for the past 4 days.  States this woke her a handful of times a day, usually lasting 5 or 10 minutes at a time and will either stop spontaneously or with direct pressure.  States it is always from his left nostril.  He denies any falls or facial trauma.  He is not currently on anticoagulation.  He has not had any headaches, dizziness, confusion, numbness, or weakness.  Past Medical History:  Diagnosis Date  . CHF (congestive heart failure) (HCC)    takes Furosemide daily  . Chronic back pain   . Constipation    takes Colace daily  . COPD (chronic obstructive pulmonary disease) (HCC)    Spiriva daily  . Depression    takes Effexor daily and Risperdal nightly  . Dizziness   . H/O blood clots 2012   in leg  . Headache    occasionally  . Hyperlipidemia   . Hypertension    takes Losartan and Coreg daily  . Muscle spasm    takes Zizanidine daily as needed  . Shortness of breath dyspnea    more with exertion but sometimes notices with lying/sitting  . Sleep apnea   . Weakness    tingling and numbness    Patient Active Problem List   Diagnosis Date Noted  . Frequent No-show for appointment 02/08/2016  . NICM (nonischemic cardiomyopathy) ([place]) 12/04/2015  . Back pain 09/03/2014  . Spinal stenosis in cervical region 04/21/2014  . Cervical disc disorder with radiculopathy of cervical region 04/21/2014  . COPD (chronic obstructive pulmonary disease) ([place]) 04/18/2014  . Right knee buckling 04/18/2014  . Chronic low  back pain 03/17/2014  . Myofascial pain syndrome 01/20/2014  . Anxiety and depression 11/04/2013  . PTSD (post-traumatic stress disorder) 11/04/2013  . Neuropathic pain 09/12/2013  . Healthcare maintenance 09/12/2013  . OSA (obstructive sleep apnea) 03/26/2013  . HTN (hypertension) 01/01/2013  . Chest pain 11/16/2012  . Current smoker 09/05/2012  . Chronic systolic heart failure ([place]) 05/08/2012  . Polysubstance abuse ([place]) 05/08/2012  . Alcohol abuse 05/01/2012  . Cocaine abuse ([place]) 05/01/2012    Past Surgical History:  Procedure Laterality Date  . LEFT AND RIGHT HEART CATHETERIZATION WITH CORONARY ANGIOGRAM N/A 11/20/2012   Procedure: LEFT AND RIGHT HEART CATHETERIZATION WITH CORONARY ANGIOGRAM;  Surgeon: Jolaine Artist, MD;  Location: [facility] CATH LAB;  Service: Cardiovascular;  Laterality: N/A;  . Left Second finger surgery    . LUMBAR FUSION  09/05/2014   L5 S1       No family history on file.  Social History   Tobacco Use  . Smoking status: Current Some Day Smoker    Packs/day: 0.25    Years: 20.00    Pack years: 5.00    Types: Cigarettes  . Smokeless tobacco: Never Used  . Tobacco comment: 1-2 cigs per day (08/15/14)  Substance Use Topics  . Alcohol use: No    Alcohol/week: 0.0 standard drinks    Comment: stopped in april 2014  . Drug use: Not Currently  Types: Cocaine    Home Medications Prior to Admission medications   Medication Sig Start Date End Date Taking? Authorizing Provider  albuterol (PROVENTIL HFA;VENTOLIN HFA) 108 (90 BASE) MCG/ACT inhaler Inhale 2 puffs into the lungs every 6 (six) hours as needed for wheezing or shortness of breath. 04/18/14   Vivi Barrack, MD  carvedilol (COREG) 6.25 MG tablet Take 1 tablet (6.25 mg total) by mouth 2 (two) times daily with a meal. Please make annual appt for future refills. [phone]. 1st attempt. 12/10/18   Hilty, Nadean Corwin, MD  cyclobenzaprine (FLEXERIL) 10 MG tablet Take 1 tablet (10 mg total) by  mouth 2 (two) times daily as needed for muscle spasms. 04/24/18   Ward, Ozella Almond, PA-C  furosemide (LASIX) 40 MG tablet Take 1 tablet (40 mg total) by mouth daily. 12/04/18   Hilty, Nadean Corwin, MD  gabapentin (NEURONTIN) 300 MG capsule Take 1 capsule (300 mg total) by mouth 4 (four) times daily. 03/18/15   Kirsteins, Luanna Salk, MD  HYDROcodone-acetaminophen (NORCO) 10-325 MG tablet Take 1 tablet by mouth 3 (three) times daily. 10/07/14   Vivi Barrack, MD  losartan (COZAAR) 100 MG tablet Take 1 tablet (100 mg total) by mouth daily. 12/10/18   Hilty, Nadean Corwin, MD  methocarbamol (ROBAXIN) 500 MG tablet Take 1 tablet (500 mg total) by mouth 3 (three) times daily as needed for muscle spasms. 09/03/14   Melina Schools, MD  oxyCODONE-acetaminophen (PERCOCET) 10-325 MG per tablet Take 1 tablet by mouth every 4 (four) hours as needed for pain. 09/03/14   Melina Schools, MD  predniSONE (DELTASONE) 50 MG tablet Take 1 tablet (50 mg total) by mouth daily. 04/15/18   Lawyer, Harrell Gave, PA-C  risperiDONE (RISPERDAL) 3 MG tablet Take 1 tablet by mouth at bedtime. 04/21/14   [provider]  traMADol (ULTRAM) 50 MG tablet Take 1 tablet (50 mg total) by mouth every 6 (six) hours as needed for severe pain. 04/15/18   Lawyer, Harrell Gave, PA-C    Allergies    Ace inhibitors  Review of Systems   Review of Systems  HENT: Positive for nosebleeds.   All other systems reviewed and are negative.   Physical Exam Updated Vital Signs BP (!) 172/106 (BP Location: Right Arm)   Pulse 64   Temp 98.3 F (36.8 C) (Oral)   Resp 16   SpO2 100%   Physical Exam Vitals and nursing note reviewed.  Constitutional:      Appearance: He is well-developed.  HENT:     Head: Normocephalic and atraumatic.     Comments: Face is atraumatic in appearance, no deformity of the nose, nostrils are clear bilaterally, no septal hematoma or deformity noted, oropharynx clear Eyes:     Conjunctiva/sclera: Conjunctivae normal.      Pupils: Pupils are equal, round, and reactive to light.  Cardiovascular:     Rate and Rhythm: Normal rate and regular rhythm.     Heart sounds: Normal heart sounds.  Pulmonary:     Effort: Pulmonary effort is normal. No respiratory distress.     Breath sounds: Normal breath sounds. No stridor.  Abdominal:     General: Bowel sounds are normal.     Palpations: Abdomen is soft.     Tenderness: There is no guarding.     Hernia: No hernia is present.  Musculoskeletal:        General: Normal range of motion.     Cervical back: Normal range of motion.  Skin:  General: Skin is warm and dry.  Neurological:     Mental Status: He is alert and oriented to person, place, and time.     ED Results / Procedures / Treatments   Labs (all labs ordered are listed, but only abnormal results are displayed) Labs Reviewed  CBC WITH DIFFERENTIAL/PLATELET - Abnormal; Notable for the following components:      Result Value   WBC 3.6 (*)    RBC 4.08 (*)    MCV 101.5 (*)    All other components within normal limits  COMPREHENSIVE METABOLIC PANEL - Abnormal; Notable for the following components:   Calcium 8.4 (*)    All other components within normal limits  PROTIME-INR    EKG None  Radiology No results found.  Procedures Procedures (including critical care time)  Medications Ordered in ED Medications  oxymetazoline (AFRIN) 0.05 % nasal spray 1 spray (1 spray Each Nare Given 02/12/19 0234)    ED Course  I have reviewed the triage vital signs and the nursing notes.  Pertinent labs & imaging results that were available during my care of the patient were reviewed by me and considered in my medical decision making (see chart for details).    MDM Rules/Calculators/A&P  59 year old male presenting to the ED with intermittent nosebleeds for the past 4 days.  He is not had any falls or facial trauma.  He is not currently on anticoagulation.  He is afebrile and nontoxic in appearance.  He has  no active bleeding on exam (states last nosebleed was 6+ hours ago)  There is no apparent septal hematoma or deformity.  There is no dried blood or clots noted in the nostrils.  Nasal passages are grossly clear.  Labs are reassuring, hemoglobin and platelets are within normal limits.  Symptoms may be due to dry air which I discussed with patient.  We have discussed home humidifier, steam, etc.  He was given Afrin here and instructed on use, specifically not using for more than 3 consecutive days.  Can follow-up with his primary care doctor.  He may return here for any new or acute changes.  Final Clinical Impression(s) / ED Diagnoses Final diagnoses:  Epistaxis    Rx / DC Orders ED Discharge Orders    None       Larene Pickett, PA-C 02/12/19 [place], [place], DO 02/12/19 [phone]

## 2019-02-12 NOTE — Discharge Instructions (Addendum)
Use the afrin as directed-- 2 sprays in the nostril and hold pressure for 5-10 mins. If no bleeding, do not need to use afrin. Do not use afrin for more than 3 days in a row as this can worsen bleeding. Recommend to follow-up with your primary care doctor. Return here for any new/acute changes.

## 2019-08-26 ENCOUNTER — Other Ambulatory Visit: Payer: Self-pay | Admitting: Internal Medicine

## 2019-08-27 NOTE — Telephone Encounter (Signed)
error 

## 2019-08-30 ENCOUNTER — Other Ambulatory Visit: Payer: Self-pay | Admitting: Internal Medicine

## 2019-09-02 ENCOUNTER — Telehealth: Payer: Self-pay | Admitting: Internal Medicine

## 2019-09-02 NOTE — Telephone Encounter (Signed)
*  STAT* If patient is at the pharmacy, call can be transferred to refill team.   1. Which medications need to be refilled? (please list name of each medication and dose if known) new prescriptions for Furosemide, Losartan and  Carvedilol  2. Which pharmacy/location (including street and city if local pharmacy) is medication to be sent to? [facility]  3. Do they need a 30 day or 90 day supply?90 days and refills- need this today

## 2019-09-03 ENCOUNTER — Other Ambulatory Visit: Payer: Self-pay

## 2019-09-14 ENCOUNTER — Other Ambulatory Visit: Payer: Self-pay | Admitting: Internal Medicine

## 2019-09-15 NOTE — Progress Notes (Deleted)
Cardiology Office Note   Date:  09/15/2019   ID:  Charles Cervantes, DOB 1960-05-28, MRN [id number]  PCP:  [name], MD  Cardiologist:  No primary care provider on file. EP: None  No chief complaint on file.     History of Present Illness: Charles Cervantes is a 59 y.o. male with PMH of mild non-obstructive CAD, chronic combined CHF with subsequent recovery in EF, non-ischemic cardiomyopathy, HTN, HLD, COPD, polysubstance abuse (cocaine and alcohol) - quit in 2014, and tobacco abuse who presents for ***  It appears his NICM was diagnosed in 2014 in [place] and he was subsequently admitted to [facility] 04/2012 shortly after moving to [place]. He was found to have an EF of 20-25% on echo at that time. He was followed in the [facility] after discharge, however he was dismissed from their practice in 2016 after confrontational behavior issues. He was last evaluated by cardiology at an outpatient visit with Dr. Debara Pickett in 2017. His blood pressure was elevated at that time and he was reported to be euvolemic on exam. Medication compliance was encouraged and he was recommended to follow-up in 1 year, however was lost to follow-up. His last echocardiogram in 2016 showed EF 55-60%, G1DD, mild concentric LVH, and mild LAE. His last ischemic evaluation was a R/LHC in 2014 which showed 20-30% p-mLAD, otherwise normal coronaries, with global hypokinesis worse at apex and EF 35-40%.  He presents today to re-establish cardiology care.  1. Non-obstructive CAD: mild LAD disease noted on LHC in 2014. No anginal complaints  - Would start aspirin 81mg   - Will check FLP at his convenience to determine need for statin - Continue carvedilol  2. NICM/chronic combined CHF: initially diagnosed in 2014 with EF 20-25% with subsequent recovery of his EF to 55-60% on his last echo in 2016. Suspect this may have been HTN mediated - Continue carvedilol, losartan, and prn lasix  3. HTN: BP ***  today - Continue carvedilol, losartan, and prn lasix  5. Tobacco abuse:  - Continue to encourage cessation  6. Cocaine/Etoh abuse: denies use since 2014 - Continue to encourage abstinence    Past Medical History:  Diagnosis Date  . CHF (congestive heart failure) (HCC)    takes Furosemide daily  . Chronic back pain   . Constipation    takes Colace daily  . COPD (chronic obstructive pulmonary disease) (HCC)    Spiriva daily  . Depression    takes Effexor daily and Risperdal nightly  . Dizziness   . H/O blood clots 2012   in leg  . Headache    occasionally  . Hyperlipidemia   . Hypertension    takes Losartan and Coreg daily  . Muscle spasm    takes Zizanidine daily as needed  . Shortness of breath dyspnea    more with exertion but sometimes notices with lying/sitting  . Sleep apnea   . Weakness    tingling and numbness    Past Surgical History:  Procedure Laterality Date  . LEFT AND RIGHT HEART CATHETERIZATION WITH CORONARY ANGIOGRAM N/A 11/20/2012   Procedure: LEFT AND RIGHT HEART CATHETERIZATION WITH CORONARY ANGIOGRAM;  Surgeon: Jolaine Artist, MD;  Location: [facility] CATH LAB;  Service: Cardiovascular;  Laterality: N/A;  . Left Second finger surgery    . LUMBAR FUSION  09/05/2014   L5 S1     Current Outpatient Medications  Medication Sig Dispense Refill  . albuterol (PROVENTIL HFA;VENTOLIN HFA) 108 (90 BASE)  MCG/ACT inhaler Inhale 2 puffs into the lungs every 6 (six) hours as needed for wheezing or shortness of breath. 1 Inhaler 2  . carvedilol (COREG) 6.25 MG tablet Take 1 tablet (6.25 mg total) by mouth 2 (two) times daily with a meal. Please make annual appt for future refills. [phone]. 1st attempt. 30 tablet 0  . cyclobenzaprine (FLEXERIL) 10 MG tablet Take 1 tablet (10 mg total) by mouth 2 (two) times daily as needed for muscle spasms. 20 tablet 0  . furosemide (LASIX) 40 MG tablet Take 1 tablet (40 mg total) by mouth daily. 7 tablet 0  . gabapentin  (NEURONTIN) 300 MG capsule Take 1 capsule (300 mg total) by mouth 4 (four) times daily. 120 capsule 0  . HYDROcodone-acetaminophen (NORCO) 10-325 MG tablet Take 1 tablet by mouth 3 (three) times daily. 30 tablet 0  . losartan (COZAAR) 100 MG tablet Take 1 tablet (100 mg total) by mouth daily. 7 tablet 0  . methocarbamol (ROBAXIN) 500 MG tablet Take 1 tablet (500 mg total) by mouth 3 (three) times daily as needed for muscle spasms. 60 tablet 0  . oxyCODONE-acetaminophen (PERCOCET) 10-325 MG per tablet Take 1 tablet by mouth every 4 (four) hours as needed for pain. 60 tablet 0  . predniSONE (DELTASONE) 50 MG tablet Take 1 tablet (50 mg total) by mouth daily. 5 tablet 0  . risperiDONE (RISPERDAL) 3 MG tablet Take 1 tablet by mouth at bedtime.  0  . traMADol (ULTRAM) 50 MG tablet Take 1 tablet (50 mg total) by mouth every 6 (six) hours as needed for severe pain. 15 tablet 0   No current facility-administered medications for this visit.    Allergies:   Ace inhibitors    Social History:  The patient  reports that he has been smoking cigarettes. He has a 5.00 pack-year smoking history. He has never used smokeless tobacco. He reports previous drug use. Drug: Cocaine. He reports that he does not drink alcohol.   Family History:  The patient's ***family history is not on file.    ROS:  Please see the history of present illness.   Otherwise, review of systems are positive for {NONE DEFAULTED:18576::"none"}.   All other systems are reviewed and negative.    PHYSICAL EXAM: VS:  There were no vitals taken for this visit. , BMI There is no height or weight on file to calculate BMI. GEN: Well nourished, well developed, in no acute distress HEENT: normal Neck: no JVD, carotid bruits, or masses Cardiac: ***RRR; no murmurs, rubs, or gallops,no edema  Respiratory:  clear to auscultation bilaterally, normal work of breathing GI: soft, nontender, nondistended, + BS MS: no deformity or atrophy Skin: warm  and dry, no rash Neuro:  Strength and sensation are intact Psych: euthymic mood, full affect   EKG:  EKG {ACTION; IS/IS QPY:19509326} ordered today. The ekg ordered today demonstrates ***   Recent Labs: 02/11/2019: ALT 28; BUN 9; Creatinine, Ser 1.08; Hemoglobin 13.5; Platelets 202; Potassium 3.9; Sodium 139    Lipid Panel    Component Value Date/Time   CHOL 195 04/18/2014 1532   TRIG 215 (H) 04/18/2014 1532   HDL 46 04/18/2014 1532   CHOLHDL 4.2 04/18/2014 1532   VLDL 43 (H) 04/18/2014 1532   LDLCALC 106 (H) 04/18/2014 1532      Wt Readings from Last 3 Encounters:  04/24/18 170 lb (77.1 kg)  04/15/18 160 lb (72.6 kg)  03/19/18 160 lb (72.6 kg)      Other  studies Reviewed: Additional studies/ records that were reviewed today include:   R/LHC 2014: Left main:  Calcified. No significant stenosis.   LAD: Long vessel wrapping the apex. Gives off large D1 and small D2. Extensive calcification in proximal and mid LAD with only mild intraluminal stenosis with 20-30% stenosis.  LCX: Gives off very  branching ramus which is normal. AV groove LCX is small and normal.  RCA: Large dominant vessel. Normal.   LV-gram done in the RAO projection: Ejection fraction = ~35-40% with global HK worse at apex  Assessment: 1. Mild non-obstructive CAD 2. Normal right heart cath 3. Mild to moderate LV dysfunction 4. No evidence intracardiac shunt  Plan/Discussion:  Minimal CAD. His hemodynamics are essentially normal and don't correlate well with his symptoms. Weight gain does not seem related to HF. Continue medical therapy.   Echocardiogram 2016: - Left ventricle: The cavity size was normal. There was mild  concentric hypertrophy. Systolic function was normal. The  estimated ejection fraction was in the range of 55% to 60%. Wall  motion was normal; there were no regional wall motion  abnormalities. Doppler parameters are consistent with abnormal  left ventricular  relaxation (grade 1 diastolic dysfunction).  - Left atrium: The atrium was mildly dilated.    ASSESSMENT AND PLAN:  1.  ***   Current medicines are reviewed at length with the patient today.  The patient {ACTIONS; HAS/DOES NOT HAVE:19233} concerns regarding medicines.  The following changes have been made:  {PLAN; NO CHANGE:13088:s}  Labs/ tests ordered today include: *** No orders of the defined types were placed in this encounter.    Disposition:   FU with *** in {gen number 3-84:[id number]} {Days to years:10300}  Signed, Abigail Butts, PA-C  09/15/2019 1:01 PM

## 2019-09-17 ENCOUNTER — Emergency Department (HOSPITAL_COMMUNITY): Payer: Medicaid Other

## 2019-09-17 ENCOUNTER — Inpatient Hospital Stay (HOSPITAL_COMMUNITY)
Admission: EM | Admit: 2019-09-17 | Discharge: 2019-09-20 | DRG: 917 | Disposition: A | Discharge status: Home / Self Care | Payer: Medicaid Other | Attending: Internal Medicine | Admitting: Internal Medicine

## 2019-09-17 ENCOUNTER — Ambulatory Visit: Payer: Medicaid Other | Admitting: Medical

## 2019-09-17 DIAGNOSIS — G4733 Obstructive sleep apnea (adult) (pediatric): Secondary | ICD-10-CM | POA: Diagnosis present

## 2019-09-17 DIAGNOSIS — I5023 Acute on chronic systolic (congestive) heart failure: Secondary | ICD-10-CM | POA: Diagnosis not present

## 2019-09-17 DIAGNOSIS — F1410 Cocaine abuse, uncomplicated: Secondary | ICD-10-CM | POA: Diagnosis not present

## 2019-09-17 DIAGNOSIS — F17200 Nicotine dependence, unspecified, uncomplicated: Secondary | ICD-10-CM | POA: Diagnosis not present

## 2019-09-17 DIAGNOSIS — T405X1A Poisoning by cocaine, accidental (unintentional), initial encounter: Principal | ICD-10-CM | POA: Diagnosis present

## 2019-09-17 DIAGNOSIS — E440 Moderate protein-calorie malnutrition: Secondary | ICD-10-CM | POA: Diagnosis present

## 2019-09-17 DIAGNOSIS — N179 Acute kidney failure, unspecified: Secondary | ICD-10-CM | POA: Diagnosis present

## 2019-09-17 DIAGNOSIS — Z981 Arthrodesis status: Secondary | ICD-10-CM

## 2019-09-17 DIAGNOSIS — Z20822 Contact with and (suspected) exposure to covid-19: Secondary | ICD-10-CM | POA: Diagnosis present

## 2019-09-17 DIAGNOSIS — E876 Hypokalemia: Secondary | ICD-10-CM | POA: Diagnosis present

## 2019-09-17 DIAGNOSIS — I509 Heart failure, unspecified: Secondary | ICD-10-CM

## 2019-09-17 DIAGNOSIS — I10 Essential (primary) hypertension: Secondary | ICD-10-CM | POA: Diagnosis present

## 2019-09-17 DIAGNOSIS — Z681 Body mass index (BMI) 19 or less, adult: Secondary | ICD-10-CM

## 2019-09-17 DIAGNOSIS — I428 Other cardiomyopathies: Secondary | ICD-10-CM | POA: Diagnosis not present

## 2019-09-17 DIAGNOSIS — I160 Hypertensive urgency: Secondary | ICD-10-CM | POA: Diagnosis present

## 2019-09-17 DIAGNOSIS — I110 Hypertensive heart disease with heart failure: Secondary | ICD-10-CM | POA: Diagnosis present

## 2019-09-17 DIAGNOSIS — F17210 Nicotine dependence, cigarettes, uncomplicated: Secondary | ICD-10-CM | POA: Diagnosis present

## 2019-09-17 LAB — BRAIN NATRIURETIC PEPTIDE: B Natriuretic Peptide: 570.5 pg/mL — ABNORMAL HIGH (ref 0.0–100.0)

## 2019-09-17 LAB — CBC WITH DIFFERENTIAL/PLATELET
Abs Immature Granulocytes: 0.01 10*3/uL (ref 0.00–0.07)
Basophils Absolute: 0 10*3/uL (ref 0.0–0.1)
Basophils Relative: 1 %
Eosinophils Absolute: 0.2 10*3/uL (ref 0.0–0.5)
Eosinophils Relative: 4 %
HCT: 49.9 % (ref 39.0–52.0)
Hemoglobin: 16.1 g/dL (ref 13.0–17.0)
Immature Granulocytes: 0 %
Lymphocytes Relative: 35 %
Lymphs Abs: 1.3 10*3/uL (ref 0.7–4.0)
MCH: 31.6 pg (ref 26.0–34.0)
MCHC: 32.3 g/dL (ref 30.0–36.0)
MCV: 98 fL (ref 80.0–100.0)
Monocytes Absolute: 0.5 10*3/uL (ref 0.1–1.0)
Monocytes Relative: 14 %
Neutro Abs: 1.8 10*3/uL (ref 1.7–7.7)
Neutrophils Relative %: 46 %
Platelets: 208 10*3/uL (ref 150–400)
RBC: 5.09 MIL/uL (ref 4.22–5.81)
RDW: 13.1 % (ref 11.5–15.5)
WBC: 3.8 10*3/uL — ABNORMAL LOW (ref 4.0–10.5)
nRBC: 0 % (ref 0.0–0.2)

## 2019-09-17 LAB — LIPID PANEL
Cholesterol: 174 mg/dL (ref 0–200)
HDL: 99 mg/dL (ref >40)
LDL Cholesterol: 65 mg/dL (ref 0–99)
Total CHOL/HDL Ratio: 1.8 RATIO
Triglycerides: 52 mg/dL (ref <150)
VLDL: 10 mg/dL (ref 0–40)

## 2019-09-17 LAB — COMPREHENSIVE METABOLIC PANEL
ALT: 54 U/L — ABNORMAL HIGH (ref 0–44)
AST: 69 U/L — ABNORMAL HIGH (ref 15–41)
Albumin: 3.8 g/dL (ref 3.5–5.0)
Alkaline Phosphatase: 60 U/L (ref 38–126)
Anion gap: 11 (ref 5–15)
BUN: 15 mg/dL (ref 6–20)
CO2: 21 mmol/L — ABNORMAL LOW (ref 22–32)
Calcium: 8.8 mg/dL — ABNORMAL LOW (ref 8.9–10.3)
Chloride: 108 mmol/L (ref 98–111)
Creatinine, Ser: 1.29 mg/dL — ABNORMAL HIGH (ref 0.61–1.24)
GFR calc Af Amer: >60 mL/min (ref >60)
GFR calc non Af Amer: >60 mL/min (ref >60)
Glucose, Bld: 101 mg/dL — ABNORMAL HIGH (ref 70–99)
Potassium: 5.4 mmol/L — ABNORMAL HIGH (ref 3.5–5.1)
Sodium: 140 mmol/L (ref 135–145)
Total Bilirubin: 1.5 mg/dL — ABNORMAL HIGH (ref 0.3–1.2)
Total Protein: 6.8 g/dL (ref 6.5–8.1)

## 2019-09-17 LAB — PROTIME-INR
INR: 0.9 (ref 0.8–1.2)
Prothrombin Time: 12.2 seconds (ref 11.4–15.2)

## 2019-09-17 LAB — SARS CORONAVIRUS 2 BY RT PCR (HOSPITAL ORDER, PERFORMED IN ~~LOC~~ HOSPITAL LAB): SARS Coronavirus 2: NEGATIVE

## 2019-09-17 LAB — HEMOGLOBIN A1C
Hgb A1c MFr Bld: 5 % (ref 4.8–5.6)
Mean Plasma Glucose: 96.8 mg/dL

## 2019-09-17 LAB — TROPONIN I (HIGH SENSITIVITY)
Troponin I (High Sensitivity): 18 ng/L — ABNORMAL HIGH (ref <18)
Troponin I (High Sensitivity): 16 ng/L (ref <18)

## 2019-09-17 LAB — APTT: aPTT: 22 seconds — ABNORMAL LOW (ref 24–36)

## 2019-09-17 MED ORDER — ALBUTEROL SULFATE (2.5 MG/3ML) 0.083% IN NEBU: 2.5000 mg | INHALATION_SOLUTION | RESPIRATORY_TRACT | Status: DC | PRN

## 2019-09-17 MED ORDER — LOSARTAN POTASSIUM 50 MG PO TABS: 100.0000 mg | ORAL_TABLET | Freq: Every day | ORAL | Status: DC

## 2019-09-17 MED ORDER — IOHEXOL 350 MG/ML SOLN
INTRAVENOUS | Status: AC
  Filled 2019-09-17: qty 1

## 2019-09-17 MED ORDER — HEPARIN SODIUM (PORCINE) 1000 UNIT/ML IJ SOLN
INTRAMUSCULAR | Status: AC
  Filled 2019-09-17: qty 1

## 2019-09-17 MED ORDER — ASPIRIN 81 MG PO CHEW
324.0000 mg | CHEWABLE_TABLET | Freq: Once | ORAL | Status: AC
  Administered 2019-09-17: 324 mg via ORAL

## 2019-09-17 MED ORDER — HEPARIN (PORCINE) IN NACL 1000-0.9 UT/500ML-% IV SOLN
INTRAVENOUS | Status: AC
  Filled 2019-09-17: qty 1000

## 2019-09-17 MED ORDER — SODIUM CHLORIDE 0.9% FLUSH: 3.0000 mL | INTRAVENOUS | Status: DC | PRN

## 2019-09-17 MED ORDER — ONDANSETRON HCL 4 MG/2ML IJ SOLN: 4.0000 mg | Freq: Four times a day (QID) | INTRAMUSCULAR | Status: DC | PRN

## 2019-09-17 MED ORDER — NITROGLYCERIN 1 MG/10 ML FOR IR/CATH LAB
INTRA_ARTERIAL | Status: AC
  Filled 2019-09-17: qty 10

## 2019-09-17 MED ORDER — FUROSEMIDE 10 MG/ML IJ SOLN
60.0000 mg | Freq: Once | INTRAMUSCULAR | Status: AC
  Administered 2019-09-17: 60 mg via INTRAVENOUS
  Filled 2019-09-17: qty 6

## 2019-09-17 MED ORDER — VERAPAMIL HCL 2.5 MG/ML IV SOLN
INTRAVENOUS | Status: AC
  Filled 2019-09-17: qty 2

## 2019-09-17 MED ORDER — IRBESARTAN 150 MG PO TABS
150.0000 mg | ORAL_TABLET | Freq: Every day | ORAL | Status: DC
  Administered 2019-09-17 – 2019-09-20 (×3): 150 mg via ORAL
  Filled 2019-09-17 (×4): qty 1

## 2019-09-17 MED ORDER — ENOXAPARIN SODIUM 40 MG/0.4ML ~~LOC~~ SOLN
40.0000 mg | Freq: Every day | SUBCUTANEOUS | Status: DC
  Administered 2019-09-17 – 2019-09-19 (×3): 40 mg via SUBCUTANEOUS
  Filled 2019-09-17 (×2): qty 0.4

## 2019-09-17 MED ORDER — NITROGLYCERIN IN D5W 200-5 MCG/ML-% IV SOLN
0.0000 ug/min | INTRAVENOUS | Status: DC
  Administered 2019-09-17: 10 ug/min via INTRAVENOUS
  Filled 2019-09-17: qty 250

## 2019-09-17 MED ORDER — SODIUM CHLORIDE 0.9 % IV SOLN: 250.0000 mL | INTRAVENOUS | Status: DC | PRN

## 2019-09-17 MED ORDER — FUROSEMIDE 10 MG/ML IJ SOLN
60.0000 mg | Freq: Two times a day (BID) | INTRAMUSCULAR | Status: DC
  Administered 2019-09-18 – 2019-09-20 (×5): 60 mg via INTRAVENOUS
  Filled 2019-09-17 (×5): qty 6

## 2019-09-17 MED ORDER — LIDOCAINE HCL (PF) 1 % IJ SOLN
INTRAMUSCULAR | Status: AC
  Filled 2019-09-17: qty 30

## 2019-09-17 MED ORDER — ALBUTEROL SULFATE HFA 108 (90 BASE) MCG/ACT IN AERS: 2.0000 | INHALATION_SPRAY | Freq: Four times a day (QID) | RESPIRATORY_TRACT | Status: DC | PRN

## 2019-09-17 MED ORDER — ACETAMINOPHEN 325 MG PO TABS
650.0000 mg | ORAL_TABLET | ORAL | Status: DC | PRN
  Administered 2019-09-17 – 2019-09-18 (×3): 650 mg via ORAL
  Filled 2019-09-17 (×3): qty 2

## 2019-09-17 MED ORDER — CARVEDILOL 6.25 MG PO TABS
6.2500 mg | ORAL_TABLET | Freq: Two times a day (BID) | ORAL | Status: DC
  Administered 2019-09-18 – 2019-09-20 (×5): 6.25 mg via ORAL
  Filled 2019-09-17 (×4): qty 1
  Filled 2019-09-17: qty 2

## 2019-09-17 MED ORDER — SODIUM CHLORIDE 0.9% FLUSH
3.0000 mL | Freq: Two times a day (BID) | INTRAVENOUS | Status: DC
  Administered 2019-09-17 – 2019-09-20 (×5): 3 mL via INTRAVENOUS

## 2019-09-17 NOTE — ED Triage Notes (Signed)
Pt here with worsening shob and cp onset 2pm today. Pt with hx chf and states the shob is normal but the cp is new.

## 2019-09-17 NOTE — H&P (Signed)
History and Physical    Charles Cervantes PFX:[id number] DOB: [birth year] DOA: 09/17/2019  PCP: [name], MD  Patient coming from: Home  I have personally briefly reviewed patient's old medical records in [place]  Chief Complaint: CP, SOB  HPI: Charles Cervantes is a 59 y.o. male with medical history significant of HFrEF with EF 20-25% in 2014, improving over time to normal.  Current and ongoing cociane abuse, last use was last night.  Current smoking.  No ongoing EtOH abuse he tells me.  Pt presents to ED with CP and SOB.  Initially concern for STEMI but not felt to be STEMI by cards (see their consult note).   ED Course: Not felt to be STEMI by cards.  BP 164/118.  Admits last cocaine use was yesterday.  Ongoing smoking.  Denies other substance or EtOH use.  Started on lasix and NTG gtt by cards, hospitalist asked to admit.   Review of Systems: As per HPI, otherwise all review of systems negative.  Past Medical History:  Diagnosis Date  . CHF (congestive heart failure) (HCC)    takes Furosemide daily  . Chronic back pain   . Constipation    takes Colace daily  . COPD (chronic obstructive pulmonary disease) (HCC)    Spiriva daily  . Depression    takes Effexor daily and Risperdal nightly  . Dizziness   . H/O blood clots 2012   in leg  . Headache    occasionally  . Hyperlipidemia   . Hypertension    takes Losartan and Coreg daily  . Muscle spasm    takes Zizanidine daily as needed  . Shortness of breath dyspnea    more with exertion but sometimes notices with lying/sitting  . Sleep apnea   . Weakness    tingling and numbness    Past Surgical History:  Procedure Laterality Date  . LEFT AND RIGHT HEART CATHETERIZATION WITH CORONARY ANGIOGRAM N/A 11/20/2012   Procedure: LEFT AND RIGHT HEART CATHETERIZATION WITH CORONARY ANGIOGRAM;  Surgeon: Jolaine Artist, MD;  Location: [facility] C[facility] LAB;  Service: Cardiovascular;  Laterality: N/A;  . Left Second  finger surgery    . LUMBAR FUSION  09/05/2014   L5 S1     reports that he has been smoking cigarettes. He has a 5.00 pack-year smoking history. He has never used smokeless tobacco. He reports previous drug use. Drug: Cocaine. He reports that he does not drink alcohol.  Allergies  Allergen Reactions  . Ace Inhibitors Swelling    angioedema    No family history on file.   Prior to Admission medications   Medication Sig Start Date End Date Taking? Authorizing Provider  albuterol (PROVENTIL HFA;VENTOLIN HFA) 108 (90 BASE) MCG/ACT inhaler Inhale 2 puffs into the lungs every 6 (six) hours as needed for wheezing or shortness of breath. 04/18/14  Yes Vivi Barrack, MD  carvedilol (COREG) 6.25 MG tablet Take 1 tablet (6.25 mg total) by mouth 2 (two) times daily with a meal. Please make annual appt for future refills. [phone]. 1st attempt. 09/16/19  Yes Hilty, Nadean Corwin, MD  furosemide (LASIX) 40 MG tablet Take 1 tablet (40 mg total) by mouth daily. 08/26/19  Yes Hilty, Nadean Corwin, MD  losartan (COZAAR) 100 MG tablet Take 1 tablet (100 mg total) by mouth daily. 08/26/19  Yes Pixie Casino, MD    Physical Exam: Vitals:   09/17/19 1745 09/17/19 1900 09/17/19 2000 09/17/19 2127  BP: (!) 163/118 (!) 169/114  Pulse: (!) 59 (!) 57    Resp: (!) 24 (!) 23    Temp:   (!) 97.4 F (36.3 C)   TempSrc:   Temporal   SpO2: 100% 100%    Weight:    68 kg  Height:    6\' 1"  (1.854 m)    Constitutional: NAD, calm, comfortable Eyes: PERRL, lids and conjunctivae normal ENMT: Mucous membranes are moist. Posterior pharynx clear of any exudate or lesions.Normal dentition.  Neck: normal, supple, no masses, no thyromegaly Respiratory: clear to auscultation bilaterally, no wheezing, no crackles. Normal respiratory effort. No accessory muscle use.  Cardiovascular: Regular rate and rhythm, no murmurs / rubs / gallops. No extremity edema. 2+ pedal pulses. No carotid bruits.  Abdomen: no tenderness, no  masses palpated. No hepatosplenomegaly. Bowel sounds positive.  Musculoskeletal: no clubbing / cyanosis. No joint deformity upper and lower extremities. Good ROM, no contractures. Normal muscle tone.  Skin: no rashes, lesions, ulcers. No induration Neurologic: CN 2-12 grossly intact. Sensation intact, DTR normal. Strength 5/5 in all 4.  Psychiatric: Normal judgment and insight. Alert and oriented x 3. Normal mood.    Labs on Admission: I have personally reviewed following labs and imaging studies  CBC: Recent Labs  Lab 09/17/19 1710  WBC 3.8*  NEUTROABS 1.8  HGB 16.1  HCT 49.9  MCV 98.0  PLT 211   Basic Metabolic Panel: Recent Labs  Lab 09/17/19 1710  NA 140  K 5.4*  CL 108  CO2 21*  GLUCOSE 101*  BUN 15  CREATININE 1.29*  CALCIUM 8.8*   GFR: Estimated Creatinine Clearance: 60 mL/min (A) (by C-G formula based on SCr of 1.29 mg/dL (H)). Liver Function Tests: Recent Labs  Lab 09/17/19 1710  AST 69*  ALT 54*  ALKPHOS 60  BILITOT 1.5*  PROT 6.8  ALBUMIN 3.8   No results for input(s): LIPASE, AMYLASE in the last 168 hours. No results for input(s): AMMONIA in the last 168 hours. Coagulation Profile: Recent Labs  Lab 09/17/19 1710  INR 0.9   Cardiac Enzymes: No results for input(s): CKTOTAL, CKMB, CKMBINDEX, TROPONINI in the last 168 hours. BNP (last 3 results) No results for input(s): PROBNP in the last 8760 hours. HbA1C: Recent Labs    09/17/19 1701  HGBA1C 5.0   CBG: No results for input(s): GLUCAP in the last 168 hours. Lipid Profile: Recent Labs    09/17/19 1701  CHOL 174  HDL 99  LDLCALC 65  TRIG 52  CHOLHDL 1.8   Thyroid Function Tests: No results for input(s): TSH, T4TOTAL, FREET4, T3FREE, THYROIDAB in the last 72 hours. Anemia Panel: No results for input(s): VITAMINB12, FOLATE, FERRITIN, TIBC, IRON, RETICCTPCT in the last 72 hours. Urine analysis:    Component Value Date/Time   COLORURINE YELLOW 06/29/2012 1518   APPEARANCEUR HAZY  (A) 06/29/2012 1518   LABSPEC 1.023 06/29/2012 1518   PHURINE 6.0 06/29/2012 1518   GLUCOSEU NEGATIVE 06/29/2012 1518   HGBUR NEGATIVE 06/29/2012 1518   BILIRUBINUR SMALL (A) 06/29/2012 1518   KETONESUR 15 (A) 06/29/2012 1518   PROTEINUR NEGATIVE 06/29/2012 1518   UROBILINOGEN 0.2 06/29/2012 1518   NITRITE NEGATIVE 06/29/2012 1518   LEUKOCYTESUR SMALL (A) 06/29/2012 1518    Radiological Exams on Admission: DG Chest Port 1 View  Result Date: 09/17/2019 CLINICAL DATA:  Chest pain, STEMI EXAM: PORTABLE CHEST 1 VIEW COMPARISON:  03/19/2018 FINDINGS: Mild mid and lower lung zone interstitial pulmonary infiltrate is present most in keeping with interstitial pulmonary edema. There is  interval enlargement of the hila bilaterally likely representing central pulmonary arterial enlargement and suggestive of underlying pulmonary arterial hypertension. No pneumothorax or pleural effusion. Cardiac size is at the upper limits of normal. No acute bone abnormality. IMPRESSION: 1. Mild interstitial pulmonary edema. 2. Interval enlargement of the hila bilaterally likely representing central pulmonary arterial enlargement and suggestive of underlying pulmonary arterial hypertension. 3. Heart size at the upper limits of normal, likely related to low lung volumes. Electronically Signed   By: Fidela Salisbury MD   On: 09/17/2019 17:46    EKG: Independently reviewed.  Assessment/Plan Principal Problem:   Acute on chronic systolic CHF (congestive heart failure) (HCC) Active Problems:   Cocaine abuse ([place])   Current smoker   HTN (hypertension)   NICM (nonischemic cardiomyopathy) ([place])    1. Acute on chronic systolic CHF - 1. CHF pathway 2. Lasix ordered by cards 3. NTG gtt ordered by cards 4. Tele monitor 5. 2d echo ordered by cards 6. Strict intake and output 7. Daily BMP 2. HTN - 1. Cont home ARB 2. Cont home coreg 3. Cocaine abuse - 1. Pt advised to stop and that this is probably whats cause his  heart problems.  DVT prophylaxis: Lovenox Code Status: Full Family Communication: No family in room Disposition Plan: Home after CHF resolved Consults called: Cards has seen pt Admission status: Place in 24   Tiberius Loftus, [facility]  How to contact the [facility] Attending or Consulting provider [place] or covering provider during after hours [place], for this patient?  1. Check the care team in [facility] and look for a) attending/consulting [facility] provider listed and b) the [facility] team listed 2. Log into [email or web address]  Amion Physician Scheduling and messaging for groups and whole hospitals  On call and physician scheduling software for group practices, residents, hospitalists and other medical providers for call, clinic, rotation and shift schedules. OnCall [employer] is a hospital-wide system for scheduling doctors and paging doctors on call. EasyPlot is for scientific plotting and data analysis.  [email or web address]  and use [facility]'s universal password to access. If you do not have the password, please contact the hospital operator.  3. Locate the [facility] provider you are looking for under [facility] and page to a number that you can be directly reached. 4. If you still have difficulty reaching the provider, please page the [facility] (Director on Call) for the Hospitalists listed on amion for assistance.  09/17/2019, 10:00 PM

## 2019-09-17 NOTE — Consult Note (Addendum)
Cardiology Consultation:   Patient ID: Charles Cervantes MRN: [id number]; DOB: 08/04/1960  Admit date: 09/17/2019 Date of Consult: 09/17/2019  Primary Care Provider: [name], [facility] Cardiologist: Pixie Casino, MD  [facility] Electrophysiologist:  None   Patient Profile:   Charles Cervantes is a 59 y.o. male with a hx of systolic HF in 5701 in [place]. Due to [facility] with nomral Cors on cath.  EF 20-25%, polysubstance abuse but quit in 2014. Over time EF improved to normal.   Last seen in 2017 now presents with chest pain and SOB who is being seen today for the evaluation of chest pain at the request of Dr. Maryan Rued.  History of Present Illness:   [name] with above hx and in 2017 normalized EF.  No CAD on prior caths. last echo in 2016.  EF 55-60%.    Today pt presents with chest pain and SOB, EKG with LVH, concern for STEMI initially but not felt to be STEMI - his BP was elevated on arrival 163/118.  He is COVID neg.  [place] and they told him his heart was not working well.  The papers are at his house and he cannot get family to bring in.  He is on coreg, losartan and lasix though does not remember dose.  He continues to smoke and last used cocaine yesterday.  He is easily irritated today. He was to have seen Dr. Debara Cervantes but felt too bad. Then could not wait until Friday to be seen.  Very SOB and chest pain.   EKG:  The EKG was personally reviewed and demonstrates:  SB at 70 with ST elevation V2 and V3 and T wave inversion in V5-6  Telemetry:  Telemetry was personally reviewed and demonstrates:  SR  Na 140, k+ 5.4, Cr. 1.29, Ca+8.8 AST 69, ALT 54 BNP 570 Hs Troponin 18  T chol 174, HDL 99 LDL 65 WBC 3.8, Hgb 16.1, Plts 208  hgb A1c 5  PCXR  IMPRESSION: 1. Mild interstitial pulmonary edema. 2. Interval enlargement of the hila bilaterally likely representing central pulmonary arterial enlargement and suggestive of  underlying pulmonary arterial hypertension. 3. Heart size at the upper limits of normal, likely related to low lung volumes.   Pt has had lsix 60 and ASA 324 and now on IV ntg drip   Past Medical History:  Diagnosis Date  . CHF (congestive heart failure) (HCC)    takes Furosemide daily  . Chronic back pain   . Constipation    takes Colace daily  . COPD (chronic obstructive pulmonary disease) (HCC)    Spiriva daily  . Depression    takes Effexor daily and Risperdal nightly  . Dizziness   . H/O blood clots 2012   in leg  . Headache    occasionally  . Hyperlipidemia   . Hypertension    takes Losartan and Coreg daily  . Muscle spasm    takes Zizanidine daily as needed  . Shortness of breath dyspnea    more with exertion but sometimes notices with lying/sitting  . Sleep apnea   . Weakness    tingling and numbness    Past Surgical History:  Procedure Laterality Date  . LEFT AND RIGHT HEART CATHETERIZATION WITH CORONARY ANGIOGRAM N/A 11/20/2012   Procedure: LEFT AND RIGHT HEART CATHETERIZATION WITH CORONARY ANGIOGRAM;  Surgeon: Jolaine Artist, MD;  Location: [facility] CATH LAB;  Service: Cardiovascular;  Laterality:  N/A;  . Left Second finger surgery    . LUMBAR FUSION  09/05/2014   L5 S1     Home Medications:  Prior to Admission medications   Medication Sig Start Date End Date Taking? Authorizing Provider  albuterol (PROVENTIL HFA;VENTOLIN HFA) 108 (90 BASE) MCG/ACT inhaler Inhale 2 puffs into the lungs every 6 (six) hours as needed for wheezing or shortness of breath. 04/18/14   Vivi Barrack, MD  carvedilol (COREG) 6.25 MG tablet Take 1 tablet (6.25 mg total) by mouth 2 (two) times daily with a meal. Please make annual appt for future refills. [phone]. 1st attempt. 09/16/19   Hilty, Nadean Corwin, MD  cyclobenzaprine (FLEXERIL) 10 MG tablet Take 1 tablet (10 mg total) by mouth 2 (two) times daily as needed for muscle spasms. 04/24/18   Ward, Ozella Almond, PA-C    furosemide (LASIX) 40 MG tablet Take 1 tablet (40 mg total) by mouth daily. 08/26/19   Hilty, Nadean Corwin, MD  gabapentin (NEURONTIN) 300 MG capsule Take 1 capsule (300 mg total) by mouth 4 (four) times daily. 03/18/15   Kirsteins, Luanna Salk, MD  HYDROcodone-acetaminophen (NORCO) 10-325 MG tablet Take 1 tablet by mouth 3 (three) times daily. 10/07/14   Vivi Barrack, MD  losartan (COZAAR) 100 MG tablet Take 1 tablet (100 mg total) by mouth daily. 08/26/19   Hilty, Nadean Corwin, MD  methocarbamol (ROBAXIN) 500 MG tablet Take 1 tablet (500 mg total) by mouth 3 (three) times daily as needed for muscle spasms. 09/03/14   Melina Schools, MD  oxyCODONE-acetaminophen (PERCOCET) 10-325 MG per tablet Take 1 tablet by mouth every 4 (four) hours as needed for pain. 09/03/14   Melina Schools, MD  predniSONE (DELTASONE) 50 MG tablet Take 1 tablet (50 mg total) by mouth daily. 04/15/18   Lawyer, Harrell Gave, PA-C  risperiDONE (RISPERDAL) 3 MG tablet Take 1 tablet by mouth at bedtime. 04/21/14   [provider]  traMADol (ULTRAM) 50 MG tablet Take 1 tablet (50 mg total) by mouth every 6 (six) hours as needed for severe pain. 04/15/18   Dalia Heading, PA-C    Inpatient Medications: Scheduled Meds:  Continuous Infusions: . nitroGLYCERIN 10 mcg/min (09/17/19 1755)   PRN Meds:   Allergies:    Allergies  Allergen Reactions  . Ace Inhibitors Swelling    angioedema    Social History:   Social History   Socioeconomic History  . Marital status: Single    Spouse name: Not on file  . Number of children: 0  . Years of education: Not on file  . Highest education level: Not on file  Occupational History  . Occupation: unemployed  Tobacco Use  . Smoking status: Current Some Day Smoker    Packs/day: 0.25    Years: 20.00    Pack years: 5.00    Types: Cigarettes  . Smokeless tobacco: Never Used  . Tobacco comment: 1-2 cigs per day (08/15/14)  Vaping Use  . Vaping Use: Never used  Substance and  Sexual Activity  . Alcohol use: No    Alcohol/week: 0.0 standard drinks    Comment: stopped in april 2014  . Drug use: Not Currently    Types: Cocaine  . Sexual activity: Never  Other Topics Concern  . Not on file  Social History Narrative  . Not on file   Social Determinants of Health   Financial Resource Strain:   . Difficulty of Paying Living Expenses: Not on file  Food Insecurity:   . Worried  About Running Out of Food in the Last Year: Not on file  . Ran Out of Food in the Last Year: Not on file  Transportation Needs:   . Lack of Transportation (Medical): Not on file  . Lack of Transportation (Non-Medical): Not on file  Physical Activity:   . Days of Exercise per Week: Not on file  . Minutes of Exercise per Session: Not on file  Stress:   . Feeling of Stress : Not on file  Social Connections:   . Frequency of Communication with Friends and Family: Not on file  . Frequency of Social Gatherings with Friends and Family: Not on file  . Attends Religious Services: Not on file  . Active Member of Clubs or Organizations: Not on file  . Attends Archivist Meetings: Not on file  . Marital Status: Not on file  Intimate Partner Violence:   . Fear of Current or Ex-Partner: Not on file  . Emotionally Abused: Not on file  . Physically Abused: Not on file  . Sexually Abused: Not on file    Family History:   No family history on file. + HTN  ROS:  Please see the history of present illness.  General:no colds or fevers, no weight changes Skin:no rashes or ulcers HEENT:no blurred vision, no congestion CV:see HPI PUL:see HPI GI:no diarrhea constipation or melena, no indigestion GU:no hematuria, no dysuria MS:no joint pain, no claudication Neuro:no syncope, no lightheadedness Endo:no diabetes, no thyroid disease  All other ROS reviewed and negative.     Physical Exam/Data:   Vitals:   09/17/19 1715 09/17/19 1730 09/17/19 1745 09/17/19 1900  BP: (!) 175/122 (!)  176/125 (!) 163/118 (!) 169/114  Pulse: 65 64 (!) 59 (!) 57  Resp: (!) 22 (!) 23 (!) 24 (!) 23  SpO2: 100% 98% 100% 100%   No intake or output data in the 24 hours ending 09/17/19 1936 Last 3 Weights 04/24/2018 04/15/2018 03/19/2018  Weight (lbs) 170 lb 160 lb 160 lb  Weight (kg) 77.111 kg 72.576 kg 72.576 kg     There is no height or weight on file to calculate BMI.  General:  Thin male in no acute distress, resting better than on arrival  HEENT: normal Lymph: no adenopathy Neck: + JVD Endocrine:  No thryomegaly Vascular: No carotid bruits; pedal pulses 2+ bilaterally  Cardiac:  normal S1, S2; RRR; no murmur gallup rub or click Lungs:  Rales and wheezes to auscultation bilaterally, no rhonchi  Abd: soft, nontender, no hepatomegaly  Ext: no edema Musculoskeletal:  No deformities, BUE and BLE strength normal and equal Skin: warm and dry  Neuro:  Alert and oriented X 3, MAE follows commands, no focal abnormalities noted Psych:  Normal affect    Relevant CV Studies: Echo 07/02/14 Study Conclusions   - Left ventricle: The cavity size was normal. There was mild  concentric hypertrophy. Systolic function was normal. The  estimated ejection fraction was in the range of 55% to 60%. Wall  motion was normal; there were no regional wall motion  abnormalities. Doppler parameters are consistent with abnormal  left ventricular relaxation (grade 1 diastolic dysfunction).  - Left atrium: The atrium was mildly dilated.    Rt and LHC 2014 RHC/LHC  11/20/12  RA = 6  RV = 33/3/8  PA = 29/9 (18)  PCW = 8  Fick cardiac output/index = 6.4/3.2  PVR = 1.5 WU  FA sat = 95%  PA sat = 72%, 73%  SVC  sat 72% Extensive calcification in proximal and mid LAD with only mild intraluminal stenosis with 20-30% stenosis.   Laboratory Data:  High Sensitivity Troponin:   Recent Labs  Lab 09/17/19 1710  TROPONINIHS 18*     Chemistry Recent Labs  Lab 09/17/19 1710  NA 140  K 5.4*  CL  108  CO2 21*  GLUCOSE 101*  BUN 15  CREATININE 1.29*  CALCIUM 8.8*  GFRNONAA >60  GFRAA >60  ANIONGAP 11    Recent Labs  Lab 09/17/19 1710  PROT 6.8  ALBUMIN 3.8  AST 69*  ALT 54*  ALKPHOS 60  BILITOT 1.5*   Hematology Recent Labs  Lab 09/17/19 1710  WBC 3.8*  RBC 5.09  HGB 16.1  HCT 49.9  MCV 98.0  MCH 31.6  MCHC 32.3  RDW 13.1  PLT 208   BNPNo results for input(s): BNP, PROBNP in the last 168 hours.  DDimer No results for input(s): DDIMER in the last 168 hours.   Radiology/Studies:  DG Chest Port 1 View  Result Date: 09/17/2019 CLINICAL DATA:  Chest pain, STEMI EXAM: PORTABLE CHEST 1 VIEW COMPARISON:  03/19/2018 FINDINGS: Mild mid and lower lung zone interstitial pulmonary infiltrate is present most in keeping with interstitial pulmonary edema. There is interval enlargement of the hila bilaterally likely representing central pulmonary arterial enlargement and suggestive of underlying pulmonary arterial hypertension. No pneumothorax or pleural effusion. Cardiac size is at the upper limits of normal. No acute bone abnormality. IMPRESSION: 1. Mild interstitial pulmonary edema. 2. Interval enlargement of the hila bilaterally likely representing central pulmonary arterial enlargement and suggestive of underlying pulmonary arterial hypertension. 3. Heart size at the upper limits of normal, likely related to low lung volumes. Electronically Signed   By: Fidela Salisbury MD   On: 09/17/2019 17:46       HEAR Score (for undifferentiated chest pain):     5  New York Heart Association (NYHA) Functional Class NYHA Class III  Assessment and Plan:   1. Chest pain in combination with uncontrolled HTN - EKG --LVH troponin 18 prior cath with normal coronary arteries in 2014.  2. Acute CHF may be due to HTN, check echo has had 60 of IV lasix.  Continue will give dose now and continue IV NTG.   3. Uncontrolled HTN. On losartan at home resume and continue IV ntg for HF and  4. Hx of  sleep apnea needs CPAP + tobacco use 5. +cocaine use    For questions or updates, please contact [place] Please consult [email or web address] for contact info under   Signed, Cecilie Kicks, NP  09/17/2019 7:36 PM   The patient was seen, examined and discussed with Cecilie Kicks, NP and I agree with the above.   FAYETTE HAMADA is a 59 y.o. male with a hx of polysubstance abuse including cocaine, tobacco, THC), systolic HF in 9371, Non-ischemic CMP (normal cath in 2014 in [place]), EF 20-25% with normalization to 55-60% on echo in 2016. The patient presented with progressively worsening SOB, now at rest with orthopnea, PND and non-exertional chest pain, no palpitations, no fever or cough, no LE edema. EKG with significant LVH with strain pattern, concern for STEMI initially but not felt to be STEMI - his BP was elevated on arrival 163/118.  He is COVID neg.  He admits to ongoing smoking and regular cocaine use - last time few days ago. Currently chest pain free.  Physical exam reveals: +6 JVDs B/L, I9,6, 2/6 systolic murmur, no  S4, crackles at bases B/L and end-exspiratory wheezing.   Assessment and plan:  Acute on chronic CHF (unknown recent CHF) Non-ischemic cardiomyopathy Hypertensive urgency, significant LVH on ECG COPD, ongoing tobacco use Cocaine use Medication non-compliance  We will start Lasix 60 mg iv BID, reevaluate Crea and K in the am, he needs better BP control, continue home dose of carvedilol 6.25 mg po BID, no room for uptitration as HR in 50-60', switch losartan to more potent irbesartan 150 mg po daily, uptitrate as tolerated. Wean off NTG drip. Start breathing treatments with atrovent/albuterol, consider Dulera on discharge (coverage might be a problem), smoking cessation and abstinence from cocaine were discussed.  Obtain repeat echocardiogram.  Ena Dawley, MD 09/17/2019

## 2019-09-17 NOTE — ED Notes (Signed)
Code STEMI canceled per cardiology.

## 2019-09-17 NOTE — ED Provider Notes (Signed)
[facility] EMERGENCY DEPARTMENT Provider Note   CSN: [id number] Arrival date & time: 09/17/19  1619     History Chief Complaint  Patient presents with  . Code STEMI    Charles Cervantes is a 59 y.o. male.  Patient is a 59 year old male with a history of COPD, ongoing tobacco use, CHF on furosemide daily, hypertension, hyperlipidemia, remote history of DVT who was recently hospitalized several weeks ago in his hometown and kept for 3 days due to fluid overload presenting today with complaint of shortness of breath and chest pain. Patient reports after he was discharged from the hospital he has been continuing to take his diuretic medication and had an appointment with Dr. Debara Pickett this morning but was unable to keep the appointment. For the last several days he has had worsening shortness of breath with exertion and laying flat. He does not check his weight regularly and has not noted significant swelling. He continues to take his Lasix daily and tries to follow a low-sodium diet. However today about 2:00 he started having an intense pressure in the center of his chest that is 8 out of 10 and has been persistent. He denies any nausea or vomiting. He has had a dry cough but denies any fever or abdominal pain. He denies any recent drug use.  The history is provided by the patient.       Past Medical History:  Diagnosis Date  . CHF (congestive heart failure) (HCC)    takes Furosemide daily  . Chronic back pain   . Constipation    takes Colace daily  . COPD (chronic obstructive pulmonary disease) (HCC)    Spiriva daily  . Depression    takes Effexor daily and Risperdal nightly  . Dizziness   . H/O blood clots 2012   in leg  . Headache    occasionally  . Hyperlipidemia   . Hypertension    takes Losartan and Coreg daily  . Muscle spasm    takes Zizanidine daily as needed  . Shortness of breath dyspnea    more with exertion but sometimes notices with lying/sitting    . Sleep apnea   . Weakness    tingling and numbness    Patient Active Problem List   Diagnosis Date Noted  . Frequent No-show for appointment 02/08/2016  . NICM (nonischemic cardiomyopathy) ([place]) 12/04/2015  . Back pain 09/03/2014  . Spinal stenosis in cervical region 04/21/2014  . Cervical disc disorder with radiculopathy of cervical region 04/21/2014  . COPD (chronic obstructive pulmonary disease) ([place]) 04/18/2014  . Right knee buckling 04/18/2014  . Chronic low back pain 03/17/2014  . Myofascial pain syndrome 01/20/2014  . Anxiety and depression 11/04/2013  . PTSD (post-traumatic stress disorder) 11/04/2013  . Neuropathic pain 09/12/2013  . Healthcare maintenance 09/12/2013  . OSA (obstructive sleep apnea) 03/26/2013  . HTN (hypertension) 01/01/2013  . Chest pain 11/16/2012  . Current smoker 09/05/2012  . Chronic systolic heart failure ([place]) 05/08/2012  . Polysubstance abuse ([place]) 05/08/2012  . Alcohol abuse 05/01/2012  . Cocaine abuse ([place]) 05/01/2012    Past Surgical History:  Procedure Laterality Date  . LEFT AND RIGHT HEART CATHETERIZATION WITH CORONARY ANGIOGRAM N/A 11/20/2012   Procedure: LEFT AND RIGHT HEART CATHETERIZATION WITH CORONARY ANGIOGRAM;  Surgeon: Jolaine Artist, MD;  Location: [facility] CATH LAB;  Service: Cardiovascular;  Laterality: N/A;  . Left Second finger surgery    . LUMBAR FUSION  09/05/2014   L5 S1  No family history on file.  Social History   Tobacco Use  . Smoking status: Current Some Day Smoker    Packs/day: 0.25    Years: 20.00    Pack years: 5.00    Types: Cigarettes  . Smokeless tobacco: Never Used  . Tobacco comment: 1-2 cigs per day (08/15/14)  Vaping Use  . Vaping Use: Never used  Substance Use Topics  . Alcohol use: No    Alcohol/week: 0.0 standard drinks    Comment: stopped in april 2014  . Drug use: Not Currently    Types: Cocaine    Home Medications Prior to Admission medications   Medication Sig Start  Date End Date Taking? Authorizing Provider  albuterol (PROVENTIL HFA;VENTOLIN HFA) 108 (90 BASE) MCG/ACT inhaler Inhale 2 puffs into the lungs every 6 (six) hours as needed for wheezing or shortness of breath. 04/18/14   Vivi Barrack, MD  carvedilol (COREG) 6.25 MG tablet Take 1 tablet (6.25 mg total) by mouth 2 (two) times daily with a meal. Please make annual appt for future refills. [phone]. 1st attempt. 09/16/19   Hilty, Nadean Corwin, MD  cyclobenzaprine (FLEXERIL) 10 MG tablet Take 1 tablet (10 mg total) by mouth 2 (two) times daily as needed for muscle spasms. 04/24/18   Ward, Ozella Almond, PA-C  furosemide (LASIX) 40 MG tablet Take 1 tablet (40 mg total) by mouth daily. 08/26/19   Hilty, Nadean Corwin, MD  gabapentin (NEURONTIN) 300 MG capsule Take 1 capsule (300 mg total) by mouth 4 (four) times daily. 03/18/15   Kirsteins, Luanna Salk, MD  HYDROcodone-acetaminophen (NORCO) 10-325 MG tablet Take 1 tablet by mouth 3 (three) times daily. 10/07/14   Vivi Barrack, MD  losartan (COZAAR) 100 MG tablet Take 1 tablet (100 mg total) by mouth daily. 08/26/19   Hilty, Nadean Corwin, MD  methocarbamol (ROBAXIN) 500 MG tablet Take 1 tablet (500 mg total) by mouth 3 (three) times daily as needed for muscle spasms. 09/03/14   Melina Schools, MD  oxyCODONE-acetaminophen (PERCOCET) 10-325 MG per tablet Take 1 tablet by mouth every 4 (four) hours as needed for pain. 09/03/14   Melina Schools, MD  predniSONE (DELTASONE) 50 MG tablet Take 1 tablet (50 mg total) by mouth daily. 04/15/18   Lawyer, Harrell Gave, PA-C  risperiDONE (RISPERDAL) 3 MG tablet Take 1 tablet by mouth at bedtime. 04/21/14   [provider]  traMADol (ULTRAM) 50 MG tablet Take 1 tablet (50 mg total) by mouth every 6 (six) hours as needed for severe pain. 04/15/18   Lawyer, Harrell Gave, PA-C    Allergies    Ace inhibitors  Review of Systems   Review of Systems  All other systems reviewed and are negative.   Physical Exam Updated Vital  Signs BP (!) 163/118   Pulse (!) 59   Resp (!) 24   SpO2 100%   Physical Exam Vitals and nursing note reviewed.  Constitutional:      General: He is not in acute distress.    Appearance: He is well-developed.  HENT:     Head: Normocephalic and atraumatic.  Eyes:     Conjunctiva/sclera: Conjunctivae normal.     Pupils: Pupils are equal, round, and reactive to light.  Cardiovascular:     Rate and Rhythm: Normal rate and regular rhythm.     Heart sounds: No murmur heard.   Pulmonary:     Effort: Pulmonary effort is normal. Tachypnea present. No respiratory distress.     Breath sounds: Normal breath  sounds. No wheezing or rales.  Chest:     Chest wall: No tenderness.  Abdominal:     General: There is no distension.     Palpations: Abdomen is soft.     Tenderness: There is no abdominal tenderness. There is no guarding or rebound.  Musculoskeletal:        General: No tenderness. Normal range of motion.     Cervical back: Normal range of motion and neck supple.     Right lower leg: No edema.     Left lower leg: No edema.  Skin:    General: Skin is warm and dry.     Findings: No erythema or rash.  Neurological:     General: No focal deficit present.     Mental Status: He is alert and oriented to person, place, and time. Mental status is at baseline.  Psychiatric:        Mood and Affect: Mood normal.        Behavior: Behavior normal.        Thought Content: Thought content normal.     ED Results / Procedures / Treatments   Labs (all labs ordered are listed, but only abnormal results are displayed) Labs Reviewed  CBC WITH DIFFERENTIAL/PLATELET - Abnormal; Notable for the following components:      Result Value   WBC 3.8 (*)    All other components within normal limits  APTT - Abnormal; Notable for the following components:   aPTT 22 (*)    All other components within normal limits  COMPREHENSIVE METABOLIC PANEL - Abnormal; Notable for the following components:    Potassium 5.4 (*)    CO2 21 (*)    Glucose, Bld 101 (*)    Creatinine, Ser 1.29 (*)    Calcium 8.8 (*)    AST 69 (*)    ALT 54 (*)    Total Bilirubin 1.5 (*)    All other components within normal limits  TROPONIN I (HIGH SENSITIVITY) - Abnormal; Notable for the following components:   Troponin I (High Sensitivity) 18 (*)    All other components within normal limits  SARS CORONAVIRUS 2 BY RT PCR (HOSPITAL ORDER, [facility])  HEMOGLOBIN A1C  PROTIME-INR  LIPID PANEL  BRAIN NATRIURETIC PEPTIDE  TROPONIN I (HIGH SENSITIVITY)    EKG EKG Interpretation  Date/Time:  Tuesday September 17 2019 16:53:13 EDT Ventricular Rate:  58 PR Interval:  128 QRS Duration: 94 QT Interval:  414 QTC Calculation: 406 R Axis:   49 Text Interpretation: Sinus bradycardia Left ventricular hypertrophy with repolarization abnormality ( Sokolow-Lyon , Cornell product , Romhilt-Estes ) Nonspecific ST abnormality new ST elevation in V2 and V3 compared to prior tracing Confirmed by Blanchie Dessert [id number]) on 09/17/2019 4:58:43 PM   DG Chest Port 1 View  Result Date: 09/17/2019 CLINICAL DATA:  Chest pain, STEMI EXAM: PORTABLE CHEST 1 VIEW COMPARISON:  03/19/2018 FINDINGS: Mild mid and lower lung zone interstitial pulmonary infiltrate is present most in keeping with interstitial pulmonary edema. There is interval enlargement of the hila bilaterally likely representing central pulmonary arterial enlargement and suggestive of underlying pulmonary arterial hypertension. No pneumothorax or pleural effusion. Cardiac size is at the upper limits of normal. No acute bone abnormality. IMPRESSION: 1. Mild interstitial pulmonary edema. 2. Interval enlargement of the hila bilaterally likely representing central pulmonary arterial enlargement and suggestive of underlying pulmonary arterial hypertension. 3. Heart size at the upper limits of normal, likely related to low lung  volumes. Electronically Signed    By: Fidela Salisbury MD   On: 09/17/2019 17:46    Procedures Procedures (including critical care time)  Medications Ordered in ED Medications  aspirin chewable tablet 324 mg (has no administration in time range)  nitroGLYCERIN 50 mg in dextrose 5 % 250 mL (0.2 mg/mL) infusion (has no administration in time range)    ED Course  I have reviewed the triage vital signs and the nursing notes.  Pertinent labs & imaging results that were available during my care of the patient were reviewed by me and considered in my medical decision making (see chart for details).    MDM Rules/Calculators/A&P                          59 year old male presenting today with shortness of breath and chest pain. Patient with a heart history as well as COPD. He is not wheezing on exam and satting 100% on room air. However patient is hypertensive. Initial EKG showed concerning ST elevation in V3 compared to a prior EKG 1 year ago. However second EKG was more consistent with LVH. Patient has had worsening symptoms concerning for CHF with exertional dyspnea and orthopnea. He is taking his Lasix daily but reports the pain started today about 2:00. Concern for hypertensive urgency. Spoke with Dr. Tamala Julian with cardiology and he did not feel that it was a STEMI at this time and it was canceled. Patient placed on a nitro drip to help with blood pressure. He was given 325 of aspirin. Labs and imaging are pending. The pain does not radiate he has equal pulses in all 4 extremities and low suspicion for PE and dissection.  6:26 PM On repeat evaluation patient has improved and chest pain is now he reports almost gone.  He is currently on a nitro drip of 15 with improvement in his blood pressure to 163/118.  Initially patient's blood pressure was systolic greater than 124/580.  Patient's initial troponin is 18, mild elevation of LFTs and creatinine of 1.2.  Persistent leukopenia with white count of 3.8.  Covid is pending.  Suspect  cardiorenal syndrome and CHF.  Chest x-ray with evidence of fluid overload.  Also concern for component of pulmonary artery hypertension.  Patient on reevaluation still has no wheezing and low suspicion that his COPD exacerbation.  Will discuss with cardiology and admit for further care.  MDM Number of Diagnoses or Management Options   Amount and/or Complexity of Data Reviewed Clinical lab tests: ordered and reviewed Tests in the radiology section of CPT: ordered and reviewed Tests in the medicine section of CPT: ordered and reviewed Decide to obtain previous medical records or to obtain history from someone other than the patient: yes Obtain history from someone other than the patient: yes Review and summarize past medical records: yes Discuss the patient with other providers: yes Independent visualization of images, tracings, or specimens: yes  Risk of Complications, Morbidity, and/or Mortality Presenting problems: high Diagnostic procedures: moderate Management options: moderate  Patient Progress Patient progress: improved  CRITICAL CARE Performed by: Merdith Boyd Total critical care time: 40 minutes Critical care time was exclusive of separately billable procedures and treating other patients. Critical care was necessary to treat or prevent imminent or life-threatening deterioration. Critical care was time spent personally by me on the following activities: development of treatment plan with patient and/or surrogate as well as nursing, discussions with consultants, evaluation of patient's response to treatment, examination  of patient, obtaining history from patient or surrogate, ordering and performing treatments and interventions, ordering and review of laboratory studies, ordering and review of radiographic studies, pulse oximetry and re-evaluation of patient's condition.   Final Clinical Impression(s) / ED Diagnoses Final diagnoses:  Acute on chronic congestive heart  failure, unspecified heart failure type [facility])  Hypertensive urgency    Rx / DC Orders ED Discharge Orders    None       Blanchie Dessert, MD 09/17/19 1931

## 2019-09-18 ENCOUNTER — Observation Stay (HOSPITAL_COMMUNITY): Payer: Medicaid Other

## 2019-09-18 DIAGNOSIS — G4733 Obstructive sleep apnea (adult) (pediatric): Secondary | ICD-10-CM | POA: Diagnosis present

## 2019-09-18 DIAGNOSIS — I428 Other cardiomyopathies: Secondary | ICD-10-CM | POA: Diagnosis present

## 2019-09-18 DIAGNOSIS — E876 Hypokalemia: Secondary | ICD-10-CM | POA: Diagnosis present

## 2019-09-18 DIAGNOSIS — I5021 Acute systolic (congestive) heart failure: Secondary | ICD-10-CM | POA: Diagnosis not present

## 2019-09-18 DIAGNOSIS — I5023 Acute on chronic systolic (congestive) heart failure: Secondary | ICD-10-CM

## 2019-09-18 DIAGNOSIS — E440 Moderate protein-calorie malnutrition: Secondary | ICD-10-CM | POA: Diagnosis present

## 2019-09-18 DIAGNOSIS — T405X1A Poisoning by cocaine, accidental (unintentional), initial encounter: Secondary | ICD-10-CM | POA: Diagnosis not present

## 2019-09-18 DIAGNOSIS — I10 Essential (primary) hypertension: Secondary | ICD-10-CM | POA: Diagnosis not present

## 2019-09-18 DIAGNOSIS — N179 Acute kidney failure, unspecified: Secondary | ICD-10-CM | POA: Diagnosis present

## 2019-09-18 DIAGNOSIS — Z20822 Contact with and (suspected) exposure to covid-19: Secondary | ICD-10-CM | POA: Diagnosis present

## 2019-09-18 DIAGNOSIS — F17210 Nicotine dependence, cigarettes, uncomplicated: Secondary | ICD-10-CM | POA: Diagnosis present

## 2019-09-18 DIAGNOSIS — I110 Hypertensive heart disease with heart failure: Secondary | ICD-10-CM | POA: Diagnosis present

## 2019-09-18 DIAGNOSIS — Z981 Arthrodesis status: Secondary | ICD-10-CM | POA: Diagnosis not present

## 2019-09-18 DIAGNOSIS — I509 Heart failure, unspecified: Secondary | ICD-10-CM

## 2019-09-18 DIAGNOSIS — Z681 Body mass index (BMI) 19 or less, adult: Secondary | ICD-10-CM | POA: Diagnosis not present

## 2019-09-18 DIAGNOSIS — I160 Hypertensive urgency: Secondary | ICD-10-CM | POA: Diagnosis present

## 2019-09-18 DIAGNOSIS — F1410 Cocaine abuse, uncomplicated: Secondary | ICD-10-CM | POA: Diagnosis present

## 2019-09-18 LAB — BASIC METABOLIC PANEL
Anion gap: 10 (ref 5–15)
Anion gap: 9 (ref 5–15)
BUN: 12 mg/dL (ref 6–20)
BUN: 14 mg/dL (ref 6–20)
CO2: 18 mmol/L — ABNORMAL LOW (ref 22–32)
CO2: 22 mmol/L (ref 22–32)
Calcium: 7.3 mg/dL — ABNORMAL LOW (ref 8.9–10.3)
Calcium: 8.5 mg/dL — ABNORMAL LOW (ref 8.9–10.3)
Chloride: 109 mmol/L (ref 98–111)
Chloride: 101 mmol/L (ref 98–111)
Creatinine, Ser: 1.05 mg/dL (ref 0.61–1.24)
Creatinine, Ser: 1.57 mg/dL — ABNORMAL HIGH (ref 0.61–1.24)
GFR calc Af Amer: >60 mL/min (ref >60)
GFR calc Af Amer: 55 mL/min — ABNORMAL LOW (ref >60)
GFR calc non Af Amer: >60 mL/min (ref >60)
GFR calc non Af Amer: 48 mL/min — ABNORMAL LOW (ref >60)
Glucose, Bld: 112 mg/dL — ABNORMAL HIGH (ref 70–99)
Glucose, Bld: 94 mg/dL (ref 70–99)
Potassium: 2.9 mmol/L — ABNORMAL LOW (ref 3.5–5.1)
Potassium: 4.1 mmol/L (ref 3.5–5.1)
Sodium: 137 mmol/L (ref 135–145)
Sodium: 132 mmol/L — ABNORMAL LOW (ref 135–145)

## 2019-09-18 LAB — ECHOCARDIOGRAM COMPLETE
Area-P 1/2: 1.89 cm2
Height: 73 in
S' Lateral: 5.3 cm
Weight: 2400 oz

## 2019-09-18 LAB — MAGNESIUM: Magnesium: 1.8 mg/dL (ref 1.7–2.4)

## 2019-09-18 LAB — HIV ANTIBODY (ROUTINE TESTING W REFLEX): HIV Screen 4th Generation wRfx: NONREACTIVE

## 2019-09-18 LAB — SEDIMENTATION RATE: Sed Rate: 1 mm/hr (ref 0–16)

## 2019-09-18 MED ORDER — POTASSIUM CHLORIDE CRYS ER 20 MEQ PO TBCR
40.0000 meq | EXTENDED_RELEASE_TABLET | ORAL | Status: AC
  Administered 2019-09-18 (×2): 40 meq via ORAL
  Filled 2019-09-18 (×2): qty 2

## 2019-09-18 MED ORDER — ISOSORBIDE MONONITRATE ER 30 MG PO TB24
30.0000 mg | ORAL_TABLET | Freq: Every day | ORAL | Status: DC
  Administered 2019-09-18 – 2019-09-19 (×2): 30 mg via ORAL
  Filled 2019-09-18 (×2): qty 1

## 2019-09-18 MED ORDER — NICOTINE 21 MG/24HR TD PT24
21.0000 mg | MEDICATED_PATCH | Freq: Every day | TRANSDERMAL | Status: DC
  Administered 2019-09-18 – 2019-09-20 (×3): 21 mg via TRANSDERMAL
  Filled 2019-09-18 (×3): qty 1

## 2019-09-18 NOTE — Progress Notes (Signed)
[facility]                                                                              Patient Demographics  Charles Cervantes, is a 59 y.o. male, DOB - [birth year], KNL:[id number]  Admit date - 09/17/2019   Admitting Physician Etta Quill, DO  Outpatient Primary MD for the patient is [name], MD  Outpatient specialists:   LOS - 0  days   Medical records reviewed and are as summarized below:    Chief Complaint  Patient presents with  . Code STEMI       Brief summary   Patient is a 59 year old male with history of systolic congestive heart failure, EF 20 to 25% in 2014, improving over time, ongoing cocaine abuse, last use the night before the admission, smoking.  Presented with chest pain and shortness of breath.  Initially presented as possible STEMI however not felt to be STEMI by cardiology. COVID-19 negative  Assessment & Plan    Principal Problem:   Acute on chronic systolic CHF (congestive heart failure) (HCC) -In the setting of uncontrolled hypertension, cocaine use, OSA, tobacco use.  Normal cath in 2014 in [place], EF normalized to 55 to 60% on echo in 2016.  EKG showed LVH with strain pattern.  BP on arrival 163/118 -Follow 2D echo, cardiology consulted, -Currently  IV Lasix diuresis, on nitro drip, strict I's and O's and daily weights  Active Problems: Chest pain in the setting of cocaine use and uncontrolled hypertension, history of noncompliance, history of nonischemic cardiomyopathy -States chest pain is improving, wean off nitro drip as tolerated -Cardiology following, appreciate recommendations -Follow ESR, CRP, 2D echo    Cocaine abuse ([place]) -Counseled the patient on cocaine cessation    Current smoker -Place nicotine patch  Severe hypokalemia Potassium 2.9 -Check magnesium, potassium replaced  Moderate protein calorie malnutrition, underweight Estimated body mass index is 19.79 kg/m as calculated from the following:    Height as of this encounter: 6' 1"  (1.854 m).   Weight as of this encounter: 68 kg.  Code Status: Full CODE STATUS DVT Prophylaxis:  Lovenox  Family Communication: Discussed all imaging results, lab results, explained to the patient   Disposition Plan:     Status is: Inpatient  Remains inpatient appropriate because:Inpatient level of care appropriate due to severity of illness   Dispo: The patient is from: Home              Anticipated d/c is to: Home              Anticipated d/c date is: 3 days              Patient currently is not medically stable to d/c.      Time Spent in minutes   35 minutes  Procedures:  None  Consultants:   Cardiology  Antimicrobials:   Anti-infectives (From admission, onward)   None          Medications  Scheduled Meds: . carvedilol  6.25 mg Oral BID WC  . enoxaparin (LOVENOX) injection  40 mg Subcutaneous QHS  . furosemide  60 mg Intravenous  Q12H  . irbesartan  150 mg Oral Daily  . isosorbide mononitrate  30 mg Oral Daily  . potassium chloride  40 mEq Oral Q2H  . sodium chloride flush  3 mL Intravenous Q12H   Continuous Infusions: . sodium chloride     PRN Meds:.sodium chloride, acetaminophen, albuterol, ondansetron (ZOFRAN) IV, sodium chloride flush      Subjective:   Charles Cervantes was seen and examined today.  States chest pain + easing up, no acute shortness of breath.  No nausea or vomiting.  On nitroglycerin drip.  Patient denies dizziness, abdominal pain, N/V/D/C, new weakness, numbess, tingling. No acute events overnight.    Objective:   Vitals:   09/18/19 0300 09/18/19 0330 09/18/19 0400 09/18/19 0506  BP: (!) 137/97 (!) 127/91 (!) 144/97   Pulse: 65 65 72 73  Resp:    18  Temp:      TempSrc:      SpO2: 99% 97% 100% 99%  Weight:      Height:        Intake/Output Summary (Last 24 hours) at 09/18/2019 1054 Last data filed at 09/17/2019 2029 Gross per 24 hour  Intake --  Output 1325 ml  Net -1325 ml      Wt Readings from Last 3 Encounters:  09/17/19 68 kg  04/24/18 77.1 kg  04/15/18 72.6 kg     Exam  General: Alert and oriented x 3, NAD  Cardiovascular: S1 S2 auscultated, no murmurs, RRR  Respiratory: Decreased breath sound at the bases  Gastrointestinal: Soft, nontender, nondistended, + bowel sounds  Ext: no pedal edema bilaterally  Neuro: no new deficits   Musculoskeletal: No digital cyanosis, clubbing  Skin: No rashes  Psych: Normal affect and demeanor, alert and oriented x3    Data Reviewed:  I have personally reviewed following labs and imaging studies  Micro Results Recent Results (from the past 240 hour(s))  SARS Coronavirus 2 by RT PCR (hospital order, performed in [place] hospital lab) Nasopharyngeal Nasopharyngeal Swab     Status: None   Collection Time: 09/17/19  5:26 PM   Specimen: Nasopharyngeal Swab  Result Value Ref Range Status   SARS Coronavirus 2 NEGATIVE NEGATIVE Final    Comment: (NOTE) SARS-CoV-2 target nucleic acids are NOT DETECTED.  The SARS-CoV-2 RNA is generally detectable in upper and lower respiratory specimens during the acute phase of infection. The lowest concentration of SARS-CoV-2 viral copies this assay can detect is 250 copies / mL. A negative result does not preclude SARS-CoV-2 infection and should not be used as the sole basis for treatment or other patient management decisions.  A negative result may occur with improper specimen collection / handling, submission of specimen other than nasopharyngeal swab, presence of viral mutation(s) within the areas targeted by this assay, and inadequate number of viral copies (<250 copies / mL). A negative result must be combined with clinical observations, patient history, and epidemiological information.  Fact Sheet for Patients:   StrictlyIdeas.no  Fact Sheet for Healthcare Providers: BankingDealers.co.za  This test is not  yet approved or  cleared by the [place] FDA and has been authorized for detection and/or diagnosis of SARS-CoV-2 by FDA under an Emergency Use Authorization (EUA).  This EUA will remain in effect (meaning this test can be used) for the duration of the COVID-19 declaration under Section 564(b)(1) of the Act, 21 U.S.C. section 360bbb-3(b)(1), unless the authorization is terminated or revoked sooner.  Performed at [facility], [place]  [address] [place], [place] [address]     Radiology Reports DG Chest [place] 1 View  Result Date: 09/17/2019 CLINICAL DATA:  Chest pain, STEMI EXAM: PORTABLE CHEST 1 VIEW COMPARISON:  03/19/2018 FINDINGS: Mild mid and lower lung zone interstitial pulmonary infiltrate is present most in keeping with interstitial pulmonary edema. There is interval enlargement of the hila bilaterally likely representing central pulmonary arterial enlargement and suggestive of underlying pulmonary arterial hypertension. No pneumothorax or pleural effusion. Cardiac size is at the upper limits of normal. No acute bone abnormality. IMPRESSION: 1. Mild interstitial pulmonary edema. 2. Interval enlargement of the hila bilaterally likely representing central pulmonary arterial enlargement and suggestive of underlying pulmonary arterial hypertension. 3. Heart size at the upper limits of normal, likely related to low lung volumes. Electronically Signed   By: Fidela Salisbury MD   On: 09/17/2019 17:46    Lab Data:  CBC: Recent Labs  Lab 09/17/19 1710  WBC 3.8*  NEUTROABS 1.8  HGB 16.1  HCT 49.9  MCV 98.0  PLT 793   Basic Metabolic Panel: Recent Labs  Lab 09/17/19 1710 09/18/19 0500  NA 140 137  K 5.4* 2.9*  CL 108 109  CO2 21* 18*  GLUCOSE 101* 112*  BUN 15 12  CREATININE 1.29* 1.05  CALCIUM 8.8* 7.3*   GFR: Estimated Creatinine Clearance: 73.8 mL/min (by C-G formula based on SCr of 1.05 mg/dL). Liver Function Tests: Recent Labs  Lab 09/17/19 1710  AST 69*  ALT  54*  ALKPHOS 60  BILITOT 1.5*  PROT 6.8  ALBUMIN 3.8   No results for input(s): LIPASE, AMYLASE in the last 168 hours. No results for input(s): AMMONIA in the last 168 hours. Coagulation Profile: Recent Labs  Lab 09/17/19 1710  INR 0.9   Cardiac Enzymes: No results for input(s): CKTOTAL, CKMB, CKMBINDEX, TROPONINI in the last 168 hours. BNP (last 3 results) No results for input(s): PROBNP in the last 8760 hours. HbA1C: Recent Labs    09/17/19 1701  HGBA1C 5.0   CBG: No results for input(s): GLUCAP in the last 168 hours. Lipid Profile: Recent Labs    09/17/19 1701  CHOL 174  HDL 99  LDLCALC 65  TRIG 52  CHOLHDL 1.8   Thyroid Function Tests: No results for input(s): TSH, T4TOTAL, FREET4, T3FREE, THYROIDAB in the last 72 hours. Anemia Panel: No results for input(s): VITAMINB12, FOLATE, FERRITIN, TIBC, IRON, RETICCTPCT in the last 72 hours. Urine analysis:    Component Value Date/Time   COLORURINE YELLOW 06/29/2012 1518   APPEARANCEUR HAZY (A) 06/29/2012 1518   LABSPEC 1.023 06/29/2012 1518   PHURINE 6.0 06/29/2012 1518   GLUCOSEU NEGATIVE 06/29/2012 1518   HGBUR NEGATIVE 06/29/2012 1518   BILIRUBINUR SMALL (A) 06/29/2012 1518   KETONESUR 15 (A) 06/29/2012 1518   PROTEINUR NEGATIVE 06/29/2012 1518   UROBILINOGEN 0.2 06/29/2012 1518   NITRITE NEGATIVE 06/29/2012 1518   LEUKOCYTESUR SMALL (A) 06/29/2012 1518     Charles Cervantes M.D. [facility] 09/18/2019, 10:54 AM   Call night coverage person covering after 7pm

## 2019-09-18 NOTE — Progress Notes (Addendum)
Progress Note  Patient Name: Charles Cervantes Date of Encounter: 09/18/2019  Primary Cardiologist:  Pixie Casino, MD 2017  Subjective   Still w/ CP, worse w/ lying back, deep inspiration.   Inpatient Medications    Scheduled Meds:  carvedilol  6.25 mg Oral BID WC   enoxaparin (LOVENOX) injection  40 mg Subcutaneous QHS   furosemide  60 mg Intravenous Q12H   irbesartan  150 mg Oral Daily   sodium chloride flush  3 mL Intravenous Q12H   Continuous Infusions:  sodium chloride     nitroGLYCERIN 20 mcg/min (09/18/19 0501)   PRN Meds: sodium chloride, acetaminophen, albuterol, ondansetron (ZOFRAN) IV, sodium chloride flush   Vital Signs    Vitals:   09/18/19 0300 09/18/19 0330 09/18/19 0400 09/18/19 0506  BP: (!) 137/97 (!) 127/91 (!) 144/97   Pulse: 65 65 72 73  Resp:    18  Temp:      TempSrc:      SpO2: 99% 97% 100% 99%  Weight:      Height:        Intake/Output Summary (Last 24 hours) at 09/18/2019 0745 Last data filed at 09/17/2019 2029 Gross per 24 hour  Intake --  Output 1325 ml  Net -1325 ml   Filed Weights   09/17/19 2127  Weight: 68 kg   Last Weight  Most recent update: 09/17/2019  9:27 PM   Weight  68 kg (150 lb)           Weight change:    Telemetry    SR - Personally Reviewed  ECG    None today - Personally Reviewed  Physical Exam   Affect appropriate Thin black male  HEENT: normal Neck supple with no adenopathy JVP normal no bruits no thyromegaly Lungs clear with no wheezing and good diaphragmatic motion Heart:  S1/S2 no murmur, no rub, gallop or click PMI enlarged and displaced  Abdomen: benighn, BS positve, no tenderness, no AAA no bruit.  No HSM or HJR Distal pulses intact with no bruits No edema Neuro non-focal Skin warm and dry No muscular weakness   Labs    Hematology Recent Labs  Lab 09/17/19 1710  WBC 3.8*  RBC 5.09  HGB 16.1  HCT 49.9  MCV 98.0  MCH 31.6  MCHC 32.3  RDW 13.1  PLT 208     Chemistry Recent Labs  Lab 09/17/19 1710 09/18/19 0500  NA 140 137  K 5.4* 2.9*  CL 108 109  CO2 21* 18*  GLUCOSE 101* 112*  BUN 15 12  CREATININE 1.29* 1.05  CALCIUM 8.8* 7.3*  PROT 6.8  --   ALBUMIN 3.8  --   AST 69*  --   ALT 54*  --   ALKPHOS 60  --   BILITOT 1.5*  --   GFRNONAA >60 >60  GFRAA >60 >60  ANIONGAP 11 10     High Sensitivity Troponin:   Recent Labs  Lab 09/17/19 1710 09/17/19 2059  TROPONINIHS 18* 16      BNP Recent Labs  Lab 09/17/19 1900  BNP 570.5*    No results found for: ESRSEDRATE, POCTSEDRATE   DDimer No results for input(s): DDIMER in the last 168 hours.   Radiology    DG Chest Port 1 View  Result Date: 09/17/2019 CLINICAL DATA:  Chest pain, STEMI EXAM: PORTABLE CHEST 1 VIEW COMPARISON:  03/19/2018 FINDINGS: Mild mid and lower lung zone interstitial pulmonary infiltrate is present most in keeping with  interstitial pulmonary edema. There is interval enlargement of the hila bilaterally likely representing central pulmonary arterial enlargement and suggestive of underlying pulmonary arterial hypertension. No pneumothorax or pleural effusion. Cardiac size is at the upper limits of normal. No acute bone abnormality. IMPRESSION: 1. Mild interstitial pulmonary edema. 2. Interval enlargement of the hila bilaterally likely representing central pulmonary arterial enlargement and suggestive of underlying pulmonary arterial hypertension. 3. Heart size at the upper limits of normal, likely related to low lung volumes. Electronically Signed   By: Fidela Salisbury MD   On: 09/17/2019 17:46     Cardiac Studies   ECHO:  ordered  Patient Profile     59 y.o. male w/ hx S-CHF, no CAD, EF 20-25%, polysubs abuse, HTN, was admitted 09/14 with HTN urgency and CHF exacerbation.   Assessment & Plan    1. Acute on chronic systolic CHF - continue Lasix 60 mg IV bid for today, may be able to change to po in am - f/u on echo results - will ck ESR and CRP    2. Hypokalemia - Will order 80 meq this am, recheck later today, will likely need add'l supplement  3. HTN urgency - BP is much improved on home dose of Coreg 6.25 mg bid (compliance issues) - losartan 100 mg qd changed to irbesartan 150 mg qd - still on nitro gtt at 20 mcg/min change to oral nitrates  - diastolic BP remains high but doubt compliance w/ hydralazine  Otherwise, per IM Principal Problem:   Acute on chronic systolic CHF (congestive heart failure) ([place]) Active Problems:   Cocaine abuse ([place])   Current smoker   HTN (hypertension)   NICM (nonischemic cardiomyopathy) ([place])    Signed, Rosaria Ferries , PA-C 7:45 AM 09/18/2019 Pager: [phone]

## 2019-09-18 NOTE — Progress Notes (Signed)
  Echocardiogram 2D Echocardiogram has been performed.  Matilde Bash 09/18/2019, 12:06 PM

## 2019-09-19 LAB — BASIC METABOLIC PANEL
Anion gap: 9 (ref 5–15)
BUN: 15 mg/dL (ref 6–20)
CO2: 24 mmol/L (ref 22–32)
Calcium: 8.8 mg/dL — ABNORMAL LOW (ref 8.9–10.3)
Chloride: 102 mmol/L (ref 98–111)
Creatinine, Ser: 1.25 mg/dL — ABNORMAL HIGH (ref 0.61–1.24)
GFR calc Af Amer: >60 mL/min (ref >60)
GFR calc non Af Amer: >60 mL/min (ref >60)
Glucose, Bld: 101 mg/dL — ABNORMAL HIGH (ref 70–99)
Potassium: 3.7 mmol/L (ref 3.5–5.1)
Sodium: 135 mmol/L (ref 135–145)

## 2019-09-19 MED ORDER — ISOSORBIDE MONONITRATE ER 60 MG PO TB24
60.0000 mg | ORAL_TABLET | Freq: Every day | ORAL | Status: DC
  Administered 2019-09-20: 60 mg via ORAL
  Filled 2019-09-19: qty 1

## 2019-09-19 MED ORDER — ISOSORBIDE MONONITRATE ER 30 MG PO TB24
30.0000 mg | ORAL_TABLET | Freq: Once | ORAL | Status: DC
  Filled 2019-09-19: qty 1

## 2019-09-19 MED ORDER — POTASSIUM CHLORIDE CRYS ER 20 MEQ PO TBCR
40.0000 meq | EXTENDED_RELEASE_TABLET | Freq: Once | ORAL | Status: AC
  Administered 2019-09-19: 40 meq via ORAL
  Filled 2019-09-19: qty 2

## 2019-09-19 NOTE — Progress Notes (Addendum)
Progress Note  Patient Name: Charles Cervantes Date of Encounter: 09/19/2019  Primary Cardiologist:  Pixie Casino, MD 2017  Subjective   Breathing well, no CP Says MD told him he was not going home today, disappointed  Inpatient Medications    Scheduled Meds:  carvedilol  6.25 mg Oral BID WC   enoxaparin (LOVENOX) injection  40 mg Subcutaneous QHS   furosemide  60 mg Intravenous Q12H   irbesartan  150 mg Oral Daily   isosorbide mononitrate  30 mg Oral Daily   nicotine  21 mg Transdermal Daily   sodium chloride flush  3 mL Intravenous Q12H   Continuous Infusions:  sodium chloride     PRN Meds: sodium chloride, acetaminophen, albuterol, ondansetron (ZOFRAN) IV, sodium chloride flush   Vital Signs    Vitals:   09/18/19 2011 09/18/19 2025 09/18/19 2358 09/19/19 0612  BP:  (!) 153/105 (!) 131/100 (!) 135/99  Pulse:  63 74 60  Resp:  16 18 20   Temp:  98.3 F (36.8 C) 97.8 F (36.6 C) 98.3 F (36.8 C)  TempSrc:  Oral Oral Oral  SpO2:  98% 100% 100%  Weight: 70 kg   67.9 kg  Height: 6' 1.5" (1.867 m)       Intake/Output Summary (Last 24 hours) at 09/19/2019 0753 Last data filed at 09/19/2019 0614 Gross per 24 hour  Intake 360 ml  Output 3400 ml  Net -3040 ml   Filed Weights   09/17/19 2127 09/18/19 2011 09/19/19 0612  Weight: 68 kg 70 kg 67.9 kg   Last Weight  Most recent update: 09/19/2019  6:14 AM    Weight  67.9 kg (149 lb 12.8 oz)            Weight change: 1.95 kg   Telemetry    SR, sinus brady 50s, pr PVCs- Personally Reviewed  ECG    None today - Personally Reviewed  Physical Exam   GEN: No acute distress.   Neck: No JVD Cardiac: RRR, no murmur, no rubs, or gallops.  Respiratory: clear to auscultation bilaterally GI: Soft, nontender, non-distended  MS: No edema; No deformity. Skin: no rashes or lesions Neuro:  Nonfocal  Psych: Normal affect   Labs    Hematology Recent Labs  Lab 09/17/19 1710  WBC 3.8*  RBC 5.09  HGB 16.1    HCT 49.9  MCV 98.0  MCH 31.6  MCHC 32.3  RDW 13.1  PLT 208    Chemistry Recent Labs  Lab 09/17/19 1710 09/17/19 1710 09/18/19 0500 09/18/19 2047 09/19/19 0635  NA 140   < > 137 132* 135  K 5.4*   < > 2.9* 4.1 3.7  CL 108   < > 109 101 102  CO2 21*   < > 18* 22 24  GLUCOSE 101*   < > 112* 94 101*  BUN 15   < > 12 14 15   CREATININE 1.29*   < > 1.05 1.57* 1.25*  CALCIUM 8.8*   < > 7.3* 8.5* 8.8*  PROT 6.8  --   --   --   --   ALBUMIN 3.8  --   --   --   --   AST 69*  --   --   --   --   ALT 54*  --   --   --   --   ALKPHOS 60  --   --   --   --   BILITOT 1.5*  --   --   --   --  GFRNONAA >60   < > >60 48* >60  GFRAA >60   < > >60 55* >60  ANIONGAP 11   < > 10 9 9    < > = values in this interval not displayed.     High Sensitivity Troponin:   Recent Labs  Lab 09/17/19 1710 09/17/19 2059  TROPONINIHS 18* 16      BNP Recent Labs  Lab 09/17/19 1900  BNP 570.5*    Lab Results  Component Value Date   ESRSEDRATE 1 09/18/2019   CRP: In process  Radiology    DG Chest Port 1 View  Result Date: 09/17/2019 CLINICAL DATA:  Chest pain, STEMI EXAM: PORTABLE CHEST 1 VIEW COMPARISON:  03/19/2018 FINDINGS: Mild mid and lower lung zone interstitial pulmonary infiltrate is present most in keeping with interstitial pulmonary edema. There is interval enlargement of the hila bilaterally likely representing central pulmonary arterial enlargement and suggestive of underlying pulmonary arterial hypertension. No pneumothorax or pleural effusion. Cardiac size is at the upper limits of normal. No acute bone abnormality. IMPRESSION: 1. Mild interstitial pulmonary edema. 2. Interval enlargement of the hila bilaterally likely representing central pulmonary arterial enlargement and suggestive of underlying pulmonary arterial hypertension. 3. Heart size at the upper limits of normal, likely related to low lung volumes. Electronically Signed   By: Fidela Salisbury MD   On: 09/17/2019 17:46    ECHOCARDIOGRAM COMPLETE  Result Date: 09/18/2019    ECHOCARDIOGRAM REPORT   Patient Name:   Charles Cervantes Date of Exam: 09/18/2019 Medical Rec #:  [id number]      Height:       73.0 in Accession #:    [id number]     Weight:       150.0 lb Date of Birth:  May 11, 1960       BSA:          1.904 m Patient Age:    59 years       BP:           125/91 mmHg Patient Gender: M              HR:           59 bpm. Exam Location:  Inpatient Procedure: 2D Echo, Cardiac Doppler and Color Doppler Indications:    CHF  History:        Patient has prior history of Echocardiogram examinations, most                 recent 07/02/2014. CHF, COPD, Signs/Symptoms:Shortness of Breath                 and Chest Pain; Risk Factors:Current Smoker, Hypertension and                 Dyslipidemia. Cocaine abuse.  Sonographer:    Dustin Flock Referring Phys: [place]  1. Left ventricular ejection fraction, by estimation, is 20 to 25%. The left ventricle has severely decreased function. The left ventricle demonstrates global hypokinesis. The left ventricular internal cavity size was mildly dilated. There is moderate left ventricular hypertrophy. Left ventricular diastolic parameters are indeterminate.  2. Right ventricular systolic function is normal. The right ventricular size is normal.  3. Left atrial size was mildly dilated.  4. The mitral valve is grossly normal. Trivial mitral valve regurgitation. No evidence of mitral stenosis.  5. The aortic valve is normal in structure. Aortic valve regurgitation is not visualized. No aortic stenosis is present. FINDINGS  Left Ventricle: Left ventricular ejection fraction, by estimation, is 20 to 25%. The left ventricle has severely decreased function. The left ventricle demonstrates global hypokinesis. The left ventricular internal cavity size was mildly dilated. There is moderate left ventricular hypertrophy. Left ventricular diastolic parameters are indeterminate. Right  Ventricle: The right ventricular size is normal. No increase in right ventricular wall thickness. Right ventricular systolic function is normal. Left Atrium: Left atrial size was mildly dilated. Right Atrium: Right atrial size was normal in size. Pericardium: There is no evidence of pericardial effusion. Mitral Valve: The mitral valve is grossly normal. Trivial mitral valve regurgitation. No evidence of mitral valve stenosis. Tricuspid Valve: The tricuspid valve is grossly normal. Tricuspid valve regurgitation is not demonstrated. Aortic Valve: The aortic valve is normal in structure. Aortic valve regurgitation is not visualized. No aortic stenosis is present. Pulmonic Valve: The pulmonic valve was normal in structure. Pulmonic valve regurgitation is not visualized. No evidence of pulmonic stenosis. Aorta: The aortic root and ascending aorta are structurally normal, with no evidence of dilitation. IAS/Shunts: The atrial septum is grossly normal.  LEFT VENTRICLE PLAX 2D LVIDd:         6.10 cm  Diastology LVIDs:         5.30 cm  LV e' medial:    4.03 cm/s LV PW:         1.40 cm  LV E/e' medial:  8.4 LV IVS:        1.40 cm  LV e' lateral:   6.20 cm/s LVOT diam:     2.60 cm  LV E/e' lateral: 5.5 LV SV:         63 LV SV Index:   33 LVOT Area:     5.31 cm  RIGHT VENTRICLE RV Basal diam:  3.40 cm RV S prime:     12.80 cm/s TAPSE (M-mode): 2.4 cm LEFT ATRIUM             Index       RIGHT ATRIUM           Index LA diam:        4.20 cm 2.21 cm/m  RA Area:     13.80 cm LA Vol (A2C):   77.5 ml 40.70 ml/m RA Volume:   34.30 ml  18.01 ml/m LA Vol (A4C):   65.9 ml 34.61 ml/m LA Biplane Vol: 71.5 ml 37.55 ml/m  AORTIC VALVE LVOT Vmax:   68.10 cm/s LVOT Vmean:  41.700 cm/s LVOT VTI:    0.118 m  AORTA Ao Root diam: 3.40 cm MITRAL VALVE MV Area (PHT): 1.89 cm    SHUNTS MV Decel Time: 401 msec    Systemic VTI:  0.12 m MV E velocity: 34.00 cm/s  Systemic Diam: 2.60 cm MV A velocity: 35.80 cm/s MV E/A ratio:  0.95 Mertie Moores  MD Electronically signed by Mertie Moores MD Signature Date/Time: 09/18/2019/2:00:58 PM    Final      Cardiac Studies   ECHO:  09/18/2019  1. Left ventricular ejection fraction, by estimation, is 20 to 25%. The  left ventricle has severely decreased function. The left ventricle  demonstrates global hypokinesis. The left ventricular internal cavity size  was mildly dilated. There is moderate left ventricular hypertrophy. Left ventricular diastolic parameters are indeterminate.   2. Right ventricular systolic function is normal. The right ventricular  size is normal.   3. Left atrial size was mildly dilated.   4. The mitral valve is grossly normal. Trivial mitral valve  regurgitation. No evidence of mitral stenosis.   5. The aortic valve is normal in structure. Aortic valve regurgitation is  not visualized. No aortic stenosis is present.   Patient Profile     59 y.o. male w/ hx S-CHF, no CAD, EF 20-25%, polysubs abuse, HTN, was admitted 09/14 with HTN urgency and CHF exacerbation.   Assessment & Plan    1. Acute on chronic systolic CHF, NICM - on Lasix 60 mg IV BID, has gotten am dose - discuss w/ MD changing to po rx  - CRP pending ESR ok, minimal trop elevation, feel CP is MS - continue BB, Irbesartan - if compliant w/ f/u, work on getting him Entresto  2. Hypokalemia - K+ 2.9 >> 4.1 after 80 meq - K+ 3.7 this am - give 40 meq today, adjust once Lasix dose finalized  3. HTN urgency - SBP much improved, DBP still > 95 - will increase Imdur to 60 mg qd to see if this helps DBP - suspect compliance w/ hydralazine would be poor - otherwise, per IM  Otherwise, per IM Principal Problem:   Acute on chronic systolic CHF (congestive heart failure) ([place]) Active Problems:   Cocaine abuse ([place])   Current smoker   HTN (hypertension)   NICM (nonischemic cardiomyopathy) ([place])   Acute CHF (congestive heart failure) ([place])    Signed, Rosaria Ferries , PA-C 7:53  AM 09/19/2019 Pager: [phone]  Patient examined chart reviewed. He has some coughing spells. Lungs improved no murmur PMI increased and displaced not volume overloaded On exam. K improved Agree with increasing nitrates for diastolic BP Suspect he can be d/c in am with cardiology f/u Dr. Remo Lipps MD [facility]

## 2019-09-19 NOTE — Progress Notes (Signed)
[facility]                                                                              Patient Demographics  Charles Cervantes, is a 59 y.o. male, DOB - [birth year], MNO:[id number]  Admit date - 09/17/2019   Admitting Physician Etta Quill, DO  Outpatient Primary MD for the patient is Charles Dad, MD  Outpatient specialists:   LOS - 1  days   Medical records reviewed and are as summarized below:    Chief Complaint  Patient presents with   Code STEMI       Brief summary   Patient is a 59 year old male with history of systolic congestive heart failure, EF 20 to 25% in 2014, improving over time, ongoing cocaine abuse, last use the night before the admission, smoking.  Presented with chest pain and shortness of breath.  Initially presented as possible STEMI however not felt to be STEMI by cardiology. COVID-19 negative  Assessment & Plan    Principal Problem:   Acute on chronic systolic CHF (congestive heart failure) (HCC) -In the setting of uncontrolled hypertension, cocaine use, OSA, tobacco use.  Normal cath in 2014 in [place], EF normalized to 55 to 60% on echo in 2016.  EKG showed LVH with strain pattern.  BP on arrival 163/118 -Nitro drip weaned off -Overall improving, on Lasix 60 mg IV twice daily, transition to p.o. per cardiology -Negative balance of 4.7 L, continue strict I's and O's and daily weights  Active Problems: Chest pain in the setting of cocaine use and uncontrolled hypertension, history of noncompliance, history of nonischemic cardiomyopathy -Nitro drip weaned off, states still has chest pain 5/10 -Cardiology following, appreciate recommendations -ESR low, 2D echo with EF 20 to 25%, severely decreased function global hypokinesis moderate LVH    Cocaine abuse ([place]) -Counseled the patient on cocaine cessation    Current smoker -Place nicotine patch  Severe hypokalemia Potassium 2.9 on admission -Resolved  Acute kidney  injury -Creatinine 1.5 on 9/15, possibly due to uncontrolled hypertension, CHF, cocaine use, diuresis -Improved to 1.2 today   Moderate protein calorie malnutrition, underweight Estimated body mass index is 19.5 kg/m as calculated from the following:   Height as of this encounter: 6' 1.5" (1.867 m).   Weight as of this encounter: 67.9 kg.  Code Status: Full CODE STATUS DVT Prophylaxis:  Lovenox  Family Communication: Discussed all imaging results, lab results, explained to the patient   Disposition Plan:     Status is: Inpatient  Remains inpatient appropriate because:Inpatient level of care appropriate due to severity of illness   Dispo: The patient is from: Home              Anticipated d/c is to: Home              Anticipated d/c date is: 3 days              Patient currently is not medically stable to d/c.  Hopefully DC home in a.m. per cardiology   Time Spent in minutes 25 minutes  Procedures:  2D echo  Consultants:   Cardiology  Antimicrobials:  Anti-infectives (From admission, onward)   None         Medications  Scheduled Meds:  carvedilol  6.25 mg Oral BID WC   enoxaparin (LOVENOX) injection  40 mg Subcutaneous QHS   furosemide  60 mg Intravenous Q12H   irbesartan  150 mg Oral Daily   isosorbide mononitrate  30 mg Oral Once   [START ON 09/20/2019] isosorbide mononitrate  60 mg Oral Daily   nicotine  21 mg Transdermal Daily   sodium chloride flush  3 mL Intravenous Q12H   Continuous Infusions:  sodium chloride     PRN Meds:.sodium chloride, acetaminophen, albuterol, ondansetron (ZOFRAN) IV, sodium chloride flush      Subjective:   Charles Cervantes was seen and examined today.  States chest pain 5/10, no acute shortness of breath.  No nausea vomiting no fevers.  Off the nitroglycerin drip.   Patient denies dizziness, abdominal pain, N/V/D/C, new weakness, numbess, tingling. No acute events overnight.    Objective:   Vitals:   09/19/19  0612 09/19/19 0800 09/19/19 1046 09/19/19 1048  BP: (!) 135/99 (!) 139/101 94/73 100/71  Pulse: 60 76 76 73  Resp: 20 18 16    Temp: 98.3 F (36.8 C)     TempSrc: Oral     SpO2: 100% 99% 99% 99%  Weight: 67.9 kg     Height:        Intake/Output Summary (Last 24 hours) at 09/19/2019 1447 Last data filed at 09/19/2019 1046 Gross per 24 hour  Intake 582 ml  Output 2700 ml  Net -2118 ml     Wt Readings from Last 3 Encounters:  09/19/19 67.9 kg  04/24/18 77.1 kg  04/15/18 72.6 kg   Physical Exam  General: Alert and oriented x 3, NAD  Cardiovascular: S1 S2 clear, RRR. No pedal edema b/l  Respiratory: CTAB, no wheezing, rales or rhonchi  Gastrointestinal: Soft, nontender, nondistended, NBS  Ext: no pedal edema bilaterally  Neuro: no new deficits  Musculoskeletal: No cyanosis, clubbing  Skin: No rashes  Psych: Normal affect and demeanor, alert and oriented x3     Data Reviewed:  I have personally reviewed following labs and imaging studies  Micro Results Recent Results (from the past 240 hour(s))  SARS Coronavirus 2 by RT PCR (hospital order, performed in [facility] lab) Nasopharyngeal Nasopharyngeal Swab     Status: None   Collection Time: 09/17/19  5:26 PM   Specimen: Nasopharyngeal Swab  Result Value Ref Range Status   SARS Coronavirus 2 NEGATIVE NEGATIVE Final    Comment: (NOTE) SARS-CoV-2 target nucleic acids are NOT DETECTED.  The SARS-CoV-2 RNA is generally detectable in upper and lower respiratory specimens during the acute phase of infection. The lowest concentration of SARS-CoV-2 viral copies this assay can detect is 250 copies / mL. A negative result does not preclude SARS-CoV-2 infection and should not be used as the sole basis for treatment or other patient management decisions.  A negative result may occur with improper specimen collection / handling, submission of specimen other than nasopharyngeal swab, presence of viral mutation(s)  within the areas targeted by this assay, and inadequate number of viral copies (<250 copies / mL). A negative result must be combined with clinical observations, patient history, and epidemiological information.  Fact Sheet for Patients:   StrictlyIdeas.no  Fact Sheet for Healthcare Providers: BankingDealers.co.za  This test is not yet approved or  cleared by the [place] FDA and has been authorized for detection and/or diagnosis  of SARS-CoV-2 by FDA under an Emergency Use Authorization (EUA).  This EUA will remain in effect (meaning this test can be used) for the duration of the COVID-19 declaration under Section 564(b)(1) of the Act, 21 U.S.C. section 360bbb-3(b)(1), unless the authorization is terminated or revoked sooner.  Performed at [facility], [place] [address], [place], [place] [address]     Radiology Reports DG Chest [place] 1 View  Result Date: 09/17/2019 CLINICAL DATA:  Chest pain, STEMI EXAM: PORTABLE CHEST 1 VIEW COMPARISON:  03/19/2018 FINDINGS: Mild mid and lower lung zone interstitial pulmonary infiltrate is present most in keeping with interstitial pulmonary edema. There is interval enlargement of the hila bilaterally likely representing central pulmonary arterial enlargement and suggestive of underlying pulmonary arterial hypertension. No pneumothorax or pleural effusion. Cardiac size is at the upper limits of normal. No acute bone abnormality. IMPRESSION: 1. Mild interstitial pulmonary edema. 2. Interval enlargement of the hila bilaterally likely representing central pulmonary arterial enlargement and suggestive of underlying pulmonary arterial hypertension. 3. Heart size at the upper limits of normal, likely related to low lung volumes. Electronically Signed   By: Fidela Salisbury MD   On: 09/17/2019 17:46   ECHOCARDIOGRAM COMPLETE  Result Date: 09/18/2019    ECHOCARDIOGRAM REPORT   Patient Name:   Charles Cervantes  Date of Exam: 09/18/2019 Medical Rec #:  [id number]      Height:       73.0 in Accession #:    [id number]     Weight:       150.0 lb Date of Birth:  [birth year]       BSA:          1.904 m Patient Age:    21 years       BP:           125/91 mmHg Patient Gender: M              HR:           59 bpm. Exam Location:  Inpatient Procedure: 2D Echo, Cardiac Doppler and Color Doppler Indications:    CHF  History:        Patient has prior history of Echocardiogram examinations, most                 recent 07/02/2014. CHF, COPD, Signs/Symptoms:Shortness of Breath                 and Chest Pain; Risk Factors:Current Smoker, Hypertension and                 Dyslipidemia. Cocaine abuse.  Sonographer:    Dustin Flock Referring Phys: [place]  1. Left ventricular ejection fraction, by estimation, is 20 to 25%. The left ventricle has severely decreased function. The left ventricle demonstrates global hypokinesis. The left ventricular internal cavity size was mildly dilated. There is moderate left ventricular hypertrophy. Left ventricular diastolic parameters are indeterminate.  2. Right ventricular systolic function is normal. The right ventricular size is normal.  3. Left atrial size was mildly dilated.  4. The mitral valve is grossly normal. Trivial mitral valve regurgitation. No evidence of mitral stenosis.  5. The aortic valve is normal in structure. Aortic valve regurgitation is not visualized. No aortic stenosis is present. FINDINGS  Left Ventricle: Left ventricular ejection fraction, by estimation, is 20 to 25%. The left ventricle has severely decreased function. The left ventricle demonstrates global hypokinesis. The left ventricular internal cavity size was  mildly dilated. There is moderate left ventricular hypertrophy. Left ventricular diastolic parameters are indeterminate. Right Ventricle: The right ventricular size is normal. No increase in right ventricular wall thickness. Right ventricular  systolic function is normal. Left Atrium: Left atrial size was mildly dilated. Right Atrium: Right atrial size was normal in size. Pericardium: There is no evidence of pericardial effusion. Mitral Valve: The mitral valve is grossly normal. Trivial mitral valve regurgitation. No evidence of mitral valve stenosis. Tricuspid Valve: The tricuspid valve is grossly normal. Tricuspid valve regurgitation is not demonstrated. Aortic Valve: The aortic valve is normal in structure. Aortic valve regurgitation is not visualized. No aortic stenosis is present. Pulmonic Valve: The pulmonic valve was normal in structure. Pulmonic valve regurgitation is not visualized. No evidence of pulmonic stenosis. Aorta: The aortic root and ascending aorta are structurally normal, with no evidence of dilitation. IAS/Shunts: The atrial septum is grossly normal.  LEFT VENTRICLE PLAX 2D LVIDd:         6.10 cm  Diastology LVIDs:         5.30 cm  LV e' medial:    4.03 cm/s LV PW:         1.40 cm  LV E/e' medial:  8.4 LV IVS:        1.40 cm  LV e' lateral:   6.20 cm/s LVOT diam:     2.60 cm  LV E/e' lateral: 5.5 LV SV:         63 LV SV Index:   33 LVOT Area:     5.31 cm  RIGHT VENTRICLE RV Basal diam:  3.40 cm RV S prime:     12.80 cm/s TAPSE (M-mode): 2.4 cm LEFT ATRIUM             Index       RIGHT ATRIUM           Index LA diam:        4.20 cm 2.21 cm/m  RA Area:     13.80 cm LA Vol (A2C):   77.5 ml 40.70 ml/m RA Volume:   34.30 ml  18.01 ml/m LA Vol (A4C):   65.9 ml 34.61 ml/m LA Biplane Vol: 71.5 ml 37.55 ml/m  AORTIC VALVE LVOT Vmax:   68.10 cm/s LVOT Vmean:  41.700 cm/s LVOT VTI:    0.118 m  AORTA Ao Root diam: 3.40 cm MITRAL VALVE MV Area (PHT): 1.89 cm    SHUNTS MV Decel Time: 401 msec    Systemic VTI:  0.12 m MV E velocity: 34.00 cm/s  Systemic Diam: 2.60 cm MV A velocity: 35.80 cm/s MV E/A ratio:  0.95 Mertie Moores MD Electronically signed by Mertie Moores MD Signature Date/Time: 09/18/2019/2:00:58 PM    Final     Lab  Data:  CBC: Recent Labs  Lab 09/17/19 1710  WBC 3.8*  NEUTROABS 1.8  HGB 16.1  HCT 49.9  MCV 98.0  PLT 165   Basic Metabolic Panel: Recent Labs  Lab 09/17/19 1710 09/18/19 0500 09/18/19 2047 09/19/19 0635  NA 140 137 132* 135  K 5.4* 2.9* 4.1 3.7  CL 108 109 101 102  CO2 21* 18* 22 24  GLUCOSE 101* 112* 94 101*  BUN 15 12 14 15   CREATININE 1.29* 1.05 1.57* 1.25*  CALCIUM 8.8* 7.3* 8.5* 8.8*  MG  --   --  1.8  --    GFR: Estimated Creatinine Clearance: 61.9 mL/min (A) (by C-G formula based on SCr of 1.25 mg/dL (H)). Liver Function  Tests: Recent Labs  Lab 09/17/19 1710  AST 69*  ALT 54*  ALKPHOS 60  BILITOT 1.5*  PROT 6.8  ALBUMIN 3.8   No results for input(s): LIPASE, AMYLASE in the last 168 hours. No results for input(s): AMMONIA in the last 168 hours. Coagulation Profile: Recent Labs  Lab 09/17/19 1710  INR 0.9   Cardiac Enzymes: No results for input(s): CKTOTAL, CKMB, CKMBINDEX, TROPONINI in the last 168 hours. BNP (last 3 results) No results for input(s): PROBNP in the last 8760 hours. HbA1C: Recent Labs    09/17/19 1701  HGBA1C 5.0   CBG: No results for input(s): GLUCAP in the last 168 hours. Lipid Profile: Recent Labs    09/17/19 1701  CHOL 174  HDL 99  LDLCALC 65  TRIG 52  CHOLHDL 1.8   Thyroid Function Tests: No results for input(s): TSH, T4TOTAL, FREET4, T3FREE, THYROIDAB in the last 72 hours. Anemia Panel: No results for input(s): VITAMINB12, FOLATE, FERRITIN, TIBC, IRON, RETICCTPCT in the last 72 hours. Urine analysis:    Component Value Date/Time   COLORURINE YELLOW 06/29/2012 1518   APPEARANCEUR HAZY (A) 06/29/2012 1518   LABSPEC 1.023 06/29/2012 1518   PHURINE 6.0 06/29/2012 1518   GLUCOSEU NEGATIVE 06/29/2012 1518   HGBUR NEGATIVE 06/29/2012 1518   BILIRUBINUR SMALL (A) 06/29/2012 1518   KETONESUR 15 (A) 06/29/2012 1518   PROTEINUR NEGATIVE 06/29/2012 1518   UROBILINOGEN 0.2 06/29/2012 1518   NITRITE NEGATIVE  06/29/2012 1518   LEUKOCYTESUR SMALL (A) 06/29/2012 1518     Lylia Karn M.D. [facility] 09/19/2019, 2:47 PM   Call night coverage person covering after 7pm

## 2019-09-19 NOTE — Progress Notes (Signed)
Pt c/o dizziness and shortness of breath. 2L oxygen placed,  BP 94/73 > 100/77. Pt only received 30mg  of Imdur this morning. Barrett, PA paged and verbal order received to hold new order for additional 30mg  of Imdur and monitor BP. Oncoming RN Paulette, RN also made aware.

## 2019-09-20 ENCOUNTER — Encounter (HOSPITAL_COMMUNITY): Payer: Self-pay | Admitting: Internal Medicine

## 2019-09-20 ENCOUNTER — Ambulatory Visit: Payer: Medicaid Other | Admitting: Physician Assistant

## 2019-09-20 ENCOUNTER — Other Ambulatory Visit: Payer: Self-pay

## 2019-09-20 LAB — BASIC METABOLIC PANEL
Anion gap: 5 (ref 5–15)
BUN: 20 mg/dL (ref 6–20)
CO2: 22 mmol/L (ref 22–32)
Calcium: 8.6 mg/dL — ABNORMAL LOW (ref 8.9–10.3)
Chloride: 104 mmol/L (ref 98–111)
Creatinine, Ser: 1.33 mg/dL — ABNORMAL HIGH (ref 0.61–1.24)
GFR calc Af Amer: >60 mL/min (ref >60)
GFR calc non Af Amer: 59 mL/min — ABNORMAL LOW (ref >60)
Glucose, Bld: 92 mg/dL (ref 70–99)
Potassium: 4 mmol/L (ref 3.5–5.1)
Sodium: 131 mmol/L — ABNORMAL LOW (ref 135–145)

## 2019-09-20 LAB — HIGH SENSITIVITY CRP: CRP, High Sensitivity: 6.83 mg/L — ABNORMAL HIGH (ref 0.00–3.00)

## 2019-09-20 MED ORDER — ISOSORBIDE MONONITRATE ER 60 MG PO TB24
60.0000 mg | ORAL_TABLET | Freq: Every day | ORAL | 1 refills | Status: DC
Start: 2019-09-21 — End: 2020-01-01

## 2019-09-20 MED ORDER — LOSARTAN POTASSIUM 100 MG PO TABS
100.0000 mg | ORAL_TABLET | Freq: Every day | ORAL | 1 refills | Status: DC
Start: 2019-09-20 — End: 2019-09-26

## 2019-09-20 MED ORDER — FUROSEMIDE 40 MG PO TABS
40.0000 mg | ORAL_TABLET | Freq: Every day | ORAL | 2 refills | Status: DC
Start: 2019-09-20 — End: 2020-01-01

## 2019-09-20 NOTE — Discharge Instructions (Signed)
Weigh daily and if weight increases by 3 pounds in a day or 5 pounds in a week call Dr. Lysbeth Penner office.  Low salt foods No drugs like cocaine.   Heart Failure, Diagnosis  Heart failure means that your heart is not able to pump blood in the right way. This makes it hard for your body to work well. Heart failure is usually a long-term (chronic) condition. You must take good care of yourself and follow your treatment plan from your doctor. What are the causes? This condition may be caused by:  High blood pressure.  Build up of cholesterol and fat in the arteries.  Heart attack. This injures the heart muscle.  Heart valves that do not open and close properly.  Damage of the heart muscle. This is also called cardiomyopathy.  Lung disease.  Abnormal heart rhythms. What increases the risk? The risk of heart failure goes up as a person ages. This condition is also more likely to develop in people who:  Are overweight.  Are male.  Smoke or chew tobacco.  Abuse alcohol or illegal drugs.  Have taken medicines that can damage the heart.  Have diabetes.  Have abnormal heart rhythms.  Have thyroid problems.  Have low blood counts (anemia). What are the signs or symptoms? Symptoms of this condition include:  Shortness of breath.  Coughing.  Swelling of the feet, ankles, legs, or belly.  Losing weight for no reason.  Trouble breathing.  Waking from sleep because of the need to sit up and get more air.  Rapid heartbeat.  Being very tired.  Feeling dizzy, or feeling like you may pass out (faint).  Having no desire to eat.  Feeling like you may vomit (nauseous).  Peeing (urinating) more at night.  Feeling confused. How is this treated?     This condition may be treated with:  Medicines. These can be given to treat blood pressure and to make the heart muscles stronger.  Changes in your daily life. These may include eating a healthy diet, staying at a healthy  body weight, quitting tobacco and illegal drug use, or doing exercises.  Surgery. Surgery can be done to open blocked valves, or to put devices in the heart, such as pacemakers.  A donor heart (heart transplant). You will receive a healthy heart from a donor. Follow these instructions at home:  Treat other conditions as told by your doctor. These may include high blood pressure, diabetes, thyroid disease, or abnormal heart rhythms.  Learn as much as you can about heart failure.  Get support as you need it.  Keep all follow-up visits as told by your doctor. This is important. Summary  Heart failure means that your heart is not able to pump blood in the right way.  This condition is caused by high blood pressure, heart attack, or damage of the heart muscle.  Symptoms of this condition include shortness of breath and swelling of the feet, ankles, legs, or belly. You may also feel very tired or feel like you may vomit.  You may be treated with medicines, surgery, or changes in your daily life.  Treat other health conditions as told by your doctor. This information is not intended to replace advice given to you by your health care provider. Make sure you discuss any questions you have with your health care provider. Document Revised: 03/09/2018 Document Reviewed: 03/09/2018 Elsevier Patient Education  [place].   Heart Failure, Self Care Heart failure is a serious condition.  This sheet explains things you need to do to take care of yourself at home. To help you stay as healthy as possible, you may be asked to change your diet, take certain medicines, and make other changes in your life. Your doctor may also give you more specific instructions. If you have problems or questions, call your doctor. What are the risks? Having heart failure makes it more likely for you to have some problems. These problems can get worse if you do not take good care of yourself. Problems may  include:  Blood clotting problems. This may cause a stroke.  Damage to the kidneys, liver, or lungs.  Abnormal heart rhythms. Supplies needed:  Scale for weighing yourself.  Blood pressure monitor.  Notebook.  Medicines. How to care for yourself when you have heart failure Medicines Take over-the-counter and prescription medicines only as told by your doctor. Take your medicines every day.  Do not stop taking your medicine unless your doctor tells you to do so.  Do not skip any medicines.  Get your prescriptions refilled before you run out of medicine. This is important. Eating and drinking   Eat heart-healthy foods. Talk with a diet specialist (dietitian) to create an eating plan.  Choose foods that: ? Have no trans fat. ? Are low in saturated fat and cholesterol.  Choose healthy foods, such as: ? Fresh or frozen fruits and vegetables. ? Fish. ? Low-fat (lean) meats. ? Legumes, such as beans, peas, and lentils. ? Fat-free or low-fat dairy products. ? Whole-grain foods. ? High-fiber foods.  Limit salt (sodium) if told by your doctor. Ask your diet specialist to tell you which seasonings are healthy for your heart.  Cook in healthy ways instead of frying. Healthy ways of cooking include roasting, grilling, broiling, baking, poaching, steaming, and stir-frying.  Limit how much fluid you drink, if told by your doctor. Alcohol use  Do not drink alcohol if: ? Your doctor tells you not to drink. ? Your heart was damaged by alcohol, or you have very bad heart failure. ? You are pregnant, may be pregnant, or are planning to become pregnant.  If you drink alcohol: ? Limit how much you use to:  0-1 drink a day for women.  0-2 drinks a day for men. ? Be aware of how much alcohol is in your drink. In the [place] one drink equals one 12 oz bottle of beer (355 mL), one 5 oz glass of wine (148 mL), or one 1 oz glass of hard liquor (44 mL). Lifestyle   Do not use any  products that contain nicotine or tobacco, such as cigarettes, e-cigarettes, and chewing tobacco. If you need help quitting, ask your doctor. ? Do not use nicotine gum or patches before talking to your doctor.  Do not use illegal drugs.  Lose weight if told by your doctor.  Do physical activity if told by your doctor. Talk to your doctor before you begin an exercise if: ? You are an older adult. ? You have very bad heart failure.  Learn to manage stress. If you need help, ask your doctor.  Get rehab (rehabilitation) to help you stay independent and to help with your quality of life.  Plan time to rest when you get tired. Check weight and blood pressure   Weigh yourself every day. This will help you to know if fluid is building up in your body. ? Weigh yourself every morning after you pee (urinate) and before you eat breakfast. ?  Wear the same amount of clothing each time. ? Write down your daily weight. Give your record to your doctor.  Check and write down your blood pressure as told by your doctor.  Check your pulse as told by your doctor. Dealing with very hot and very cold weather  If it is very hot: ? Avoid activities that take a lot of energy. ? Use air conditioning or fans, or find a cooler place. ? Avoid caffeine and alcohol. ? Wear clothing that is loose-fitting, lightweight, and light-colored.  If it is very cold: ? Avoid activities that take a lot of energy. ? Layer your clothes. ? Wear mittens or gloves, a hat, and a scarf when you go outside. ? Avoid alcohol. Follow these instructions at home:  Stay up to date with shots (vaccines). Get pneumococcal and flu (influenza) shots.  Keep all follow-up visits as told by your doctor. This is important. Contact a doctor if:  You gain weight quickly.  You have increasing shortness of breath.  You cannot do your normal activities.  You get tired easily.  You cough a lot.  You don't feel like eating or feel  like you may vomit (nauseous).  You become puffy (swell) in your hands, feet, ankles, or belly (abdomen).  You cannot sleep well because it is hard to breathe.  You feel like your heart is beating fast (palpitations).  You get dizzy when you stand up. Get help right away if:  You have trouble breathing.  You or someone else notices a change in your behavior, such as having trouble staying awake.  You have chest pain or discomfort.  You pass out (faint). These symptoms may be an emergency. Do not wait to see if the symptoms will go away. Get medical help right away. Call your local emergency services (911 in the [place] Do not drive yourself to the hospital. Summary  Heart failure is a serious condition. To care for yourself, you may have to change your diet, take medicines, and make other lifestyle changes.  Take your medicines every day. Do not stop taking them unless your doctor tells you to do so.  Eat heart-healthy foods, such as fresh or frozen fruits and vegetables, fish, lean meats, legumes, fat-free or low-fat dairy products, and whole-grain or high-fiber foods.  Ask your doctor if you can drink alcohol. You may have to stop alcohol use if you have very bad heart failure.  Contact your doctor if you gain weight quickly or feel that your heart is beating too fast. Get help right away if you pass out, or have chest pain or trouble breathing. This information is not intended to replace advice given to you by your health care provider. Make sure you discuss any questions you have with your health care provider. Document Revised: 04/03/2018 Document Reviewed: 04/04/2018 Elsevier Patient Education  [place].

## 2019-09-20 NOTE — Care Management (Signed)
1039 09-20-19 Case Manager received consult for heart failure screen. Case Manager spoke with patient and he has a primary care provider and he gets his medications delivered to his home from [employer]. Patient states he is taking his medications as prescribed.  No home needs identified- patient is independent from home and has transportation. No further needs from Case Manager at this time. Graves-Bigelow, Ocie Cornfield, RN, BSN Case Manager

## 2019-09-20 NOTE — Progress Notes (Addendum)
Progress Note  Patient Name: Charles Cervantes Date of Encounter: 09/20/2019  [facility] Cardiologist: Pixie Casino, MD   Subjective   No chest pain no SOB lying flat in bed.   Inpatient Medications    Scheduled Meds:  carvedilol  6.25 mg Oral BID WC   enoxaparin (LOVENOX) injection  40 mg Subcutaneous QHS   furosemide  60 mg Intravenous Q12H   irbesartan  150 mg Oral Daily   isosorbide mononitrate  30 mg Oral Once   isosorbide mononitrate  60 mg Oral Daily   nicotine  21 mg Transdermal Daily   sodium chloride flush  3 mL Intravenous Q12H   Continuous Infusions:  sodium chloride     PRN Meds: sodium chloride, acetaminophen, albuterol, ondansetron (ZOFRAN) IV, sodium chloride flush   Vital Signs    Vitals:   09/19/19 1805 09/19/19 2046 09/20/19 0120 09/20/19 0546  BP: 120/87 106/76 126/82 (!) 125/95  Pulse:  67 75 64  Resp:  16 16 18   Temp:  98.4 F (36.9 C) 98.3 F (36.8 C) 97.9 F (36.6 C)  TempSrc:  Oral Oral Oral  SpO2:  98% 98% 96%  Weight:    69.6 kg  Height:    6' 1.5" (1.867 m)    Intake/Output Summary (Last 24 hours) at 09/20/2019 0721 Last data filed at 09/20/2019 0121 Gross per 24 hour  Intake 1182 ml  Output 2150 ml  Net -968 ml   Last 3 Weights 09/20/2019 09/19/2019 09/18/2019  Weight (lbs) 153 lb 6.4 oz 149 lb 12.8 oz 154 lb 4.8 oz  Weight (kg) 69.582 kg 67.949 kg 69.99 kg      Telemetry    SR - Personally Reviewed  ECG    No new - Personally Reviewed  Physical Exam   GEN: No acute distress.   Neck: No JVD Cardiac: RRR, no murmurs, rubs, or gallops.  Respiratory: Clear to auscultation bilaterally. GI: Soft, nontender, non-distended  MS: No edema; No deformity. Neuro:  Nonfocal  Psych: Normal affect   Labs    High Sensitivity Troponin:   Recent Labs  Lab 09/17/19 1710 09/17/19 2059  TROPONINIHS 18* 16      Chemistry Recent Labs  Lab 09/17/19 1710 09/17/19 1710 09/18/19 0500 09/18/19 2047 09/19/19 0635  NA 140    < > 137 132* 135  K 5.4*   < > 2.9* 4.1 3.7  CL 108   < > 109 101 102  CO2 21*   < > 18* 22 24  GLUCOSE 101*   < > 112* 94 101*  BUN 15   < > 12 14 15   CREATININE 1.29*   < > 1.05 1.57* 1.25*  CALCIUM 8.8*   < > 7.3* 8.5* 8.8*  PROT 6.8  --   --   --   --   ALBUMIN 3.8  --   --   --   --   AST 69*  --   --   --   --   ALT 54*  --   --   --   --   ALKPHOS 60  --   --   --   --   BILITOT 1.5*  --   --   --   --   GFRNONAA >60   < > >60 48* >60  GFRAA >60   < > >60 55* >60  ANIONGAP 11   < > 10 9 9    < > = values in  this interval not displayed.     Hematology Recent Labs  Lab 09/17/19 1710  WBC 3.8*  RBC 5.09  HGB 16.1  HCT 49.9  MCV 98.0  MCH 31.6  MCHC 32.3  RDW 13.1  PLT 208    BNP Recent Labs  Lab 09/17/19 1900  BNP 570.5*     DDimer No results for input(s): DDIMER in the last 168 hours.   Radiology    ECHOCARDIOGRAM COMPLETE  Result Date: 09/18/2019    ECHOCARDIOGRAM REPORT   Patient Name:   Charles Cervantes Date of Exam: 09/18/2019 Medical Rec #:  [id number]      Height:       73.0 in Accession #:    [id number]     Weight:       150.0 lb Date of Birth:  [birth year]       BSA:          1.904 m Patient Age:    59 years       BP:           125/91 mmHg Patient Gender: M              HR:           59 bpm. Exam Location:  Inpatient Procedure: 2D Echo, Cardiac Doppler and Color Doppler Indications:    CHF  History:        Patient has prior history of Echocardiogram examinations, most                 recent 07/02/2014. CHF, COPD, Signs/Symptoms:Shortness of Breath                 and Chest Pain; Risk Factors:Current Smoker, Hypertension and                 Dyslipidemia. Cocaine abuse.  Sonographer:    Dustin Flock Referring Phys: [place]  1. Left ventricular ejection fraction, by estimation, is 20 to 25%. The left ventricle has severely decreased function. The left ventricle demonstrates global hypokinesis. The left ventricular internal cavity size  was mildly dilated. There is moderate left ventricular hypertrophy. Left ventricular diastolic parameters are indeterminate.  2. Right ventricular systolic function is normal. The right ventricular size is normal.  3. Left atrial size was mildly dilated.  4. The mitral valve is grossly normal. Trivial mitral valve regurgitation. No evidence of mitral stenosis.  5. The aortic valve is normal in structure. Aortic valve regurgitation is not visualized. No aortic stenosis is present. FINDINGS  Left Ventricle: Left ventricular ejection fraction, by estimation, is 20 to 25%. The left ventricle has severely decreased function. The left ventricle demonstrates global hypokinesis. The left ventricular internal cavity size was mildly dilated. There is moderate left ventricular hypertrophy. Left ventricular diastolic parameters are indeterminate. Right Ventricle: The right ventricular size is normal. No increase in right ventricular wall thickness. Right ventricular systolic function is normal. Left Atrium: Left atrial size was mildly dilated. Right Atrium: Right atrial size was normal in size. Pericardium: There is no evidence of pericardial effusion. Mitral Valve: The mitral valve is grossly normal. Trivial mitral valve regurgitation. No evidence of mitral valve stenosis. Tricuspid Valve: The tricuspid valve is grossly normal. Tricuspid valve regurgitation is not demonstrated. Aortic Valve: The aortic valve is normal in structure. Aortic valve regurgitation is not visualized. No aortic stenosis is present. Pulmonic Valve: The pulmonic valve was normal in structure. Pulmonic valve regurgitation is not visualized. No evidence of  pulmonic stenosis. Aorta: The aortic root and ascending aorta are structurally normal, with no evidence of dilitation. IAS/Shunts: The atrial septum is grossly normal.  LEFT VENTRICLE PLAX 2D LVIDd:         6.10 cm  Diastology LVIDs:         5.30 cm  LV e' medial:    4.03 cm/s LV PW:         1.40 cm  LV  E/e' medial:  8.4 LV IVS:        1.40 cm  LV e' lateral:   6.20 cm/s LVOT diam:     2.60 cm  LV E/e' lateral: 5.5 LV SV:         63 LV SV Index:   33 LVOT Area:     5.31 cm  RIGHT VENTRICLE RV Basal diam:  3.40 cm RV S prime:     12.80 cm/s TAPSE (M-mode): 2.4 cm LEFT ATRIUM             Index       RIGHT ATRIUM           Index LA diam:        4.20 cm 2.21 cm/m  RA Area:     13.80 cm LA Vol (A2C):   77.5 ml 40.70 ml/m RA Volume:   34.30 ml  18.01 ml/m LA Vol (A4C):   65.9 ml 34.61 ml/m LA Biplane Vol: 71.5 ml 37.55 ml/m  AORTIC VALVE LVOT Vmax:   68.10 cm/s LVOT Vmean:  41.700 cm/s LVOT VTI:    0.118 m  AORTA Ao Root diam: 3.40 cm MITRAL VALVE MV Area (PHT): 1.89 cm    SHUNTS MV Decel Time: 401 msec    Systemic VTI:  0.12 m MV E velocity: 34.00 cm/s  Systemic Diam: 2.60 cm MV A velocity: 35.80 cm/s MV E/A ratio:  0.95 Mertie Moores MD Electronically signed by Mertie Moores MD Signature Date/Time: 09/18/2019/2:00:58 PM    Final     Cardiac Studies   ECHO:  09/18/2019  1. Left ventricular ejection fraction, by estimation, is 20 to 25%. The  left ventricle has severely decreased function. The left ventricle  demonstrates global hypokinesis. The left ventricular internal cavity size  was mildly dilated. There is moderate left ventricular hypertrophy. Left ventricular diastolic parameters are indeterminate.   2. Right ventricular systolic function is normal. The right ventricular  size is normal.   3. Left atrial size was mildly dilated.   4. The mitral valve is grossly normal. Trivial mitral valve  regurgitation. No evidence of mitral stenosis.   5. The aortic valve is normal in structure. Aortic valve regurgitation is  not visualized. No aortic stenosis is present.   Patient Profile     59 y.o. 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 gen cards appt  Hypokalemia  Replaced  HTN urgency improved  --BP 106/76 to 125/95   Tobacco use/cocaine use /hx sleep apnea per IM      For  questions or updates, please contact [place] Please consult [email or web address] for contact info under        Signed, Cecilie Kicks, NP  09/20/2019, 7:21 AM    Patient examined chart reviewed Doing well back to baseline Lungs clear PMI increased soft MR murmur at apex today No edema d/c with lasix 40 mg daily ARB coreg and nitrates He has outpatient f/u with Dr Jeffie Pollock on 9/23  Jenkins Rouge MD [facility]

## 2019-09-20 NOTE — Discharge Summary (Signed)
Physician Discharge Summary   Patient ID: Charles Cervantes MRN: [id number] DOB/AGE: 05-Sep-1960 59 y.o.  Admit date: 09/17/2019 Discharge date: 09/20/2019  Primary Care Physician:  [name], MD   Recommendations for Outpatient Follow-up:  1. Follow up with PCP in 1-2 weeks 2. Patient has follow-up appointment scheduled with [name] on on 09/26/19  Home Health: Patient is ambulating without any difficulty Equipment/Devices:   Discharge Condition: stable  CODE STATUS: FULL  Diet recommendation: Heart healthy diet   Discharge Diagnoses:       Acute on chronic systolic CHF (congestive heart failure) (HCC) Chest pain in the setting of uncontrolled hypertension, cocaine use Severe hypokalemia  Cocaine abuse (HCC)  Accelerated hypertension  Moderate protein calorie malnutrition, underweight  Current smoker Acute kidney injury  Consults: Cardiology    Allergies:   Allergies  Allergen Reactions   Ace Inhibitors Swelling    angioedema     DISCHARGE MEDICATIONS: Allergies as of 09/20/2019      Reactions   Ace Inhibitors Swelling   angioedema      Medication List    TAKE these medications   albuterol 108 (90 Base) MCG/ACT inhaler Commonly known as: VENTOLIN HFA Inhale 2 puffs into the lungs every 6 (six) hours as needed for wheezing or shortness of breath.   carvedilol 6.25 MG tablet Commonly known as: COREG Take 1 tablet (6.25 mg total) by mouth 2 (two) times daily with a meal. Please make annual appt for future refills. [phone]. 1st attempt.   furosemide 40 MG tablet Commonly known as: LASIX Take 1 tablet (40 mg total) by mouth daily.   isosorbide mononitrate 60 MG 24 hr tablet Commonly known as: IMDUR Take 1 tablet (60 mg total) by mouth daily. Start taking on: September 21, 2019   losartan 100 MG tablet Commonly known as: COZAAR Take 1 tablet (100 mg total) by mouth daily.        Brief H and P: For complete details please  refer to admission H and P, but in brief Patient is a 59 year old male with history of systolic congestive heart failure, EF 20 to 25% in 2014, improving over time, ongoing cocaine abuse, last use the night before the admission, smoking.  Presented with chest pain and shortness of breath.  Initially presented as possible STEMI however not felt to be STEMI by cardiology. COVID-19 negative   Hospital Course:   Acute on chronic systolic CHF (congestive heart failure) ([place]), accelerated hypertension -In the setting of uncontrolled hypertension, cocaine use, OSA, tobacco use.  Normal cath in 2014 in [place], EF normalized to 55 to 60% on echo in 2016.  EKG showed LVH with strain pattern.  BP on arrival 163/118 -Patient was initially started on nitro drip, IV Lasix diuresis, cardiology was consulted -He has been transitioned to Imdur, Lasix oral 40 mg daily.  Negative balance of 5.6 L at the time of discharge. -Patient has follow-up appointment with [name] -Patient counseled on compliance and cocaine cessation  Chest pain in the setting of cocaine use and uncontrolled hypertension, history of noncompliance, history of nonischemic cardiomyopathy -Chest pain resolved.  Initially placed on IV nitro drip, transitioned to Imdur -ESR low, 2D echo with EF 20 to 25%, severely decreased function global hypokinesis moderate LVH    Cocaine abuse ([name]) -Counseled the patient on cocaine cessation    Current smoker Nicotine patch was placed  Severe hypokalemia Potassium 2.9 on admission Resolved, 4.0 at the time of discharge  Acute kidney injury -Creatinine  1.5 on 9/15, possibly due to uncontrolled hypertension, CHF, cocaine use, diuresis Creatinine 1.3 at the time of discharge   Moderate protein calorie malnutrition, underweight Estimated body mass index is 19.5 kg/m as calculated from the following:   Height as of this encounter: 6' 1.5" (1.867 m).   Weight as of this encounter: 67.9  kg.   Day of Discharge S: Wants to go home, states he is feeling back to his baseline.  No chest pain, no acute shortness of breath, nausea vomiting.  BP (!) 128/98 (BP Location: Right Arm)    Pulse 66    Temp 98.7 F (37.1 C) (Oral)    Resp 18    Ht 6' 1.5" (1.867 m)    Wt 69.6 kg    SpO2 96%    BMI 19.96 kg/m   Physical Exam: General: Alert and awake oriented x3 not in any acute distress. HEENT: anicteric sclera, pupils reactive to light and accommodation CVS: S1-S2 clear no murmur rubs or gallops Chest: clear to auscultation bilaterally, no wheezing rales or rhonchi Abdomen: soft nontender, nondistended, normal bowel sounds Extremities: no cyanosis, clubbing or edema noted bilaterally Neuro: Cranial nerves II-XII intact, no focal neurological deficits    Get Medicines reviewed and adjusted: Please take all your medications with you for your next visit with your Primary MD  Please request your Primary MD to go over all hospital tests and procedure/radiological results at the follow up. Please ask your Primary MD to get all Hospital records sent to his/her office.  If you experience worsening of your admission symptoms, develop shortness of breath, life threatening emergency, suicidal or homicidal thoughts you must seek medical attention immediately by calling 911 or calling your MD immediately  if symptoms less severe.  You must read complete instructions/literature along with all the possible adverse reactions/side effects for all the Medicines you take and that have been prescribed to you. Take any new Medicines after you have completely understood and accept all the possible adverse reactions/side effects.   Do not drive when taking pain medications.   Do not take more than prescribed Pain, Sleep and Anxiety Medications  Special Instructions: If you have smoked or chewed Tobacco  in the last 2 yrs please stop smoking, stop any regular Alcohol  and or any Recreational drug  use.  Wear Seat belts while driving.  Please note  You were cared for by a hospitalist during your hospital stay. Once you are discharged, your primary care physician will handle any further medical issues. Please note that NO REFILLS for any discharge medications will be authorized once you are discharged, as it is imperative that you return to your primary care physician (or establish a relationship with a primary care physician if you do not have one) for your aftercare needs so that they can reassess your need for medications and monitor your lab values.   The results of significant diagnostics from this hospitalization (including imaging, microbiology, ancillary and laboratory) are listed below for reference.      Procedures/Studies:  DG Chest Port 1 View  Result Date: 09/17/2019 CLINICAL DATA:  Chest pain, STEMI EXAM: PORTABLE CHEST 1 VIEW COMPARISON:  03/19/2018 FINDINGS: Mild mid and lower lung zone interstitial pulmonary infiltrate is present most in keeping with interstitial pulmonary edema. There is interval enlargement of the hila bilaterally likely representing central pulmonary arterial enlargement and suggestive of underlying pulmonary arterial hypertension. No pneumothorax or pleural effusion. Cardiac size is at the upper limits of normal. No  acute bone abnormality. IMPRESSION: 1. Mild interstitial pulmonary edema. 2. Interval enlargement of the hila bilaterally likely representing central pulmonary arterial enlargement and suggestive of underlying pulmonary arterial hypertension. 3. Heart size at the upper limits of normal, likely related to low lung volumes. Electronically Signed   By: Fidela Salisbury MD   On: 09/17/2019 17:46   ECHOCARDIOGRAM COMPLETE  Result Date: 09/18/2019    ECHOCARDIOGRAM REPORT   Patient Name:   SHAWAN CORELLA Date of Exam: 09/18/2019 Medical Rec #:  [id number]      Height:       73.0 in Accession #:    [id number]     Weight:       150.0 lb Date of Birth:   1960-02-28       BSA:          1.904 m Patient Age:    39 years       BP:           125/91 mmHg Patient Gender: M              HR:           59 bpm. Exam Location:  Inpatient Procedure: 2D Echo, Cardiac Doppler and Color Doppler Indications:    CHF  History:        Patient has prior history of Echocardiogram examinations, most                 recent 07/02/2014. CHF, COPD, Signs/Symptoms:Shortness of Breath                 and Chest Pain; Risk Factors:Current Smoker, Hypertension and                 Dyslipidemia. Cocaine abuse.  Sonographer:    Dustin Flock Referring Phys: [place]  1. Left ventricular ejection fraction, by estimation, is 20 to 25%. The left ventricle has severely decreased function. The left ventricle demonstrates global hypokinesis. The left ventricular internal cavity size was mildly dilated. There is moderate left ventricular hypertrophy. Left ventricular diastolic parameters are indeterminate.  2. Right ventricular systolic function is normal. The right ventricular size is normal.  3. Left atrial size was mildly dilated.  4. The mitral valve is grossly normal. Trivial mitral valve regurgitation. No evidence of mitral stenosis.  5. The aortic valve is normal in structure. Aortic valve regurgitation is not visualized. No aortic stenosis is present. FINDINGS  Left Ventricle: Left ventricular ejection fraction, by estimation, is 20 to 25%. The left ventricle has severely decreased function. The left ventricle demonstrates global hypokinesis. The left ventricular internal cavity size was mildly dilated. There is moderate left ventricular hypertrophy. Left ventricular diastolic parameters are indeterminate. Right Ventricle: The right ventricular size is normal. No increase in right ventricular wall thickness. Right ventricular systolic function is normal. Left Atrium: Left atrial size was mildly dilated. Right Atrium: Right atrial size was normal in size. Pericardium: There is  no evidence of pericardial effusion. Mitral Valve: The mitral valve is grossly normal. Trivial mitral valve regurgitation. No evidence of mitral valve stenosis. Tricuspid Valve: The tricuspid valve is grossly normal. Tricuspid valve regurgitation is not demonstrated. Aortic Valve: The aortic valve is normal in structure. Aortic valve regurgitation is not visualized. No aortic stenosis is present. Pulmonic Valve: The pulmonic valve was normal in structure. Pulmonic valve regurgitation is not visualized. No evidence of pulmonic stenosis. Aorta: The aortic root and ascending aorta are structurally normal, with no evidence  of dilitation. IAS/Shunts: The atrial septum is grossly normal.  LEFT VENTRICLE PLAX 2D LVIDd:         6.10 cm  Diastology LVIDs:         5.30 cm  LV e' medial:    4.03 cm/s LV PW:         1.40 cm  LV E/e' medial:  8.4 LV IVS:        1.40 cm  LV e' lateral:   6.20 cm/s LVOT diam:     2.60 cm  LV E/e' lateral: 5.5 LV SV:         63 LV SV Index:   33 LVOT Area:     5.31 cm  RIGHT VENTRICLE RV Basal diam:  3.40 cm RV S prime:     12.80 cm/s TAPSE (M-mode): 2.4 cm LEFT ATRIUM             Index       RIGHT ATRIUM           Index LA diam:        4.20 cm 2.21 cm/m  RA Area:     13.80 cm LA Vol (A2C):   77.5 ml 40.70 ml/m RA Volume:   34.30 ml  18.01 ml/m LA Vol (A4C):   65.9 ml 34.61 ml/m LA Biplane Vol: 71.5 ml 37.55 ml/m  AORTIC VALVE LVOT Vmax:   68.10 cm/s LVOT Vmean:  41.700 cm/s LVOT VTI:    0.118 m  AORTA Ao Root diam: 3.40 cm MITRAL VALVE MV Area (PHT): 1.89 cm    SHUNTS MV Decel Time: 401 msec    Systemic VTI:  0.12 m MV E velocity: 34.00 cm/s  Systemic Diam: 2.60 cm MV A velocity: 35.80 cm/s MV E/A ratio:  0.95 Mertie Moores MD Electronically signed by Mertie Moores MD Signature Date/Time: 09/18/2019/2:00:58 PM    Final        LAB RESULTS: Basic Metabolic Panel: Recent Labs  Lab 09/18/19 2047 09/18/19 2047 09/19/19 0635 09/20/19 0526  NA 132*   < > 135 131*  K 4.1   < > 3.7  4.0  CL 101   < > 102 104  CO2 22   < > 24 22  GLUCOSE 94   < > 101* 92  BUN 14   < > 15 20  CREATININE 1.57*   < > 1.25* 1.33*  CALCIUM 8.5*   < > 8.8* 8.6*  MG 1.8  --   --   --    < > = values in this interval not displayed.   Liver Function Tests: Recent Labs  Lab 09/17/19 1710  AST 69*  ALT 54*  ALKPHOS 60  BILITOT 1.5*  PROT 6.8  ALBUMIN 3.8   No results for input(s): LIPASE, AMYLASE in the last 168 hours. No results for input(s): AMMONIA in the last 168 hours. CBC: Recent Labs  Lab 09/17/19 1710  WBC 3.8*  NEUTROABS 1.8  HGB 16.1  HCT 49.9  MCV 98.0  PLT 208   Cardiac Enzymes: No results for input(s): CKTOTAL, CKMB, CKMBINDEX, TROPONINI in the last 168 hours. BNP: Invalid input(s): POCBNP CBG: No results for input(s): GLUCAP in the last 168 hours.     Disposition and Follow-up: Discharge Instructions    (HEART FAILURE PATIENTS) Call MD:  Anytime you have any of the following symptoms: 1) 3 pound weight gain in 24 hours or 5 pounds in 1 week 2) shortness of breath, with or without a  dry hacking cough 3) swelling in the hands, feet or stomach 4) if you have to sleep on extra pillows at night in order to breathe.   Complete by: As directed    Diet - low sodium heart healthy   Complete by: As directed    Increase activity slowly   Complete by: As directed        DISPOSITION: Home*   DISCHARGE FOLLOW-UP  Follow-up Information    Bensimhon, Shaune Pascal, MD Follow up on 09/26/2019.   Specialty: Cardiology Why: at1:40 pm  call the office that morning for instructions on parking Contact information: [place] [address] [phone]        [name], MD. Schedule an appointment as soon as possible for a visit in 2 week(s).   Specialty: Family Medicine Contact information: [place] [address] [phone]                Time coordinating discharge:  35  minutes  Signed:   Estill Cotta M.D. [facility] 09/20/2019, 12:07 PM

## 2019-09-26 ENCOUNTER — Ambulatory Visit (HOSPITAL_COMMUNITY)
Admit: 2019-09-26 | Discharge: 2019-09-26 | Disposition: A | Discharge status: Home / Self Care | Payer: Medicaid Other | Attending: Internal Medicine | Admitting: Internal Medicine

## 2019-09-26 ENCOUNTER — Other Ambulatory Visit: Payer: Self-pay

## 2019-09-26 ENCOUNTER — Encounter (HOSPITAL_COMMUNITY): Payer: Self-pay | Admitting: Internal Medicine

## 2019-09-26 VITALS — BP 160/84 | HR 84 | Ht 73.5 in | Wt 150.2 lb

## 2019-09-26 DIAGNOSIS — I110 Hypertensive heart disease with heart failure: Secondary | ICD-10-CM | POA: Insufficient documentation

## 2019-09-26 DIAGNOSIS — I10 Essential (primary) hypertension: Secondary | ICD-10-CM

## 2019-09-26 DIAGNOSIS — F1410 Cocaine abuse, uncomplicated: Secondary | ICD-10-CM | POA: Insufficient documentation

## 2019-09-26 DIAGNOSIS — Z79899 Other long term (current) drug therapy: Secondary | ICD-10-CM | POA: Diagnosis not present

## 2019-09-26 DIAGNOSIS — F1910 Other psychoactive substance abuse, uncomplicated: Secondary | ICD-10-CM | POA: Diagnosis not present

## 2019-09-26 DIAGNOSIS — F17210 Nicotine dependence, cigarettes, uncomplicated: Secondary | ICD-10-CM | POA: Diagnosis not present

## 2019-09-26 DIAGNOSIS — I5022 Chronic systolic (congestive) heart failure: Secondary | ICD-10-CM

## 2019-09-26 DIAGNOSIS — E785 Hyperlipidemia, unspecified: Secondary | ICD-10-CM | POA: Insufficient documentation

## 2019-09-26 DIAGNOSIS — J449 Chronic obstructive pulmonary disease, unspecified: Secondary | ICD-10-CM | POA: Diagnosis not present

## 2019-09-26 DIAGNOSIS — G4733 Obstructive sleep apnea (adult) (pediatric): Secondary | ICD-10-CM | POA: Diagnosis not present

## 2019-09-26 DIAGNOSIS — I428 Other cardiomyopathies: Secondary | ICD-10-CM | POA: Diagnosis not present

## 2019-09-26 LAB — BASIC METABOLIC PANEL
Anion gap: 9 (ref 5–15)
BUN: 19 mg/dL (ref 6–20)
CO2: 21 mmol/L — ABNORMAL LOW (ref 22–32)
Calcium: 8.9 mg/dL (ref 8.9–10.3)
Chloride: 108 mmol/L (ref 98–111)
Creatinine, Ser: 1.21 mg/dL (ref 0.61–1.24)
GFR calc Af Amer: >60 mL/min (ref >60)
GFR calc non Af Amer: >60 mL/min (ref >60)
Glucose, Bld: 87 mg/dL (ref 70–99)
Potassium: 4.3 mmol/L (ref 3.5–5.1)
Sodium: 138 mmol/L (ref 135–145)

## 2019-09-26 LAB — BRAIN NATRIURETIC PEPTIDE: B Natriuretic Peptide: 331.4 pg/mL — ABNORMAL HIGH (ref 0.0–100.0)

## 2019-09-26 MED ORDER — CARVEDILOL 6.25 MG PO TABS
6.2500 mg | ORAL_TABLET | Freq: Two times a day (BID) | ORAL | 6 refills | Status: DC
Start: 2019-09-26 — End: 2020-01-01

## 2019-09-26 MED ORDER — LOSARTAN POTASSIUM 100 MG PO TABS: 100.0000 mg | ORAL_TABLET | Freq: Every day | ORAL | 3 refills | Status: DC

## 2019-09-26 MED ORDER — SPIRONOLACTONE 25 MG PO TABS: 12.5000 mg | ORAL_TABLET | Freq: Every day | ORAL | 3 refills | Status: DC

## 2019-09-26 NOTE — Progress Notes (Signed)
ADVANCED HF CLINIC CONSULT NOTE  Patient ID: Charles Cervantes, male   DOB: 09-01-1960, 59 y.o.   MRN: [id number]  PCP: [facility]  HPI: Mr Junker is a 59 yo male with systolic heart failure diagnosed in 04/2012 in [place] secondary to NICM (normal cors on cath), EF 20-25%. As well as polysubstance abuse (cocaine, tobacco and alcohol). I last saw him in the HF Clinic in 2016. He is now referred back by Roby Lofts NP for further evaluation of his HF.   He moved from [facility] and was admitted to [facility] in 4/14 with progressive dyspnea and orthopnea.  Echo on admit EF 20-25% with normal RV.  Discharge weight 147 pounds.  He also had NSVT while in house and we continued his Lifevest placed in [place].  Admitted To [facility] in 8/14 with HF. EF reported 50-55% on echo. On 10/2012 bedside echo in our HF Clinic EF 35-40%. In 11/2012 presented to clinic c/o recurrent CP and severe HF symptoms but seemed well compensated on exam R/L cath showed normal cors.   Readmitted to [facility] 9/14-17/21 with severe HTN and recurrent CP anfd HF. Reported he was taking his HF meds except his fluid pill. BP on arrival 163/118. Diuresed and BP controlled.   UDS + for cocaine, THC and opiates. Echo 09/18/19 EF 20-25% RV mildly HK Personally reviewed  Here for post hospital f/u to enroll back in HF Clinic (previously discharged due to inappropriate behavior towards staff). Says he feels pretty good. Lives in a rental house by himself. SOB with mild activity. No edema, orthopnea or PND. Says he is taking all meds except carvedilol because he threw it out by accident. Using cocaine 2-3 weeks. Dinks 12 pack every 2 days.   ECHO  04/2012 EF 20-25% 07/17/12 EF 30-35% 08/2012 EF 50-55% at [facility] ECHO 02/27/13 EF 35-40%  RHC/LHC  11/20/12  RA = 6  RV = 33/3/8  PA = 29/9 (18)  PCW = 8  Fick cardiac output/index = 6.4/3.2  PVR = 1.5 WU  FA sat = 95%  PA sat = 72%, 73%  SVC sat 72% Extensive calcification in proximal and mid LAD  with only mild intraluminal stenosis with 20-30% stenosis.    CPX 2/15 FVC 2.90 (62%)  FEV1 2.22 (60%) FEV1/FVC 76%  Resting HR: 61 Peak HR: 111 (66% age predicted max HR) BP rest: 146/90 BP peak: 168/86 Peak VO2: 22.6 (64.1% predicted peak VO2) VE/VCO2 slope: 29.4 OUES: 2.49 Peak RER: 0.90  Labs 06/29/12 Potassium 5.4 Creatinine 1.05 07/17/12 Dig Level 0.5 Potassium 4.0 Creatinine 0.95 Pro BNP 244 11/20/12 K 4.0 Creatinine 1.18 12/18/12: K 4.7, Cr 0.96 05/16/13 K 4.5 Creatinine 0.91  ROS: All systems negative except as listed in HPI, PMH and Problem List.  Past Medical History:  Diagnosis Date  . CHF (congestive heart failure) (HCC)    takes Furosemide daily  . Chronic back pain   . Constipation    takes Colace daily  . COPD (chronic obstructive pulmonary disease) (HCC)    Spiriva daily  . Depression    takes Effexor daily and Risperdal nightly  . Dizziness   . H/O blood clots 2012   in leg  . Headache    occasionally  . Hyperlipidemia   . Hypertension    takes Losartan and Coreg daily  . Muscle spasm    takes Zizanidine daily as needed  . Shortness of breath dyspnea    more with exertion but sometimes notices with  lying/sitting  . Sleep apnea   . Weakness    tingling and numbness    Current Outpatient Medications  Medication Sig Dispense Refill  . albuterol (PROVENTIL HFA;VENTOLIN HFA) 108 (90 BASE) MCG/ACT inhaler Inhale 2 puffs into the lungs every 6 (six) hours as needed for wheezing or shortness of breath. 1 Inhaler 2  . furosemide (LASIX) 40 MG tablet Take 1 tablet (40 mg total) by mouth daily. 90 tablet 2  . isosorbide mononitrate (IMDUR) 60 MG 24 hr tablet Take 1 tablet (60 mg total) by mouth daily. 90 tablet 1  . losartan (COZAAR) 100 MG tablet Take 1 tablet (100 mg total) by mouth daily. 90 tablet 1  . carvedilol (COREG) 6.25 MG tablet Take 1 tablet (6.25 mg total) by mouth 2 (two) times daily with a meal. Please make annual appt for future refills.  [phone]. 1st attempt. (Patient not taking: Reported on 09/26/2019) 30 tablet 1   No current facility-administered medications for this encounter.   Social History   Tobacco Use  . Smoking status: Current Some Day Smoker    Packs/day: 0.25    Years: 20.00    Pack years: 5.00    Types: Cigarettes  . Smokeless tobacco: Never Used  . Tobacco comment: 1-2 cigs per day (08/15/14)  Vaping Use  . Vaping Use: Never used  Substance Use Topics  . Alcohol use: No    Alcohol/week: 0.0 standard drinks    Comment: stopped in april 2014  . Drug use: Not Currently    Types: Cocaine   FHx: Mom died (53s) in MVA when he was 60 months old Dad murdered (72yo) when he was he was in 7th grade  No FHx of HF  Vitals:   09/26/19 1339  BP: (!) 160/84  Pulse: 84  SpO2: 97%  Weight: 68.1 kg (150 lb 3.2 oz)  Height: 6' 1.5" (1.867 m)   PHYSICAL EXAM: General:  Well appearing. No resp difficulty HEENT: normal Neck: supple. JVP 5-6. Carotids 2+ bilaterally; no bruits. No lymphadenopathy or thryomegaly appreciated. Cor: PMI normal. Regular rate & rhythm. No rubs, or murmurs.  Lungs: clear Abdomen: soft, nontender, nondistention. No hepatosplenomegaly. No bruits or masses. Good bowel sounds. Extremities: no cyanosis, clubbing, rash, edema Neuro: alert & orientedx3, cranial nerves grossly intact. Moves all 4 extremities w/o difficulty. Affect pleasant.   ASSESSMENT & PLAN:  1) Chronic systolic HF:  - NICM, ECHO 02/2013 EF ~35-40% - NYHA II symptoms.  - cath 11/14 normal cors - Echo 09/18/19 EF 20-25% RV mildly HK in setting of severe HTN - Volume status ok - Resume carvedilol 6.25 mg twice a day - Continue losartan 100 daily (no Entresto due to angiodema with ACEi) - Start spiro 12.5 - Stressed need for compliance with meds and abstinence from drugs and ETOH 2) Polysubstance abuse -  Stressed need for compliance with meds and abstinence from drugs and ETOH = Previously was attending [place]  meetings and group sessions weekly. 3) Current smoker - encouraged cessation  4) HTN - markedly elevated recently - adding spiro today - will have him f/u in Pharm[facility] clinic for med titration. Add hydralazine soon 5) OSA- Per DrAlva. Not using CPAP. Encouraged to use nightly.  6) Clinic behavior - Previously d/c'd from [facility] as several staff members did not feel comfortable in his presence. I discussed with with him openly today and said he would get one more chance with a very low tolerance. If he is disrespectful to any staff  members will be dismissed from clinic on the spot.   Total time spent 45 minutes. Over half that time spent discussing above.   Glori Bickers, MD  1:58 PM

## 2019-09-26 NOTE — Patient Instructions (Signed)
Restart Carvedilol 6.25 mg Twice daily   Start Spironolactone 12.5 mg (1/2 tab) Daily  Labs done today, we will call you for abnormal results  Please follow up with our heart failure pharmacist in 3 weeks  Your physician recommends that you schedule a follow-up appointment in: 3-4 months  If you have any questions or concerns before your next appointment please send [place] a message through [place] or call our office at [phone].    TO LEAVE A MESSAGE FOR THE NURSE SELECT OPTION 2, PLEASE LEAVE A MESSAGE INCLUDING:  YOUR NAME  DATE OF BIRTH  CALL BACK NUMBER  REASON FOR CALL**this is important as we prioritize the call backs  YOU WILL RECEIVE A CALL BACK THE SAME DAY AS LONG AS YOU CALL BEFORE 4:00 PM  At the [facility], you and your health needs are our priority. As part of our continuing mission to provide you with exceptional heart care, we have created designated Provider Care Teams. These Care Teams include your primary Cardiologist (physician) and Advanced Practice Providers (APPs- Physician Assistants and Nurse Practitioners) who all work together to provide you with the care you need, when you need it.   You may see any of the following providers on your designated Care Team at your next follow up:  Dr Glori Bickers  Dr Haynes Kerns, NP  Lyda Jester, [place]  Audry Riles, PharmD   Please be sure to bring in all your medications bottles to every appointment.

## 2019-10-08 ENCOUNTER — Inpatient Hospital Stay (HOSPITAL_COMMUNITY)
Admission: RE | Admit: 2019-10-08 | Discharge: 2019-10-08 | Disposition: A | Discharge status: Home / Self Care | Payer: Medicaid Other | Source: Ambulatory Visit

## 2019-10-22 ENCOUNTER — Inpatient Hospital Stay (HOSPITAL_COMMUNITY): Admission: RE | Admit: 2019-10-22 | Payer: Medicaid Other | Source: Ambulatory Visit

## 2019-11-05 ENCOUNTER — Inpatient Hospital Stay (HOSPITAL_COMMUNITY): Admission: RE | Admit: 2019-11-05 | Payer: Medicaid Other | Source: Ambulatory Visit

## 2019-12-31 ENCOUNTER — Encounter: Payer: Self-pay | Admitting: Internal Medicine

## 2019-12-31 NOTE — Telephone Encounter (Signed)
Error

## 2020-01-01 ENCOUNTER — Other Ambulatory Visit (HOSPITAL_COMMUNITY): Payer: Self-pay

## 2020-01-01 MED ORDER — CARVEDILOL 6.25 MG PO TABS
6.2500 mg | ORAL_TABLET | Freq: Two times a day (BID) | ORAL | 1 refills | Status: DC
Start: 2020-01-01 — End: 2020-03-03

## 2020-01-01 MED ORDER — ISOSORBIDE MONONITRATE ER 60 MG PO TB24: 60.0000 mg | ORAL_TABLET | Freq: Every day | ORAL | 1 refills | Status: DC

## 2020-01-01 MED ORDER — SPIRONOLACTONE 25 MG PO TABS
12.5000 mg | ORAL_TABLET | Freq: Every day | ORAL | 1 refills | Status: DC
Start: 2020-01-01 — End: 2020-03-13

## 2020-01-01 MED ORDER — FUROSEMIDE 40 MG PO TABS: 40.0000 mg | ORAL_TABLET | Freq: Every day | ORAL | 1 refills | Status: DC

## 2020-01-01 MED ORDER — LOSARTAN POTASSIUM 100 MG PO TABS: 100.0000 mg | ORAL_TABLET | Freq: Every day | ORAL | 1 refills | Status: DC

## 2020-01-07 NOTE — Progress Notes (Signed)
Patient did not show for appt. Note left only for templating purposes       ADVANCED HF CLINIC NOTE  Patient ID: Charles Cervantes, male   DOB: [birth year], 60 y.o.   MRN: [id number]  PCP: [facility].  HPI: Charles Cervantes is a 60 yo male with systolic heart failure diagnosed in 04/2012 in [place] secondary to NICM (normal cors on cath), EF 20-25%. As well as polysubstance abuse (cocaine, tobacco and alcohol)  He moved from [place] [facility] and was admitted to [facility] in 4/14 with progressive dyspnea and orthopnea.  Echo on admit EF 20-25% with normal RV.  Discharge weight 147 pounds.  He also had NSVT while in house and we continued his Lifevest placed in [place].  Admitted To [facility] in 8/14 with HF. EF reported 50-55% on echo. On 10/2012 bedside echo in our HF Clinic EF 35-40%. In 11/2012 presented to clinic c/o recurrent CP and severe HF symptoms but seemed well compensated on exam R/L cath showed normal cors.   He was lost to f/u in 2016.   Readmitted to [facility] 9/14-17/21 with severe HTN and recurrent CP anfd HF. Reported he was taking his HF meds except his fluid pill. BP on arrival 163/118. Diuresed and BP controlled.   UDS + for cocaine, THC and opiates. Echo 09/18/19 EF 20-25% RV mildly HK Personally reviewed  I saw him for post Cervantes f/u on 09/26/19 to enroll back in HF Clinic (previously discharged due to inappropriate behavior towards staff). I resumed carvedilol (he threw it out by mistake) and started spiro 12.5    Says he feels pretty good. Lives in a rental house by himself. SOB with mild activity. No edema, orthopnea or PND. Says he is taking all meds except carvedilol because he threw it out by accident. Using cocaine 2-3 weeks. Dinks 12 pack every 2 days.   ECHO  04/2012 EF 20-25% 07/17/12 EF 30-35% 08/2012 EF 50-55% at [facility] ECHO 02/27/13 EF 35-40%  RHC/LHC  11/20/12  RA = 6  RV = 33/3/8  PA = 29/9 (18)  PCW = 8  Fick cardiac output/index = 6.4/3.2  PVR = 1.5 WU  FA sat = 95%   PA sat = 72%, 73%  SVC sat 72% Extensive calcification in proximal and mid LAD with only mild intraluminal stenosis with 20-30% stenosis.    CPX 2/15 FVC 2.90 (62%)  FEV1 2.22 (60%) FEV1/FVC 76%  Resting HR: 61 Peak HR: 111 (66% age predicted max HR) BP rest: 146/90 BP peak: 168/86 Peak VO2: 22.6 (64.1% predicted peak VO2) VE/VCO2 slope: 29.4 OUES: 2.49 Peak RER: 0.90  Labs 06/29/12 Potassium 5.4 Creatinine 1.05 07/17/12 Dig Level 0.5 Potassium 4.0 Creatinine 0.95 Pro BNP 244 11/20/12 K 4.0 Creatinine 1.18 12/18/12: K 4.7, Cr 0.96 05/16/13 K 4.5 Creatinine 0.91  ROS: All systems negative except as listed in HPI, PMH and Problem List.  Past Medical History:  Diagnosis Date   CHF (congestive heart failure) (HCC)    takes Furosemide daily   Chronic back pain    Constipation    takes Colace daily   COPD (chronic obstructive pulmonary disease) (HCC)    Spiriva daily   Depression    takes Effexor daily and Risperdal nightly   Dizziness    H/O blood clots 2012   in leg   Headache    occasionally   Hyperlipidemia    Hypertension    takes Losartan and Coreg daily   Muscle spasm  takes Zizanidine daily as needed   Shortness of breath dyspnea    more with exertion but sometimes notices with lying/sitting   Sleep apnea    Weakness    tingling and numbness    Current Outpatient Medications  Medication Sig Dispense Refill   albuterol (PROVENTIL HFA;VENTOLIN HFA) 108 (90 BASE) MCG/ACT inhaler Inhale 2 puffs into the lungs every 6 (six) hours as needed for wheezing or shortness of breath. 1 Inhaler 2   carvedilol (COREG) 6.25 MG tablet Take 1 tablet (6.25 mg total) by mouth 2 (two) times daily with a meal. 180 tablet 1   furosemide (LASIX) 40 MG tablet Take 1 tablet (40 mg total) by mouth daily. 90 tablet 1   isosorbide mononitrate (IMDUR) 60 MG 24 hr tablet Take 1 tablet (60 mg total) by mouth daily. 90 tablet 1   losartan (COZAAR) 100 MG tablet Take 1 tablet (100 mg  total) by mouth daily. 90 tablet 1   spironolactone (ALDACTONE) 25 MG tablet Take 0.5 tablets (12.5 mg total) by mouth daily. 45 tablet 1   No current facility-administered medications for this encounter.   Social History   Tobacco Use   Smoking status: Current Some Day Smoker    Packs/day: 0.25    Years: 20.00    Pack years: 5.00    Types: Cigarettes   Smokeless tobacco: Never Used   Tobacco comment: 1-2 cigs per day (08/15/14)  Vaping Use   Vaping Use: Never used  Substance Use Topics   Alcohol use: No    Alcohol/week: 0.0 standard drinks    Comment: stopped in april 2014   Drug use: Not Currently    Types: Cocaine   FHx: Mom died (27s) in MVA when he was 95 months old Dad murdered (28yo) when he was he was in 7th grade  No FHx of HF  There were no vitals filed for this visit. PHYSICAL EXAM: General:  Well appearing. No resp difficulty HEENT: normal Neck: supple. JVP 5-6. Carotids 2+ bilaterally; no bruits. No lymphadenopathy or thryomegaly appreciated. Cor: PMI normal. Regular rate & rhythm. No rubs, or murmurs.  Lungs: clear Abdomen: soft, nontender, nondistention. No hepatosplenomegaly. No bruits or masses. Good bowel sounds. Extremities: no cyanosis, clubbing, rash, edema Neuro: alert & orientedx3, cranial nerves grossly intact. Moves all 4 extremities w/o difficulty. Affect pleasant.   ASSESSMENT & PLAN:  1) Chronic systolic HF:  - NICM, ECHO 02/2013 EF ~35-40% - NYHA II symptoms.  - cath 11/14 normal cors - Echo 09/18/19 EF 20-25% RV mildly HK in setting of severe HTN - Volume status ok - Resume carvedilol 6.25 mg twice a day - Continue losartan 100 daily (no Entresto due to angiodema with ACEi) - Start spiro 12.5 - Stressed need for compliance with meds and abstinence from drugs and ETOH 2) Polysubstance abuse -  Stressed need for compliance with meds and abstinence from drugs and ETOH = Previously was attending [facility] and group sessions weekly. 3)  Current smoker - encouraged cessation  4) HTN - markedly elevated recently - adding spiro today - will have him f/u in PharmD clinic for med titration. Add hydralazine soon 5) OSA- Per DrAlva. Not using CPAP. Encouraged to use nightly.  6) Clinic behavior - Previously d/c'd from [facility] as several staff members did not feel comfortable in his presence. I discussed with with him openly today and said he would get one more chance with a very low tolerance. If he is disrespectful to any  staff members will be dismissed from clinic on the spot.   Total time spent 45 minutes. Over half that time spent discussing above.   Glori Bickers, MD  9:15 PM

## 2020-01-08 ENCOUNTER — Inpatient Hospital Stay (HOSPITAL_COMMUNITY)
Admission: RE | Admit: 2020-01-08 | Discharge: 2020-01-08 | Disposition: A | Discharge status: Home / Self Care | Payer: Medicaid Other | Source: Ambulatory Visit | Attending: Internal Medicine | Admitting: Internal Medicine

## 2020-02-07 IMAGING — DX DG RIBS W/ CHEST 3+V*R*
5 series · 5 of 5 positions shown · non-contrast
Comparison: Chest radiographs 11/08/2015

CLINICAL DATA: Anterior and posterior right-sided chest pain.

EXAM:
RIGHT RIBS AND CHEST - 3+ VIEW

[chest pa]
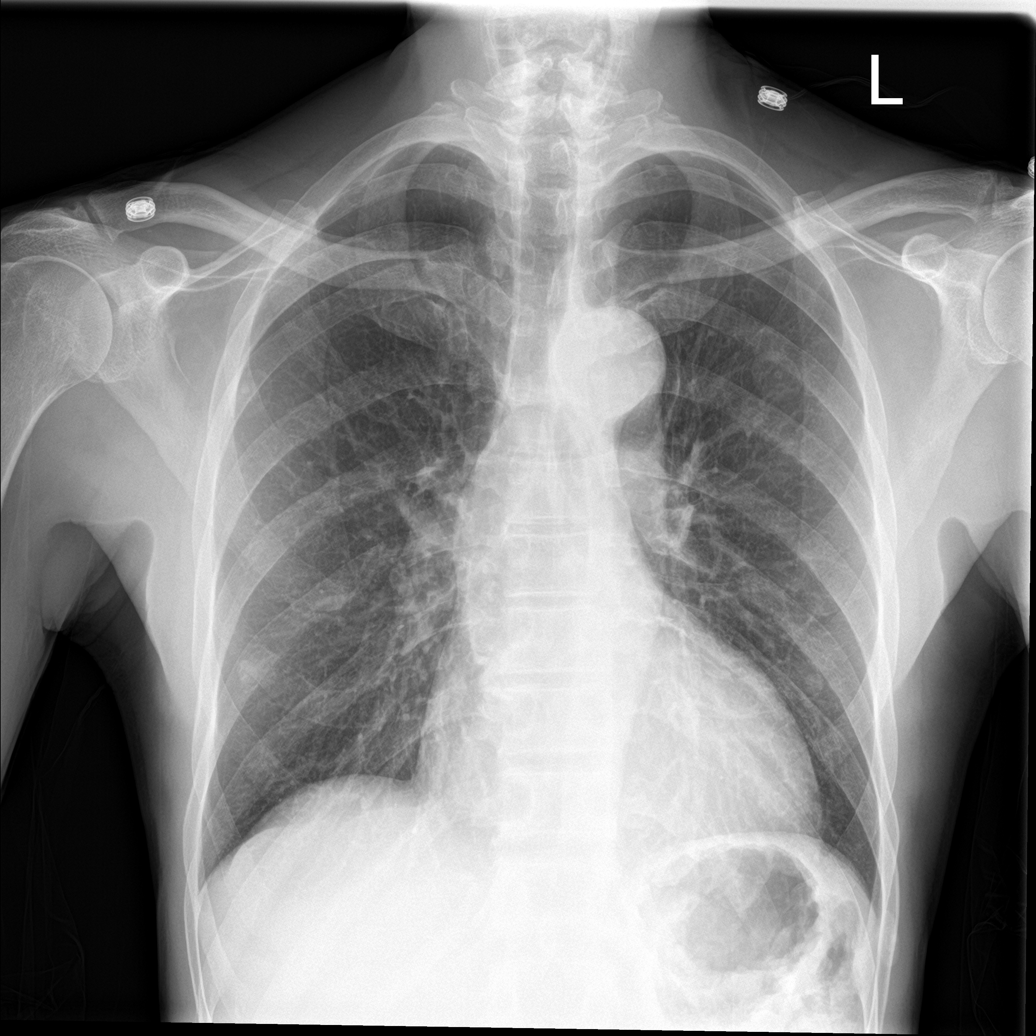

[rib pa (1 of 2)]
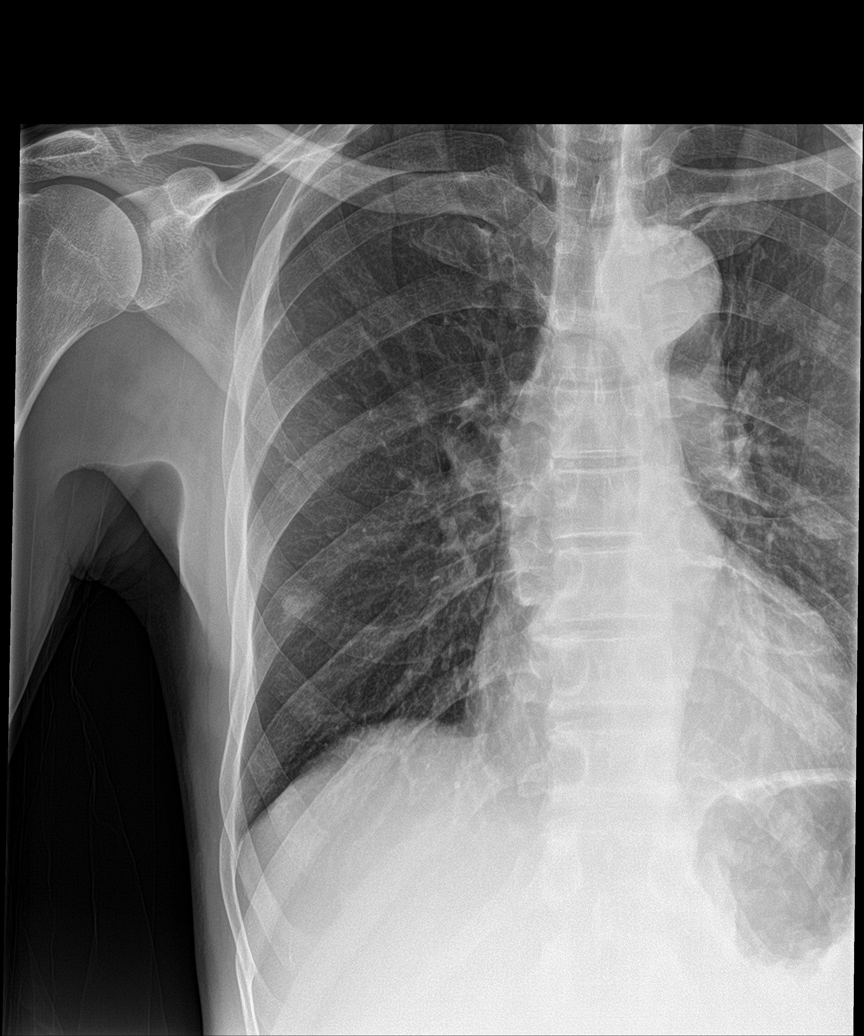

[rib pa obl (1 of 2)]
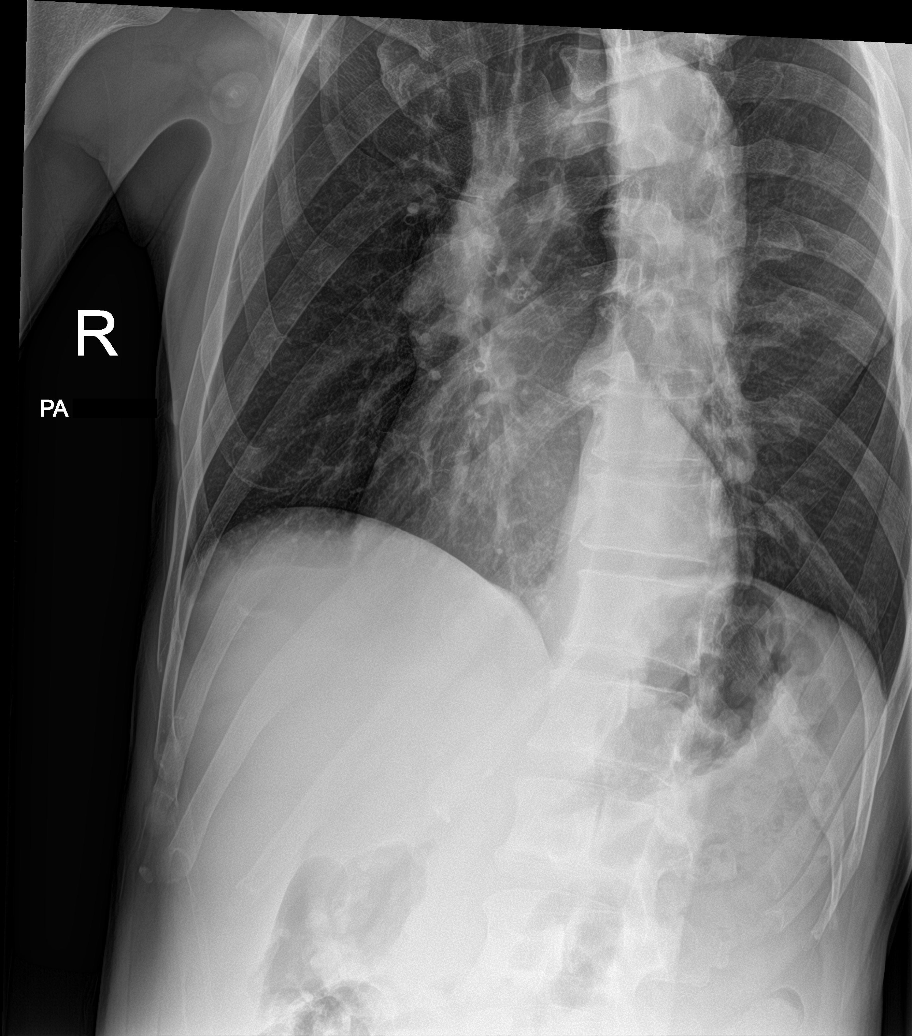

[rib pa obl (2 of 2)]
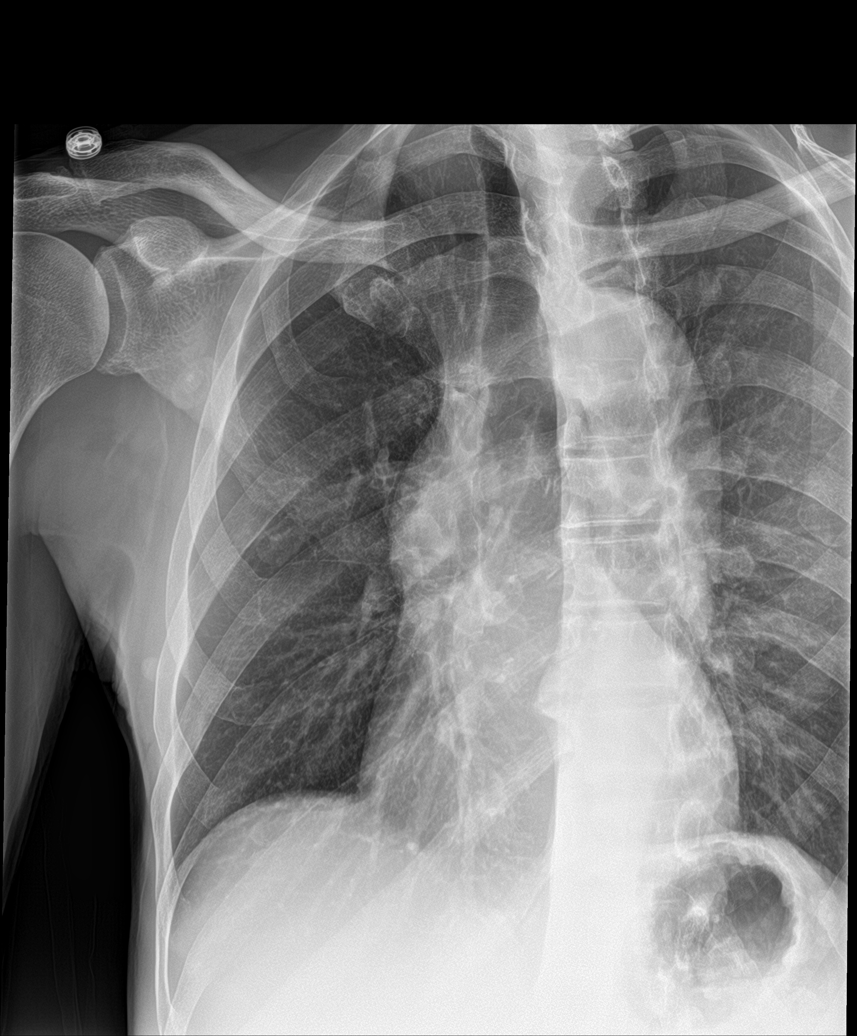

[rib pa (2 of 2)]
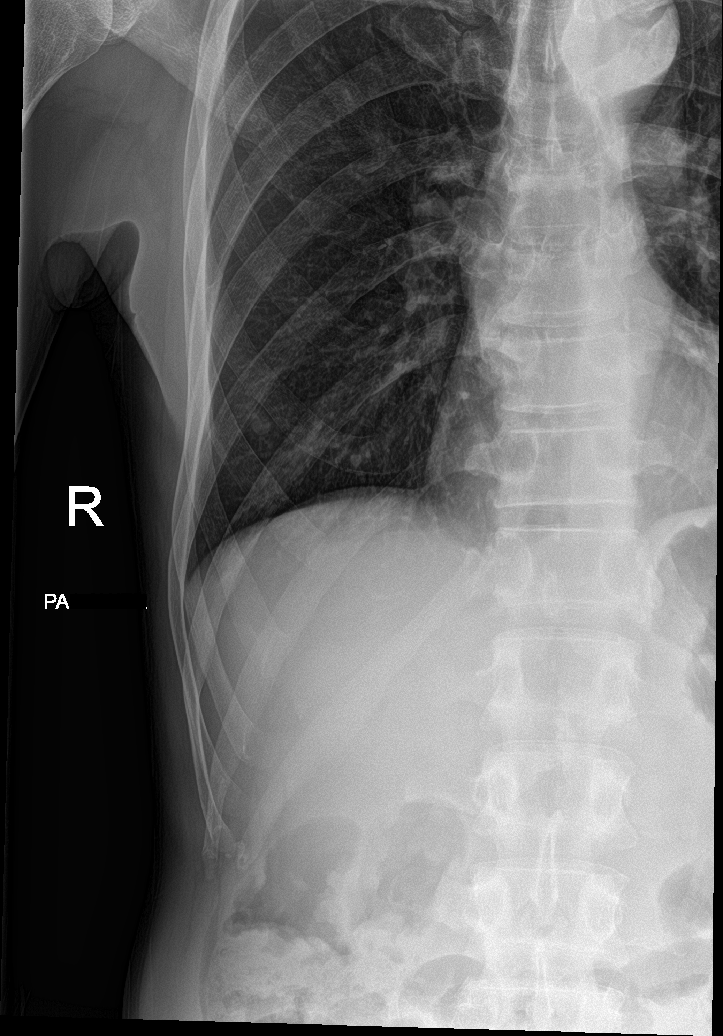

[5 of 5 positions shown; findings below may reference images not displayed]

FINDINGS: The cardiac silhouette is mildly enlarged. Aortic atherosclerosis is
noted. Slight prominence of the interstitial markings is unchanged.
No overt edema, airspace consolidation, pleural effusion, or
pneumothorax is identified. Bilateral nipple shadows are again
noted. Asymmetric narrowing of the interspace between the posterior
right fourth and fifth ribs is chronic. No rib fracture is
identified.
IMPRESSION: No rib fracture identified.

## 2020-03-03 ENCOUNTER — Other Ambulatory Visit (HOSPITAL_COMMUNITY): Payer: Self-pay | Admitting: *Deleted

## 2020-03-03 MED ORDER — CARVEDILOL 6.25 MG PO TABS
6.2500 mg | ORAL_TABLET | Freq: Two times a day (BID) | ORAL | 1 refills | Status: DC
Start: 2020-03-03 — End: 2020-03-13

## 2020-03-13 ENCOUNTER — Other Ambulatory Visit (HOSPITAL_COMMUNITY): Payer: Self-pay | Admitting: *Deleted

## 2020-03-13 MED ORDER — SPIRONOLACTONE 25 MG PO TABS
12.5000 mg | ORAL_TABLET | Freq: Every day | ORAL | 1 refills | Status: DC
Start: 2020-03-13 — End: 2020-06-11

## 2020-03-13 MED ORDER — LOSARTAN POTASSIUM 100 MG PO TABS
100.0000 mg | ORAL_TABLET | Freq: Every day | ORAL | 1 refills | Status: DC
Start: 2020-03-13 — End: 2020-06-30

## 2020-03-13 MED ORDER — CARVEDILOL 6.25 MG PO TABS
6.2500 mg | ORAL_TABLET | Freq: Two times a day (BID) | ORAL | 1 refills | Status: DC
Start: 2020-03-13 — End: 2020-06-30

## 2020-03-13 MED ORDER — ISOSORBIDE MONONITRATE ER 60 MG PO TB24
60.0000 mg | ORAL_TABLET | Freq: Every day | ORAL | 1 refills | Status: DC
Start: 2020-03-13 — End: 2020-06-11

## 2020-03-13 MED ORDER — FUROSEMIDE 40 MG PO TABS
40.0000 mg | ORAL_TABLET | Freq: Every day | ORAL | 1 refills | Status: DC
Start: 2020-03-13 — End: 2020-06-30

## 2020-03-23 ENCOUNTER — Telehealth (HOSPITAL_COMMUNITY): Payer: Self-pay | Admitting: *Deleted

## 2020-03-23 NOTE — Telephone Encounter (Signed)
Pt left vm requesting return call about medication. I called pt back no answer/left vm for pt to return my call.

## 2020-04-15 ENCOUNTER — Encounter (HOSPITAL_COMMUNITY): Payer: Self-pay | Admitting: Internal Medicine

## 2020-04-15 ENCOUNTER — Other Ambulatory Visit: Payer: Self-pay

## 2020-04-15 ENCOUNTER — Ambulatory Visit (HOSPITAL_COMMUNITY)
Admission: RE | Admit: 2020-04-15 | Discharge: 2020-04-15 | Disposition: A | Discharge status: Home / Self Care | Payer: Medicaid Other | Source: Ambulatory Visit | Attending: Internal Medicine | Admitting: Internal Medicine

## 2020-04-15 VITALS — BP 118/80 | HR 64 | Wt 191.2 lb

## 2020-04-15 DIAGNOSIS — F1410 Cocaine abuse, uncomplicated: Secondary | ICD-10-CM | POA: Diagnosis not present

## 2020-04-15 DIAGNOSIS — Z79899 Other long term (current) drug therapy: Secondary | ICD-10-CM | POA: Diagnosis not present

## 2020-04-15 DIAGNOSIS — Z733 Stress, not elsewhere classified: Secondary | ICD-10-CM | POA: Insufficient documentation

## 2020-04-15 DIAGNOSIS — I10 Essential (primary) hypertension: Secondary | ICD-10-CM | POA: Diagnosis not present

## 2020-04-15 DIAGNOSIS — F17210 Nicotine dependence, cigarettes, uncomplicated: Secondary | ICD-10-CM | POA: Insufficient documentation

## 2020-04-15 DIAGNOSIS — Z9119 Patient's noncompliance with other medical treatment and regimen: Secondary | ICD-10-CM | POA: Insufficient documentation

## 2020-04-15 DIAGNOSIS — I428 Other cardiomyopathies: Secondary | ICD-10-CM | POA: Insufficient documentation

## 2020-04-15 DIAGNOSIS — G4733 Obstructive sleep apnea (adult) (pediatric): Secondary | ICD-10-CM | POA: Diagnosis not present

## 2020-04-15 DIAGNOSIS — I5022 Chronic systolic (congestive) heart failure: Secondary | ICD-10-CM | POA: Diagnosis not present

## 2020-04-15 DIAGNOSIS — I110 Hypertensive heart disease with heart failure: Secondary | ICD-10-CM | POA: Insufficient documentation

## 2020-04-15 DIAGNOSIS — Z658 Other specified problems related to psychosocial circumstances: Secondary | ICD-10-CM | POA: Diagnosis not present

## 2020-04-15 LAB — BASIC METABOLIC PANEL
Anion gap: 7 (ref 5–15)
BUN: 14 mg/dL (ref 6–20)
CO2: 27 mmol/L (ref 22–32)
Calcium: 9.8 mg/dL (ref 8.9–10.3)
Chloride: 102 mmol/L (ref 98–111)
Creatinine, Ser: 1.25 mg/dL — ABNORMAL HIGH (ref 0.61–1.24)
GFR, Estimated: >60 mL/min (ref >60)
Glucose, Bld: 124 mg/dL — ABNORMAL HIGH (ref 70–99)
Potassium: 4.8 mmol/L (ref 3.5–5.1)
Sodium: 136 mmol/L (ref 135–145)

## 2020-04-15 LAB — BRAIN NATRIURETIC PEPTIDE: B Natriuretic Peptide: 31 pg/mL (ref 0.0–100.0)

## 2020-04-15 MED ORDER — DAPAGLIFLOZIN PROPANEDIOL 10 MG PO TABS: 10.0000 mg | ORAL_TABLET | Freq: Every day | ORAL | 6 refills | Status: DC

## 2020-04-15 NOTE — Progress Notes (Signed)
ADVANCED HF CLINIC NOTE  Patient ID: Charles Cervantes, male   DOB: [birth year], 60 y.o.   MRN: [id number]  PCP: [facility].  HPI:  Charles Cervantes is a 60 yo male with systolic heart failure diagnosed in 04/2012 in [place] secondary to NICM (normal cors on cath), EF 20-25%. As well as polysubstance abuse (cocaine, tobacco and alcohol)  He moved from [facility] and was admitted to [facility] in 4/14 with progressive dyspnea and orthopnea.  Echo on admit EF 20-25% with normal RV.  Discharge weight 147 pounds.  He also had NSVT while in house and we continued his Lifevest placed in [place].  Admitted To [facility] in 8/14 with HF. EF reported 50-55% on echo. On 10/2012 bedside echo in our HF Clinic EF 35-40%. In 11/2012 presented to clinic c/o recurrent CP and severe HF symptoms but seemed well compensated on exam R/L cath showed normal cors.   He was lost to f/u in 2016.   Readmitted to [facility] 9/14-17/21 with severe HTN and recurrent CP anfd HF. Reported he was taking his HF meds except his fluid pill. BP on arrival 163/118. Diuresed and BP controlled.   UDS + for cocaine, THC and opiates.   Echo 09/18/19 EF 20-25% RV mildly HK Personally reviewed  I saw him for post hospital f/u on 09/26/19 to enroll back in HF Clinic (previously discharged due to inappropriate behavior towards staff).  Since we last saw him has been in two Treatment Centers in [place] at [facility] 2/9-3/9. Then relapsed and went to [facility] 3/21-current (gets out Tuesday). No substances since then. Has gained 40 pounds. SOB with mild activity and bending over + mild orthopnea No CP. Says he needs something to relax.     ECHO  04/2012 EF 20-25% 07/17/12 EF 30-35% 08/2012 EF 50-55% at [facility] ECHO 02/27/13 EF 35-40%  RHC/LHC  11/20/12  RA = 6  RV = 33/3/8  PA = 29/9 (18)  PCW = 8  Fick cardiac output/index = 6.4/3.2  PVR = 1.5 WU  FA sat = 95%  PA sat = 72%, 73%  SVC sat 72% Extensive calcification in proximal and mid LAD with  only mild intraluminal stenosis with 20-30% stenosis.    CPX 2/15 FVC 2.90 (62%)  FEV1 2.22 (60%) FEV1/FVC 76%  Resting HR: 61 Peak HR: 111 (66% age predicted max HR) BP rest: 146/90 BP peak: 168/86 Peak VO2: 22.6 (64.1% predicted peak VO2) VE/VCO2 slope: 29.4 OUES: 2.49 Peak RER: 0.90  Labs 06/29/12 Potassium 5.4 Creatinine 1.05 07/17/12 Dig Level 0.5 Potassium 4.0 Creatinine 0.95 Pro BNP 244 11/20/12 K 4.0 Creatinine 1.18 12/18/12: K 4.7, Cr 0.96 05/16/13 K 4.5 Creatinine 0.91  ROS: All systems negative except as listed in HPI, PMH and Problem List.  Past Medical History:  Diagnosis Date  . CHF (congestive heart failure) (HCC)    takes Furosemide daily  . Chronic back pain   . Constipation    takes Colace daily  . COPD (chronic obstructive pulmonary disease) (HCC)    Spiriva daily  . Depression    takes Effexor daily and Risperdal nightly  . Dizziness   . H/O blood clots 2012   in leg  . Headache    occasionally  . Hyperlipidemia   . Hypertension    takes Losartan and Coreg daily  . Muscle spasm    takes Zizanidine daily as needed  . Shortness of breath dyspnea    more with exertion but sometimes  notices with lying/sitting  . Sleep apnea   . Weakness    tingling and numbness    Current Outpatient Medications  Medication Sig Dispense Refill  . carvedilol (COREG) 6.25 MG tablet Take 1 tablet (6.25 mg total) by mouth 2 (two) times daily with a meal. 60 tablet 1  . diphenhydrAMINE (BENADRYL) 25 mg capsule Take 25 mg by mouth every 6 (six) hours as needed for allergies.    . furosemide (LASIX) 40 MG tablet Take 1 tablet (40 mg total) by mouth daily. 30 tablet 1  . ibuprofen (ADVIL) 400 MG tablet Take 400 mg by mouth every 8 (eight) hours as needed.    . isosorbide mononitrate (IMDUR) 60 MG 24 hr tablet Take 1 tablet (60 mg total) by mouth daily. 30 tablet 1  . losartan (COZAAR) 100 MG tablet Take 1 tablet (100 mg total) by mouth daily. 30 tablet 1  . melatonin 5  MG TABS Take 5 mg by mouth at bedtime as needed.    Marland Kitchen spironolactone (ALDACTONE) 25 MG tablet Take 0.5 tablets (12.5 mg total) by mouth daily. 15 tablet 1   No current facility-administered medications for this encounter.   Social History   Tobacco Use  . Smoking status: Current Some Day Smoker    Packs/day: 0.25    Years: 20.00    Pack years: 5.00    Types: Cigarettes  . Smokeless tobacco: Never Used  . Tobacco comment: 1-2 cigs per day (08/15/14)  Vaping Use  . Vaping Use: Never used  Substance Use Topics  . Alcohol use: No    Alcohol/week: 0.0 standard drinks    Comment: stopped in april 2014  . Drug use: Not Currently    Types: Cocaine   FHx: Mom died (2s) in MVA when he was 27 months old Dad murdered (8yo) when he was he was in 7th grade  No FHx of HF  Vitals:   04/15/20 1212  BP: 118/80  Pulse: 64  SpO2: 97%  Weight: 86.7 kg (191 lb 3.2 oz)   Wt Readings from Last 3 Encounters:  04/15/20 86.7 kg (191 lb 3.2 oz)  09/26/19 68.1 kg (150 lb 3.2 oz)  09/20/19 69.6 kg (153 lb 6.4 oz)    PHYSICAL EXAM: General:  Well appearing. No resp difficulty HEENT: normal Neck: supple. no JVD. Carotids 2+ bilat; no bruits. No lymphadenopathy or thryomegaly appreciated. Cor: PMI nondisplaced. Regular rate & rhythm. No rubs, gallops or murmurs. Lungs: clear Abdomen: soft, nontender, nondistended. No hepatosplenomegaly. No bruits or masses. Good bowel sounds. Extremities: no cyanosis, clubbing, rash, tr edema Neuro: alert & orientedx3, cranial nerves grossly intact. moves all 4 extremities w/o difficulty. Affect pleasant  REDS 45%   ASSESSMENT & PLAN:  1) Chronic systolic HF:  - NICM, ECHO 02/2013 EF ~35-40% - NYHA II symptoms.  - cath 11/14 normal cors - Echo 09/18/19 EF 20-25% RV mildly HK in setting of severe HTN - Now NYHA II-III in setting of volume overload REDS 45% - Double lasix to 40 bid x 3 days. Add Farxiga 10 daily - Resume carvedilol 6.25 mg twice a day -  Continue losartan 100 daily (no Entresto due to angiodema with ACEi) - Continue spiro 12.5 2) Polysubstance abuse -  Now going through rehab 3) Current smoker - encouraged cessation  4) HTN - BP well controlled 5) OSA- Per Dr Elsworth Soho. Not using CPAP. Encouraged to use nightly.  6) Clinic behavior - Previously d/c'd from [facility] as several staff members  did not feel comfortable in his presence. I discussed with with him openly today and said he would get one more chance with a very low tolerance. If he is disrespectful to any staff members will be dismissed from clinic on the spot.   Glori Bickers, MD  12:55 PM

## 2020-04-15 NOTE — Progress Notes (Signed)
ReDS Vest / Clip - 04/15/20 1300      ReDS Vest / Clip   Station Marker C    Ruler Value 25    ReDS Value Range High volume overload    ReDS Actual Value 45

## 2020-04-15 NOTE — Patient Instructions (Signed)
Increase Furosemide to 40 mg Twice daily FOR 3 DAYS ONLY, then back to 40 mg Daily  Start Farxiga 10 mg Daily  Labs done today, we will call you for abnormal results  Your physician recommends that you schedule a follow-up appointment in: 2 months with an echocardiogram  If you have any questions or concerns before your next appointment please send [place] a message through [place] or call our office at [phone].    TO LEAVE A MESSAGE FOR THE NURSE SELECT OPTION 2, PLEASE LEAVE A MESSAGE INCLUDING: . YOUR NAME . DATE OF BIRTH . CALL BACK NUMBER . REASON FOR CALL**this is important as we prioritize the call backs  [place] AS LONG AS YOU CALL BEFORE 4:00 PM  At the [facility], you and your health needs are our priority. As part of our continuing mission to provide you with exceptional heart care, we have created designated Provider Care Teams. These Care Teams include your primary Cardiologist (physician) and Advanced Practice Providers (APPs- Physician Assistants and Nurse Practitioners) who all work together to provide you with the care you need, when you need it.   You may see any of the following providers on your designated Care Team at your next follow up: Marland Kitchen Dr Glori Bickers . Dr Loralie Champagne . Dr Vickki Muff . Darrick Grinder, NP . Lyda Jester, [place] . Audry Riles, PharmD   Please be sure to bring in all your medications bottles to every appointment.

## 2020-04-15 NOTE — Addendum Note (Signed)
Encounter addended by: Jolaine Artist, MD on: 04/15/2020 3:29 PM  Actions taken: Level of Service modified, Visit diagnoses modified

## 2020-04-16 ENCOUNTER — Other Ambulatory Visit (HOSPITAL_COMMUNITY): Payer: Self-pay

## 2020-04-16 ENCOUNTER — Telehealth (HOSPITAL_COMMUNITY): Payer: Self-pay | Admitting: Pharmacy Technician

## 2020-04-16 NOTE — Telephone Encounter (Signed)
Patient Advocate Encounter   Received notification from [place] [employer] that prior authorization for Wilder Glade is required.   PA submitted on CoverMyMeds Key [id number] Status is pending   Will continue to follow.

## 2020-04-16 NOTE — Telephone Encounter (Signed)
Advanced Heart Failure Patient Advocate Encounter  Prior Authorization for Wilder Glade has been approved.    PA# [id number] Effective dates: 04/16/20 through 04/16/21  Patients co-pay is $3  Charlann Boxer, CPhT

## 2020-04-17 ENCOUNTER — Other Ambulatory Visit (HOSPITAL_COMMUNITY): Payer: Self-pay

## 2020-06-05 ENCOUNTER — Ambulatory Visit: Payer: Medicaid Other | Admitting: Family Medicine

## 2020-06-08 ENCOUNTER — Telehealth (HOSPITAL_COMMUNITY): Payer: Self-pay | Admitting: Vascular Surgery

## 2020-06-08 NOTE — Telephone Encounter (Signed)
Left pt message to reschedule 6/13 appt w/ echo and appt w/ np/pa.

## 2020-06-11 ENCOUNTER — Other Ambulatory Visit: Payer: Self-pay

## 2020-06-11 ENCOUNTER — Ambulatory Visit: Payer: Medicaid Other | Admitting: Family Medicine

## 2020-06-11 ENCOUNTER — Encounter: Payer: Self-pay | Admitting: Family Medicine

## 2020-06-11 VITALS — BP 110/78 | HR 73 | Ht 74.0 in | Wt 195.8 lb

## 2020-06-11 DIAGNOSIS — F17200 Nicotine dependence, unspecified, uncomplicated: Secondary | ICD-10-CM

## 2020-06-11 DIAGNOSIS — I5022 Chronic systolic (congestive) heart failure: Secondary | ICD-10-CM

## 2020-06-11 DIAGNOSIS — Z122 Encounter for screening for malignant neoplasm of respiratory organs: Secondary | ICD-10-CM

## 2020-06-11 DIAGNOSIS — Z1211 Encounter for screening for malignant neoplasm of colon: Secondary | ICD-10-CM

## 2020-06-11 DIAGNOSIS — Z0000 Encounter for general adult medical examination without abnormal findings: Secondary | ICD-10-CM | POA: Diagnosis not present

## 2020-06-11 MED ORDER — SPIRONOLACTONE 25 MG PO TABS: 12.5000 mg | ORAL_TABLET | Freq: Every day | ORAL | 0 refills | Status: DC

## 2020-06-11 MED ORDER — ISOSORBIDE MONONITRATE ER 60 MG PO TB24: 60.0000 mg | ORAL_TABLET | Freq: Every day | ORAL | 0 refills | Status: DC

## 2020-06-11 MED ORDER — GNP DIGITAL WEIGHT SCALE MISC: 1.0000 | Freq: Every day | 0 refills | Status: DC

## 2020-06-11 NOTE — Progress Notes (Signed)
   Subjective:   CC: Establish care  HPI:  Charles Cervantes is a very pleasant 60 y.o. male who presents today to re-establish care. Previously seen in our clinic but was lost to follow-up. Last seen 10/2014.  Initial concerns: Needs medication refills on his spironolactone 12.5 mg daily and imdur 60mg  daily.   Past medical history: HTN HFrEF- follows w/Dr Bensimhon. Last echo 09/18/2019 showed EF 20-25% with severely decreased LV function, global hypokinesis and moderate LVH. Alcohol use disorder Cocaine abuse Tobacco use  Past surgical history: L5-S1 fusion 2016  Current medications: Carvedilol, Farxiga, Lasix, Imdur, Losartan, Spironolactone  Family history: Both parents died when he was a child, unknown medical history aside from subtance use (alcohol)  Social history: Lives in residential substance abuse treatment facility ([facility] in [place]) Sexually active with 1 male partner Alcohol: hx of alcohol use disorder (24 beers/day). Has been sober since Dec 15th, 2021.  Tobacco: yes, smokes 1/2 PPD Recreational drugs: cocaine off and on for 20 years, sober since December 2021   Objective:  BP 110/78   Pulse 73   Ht 6\' 2"  (1.88 m)   Wt 195 lb 12.8 oz (88.8 kg)   SpO2 95%   BMI 25.14 kg/m   Vitals and nursing note reviewed  General: NAD, pleasant, able to participate in exam Cardiac: RRR, S1 S2 present. normal heart sounds, no murmurs. Respiratory: CTAB, normal effort, No wheezes, rales or rhonchi Abdomen: Bowel sounds present, non-tender, non-distended, no hepatosplenomegaly Extremities: no edema or cyanosis. Skin: warm and dry, no rashes noted Neuro: alert, no obvious focal deficits Psych: Normal affect and mood   Assessment & Plan:   Health Maintenance Due for colonoscopy- GI referral placed. Patient with >20 pack year smoking history- low dose lung CT ordered A1c and lipid panel wnl 09/2019 Will address additional health maintenance items at  future visits  HFrEF Chronic, stable. Reports good medication compliance. Follows with [name]. Has echo and cardiology appt in 1 month. -Refills for 30 day supply of his spironolactone and imdur   [name], MD [place] PGY-1

## 2020-06-11 NOTE — Patient Instructions (Signed)
It was great to meet you! Keep up the great work with your sobriety!!  I have placed a referral to GI for your colonoscopy. Someone should call you to schedule this appointment.   I have also ordered a CT of your lungs to screen for lung cancer. Someone will call you to set this up.  I have provided refills on some of your medications. Be sure to see your cardiologist as scheduled for additional refills.  Take care and seek immediate care sooner if you develop any concerns.  Dr. [facility]

## 2020-06-15 ENCOUNTER — Ambulatory Visit (HOSPITAL_COMMUNITY): Payer: Medicaid Other

## 2020-06-15 ENCOUNTER — Encounter (HOSPITAL_COMMUNITY): Payer: Medicaid Other

## 2020-06-16 ENCOUNTER — Other Ambulatory Visit: Payer: Self-pay

## 2020-06-16 ENCOUNTER — Ambulatory Visit: Payer: Medicaid Other | Admitting: Student in an Organized Health Care Education/Training Program

## 2020-06-16 VITALS — BP 112/65 | HR 64 | Ht 73.5 in | Wt 193.8 lb

## 2020-06-16 DIAGNOSIS — M79672 Pain in left foot: Secondary | ICD-10-CM | POA: Insufficient documentation

## 2020-06-16 DIAGNOSIS — Z1159 Encounter for screening for other viral diseases: Secondary | ICD-10-CM | POA: Diagnosis not present

## 2020-06-16 DIAGNOSIS — I5022 Chronic systolic (congestive) heart failure: Secondary | ICD-10-CM | POA: Diagnosis present

## 2020-06-16 DIAGNOSIS — Z9189 Other specified personal risk factors, not elsewhere classified: Secondary | ICD-10-CM

## 2020-06-16 DIAGNOSIS — M7542 Impingement syndrome of left shoulder: Secondary | ICD-10-CM | POA: Insufficient documentation

## 2020-06-16 DIAGNOSIS — M25512 Pain in left shoulder: Secondary | ICD-10-CM | POA: Diagnosis not present

## 2020-06-16 DIAGNOSIS — I10 Essential (primary) hypertension: Secondary | ICD-10-CM | POA: Diagnosis not present

## 2020-06-16 DIAGNOSIS — G8929 Other chronic pain: Secondary | ICD-10-CM | POA: Diagnosis not present

## 2020-06-16 NOTE — Assessment & Plan Note (Addendum)
Adherent with treatment. Well controlled today and asymptomatic.  Today: CBC, CMP, lipids, hgb A1c Establishing care with heart failure clinic in near future Would recommend statin therapy if tolerated. Will discuss with patient after lipid panel returns.

## 2020-06-16 NOTE — Assessment & Plan Note (Signed)
Hep c screen today

## 2020-06-16 NOTE — Assessment & Plan Note (Signed)
Worsening Subacute left foot pain exacerbated by weight bearing.  Patient is an Scientist, research (life sciences).  I have high suspicion for metatarsal arch dysfunction causing pain and patient would likely benefit from pads and/or inserts.  In addition, he has toenail disease, several thick calluses, corn, bunyons on foot prompting me to refer to podiatry for comprehensive care.

## 2020-06-16 NOTE — Assessment & Plan Note (Signed)
Strength and sensation intact. Special tests negative. Suspect impingement syndrome vs labrum concerns due to decreased ROM in abduction above 90 degrees. This passive ROM limitation is symmetrical to right side but he is asymptomatic on right. He is left handed so potentially caused worsened irritation on this side.  There is no tenderness to palpation so low suspicion for bursitis.  Provided patient with home rehab exercises and short course of antiinflammatories. Also getting an xray to evaluate for joint space abnormalities. Could consider intraarticular steroid injection if returns without improvement.

## 2020-06-16 NOTE — Progress Notes (Signed)
SUBJECTIVE:   CHIEF COMPLAINT / HPI: shoulder and foot pain  Follow up on colonoscopy- patient calling to schedule the colonoscopy currently on the phone with GI in the appointment.   Follow up on chest CT- pending insurance auth  Shoulder- left. Injured 30 years ago. Had a steroid shot at that time and it helped. Painful to lay on it. Soreness and throbbing. Pain limits mobility but no weakness or numbness. No redness or swelling. Hot water and massage has not helped. Left handed. He does not have excessive work with his left arm, he's on disability.   Foot- left. Pain when putting pressure on it. 8-9 months duration. No injuries. Acute onset. Gotten worse under the forefoot. Only hurts when he puts weight on it. Makes it difficult to ambulate. No redness or swelling. Never happened before. Tried advil and ibuprofen but no help.   OBJECTIVE:   BP 112/65   Pulse 64   Ht 6' 1.5" (1.867 m)   Wt 193 lb 12.8 oz (87.9 kg)   SpO2 97%   BMI 25.22 kg/m   Physical Exam Vitals and nursing note reviewed.  Musculoskeletal:     Right shoulder: No swelling, deformity, effusion, tenderness, bony tenderness or crepitus. Decreased range of motion (with abduction above 90 degrees only). Normal strength. Normal pulse.     Left shoulder: No swelling, deformity, effusion, tenderness, bony tenderness or crepitus. Decreased range of motion (with abduciton above 90 degrees only). Normal strength. Normal pulse.     Right upper arm: Normal.     Left upper arm: Normal.     Right foot: Normal range of motion. Bunion and prominent metatarsal heads present.     Left foot: Normal range of motion. Bunion and prominent metatarsal heads present.     Comments: Negative empty can, Neer, apprehension, and lift off tests  Feet:     Left foot:     Skin integrity: Callus and dry skin present. No ulcer, blister, skin breakdown, erythema or warmth.     Toenail Condition: Left toenails are abnormally thick.      Comments: Tenderness to palpation of central metatarsal pad on left foot. No tenderness to achilles, mid foot.  ASSESSMENT/PLAN:   Foot pain, left Worsening Subacute left foot pain exacerbated by weight bearing.  Patient is an Scientist, research (life sciences).  I have high suspicion for metatarsal arch dysfunction causing pain and patient would likely benefit from pads and/or inserts.  In addition, he has toenail disease, several thick calluses, corn, bunyons on foot prompting me to refer to podiatry for comprehensive care.   Chronic left shoulder pain Strength and sensation intact. Special tests negative. Suspect impingement syndrome vs labrum concerns due to decreased ROM in abduction above 90 degrees. This passive ROM limitation is symmetrical to right side but he is asymptomatic on right. He is left handed so potentially caused worsened irritation on this side.  There is no tenderness to palpation so low suspicion for bursitis.  Provided patient with home rehab exercises and short course of antiinflammatories. Also getting an xray to evaluate for joint space abnormalities. Could consider intraarticular steroid injection if returns without improvement.   HTN (hypertension) Adherent with treatment. Well controlled today and asymptomatic.  Today: CBC, CMP, lipids, hgb A1c Establishing care with heart failure clinic in near future Would recommend statin therapy if tolerated. Will discuss with patient after lipid panel returns.   Encounter for hepatitis C virus screening test for high risk patient Hep c screen today  [place]

## 2020-06-16 NOTE — Patient Instructions (Signed)
It was a pleasure to see you today!  To summarize our discussion for this visit: Today we are getting blood work for routine check ups including diabetes and hepatitis C screen as well as others.  I have sent in a podiatry referral for your foot pain.  I ordered a shoulder xray to be completed at the same time as your lung CT. Until then, you can continue tylenol or ibuprofen for pain and use a topical gel called voltaren. Please follow up with your GI doctor office for the colonoscopy.   Some additional health maintenance measures we should update are: Health Maintenance Due  Topic Date Due   Pneumococcal Vaccine 59-19 Years old (1 - PCV) Never done   Hepatitis C Screening  Never done   TETANUS/TDAP  Never done   COLONOSCOPY (Pts 45-59yrs Insurance coverage will need to be confirmed)  Never done   Zoster Vaccines- Shingrix (1 of 2) Never done   COVID-19 Vaccine (3 - Booster for Pfizer series) 05/31/2020     Please return to our clinic to see your PCP in about 1 month for follow up.  Call the clinic at [phone] if your symptoms worsen or you have any concerns.   Thank you for allowing me to take part in your care,  Dr. Doristine Mango  Shoulder Impingement Syndrome Rehab Ask your health care provider which exercises are safe for you. Do exercises exactly as told by your health care provider and adjust them as directed. It is normal to feel mild stretching, pulling, tightness, or discomfort as you do these exercises. Stop right away if you feel sudden pain or your pain gets worse. Do not begin these exercises until told by your health care provider. Stretching and range-of-motion exercise This exercise warms up your muscles and joints and improves the movement and flexibility of your shoulder. This exercise also helps to relieve pain andstiffness. Passive horizontal adduction In passive adduction, you use your other hand to move the injured arm toward your body. The injured arm  does not move on its own. In this movement, your arm is moved across your body in the horizontal plane (horizontal adduction). Sit or stand and pull your left / right elbow across your chest, toward your other shoulder. Stop when you feel a gentle stretch in the back of your shoulder and upper arm. Keep your arm at shoulder height. Keep your arm as close to your body as you comfortably can. Hold for __________ seconds. Slowly return to the starting position. Repeat __________ times. Complete this exercise __________ times a day. Strengthening exercises These exercises build strength and endurance in your shoulder. Endurance is theability to use your muscles for a long time, even after they get tired. External rotation, isometric This is an exercise in which you press the back of your wrist against a door frame without moving your shoulder joint (isometric). Stand or sit in a doorway, facing the door frame. Bend your left / right elbow and place the back of your wrist against the door frame. Only the back of your wrist should be touching the frame. Keep your upper arm at your side. Gently press your wrist against the door frame, as if you are trying to push your arm away from your abdomen (external rotation). Press as hard as you are able without pain. Avoid shrugging your shoulder while you press your wrist against the door frame. Keep your shoulder blade tucked down toward the middle of your back. Hold for __________  seconds. Slowly release the tension, and relax your muscles completely before you repeat the exercise. Repeat __________ times. Complete this exercise __________ times a day. Internal rotation, isometric This is an exercise in which you press your palm against a door frame without moving your shoulder joint (isometric). Stand or sit in a doorway, facing the door frame. Bend your left / right elbow and place the palm of your hand against the door frame. Only your palm should be  touching the frame. Keep your upper arm at your side. Gently press your hand against the door frame, as if you are trying to push your arm toward your abdomen (internal rotation). Press as hard as you are able without pain. Avoid shrugging your shoulder while you press your hand against the door frame. Keep your shoulder blade tucked down toward the middle of your back. Hold for __________ seconds. Slowly release the tension, and relax your muscles completely before you repeat the exercise. Repeat __________ times. Complete this exercise __________ times a day. Scapular protraction, supine  Lie on your back on a firm surface (supine position). Hold a __________ weight in your left / right hand. Raise your left / right arm straight into the air so your hand is directly above your shoulder joint. Push the weight into the air so your shoulder (scapula) lifts off the surface that you are lying on. The scapula will push up or forward (protraction). Do not move your head, neck, or back. Hold for __________ seconds. Slowly return to the starting position. Let your muscles relax completely before you repeat this exercise. Repeat __________ times. Complete this exercise __________ times a day. Scapular retraction  Sit in a stable chair without armrests, or stand up. Secure an exercise band to a stable object in front of you so the band is at shoulder height. Hold one end of the exercise band in each hand. Your palms should face down. Squeeze your shoulder blades together (retraction) and move your elbows slightly behind you. Do not shrug your shoulders upward while you do this. Hold for __________ seconds. Slowly return to the starting position. Repeat __________ times. Complete this exercise __________ times a day. Shoulder extension  Sit in a stable chair without armrests, or stand up. Secure an exercise band to a stable object in front of you so the band is above shoulder height. Hold one end of  the exercise band in each hand. Straighten your elbows and lift your hands up to shoulder height. Squeeze your shoulder blades together and pull your hands down to the sides of your thighs (extension). Stop when your hands are straight down by your sides. Do not let your hands go behind your body. Hold for __________ seconds. Slowly return to the starting position. Repeat __________ times. Complete this exercise __________ times a day. This information is not intended to replace advice given to you by your health care provider. Make sure you discuss any questions you have with your healthcare provider. Document Revised: 04/13/2018 Document Reviewed: 01/15/2018 Elsevier Patient Education  [place].

## 2020-06-17 ENCOUNTER — Telehealth: Payer: Self-pay

## 2020-06-17 LAB — LIPID PANEL
Chol/HDL Ratio: 4.8 ratio (ref 0.0–5.0)
Cholesterol, Total: 173 mg/dL (ref 100–199)
HDL: 36 mg/dL — ABNORMAL LOW (ref >39)
LDL Chol Calc (NIH): 108 mg/dL — ABNORMAL HIGH (ref 0–99)
Triglycerides: 162 mg/dL — ABNORMAL HIGH (ref 0–149)
VLDL Cholesterol Cal: 29 mg/dL (ref 5–40)

## 2020-06-17 LAB — CBC
Hematocrit: 50.2 % (ref 37.5–51.0)
Hemoglobin: 16.8 g/dL (ref 13.0–17.7)
MCH: 28.8 pg (ref 26.6–33.0)
MCHC: 33.5 g/dL (ref 31.5–35.7)
MCV: 86 fL (ref 79–97)
Platelets: 221 10*3/uL (ref 150–450)
RBC: 5.83 x10E6/uL — ABNORMAL HIGH (ref 4.14–5.80)
RDW: 13.6 % (ref 11.6–15.4)
WBC: 5.6 10*3/uL (ref 3.4–10.8)

## 2020-06-17 LAB — COMPREHENSIVE METABOLIC PANEL
ALT: 41 IU/L (ref 0–44)
AST: 34 IU/L (ref 0–40)
Albumin/Globulin Ratio: 1.7 (ref 1.2–2.2)
Albumin: 5 g/dL — ABNORMAL HIGH (ref 3.8–4.9)
Alkaline Phosphatase: 81 IU/L (ref 44–121)
BUN/Creatinine Ratio: 12 (ref 9–20)
BUN: 18 mg/dL (ref 6–24)
Bilirubin Total: 0.5 mg/dL (ref 0.0–1.2)
CO2: 23 mmol/L (ref 20–29)
Calcium: 10.2 mg/dL (ref 8.7–10.2)
Chloride: 99 mmol/L (ref 96–106)
Creatinine, Ser: 1.46 mg/dL — ABNORMAL HIGH (ref 0.76–1.27)
Globulin, Total: 3 g/dL (ref 1.5–4.5)
Glucose: 98 mg/dL (ref 65–99)
Potassium: 4.6 mmol/L (ref 3.5–5.2)
Sodium: 138 mmol/L (ref 134–144)
Total Protein: 8 g/dL (ref 6.0–8.5)
eGFR: 55 mL/min/{1.73_m2} — ABNORMAL LOW (ref >59)

## 2020-06-17 LAB — HEMOGLOBIN A1C
Est. average glucose Bld gHb Est-mCnc: 126 mg/dL
Hgb A1c MFr Bld: 6 % — ABNORMAL HIGH (ref 4.8–5.6)

## 2020-06-17 LAB — HEPATITIS C ANTIBODY: Hep C Virus Ab: <0.1 s/co ratio (ref 0.0–0.9)

## 2020-06-17 NOTE — Telephone Encounter (Signed)
On working up chart for PV, last echo noted 09/18/19 had EF of 20-25%.  Another is scheduled for 07/07/2020 which is after PV and scheduled colon.  Attempted to reach pt to schedule OV, but had to leave a detailed message.    PV and colonoscopy cancelled at this time until pt is seen in office by a provider.

## 2020-06-18 ENCOUNTER — Encounter: Payer: Self-pay | Admitting: Physician Assistant

## 2020-06-18 NOTE — Telephone Encounter (Signed)
Pt called with OV scheduled for him on 07/21/20 to be evaluated d/t low EF.  Has an new echo scheduled at the beginning of July.

## 2020-06-24 ENCOUNTER — Other Ambulatory Visit: Payer: Self-pay | Admitting: *Deleted

## 2020-06-24 DIAGNOSIS — I5022 Chronic systolic (congestive) heart failure: Secondary | ICD-10-CM

## 2020-06-24 MED ORDER — SPIRONOLACTONE 25 MG PO TABS: 12.5000 mg | ORAL_TABLET | Freq: Every day | ORAL | 0 refills | Status: DC

## 2020-06-24 MED ORDER — ISOSORBIDE MONONITRATE ER 60 MG PO TB24: 60.0000 mg | ORAL_TABLET | Freq: Every day | ORAL | 0 refills | Status: DC

## 2020-06-24 NOTE — Telephone Encounter (Signed)
Received message on referral line from patient that [facility] never received the prescriptions that were sent to them on 06-11-20.  I did call the pharmacy and confirm this.  I will resend to pharmacy and they didn't have anything on file for him.  Zeniah Briney,CMA

## 2020-06-26 ENCOUNTER — Telehealth: Payer: Self-pay | Admitting: *Deleted

## 2020-06-26 NOTE — Telephone Encounter (Signed)
Contacted pt to inform him of his CT appointment.  It is scheduled for 07/01/2020 @4 :30pm with a 4:15pm arrival.  It is being done at [facility].  Lulabelle Desta Katharina Caper, CMA

## 2020-06-29 ENCOUNTER — Other Ambulatory Visit (HOSPITAL_COMMUNITY): Payer: Self-pay

## 2020-06-30 ENCOUNTER — Other Ambulatory Visit: Payer: Self-pay

## 2020-06-30 ENCOUNTER — Encounter: Payer: Self-pay | Admitting: Podiatrist

## 2020-06-30 ENCOUNTER — Ambulatory Visit (INDEPENDENT_AMBULATORY_CARE_PROVIDER_SITE_OTHER): Payer: Medicaid Other | Admitting: Podiatrist

## 2020-06-30 ENCOUNTER — Ambulatory Visit: Payer: Medicaid Other | Admitting: Family Medicine

## 2020-06-30 ENCOUNTER — Other Ambulatory Visit (HOSPITAL_COMMUNITY): Payer: Self-pay | Admitting: *Deleted

## 2020-06-30 ENCOUNTER — Ambulatory Visit (INDEPENDENT_AMBULATORY_CARE_PROVIDER_SITE_OTHER): Payer: Medicaid Other

## 2020-06-30 VITALS — BP 160/80 | HR 60

## 2020-06-30 DIAGNOSIS — M216X2 Other acquired deformities of left foot: Secondary | ICD-10-CM | POA: Diagnosis not present

## 2020-06-30 DIAGNOSIS — I5022 Chronic systolic (congestive) heart failure: Secondary | ICD-10-CM

## 2020-06-30 DIAGNOSIS — M25512 Pain in left shoulder: Secondary | ICD-10-CM

## 2020-06-30 DIAGNOSIS — L84 Corns and callosities: Secondary | ICD-10-CM | POA: Diagnosis not present

## 2020-06-30 DIAGNOSIS — G8929 Other chronic pain: Secondary | ICD-10-CM

## 2020-06-30 MED ORDER — DICLOFENAC SODIUM 1 % EX GEL: 2.0000 g | Freq: Four times a day (QID) | CUTANEOUS | 1 refills | Status: DC

## 2020-06-30 MED ORDER — ISOSORBIDE MONONITRATE ER 60 MG PO TB24: 60.0000 mg | ORAL_TABLET | Freq: Every day | ORAL | 3 refills | Status: DC

## 2020-06-30 MED ORDER — LOSARTAN POTASSIUM 100 MG PO TABS
100.0000 mg | ORAL_TABLET | Freq: Every day | ORAL | 3 refills | Status: DC
Start: 2020-06-30 — End: 2021-02-09

## 2020-06-30 MED ORDER — SPIRONOLACTONE 25 MG PO TABS: 12.5000 mg | ORAL_TABLET | Freq: Every day | ORAL | 3 refills | Status: DC

## 2020-06-30 MED ORDER — DAPAGLIFLOZIN PROPANEDIOL 10 MG PO TABS
10.0000 mg | ORAL_TABLET | Freq: Every day | ORAL | 6 refills | Status: DC
Start: 2020-06-30 — End: 2022-02-07

## 2020-06-30 MED ORDER — CARVEDILOL 6.25 MG PO TABS
6.2500 mg | ORAL_TABLET | Freq: Two times a day (BID) | ORAL | 3 refills | Status: DC
Start: 2020-06-30 — End: 2020-12-24

## 2020-06-30 MED ORDER — FUROSEMIDE 40 MG PO TABS
40.0000 mg | ORAL_TABLET | Freq: Every day | ORAL | 3 refills | Status: DC
Start: 2020-06-30 — End: 2020-12-02

## 2020-06-30 NOTE — Progress Notes (Signed)
SUBJECTIVE:   CHIEF COMPLAINT / HPI:   Shoulder - left. Injured 30 years ago. Had a steroid shot at that time and it helped. Painful to lay on it. Soreness and throbbing. Pain limits mobility but no weakness or numbness. No redness or swelling. Hot water and massage has not helped.  He is left-handed.  He does not have excessive work with his left arm and is currently on disability.  Patient was sent for x-rays of the left shoulder on 06/16/2020 however has not yet had this done because he was not sure where to go.  Patient states that his pain is more global and sometimes expands all the way around his shoulder, however tends to be more focused in the front part of his left deltoid/bicipital groove.  Lifting objects makes the pain worse.  When he lays on his shoulder at night reduces his pain; however, laying on the other side makes the shoulder hurt worse.  He takes 2 Tylenol every day to help reduce his pain but says this medication is not very helpful.  Denies weakness or recent injuries.  Hypertension: Patient's blood pressure today 160/80.  Patient has not taken any of his medications today because he is waiting for refills from his other doctor.  He denies headaches, chest pain.   PERTINENT  PMH / PSH:  Patient Active Problem List   Diagnosis Date Noted   Foot pain, left 06/16/2020   Chronic left shoulder pain 06/16/2020   Encounter for hepatitis C virus screening test for high risk patient 06/16/2020   NICM (nonischemic cardiomyopathy) ([place]) 12/04/2015   Spinal stenosis in cervical region 04/21/2014   PTSD (post-traumatic stress disorder) 11/04/2013   HTN (hypertension) 01/01/2013   Current smoker 81/01/7508   Chronic systolic heart failure ([place]) 05/08/2012   Alcohol abuse 05/01/2012   Cocaine abuse ([place]) 05/01/2012     OBJECTIVE:   BP (!) 160/80 Comment: provider informed  Pulse 60   SpO2 96%    Physical exam: General: Well-appearing, no acute distress Respiratory:  CTA bilaterally, comfortable work of breathing Left Shoulder: Inspection reveals no obvious deformity, atrophy, or asymmetry. No bruising. No swelling Palpation reveals tenderness over bicipital groove as well as AC joint, patient states "pain is global". Full ROM in flexion, abduction, internal/external rotation NV intact distally Special Tests:  - Impingement: Neg empty can sign. - Supraspinatous: Negative empty can.  5/5 strength with resisted flexion at 20 degrees - Infraspinatous/[place]: 5/5 strength with ER. Negative pain with resisted ER - Subscapularis: 5/5 strength with IR, positive pain with resisted IR - Biceps tendon: Negative Speeds (flexion and elbow straight w/ resisted upward motion), Yerrgason's (shake hand and have patient supinate) - Labrum: Negative Obriens - AC Joint: Negative cross arm - Positive painful arc, no drop arm sign   ASSESSMENT/PLAN:   Chronic left shoulder pain Patient with chronic left shoulder pain he has been treating with Tylenol, warm compresses.  Denies any recent injuries.  Was previously seen 06/16/2020, order for x-ray of left shoulder was made however patient has not yet gone to have this done because he did not know where he was supposed to go. -No recent imaging of shoulder, gave patient contact information for x-ray location ([address]) -Continue Tylenol 2-3 tablets daily -Referral to sports medicine made for further management/evaluation of his shoulder pain with ultrasound -Patient prescribed Voltaren gel, can apply 4 times daily to left shoulder  Hypertension: Patient with elevated blood pressure today 160/80.  Patient  receives his blood pressure medicines from his cardiologist, has not yet received refill.  No chest pain, headaches at today's visit. -Asked patient to please continue his medications once he receives them -Prescribed Coreg 6.25 mg twice daily, Lasix 40 mg daily, Imdur 60 mg daily, and losartan 100 mg daily,  spironolactone 12.5 mg daily   Daisy Floro, [place]

## 2020-06-30 NOTE — Patient Instructions (Signed)
Corns and Calluses Corns are small areas of thickened skin that form on the top, sides, or tip of a toe. Corns have a cone-shaped core with a point that can press on a nerve below. This causes pain. Calluses are areas of thickened skin that can form anywhere on the body, including the hands, fingers, palms, soles of the feet, and heels. Calluses are usually larger than corns. What are the causes? Corns and calluses are caused by rubbing (friction) or pressure, such as from shoes that are too tight or do not fit properly. What increases the risk? Corns are more likely to develop in people who have misshapen toes (toe deformities), such as hammer toes. Calluses can form with friction to any area of the skin. They are more likely to develop in people who: Work with their hands. Wear shoes that fit poorly, are too tight, or are high-heeled. Have toe deformities. What are the signs or symptoms? Symptoms of a corn or callus include: A hard growth on the skin. Pain or tenderness under the skin. Redness and swelling. Increased discomfort while wearing tight-fitting shoes, if your feet are affected. If a corn or callus becomes infected, symptoms may include: Redness and swelling that gets worse. Pain. Fluid, blood, or pus draining from the corn or callus. How is this diagnosed? Corns and calluses may be diagnosed based on your symptoms, your medical history, and a physical exam. How is this treated? Treatment for corns and calluses may include: Removing the cause of the friction or pressure. This may involve: Changing your shoes. Wearing shoe inserts (orthotics) or other protective layers in your shoes, such as a corn pad. Wearing gloves. Applying medicine to the skin (topical medicine) to help soften skin in the hardened, thickened areas. Removing layers of dead skin with a file to reduce the size of the corn or callus. Removing the corn or callus with a scalpel or laser. Taking antibiotic  medicines, if your corn or callus is infected. Having surgery, if a toe deformity is the cause. Follow these instructions at home:  Take over-the-counter and prescription medicines only as told by your health care provider. If you were prescribed an antibiotic medicine, take it as told by your health care provider. Do not stop taking it even if your condition improves. Wear shoes that fit well. Avoid wearing high-heeled shoes and shoes that are too tight or too loose. Wear any padding, protective layers, gloves, or orthotics as told by your health care provider. Soak your hands or feet. Then use a file or pumice stone to soften your corn or callus. Do this as told by your health care provider. Check your corn or callus every day for signs of infection. Contact a health care provider if: Your symptoms do not improve with treatment. You have redness or swelling that gets worse. Your corn or callus becomes painful. You have fluid, blood, or pus coming from your corn or callus. You have new symptoms. Get help right away if: You develop severe pain with redness. Summary Corns are small areas of thickened skin that form on the top, sides, or tip of a toe. These can be painful. Calluses are areas of thickened skin that can form anywhere on the body, including the hands, fingers, palms, and soles of the feet. Calluses are usually larger than corns. Corns and calluses are caused by rubbing (friction) or pressure, such as from shoes that are too tight or do not fit properly. Treatment may include wearing padding, protective   layers, gloves, or orthotics as told by your health care provider. This information is not intended to replace advice given to you by your health care provider. Make sure you discuss any questions you have with your health care provider. Document Revised: 04/18/2019 Document Reviewed: 04/18/2019 Elsevier Patient Education  2022 Elsevier Inc.  

## 2020-06-30 NOTE — Progress Notes (Signed)
Chief Complaint  Patient presents with   Foot Pain    Left foot pain located at the ball of pt foot x 1 year. Pt complains of tenderness when walking on hard surfaces and pressure. No numbness tingling or edema. Pt also has 2 large calluses that are near his problem areas.      HPI: Patient is 60 y.o. male who presents today for the concerns as listed above. He has pain on the plantar left foot that hurts when he walks. He wears good supportive hiking boot type shoes.    Patient Active Problem List   Diagnosis Date Noted   Foot pain, left 06/16/2020   Chronic left shoulder pain 06/16/2020   Encounter for hepatitis C virus screening test for high risk patient 06/16/2020   NICM (nonischemic cardiomyopathy) ([place]) 12/04/2015   Spinal stenosis in cervical region 04/21/2014   PTSD (post-traumatic stress disorder) 11/04/2013   HTN (hypertension) 01/01/2013   Current smoker 32/20/2542   Chronic systolic heart failure ([place]) 05/08/2012   Alcohol abuse 05/01/2012   Cocaine abuse ([place]) 05/01/2012    Current Outpatient Medications on File Prior to Visit  Medication Sig Dispense Refill   ibuprofen (ADVIL) 400 MG tablet Take 400 mg by mouth every 8 (eight) hours as needed.     melatonin 5 MG TABS Take 5 mg by mouth at bedtime as needed.     Misc. Devices (GNP DIGITAL WEIGHT SCALE) MISC 1 each by Does not apply route daily. Use to weight yourself daily. 1 each 0   No current facility-administered medications on file prior to visit.    Allergies  Allergen Reactions   Ace Inhibitors Swelling    angioedema    Review of Systems No fevers, chills, nausea, muscle aches, no difficulty breathing, no calf pain, no chest pain or shortness of breath.   Physical Exam  GENERAL APPEARANCE: Alert, conversant. Appropriately groomed. No acute distress.   VASCULAR: Pedal pulses palpable DP and PT bilateral.  Capillary refill time is immediate to all digits,  Proximal to distal cooling it warm to warm.   Digital perfusion adequate.   NEUROLOGIC: sensation is intact to 5.07 monofilament at 5/5 sites bilateral.  Light touch is intact bilateral, vibratory sensation intact bilateral.  No pain and no palpable click elicited with palpation at the third or fourth interspace left to suggest neuroma.   MUSCULOSKELETAL: acceptable muscle strength, tone and stability bilateral.  Hammertoe 2,3,4 bilateral noted.   No pain, crepitus or limitation noted with foot and ankle range of motion bilateral.  Pain with direct pressure at the fourth metatarsal head is noted on the left foot.  This is in the area of the callus as noted.   DERMATOLOGIC: skin is warm, supple, and dry.  No open lesions noted.  No rash, no pre ulcerative lesions. Digital nails are asymptomatic.   Large callus is present submet 4 of the left foot, and submet 1.  The callus is thick and painful with palpation and pressure.     Xrays show no acute osseous abnormalities present.  An old, healed fracture of the fifth metatarsal shaft is noted.  Hammertoe 2,3,4 is seen.  Bipartite tibial sesamoid noted.    Assessment     ICD-10-CM   1. Corns and callus  L84 DG Foot Complete Left    2. Prominent metatarsal head of left foot  M21.6X2        Plan  Discussed exam and xray findings with the patient.  I recommended  paring the callus and this was carried out today with a chisel blade without complication.  Post debridement, intact integument is noted.  An offloading pad is placed in his shoe to offload the area as well.  If this fails to relieve his pain, he will call.  Otherwise he will be seen back in the future as needed.

## 2020-06-30 NOTE — Patient Instructions (Addendum)
Thank you for coming in to see [place] today! Please see below to review our plan for today's visit:  1. I have referred you to Sports Medicine. Look out for numbers you may not recognize on your phone.  2. Continue your tylenol. You can apply voltaren/Diclofenac gel to your shoulder up to 4 times daily.  3. Call the [facility] location at [address], [place], [place] [address], Phone: [phone] to see if you need to schedule an appt for your shoulder xray.   Please call the clinic at [phone] if your symptoms worsen or you have any concerns. It was our pleasure to serve you!   Dr. Milus Banister [facility]

## 2020-06-30 NOTE — Assessment & Plan Note (Signed)
Patient with chronic left shoulder pain he has been treating with Tylenol, warm compresses.  Denies any recent injuries.  Was previously seen 06/16/2020, order for x-ray of left shoulder was made however patient has not yet gone to have this done because he did not know where he was supposed to go. -No recent imaging of shoulder, gave patient contact information for x-ray location ([address]) -Continue Tylenol 2-3 tablets daily -Referral to sports medicine made for further management/evaluation of his shoulder pain with ultrasound

## 2020-06-30 NOTE — Telephone Encounter (Signed)
Per pharmacy: Pt needs new Rx for Carvedilol,losartan and furosemide as they have not been filled at [facility] before.  Ottis Stain, CMA

## 2020-07-01 ENCOUNTER — Ambulatory Visit (HOSPITAL_COMMUNITY)
Admission: RE | Admit: 2020-07-01 | Discharge: 2020-07-01 | Disposition: A | Discharge status: Home / Self Care | Payer: Medicaid Other | Source: Ambulatory Visit | Attending: Family Medicine | Admitting: Family Medicine

## 2020-07-01 DIAGNOSIS — F17210 Nicotine dependence, cigarettes, uncomplicated: Secondary | ICD-10-CM | POA: Insufficient documentation

## 2020-07-01 DIAGNOSIS — Z122 Encounter for screening for malignant neoplasm of respiratory organs: Secondary | ICD-10-CM | POA: Diagnosis not present

## 2020-07-01 DIAGNOSIS — F17200 Nicotine dependence, unspecified, uncomplicated: Secondary | ICD-10-CM

## 2020-07-01 DIAGNOSIS — K760 Fatty (change of) liver, not elsewhere classified: Secondary | ICD-10-CM | POA: Insufficient documentation

## 2020-07-01 DIAGNOSIS — I700 Atherosclerosis of aorta: Secondary | ICD-10-CM | POA: Insufficient documentation

## 2020-07-01 DIAGNOSIS — J439 Emphysema, unspecified: Secondary | ICD-10-CM | POA: Diagnosis not present

## 2020-07-02 ENCOUNTER — Encounter: Payer: Medicaid Other | Admitting: Gastroenterology

## 2020-07-06 NOTE — Progress Notes (Signed)
ADVANCED HF CLINIC NOTE  Patient ID: Charles Cervantes, male   DOB: Feb 07, 1960, 60 y.o.   MRN: [id number]  [facility] HF Cardiologist: [name]  HPI:  Charles Cervantes is a 60 yo male with systolic heart failure diagnosed in 04/2012 in [place] secondary to NICM (normal cors on cath), EF 20-25%. As well as polysubstance abuse (cocaine, tobacco and alcohol)  He moved from [facility] and was admitted to [facility] in 4/14 with progressive dyspnea and orthopnea.  Echo on admit EF 20-25% with normal RV.  Discharge weight 147 pounds.  He also had NSVT while in house and we continued his Lifevest placed in [place].  Admitted To [facility] in 8/14 with HF. EF reported 50-55% on echo. On 10/2012 bedside echo in our HF Clinic EF 35-40%. In 11/2012 presented to clinic c/o recurrent CP and severe HF symptoms but seemed well compensated on exam R/L cath showed normal cors.   He was lost to f/u in 2016.   Readmitted to [facility] 9/14-17/21 with severe HTN and recurrent CP anfd HF. Reported he was taking his HF meds except his fluid pill. BP on arrival 163/118. Diuresed and BP controlled.   UDS + for cocaine, THC and opiates.   Echo 09/18/19 EF 20-25% RV mildly HK Personally reviewed  I saw him for post hospital f/u on 09/26/19 to enroll back in HF Clinic (previously discharged due to inappropriate behavior towards staff).  Since we last saw him has been in two Treatment Centers in [place] at [facility] 2/9-3/9. Then relapsed and went to [facility] 3/21-current (gets out Tuesday). No substances since then. Has gained 40 pounds. SOB with mild activity and bending over + mild orthopnea No CP. Says he needs something to relax.   Today he returns for HF follow up. Overall feeling ok, tires easily with some SOB. Washed car this weekend, had to take time with it. Denies increasing SOB, CP, dizziness, edema, or PND/Orthopnea. Appetite ok. No fever or chills. Not weighing at home. Missed a couple days of lasix. In recovery at  [facility] in [facility]. Says he has been drug-free since Dec 18, 2019.  ECHO  04/2012 EF 20-25% 07/17/12 EF 30-35% 08/2012 EF 50-55% at [facility] ECHO 02/27/13 EF 35-40%  RHC/LHC  11/20/12  RA = 6  RV = 33/3/8  PA = 29/9 (18)  PCW = 8  Fick cardiac output/index = 6.4/3.2  PVR = 1.5 WU  FA sat = 95%  PA sat = 72%, 73%  SVC sat 72% Extensive calcification in proximal and mid LAD with only mild intraluminal stenosis with 20-30% stenosis.   CPX 2/15 FVC 2.90 (62%)  FEV1 2.22 (60%) FEV1/FVC 76%  Resting HR: 61 Peak HR: 111 (66% age predicted max HR) BP rest: 146/90 BP peak: 168/86 Peak VO2: 22.6 (64.1% predicted peak VO2) VE/VCO2 slope: 29.4 OUES: 2.49 Peak RER: 0.90  Labs 06/29/12 Potassium 5.4 Creatinine 1.05 07/17/12 Dig Level 0.5 Potassium 4.0 Creatinine 0.95 Pro BNP 244 11/20/12 K 4.0 Creatinine 1.18 12/18/12: K 4.7, Cr 0.96 05/16/13 K 4.5 Creatinine 0.91  ROS: All systems negative except as listed in HPI, PMH and Problem List.  Past Medical History:  Diagnosis Date   CHF (congestive heart failure) (HCC)    takes Furosemide daily   Chronic back pain    Constipation    takes Colace daily   COPD (chronic obstructive pulmonary disease) (HCC)    Spiriva daily   Depression    takes Effexor daily  and Risperdal nightly   Dizziness    H/O blood clots 2012   in leg   Headache    occasionally   Hyperlipidemia    Hypertension    takes Losartan and Coreg daily   Muscle spasm    takes Zizanidine daily as needed   Shortness of breath dyspnea    more with exertion but sometimes notices with lying/sitting   Sleep apnea    Weakness    tingling and numbness   Current Outpatient Medications  Medication Sig Dispense Refill   carvedilol (COREG) 6.25 MG tablet Take 1 tablet (6.25 mg total) by mouth 2 (two) times daily with a meal. 60 tablet 3   dapagliflozin propanediol (FARXIGA) 10 MG TABS tablet Take 1 tablet (10 mg total) by mouth daily before breakfast. 30 tablet 6    diclofenac Sodium (VOLTAREN) 1 % GEL Apply 2 g topically 4 (four) times daily. 50 g 1   furosemide (LASIX) 40 MG tablet Take 1 tablet (40 mg total) by mouth daily. 30 tablet 3   ibuprofen (ADVIL) 400 MG tablet Take 400 mg by mouth every 8 (eight) hours as needed.     isosorbide mononitrate (IMDUR) 60 MG 24 hr tablet Take 1 tablet (60 mg total) by mouth daily. 30 tablet 3   losartan (COZAAR) 100 MG tablet Take 1 tablet (100 mg total) by mouth daily. 30 tablet 3   melatonin 5 MG TABS Take 5 mg by mouth at bedtime as needed.     Misc. Devices (GNP DIGITAL WEIGHT SCALE) MISC 1 each by Does not apply route daily. Use to weight yourself daily. 1 each 0   spironolactone (ALDACTONE) 25 MG tablet Take 0.5 tablets (12.5 mg total) by mouth daily. 15 tablet 3   No current facility-administered medications for this encounter.   Social History   Tobacco Use   Smoking status: Some Days    Packs/day: 0.25    Years: 20.00    Pack years: 5.00    Types: Cigarettes   Smokeless tobacco: Never   Tobacco comments:    1-2 cigs per day (08/15/14)  Vaping Use   Vaping Use: Never used  Substance Use Topics   Alcohol use: No    Alcohol/week: 0.0 standard drinks    Comment: stopped in april 2014   Drug use: Not Currently    Types: Cocaine   FHx: - Mom died (52s) in MVA when he was 53 months old - Dad murdered (32yo) when he was in 7th grade  - No FHx of HF  BP 134/89   Pulse 63   Wt 86.7 kg (191 lb 1.6 oz)   SpO2 95%   BMI 24.87 kg/m   Wt Readings from Last 3 Encounters:  07/07/20 86.7 kg (191 lb 1.6 oz)  06/16/20 87.9 kg (193 lb 12.8 oz)  06/11/20 88.8 kg (195 lb 12.8 oz)   PHYSICAL EXAM: General:  NAD. No resp difficulty HEENT: Normal Neck: Supple. No JVD. Carotids 2+ bilat; no bruits. No lymphadenopathy or thryomegaly appreciated. Cor: PMI nondisplaced. Regular rate & rhythm. No rubs, gallops or murmurs. Lungs: Clear Abdomen: Soft, nontender, nondistended. No hepatosplenomegaly. No bruits  or masses. Good bowel sounds. Extremities: No cyanosis, clubbing, rash, edema Neuro: Alert & oriented x 3, cranial nerves grossly intact. Moves all 4 extremities w/o difficulty. Affect pleasant.  REDs: 43% ECG: SB 55 bpm, +LVH (personally reviewed).  ASSESSMENT & PLAN:  1) Chronic systolic HF:  - NICM, ECHO 02/2013 EF ~35-40% - NYHA  II symptoms.  - cath 11/14 normal cors - Echo 09/18/19 EF 20-25% RV mildly HK in setting of severe HTN - Now NYHA II-III in setting of volume overload REDS 43% - Double lasix to 40 bid x 3 days. - Continue lasix 40 mg daily. - Continue Farxiga 10 daily. - Continue carvedilol 6.25 mg bid. - Continue losartan 100 daily (no Entresto due to angiodema with ACEi). - Continue spiro 12.5 mg daily. - Continue imdur 60 mg daily. - BMET today.  2) Polysubstance abuse - Now going through rehab.  3) Current smoker - Encouraged cessation. - Now smoking 6-7 ciggarettess/day.   4) HTN - BP well controlled.  5) OSA  - Per Dr Elsworth Soho. Not using CPAP.  - Encouraged to use nightly.   6) Clinic behavior - Previously d/c'd from [facility] as several staff members did not feel comfortable in his presence. [name] has discussed this with him and said he would get one more chance with a very low tolerance.  - If he is disrespectful to any staff members will be dismissed from clinic on the spot.   He missed his echo appt today, will reschedule and f/u with [name] in 3 months.  [place], FNP 07/07/20

## 2020-07-07 ENCOUNTER — Ambulatory Visit (HOSPITAL_COMMUNITY)
Admission: RE | Admit: 2020-07-07 | Discharge: 2020-07-07 | Disposition: A | Discharge status: Home / Self Care | Payer: Medicaid Other | Source: Ambulatory Visit | Attending: Family Medicine | Admitting: Family Medicine

## 2020-07-07 ENCOUNTER — Encounter (HOSPITAL_COMMUNITY): Payer: Self-pay

## 2020-07-07 ENCOUNTER — Ambulatory Visit (HOSPITAL_COMMUNITY): Admission: RE | Admit: 2020-07-07 | Payer: Medicaid Other | Source: Ambulatory Visit

## 2020-07-07 ENCOUNTER — Other Ambulatory Visit: Payer: Self-pay

## 2020-07-07 VITALS — BP 134/89 | HR 63 | Wt 191.1 lb

## 2020-07-07 DIAGNOSIS — I10 Essential (primary) hypertension: Secondary | ICD-10-CM | POA: Diagnosis not present

## 2020-07-07 DIAGNOSIS — Z7901 Long term (current) use of anticoagulants: Secondary | ICD-10-CM | POA: Insufficient documentation

## 2020-07-07 DIAGNOSIS — Z79899 Other long term (current) drug therapy: Secondary | ICD-10-CM | POA: Diagnosis not present

## 2020-07-07 DIAGNOSIS — F17210 Nicotine dependence, cigarettes, uncomplicated: Secondary | ICD-10-CM | POA: Insufficient documentation

## 2020-07-07 DIAGNOSIS — I110 Hypertensive heart disease with heart failure: Secondary | ICD-10-CM | POA: Insufficient documentation

## 2020-07-07 DIAGNOSIS — R0602 Shortness of breath: Secondary | ICD-10-CM | POA: Diagnosis present

## 2020-07-07 DIAGNOSIS — Z7984 Long term (current) use of oral hypoglycemic drugs: Secondary | ICD-10-CM | POA: Insufficient documentation

## 2020-07-07 DIAGNOSIS — F17200 Nicotine dependence, unspecified, uncomplicated: Secondary | ICD-10-CM

## 2020-07-07 DIAGNOSIS — G4733 Obstructive sleep apnea (adult) (pediatric): Secondary | ICD-10-CM | POA: Diagnosis not present

## 2020-07-07 DIAGNOSIS — I428 Other cardiomyopathies: Secondary | ICD-10-CM | POA: Diagnosis not present

## 2020-07-07 DIAGNOSIS — F1910 Other psychoactive substance abuse, uncomplicated: Secondary | ICD-10-CM

## 2020-07-07 DIAGNOSIS — I5022 Chronic systolic (congestive) heart failure: Secondary | ICD-10-CM | POA: Insufficient documentation

## 2020-07-07 LAB — BASIC METABOLIC PANEL
Anion gap: 6 (ref 5–15)
BUN: 15 mg/dL (ref 6–20)
CO2: 29 mmol/L (ref 22–32)
Calcium: 9.1 mg/dL (ref 8.9–10.3)
Chloride: 100 mmol/L (ref 98–111)
Creatinine, Ser: 1.36 mg/dL — ABNORMAL HIGH (ref 0.61–1.24)
GFR, Estimated: 60 mL/min — ABNORMAL LOW (ref >60)
Glucose, Bld: 95 mg/dL (ref 70–99)
Potassium: 4.9 mmol/L (ref 3.5–5.1)
Sodium: 135 mmol/L (ref 135–145)

## 2020-07-07 NOTE — Patient Instructions (Signed)
INCREASE Lasix to 40 mg twice a day for 3 days,then back to 40 mg daily  Your physician has requested that you have an echocardiogram. Echocardiography is a painless test that uses sound waves to create images of your heart. It provides your doctor with information about the size and shape of your heart and how well your heart's chambers and valves are working. This procedure takes approximately one hour. There are no restrictions for this procedure.  Labs today We will only contact you if something comes back abnormal or we need to make some changes. Otherwise no news is good news!   Your physician recommends that you schedule a follow-up appointment in: 2-3 months with Dr Haroldine Laws  Do the following things EVERYDAY: Weigh yourself in the morning before breakfast. Write it down and keep it in a log. Take your medicines as prescribed Eat low salt foods--Limit salt (sodium) to 2000 mg per day.  Stay as active as you can everyday Limit all fluids for the day to less than 2 liters

## 2020-07-07 NOTE — Progress Notes (Signed)
ReDS Vest / Clip - 07/07/20 1200       ReDS Vest / Clip   Station Marker C    Ruler Value 28    ReDS Value Range High volume overload    ReDS Actual Value 42

## 2020-07-09 ENCOUNTER — Other Ambulatory Visit: Payer: Self-pay

## 2020-07-09 ENCOUNTER — Ambulatory Visit (HOSPITAL_COMMUNITY)
Admission: RE | Admit: 2020-07-09 | Discharge: 2020-07-09 | Disposition: A | Discharge status: Home / Self Care | Payer: Medicaid Other | Source: Ambulatory Visit | Attending: Internal Medicine | Admitting: Internal Medicine

## 2020-07-09 DIAGNOSIS — E785 Hyperlipidemia, unspecified: Secondary | ICD-10-CM | POA: Diagnosis not present

## 2020-07-09 DIAGNOSIS — I5022 Chronic systolic (congestive) heart failure: Secondary | ICD-10-CM | POA: Insufficient documentation

## 2020-07-09 DIAGNOSIS — J449 Chronic obstructive pulmonary disease, unspecified: Secondary | ICD-10-CM | POA: Diagnosis not present

## 2020-07-09 DIAGNOSIS — I110 Hypertensive heart disease with heart failure: Secondary | ICD-10-CM | POA: Diagnosis not present

## 2020-07-09 LAB — ECHOCARDIOGRAM COMPLETE
Area-P 1/2: 1.35 cm2
Calc EF: 43.8 %
S' Lateral: 3.9 cm
Single Plane A2C EF: 39.5 %
Single Plane A4C EF: 48.8 %

## 2020-07-09 NOTE — Progress Notes (Signed)
  Echocardiogram 2D Echocardiogram has been performed.  Charles Cervantes 07/09/2020, 10:56 AM

## 2020-07-10 ENCOUNTER — Ambulatory Visit (INDEPENDENT_AMBULATORY_CARE_PROVIDER_SITE_OTHER): Payer: Medicaid Other | Admitting: Sports Medicine

## 2020-07-10 DIAGNOSIS — M7542 Impingement syndrome of left shoulder: Secondary | ICD-10-CM

## 2020-07-10 DIAGNOSIS — G8929 Other chronic pain: Secondary | ICD-10-CM

## 2020-07-10 DIAGNOSIS — M25512 Pain in left shoulder: Secondary | ICD-10-CM | POA: Diagnosis not present

## 2020-07-10 MED ORDER — METHYLPREDNISOLONE ACETATE 40 MG/ML IJ SUSP
40.0000 mg | Freq: Once | INTRAMUSCULAR | Status: AC
  Administered 2020-07-10: 40 mg via INTRA_ARTICULAR

## 2020-07-10 NOTE — Progress Notes (Addendum)
    SUBJECTIVE:   CHIEF COMPLAINT / HPI: left shoulder pain  60 yo male patient presents today with left shoulder pain that has been present for about a year. He recently got insurance, which is why he is only seeking out care now. The pain is dull and aching and bothers him when he does movements above his head, for example with taking a shot in basketball, or at night. He has tried OTC analgesia with voltaren gel and tylenol which helps briefly.  He is left-hand-dominant.  PERTINENT  PMH / PSH: chronic left shoulder pain  OBJECTIVE:   BP 118/82   Ht 6' 1.5" (1.867 m)   Wt 193 lb (87.5 kg)   BMI 25.12 kg/m   Nursing note and vitals reviewed GEN: age-appropriate, AAM, resting comfortably in chair, NAD, WNWD, alert and at baseline Shoulders: No evidence of bony deformity, asymmetry, or muscle atrophy; No tenderness over long head of biceps (bicipital groove). No TTP at [facility] joint. Full active and passive range of motion on right side, abduction limited to 130* on left with active ROM, full with passive ROM (180 flex Huel Cote /150Abd /90ER /70IR), Thumb to T12 without significant tenderness. Strength 5/5 throughout. No abnormal scapular function observed. Sensation intact. Peripheral pulses intact. Positive empty can and Hawkin's test. Ext: no edema Psych: Pleasant and appropriate   ASSESSMENT/PLAN:   Impingement syndrome of left shoulder 60 yo male patient with evidence of shoulder impingement on history and exam who has failed OTC treatment so far. Unlikely to be capsulitis due to good ROM. Less likely to be full thickness rotator cuff tear as he has good strength on resistance in ER/IR and empty can. Will do CSI in subacromial joint space today, recommend HEP, and continued conservative treatment at home. If no improvement with HEP and CSI, return for evaluation as he will likely require imaging of the shoulder at that time to r/o rotator cuff tear. Discussed return  precautions.  Written and verbal consent was obtained after discussing the risks and benefits of the procedure with the patient. A timeout was performed and the correct site and side was identified and confirmed.  The left subacromial space was cleaned in with an alcohol pad x3. 40 mg Depo-medrol and 3 cc 1% Lidocaine was injected using a medial approach using a 5 cc syringe and 25 gauge 1 and 1/2 in needle. No complications were encountered. Minimal blood loss. A band aid was applied.      Gladys Damme, MD [place]

## 2020-07-10 NOTE — Assessment & Plan Note (Signed)
60 yo male patient with evidence of shoulder impingement on history and exam who has failed OTC treatment so far. Unlikely to be capsulitis due to good ROM. Less likely to be full thickness rotator cuff tear as he has good strength on resistance in ER/IR and empty can. Will do CSI in subacromial joint space today, recommend HEP, and continued conservative treatment at home. If no improvement with HEP and CSI, return for evaluation as he will likely require imaging of the shoulder at that time to r/o rotator cuff tear. Discussed return precautions.  Written and verbal consent was obtained after discussing the risks and benefits of the procedure with the patient. A timeout was performed and the correct site and side was identified and confirmed.  The left subacromial space was cleaned in with an alcohol pad x3. 40 mg Depo-medrol and 3 cc 1% Lidocaine was injected using a medial approach using a 5 cc syringe and 25 gauge 1 and 1/2 in needle. No complications were encountered. Minimal blood loss. A band aid was applied.

## 2020-07-10 NOTE — Patient Instructions (Addendum)
It was a pleasure to see you today!  For your shoulder pain: you have something called shoulder impingement, or inflammation of your rotator cuff muscles in your left shoulder. Continue to do the conservative treatment like using voltaren gel and tylenol or ibuprofen as needed. You can also use ice for pain. You received a steroid shot today, this can take 3-7 days to take effect. If you have pain tomorrow, use ice. We also recommend doing the shoulder exercises once a day. If you continue to have pain after the exercises and the injection, follow up in 4-6 weeks.  If you notice any redness, swelling, heat, or severe pain at the site of injection, call us to let us know: [phone]  Be Well,  Dr. Chauncey Reading

## 2020-07-21 ENCOUNTER — Encounter: Payer: Self-pay | Admitting: Physician Assistant

## 2020-07-21 ENCOUNTER — Ambulatory Visit (INDEPENDENT_AMBULATORY_CARE_PROVIDER_SITE_OTHER): Payer: Medicaid Other | Admitting: Physician Assistant

## 2020-07-21 VITALS — BP 140/70 | HR 68 | Ht 72.5 in | Wt 192.2 lb

## 2020-07-21 DIAGNOSIS — Z1211 Encounter for screening for malignant neoplasm of colon: Secondary | ICD-10-CM | POA: Diagnosis not present

## 2020-07-21 MED ORDER — PLENVU 140 G PO SOLR: 1.0000 | ORAL | 0 refills | Status: DC

## 2020-07-21 NOTE — Progress Notes (Signed)
Subjective:    Patient ID: Charles Cervantes, male    DOB: 17-Jan-1960, 60 y.o.   MRN: [id number]  HPI Charles Cervantes is a 60 year old African-American male, new to GI today referred by Dr. Caryl Pina [facility] for colon cancer screening. Patient has history of hypertension, nonischemic cardiomyopathy, previous history of EtOH and cocaine abuse, PTSD and spinal stenosis. He is followed by Dr. Marland Kitchen on for cardiology and echo in September 2021 showed EF of 20 to 25% with severely decreased LV function and global hypokinesis.  He is being managed with diuretics and Wilder [facility]ade and had repeat echo 07/09/2020 which shows an EF of 40 to 45%. According to the cardiology notes he had had a relapse with substance abuse earlier this year, has been back in treatment since March 2021.  Patient has not had any prior GI evaluation.  No family history of colon cancer or polyps that he is aware of.  He has no complaints of abdominal pain or discomfort, no changes in bowel habits, no melena or hematochezia.  Review of Systems Pertinent positive and negative review of systems were noted in the above HPI section.  All other review of systems was otherwise negative.   Outpatient Encounter Medications as of 07/21/2020  Medication Sig   PEG-KCl-NaCl-NaSulf-Na Asc-C (PLENVU) 140 g SOLR Take 1 kit by mouth as directed.   carvedilol (COREG) 6.25 MG tablet Take 1 tablet (6.25 mg total) by mouth 2 (two) times daily with a meal.   dapagliflozin propanediol (FARXIGA) 10 MG TABS tablet Take 1 tablet (10 mg total) by mouth daily before breakfast.   diclofenac Sodium (VOLTAREN) 1 % GEL Apply 2 g topically 4 (four) times daily.   furosemide (LASIX) 40 MG tablet Take 1 tablet (40 mg total) by mouth daily.   ibuprofen (ADVIL) 400 MG tablet Take 400 mg by mouth every 8 (eight) hours as needed.   isosorbide mononitrate (IMDUR) 60 MG 24 hr tablet Take 1 tablet (60 mg total) by mouth daily.   losartan (COZAAR) 100 MG tablet  Take 1 tablet (100 mg total) by mouth daily.   melatonin 5 MG TABS Take 5 mg by mouth at bedtime as needed.   Misc. Devices (GNP DIGITAL WEIGHT SCALE) MISC 1 each by Does not apply route daily. Use to weight yourself daily.   spironolactone (ALDACTONE) 25 MG tablet Take 0.5 tablets (12.5 mg total) by mouth daily.   No facility-administered encounter medications on file as of 07/21/2020.   Allergies  Allergen Reactions   Ace Inhibitors Swelling    angioedema   Patient Active Problem List   Diagnosis Date Noted   Foot pain, left 06/16/2020   Impingement syndrome of left shoulder 06/16/2020   Encounter for hepatitis C virus screening test for high risk patient 06/16/2020   NICM (nonischemic cardiomyopathy) ([place]) 12/04/2015   Spinal stenosis in cervical region 04/21/2014   PTSD (post-traumatic stress disorder) 11/04/2013   HTN (hypertension) 01/01/2013   Current smoker 81/15/7262   Chronic systolic heart failure ([place]) 05/08/2012   Alcohol abuse 05/01/2012   Cocaine abuse ([place]) 05/01/2012   Social History   Socioeconomic History   Marital status: Single    Spouse name: Not on file   Number of children: 0   Years of education: Not on file   Highest education level: Not on file  Occupational History   Occupation: unemployed  Tobacco Use   Smoking status: Some Days    Packs/day: 0.25    Years: 20.00  Pack years: 5.00    Types: Cigarettes   Smokeless tobacco: Never   Tobacco comments:    1-2 cigs per day (08/15/14)  Vaping Use   Vaping Use: Never used  Substance and Sexual Activity   Alcohol use: No    Alcohol/week: 0.0 standard drinks    Comment: stopped in april 2014   Drug use: Not Currently    Types: Cocaine   Sexual activity: Never  Other Topics Concern   Not on file  Social History Narrative   Not on file   Social Determinants of Health   Financial Resource Strain: Not on file  Food Insecurity: Not on file  Transportation Needs: Not on file  Physical  Activity: Not on file  Stress: Not on file  Social Connections: Not on file  Intimate Partner Violence: Not on file    Charles Cervantes's family history is not on file.      Objective:    Vitals:   07/21/20 1430  BP: 140/70  Pulse: 68    Physical Exam Well-developed well-nourished AA male  in no acute distress.  Height, Weight, 192 BMI 25.7  HEENT; nontraumatic normocephalic, EOMI, PE R LA, sclera anicteric. Oropharynx; not examined today Neck; supple, no JVD Cardiovascular; regular rate and rhythm with X4-G8, soft systolic murmur Pulmonary; Clear bilaterally Abdomen; soft, nontender, nondistended, no palpable mass or hepatosplenomegaly, bowel sounds are active Rectal; not done Skin; benign exam, no jaundice rash or appreciable lesions Extremities; no clubbing cyanosis or edema skin warm and dry Neuro/Psych; alert and oriented x4, grossly nonfocal mood and affect appropriate        Assessment & Plan:   #54 60 year old African-American male referred for colon cancer screening, asymptomatic and average risk.  #2 history of nonischemic cardiomyopathy/CHF-he has had significant improvement in EF over the past 9 months.  Very recent echo July 09, 2020 with EF 40 to 45%  #3 spinal stenosis #4  History of substance abuse #5.  PTSD #6.  Hypertension  Plan; Patient will be scheduled for colonoscopy [facility] with Dr. Bryan Lemma.  Procedure was discussed in detail with the patient including indications risks and benefits and he is agreeable to proceed.   Charles Cervantes S Eugene Zeiders PA-C 07/21/2020   Cc: [name], MD

## 2020-07-21 NOTE — Patient Instructions (Signed)
If you are age 59 or younger, your body mass index should be between 19-25. Your Body mass index is 25.71 kg/m. If this is out of the aformentioned range listed, please consider follow up with your Primary Care Provider.  __________________________________________________________  The [facility] providers would like to encourage you to use [facility] to communicate with providers for non-urgent requests or questions.  Due to long hold times on the telephone, sending your provider a message by [facility] may be a faster and more efficient way to get a response.  Please allow 48 business hours for a response.  Please remember that this is for non-urgent requests.   You have been scheduled for a colonoscopy. Please follow written instructions given to you at your visit today.  Please pick up your prep supplies at the pharmacy within the next 1-3 days. If you use inhalers (even only as needed), please bring them with you on the day of your procedure.  Follow up as needed.  Thank you for entrusting me with your care and choosing [facility].  Amy Esterwood, PA-C

## 2020-07-23 ENCOUNTER — Telehealth: Payer: Self-pay | Admitting: Physician Assistant

## 2020-07-23 MED ORDER — PLENVU 140 G PO SOLR: 1.0000 | ORAL | 0 refills | Status: DC

## 2020-07-23 NOTE — Telephone Encounter (Signed)
Inbound call from patient stating pharmacy informed him they have not received prep medication.  Please advise.

## 2020-07-23 NOTE — Telephone Encounter (Signed)
Plenvu has been resent to [facility].

## 2020-07-24 NOTE — Progress Notes (Signed)
Agree with the assessment and plan as outlined by Amy Esterwood, PA-C.  Jaleen Grupp, DO, FACG  

## 2020-08-07 ENCOUNTER — Encounter: Payer: Self-pay | Admitting: Gastroenterology

## 2020-08-07 ENCOUNTER — Other Ambulatory Visit: Payer: Self-pay

## 2020-08-07 ENCOUNTER — Ambulatory Visit (AMBULATORY_SURGERY_CENTER): Payer: Medicaid Other | Admitting: Gastroenterology

## 2020-08-07 VITALS — BP 115/49 | HR 66 | Temp 97.1°F | Resp 14 | Ht 72.0 in | Wt 192.0 lb

## 2020-08-07 DIAGNOSIS — D128 Benign neoplasm of rectum: Secondary | ICD-10-CM

## 2020-08-07 DIAGNOSIS — K621 Rectal polyp: Secondary | ICD-10-CM | POA: Diagnosis not present

## 2020-08-07 DIAGNOSIS — K640 First degree hemorrhoids: Secondary | ICD-10-CM

## 2020-08-07 DIAGNOSIS — Z1211 Encounter for screening for malignant neoplasm of colon: Secondary | ICD-10-CM | POA: Diagnosis not present

## 2020-08-07 MED ORDER — SODIUM CHLORIDE 0.9 % IV SOLN
500.0000 mL | Freq: Once | INTRAVENOUS | Status: DC
Start: 2020-08-07 — End: 2020-08-07

## 2020-08-07 NOTE — Op Note (Signed)
[facility] Patient Name: Charles Cervantes Procedure Date: 08/07/2020 1:49 PM MRN: [id number] Endoscopist: Gerrit Heck , MD Age: 60 Referring MD:  Date of Birth: [birth year] Gender: Male Account #: [id number] Procedure:                Colonoscopy Indications:              Screening for colorectal malignant neoplasm, This                            is the patient's first colonoscopy Medicines:                Monitored Anesthesia Care Procedure:                Pre-Anesthesia Assessment:                           - Prior to the procedure, a History and Physical                            was performed, and patient medications and                            allergies were reviewed. The patient's tolerance of                            previous anesthesia was also reviewed. The risks                            and benefits of the procedure and the sedation                            options and risks were discussed with the patient.                            All questions were answered, and informed consent                            was obtained. Prior Anticoagulants: The patient has                            taken no previous anticoagulant or antiplatelet                            agents. ASA Grade Assessment: III - A patient with                            severe systemic disease. After reviewing the risks                            and benefits, the patient was deemed in                            satisfactory condition to undergo the procedure.  After obtaining informed consent, the colonoscope                            was passed under direct vision. Throughout the                            procedure, the patient's blood pressure, pulse, and                            oxygen saturations were monitored continuously. The                            CF [id number] was introduced through the anus                            and advanced to the the  cecum, identified by                            appendiceal orifice and ileocecal valve. The                            colonoscopy was performed without difficulty. The                            patient tolerated the procedure well. The quality                            of the bowel preparation was good. The ileocecal                            valve, appendiceal orifice, and rectum were                            photographed. Scope In: 2:09:46 PM Scope Out: 2:28:56 PM Scope Withdrawal Time: 0 hours 15 minutes 12 seconds  Total Procedure Duration: 0 hours 19 minutes 10 seconds  Findings:                 The perianal and digital rectal examinations were                            normal.                           Three sessile polyps were found in the rectum. The                            polyps were 2 to 4 mm in size. These polyps were                            removed with a cold snare. Resection and retrieval                            were complete. Estimated blood loss was minimal.  Non-bleeding internal hemorrhoids were found during                            retroflexion. The hemorrhoids were small.                           The exam was otherwise normal throughout the                            remainder of the colon. Complications:            No immediate complications. Estimated Blood Loss:     Estimated blood loss was minimal. Impression:               - Three 2 to 4 mm polyps in the rectum, removed                            with a cold snare. Resected and retrieved.                           - Non-bleeding internal hemorrhoids. Recommendation:           - Patient has a contact number available for                            emergencies. The signs and symptoms of potential                            delayed complications were discussed with the                            patient. Return to normal activities tomorrow.                             Written discharge instructions were provided to the                            patient.                           - Resume previous diet.                           - Continue present medications.                           - Await pathology results.                           - Repeat colonoscopy for surveillance based on                            pathology results.                           - Return to GI office PRN. Gerrit Heck, MD 08/07/2020 2:34:20 PM

## 2020-08-07 NOTE — Patient Instructions (Signed)
YOU HAD AN ENDOSCOPIC PROCEDURE TODAY AT THE [facility]:   Refer to the procedure report that was given to you for any specific questions about what was found during the examination.  If the procedure report does not answer your questions, please call your gastroenterologist to clarify.  If you requested that your care partner not be given the details of your procedure findings, then the procedure report has been included in a sealed envelope for you to review at your convenience later.  YOU SHOULD EXPECT: Some feelings of bloating in the abdomen. Passage of more gas than usual.  Walking can help get rid of the air that was put into your GI tract during the procedure and reduce the bloating. If you had a lower endoscopy (such as a colonoscopy or flexible sigmoidoscopy) you may notice spotting of blood in your stool or on the toilet paper. If you underwent a bowel prep for your procedure, you may not have a normal bowel movement for a few days.  Please Note:  You might notice some irritation and congestion in your nose or some drainage.  This is from the oxygen used during your procedure.  There is no need for concern and it should clear up in a day or so.  SYMPTOMS TO REPORT IMMEDIATELY:   Following lower endoscopy (colonoscopy or flexible sigmoidoscopy):  Excessive amounts of blood in the stool  Significant tenderness or worsening of abdominal pains  Swelling of the abdomen that is new, acute  Fever of 100F or higher   Following upper endoscopy (EGD)  Vomiting of blood or coffee ground material  New chest pain or pain under the shoulder blades  Painful or persistently difficult swallowing  New shortness of breath  Fever of 100F or higher  Black, tarry-looking stools  For urgent or emergent issues, a gastroenterologist can be reached at any hour by calling [phone]. Do not use MyChart messaging for urgent concerns.    DIET:  We do recommend a small meal at first, but  then you may proceed to your regular diet.  Drink plenty of fluids but you should avoid alcoholic beverages for 24 hours.  ACTIVITY:  You should plan to take it easy for the rest of today and you should NOT DRIVE or use heavy machinery until tomorrow (because of the sedation medicines used during the test).    FOLLOW UP: Our staff will call the number listed on your records 48-72 hours following your procedure to check on you and address any questions or concerns that you may have regarding the information given to you following your procedure. If we do not reach you, we will leave a message.  We will attempt to reach you two times.  During this call, we will ask if you have developed any symptoms of COVID 19. If you develop any symptoms (ie: fever, flu-like symptoms, shortness of breath, cough etc.) before then, please call [phone].  If you test positive for Covid 19 in the 2 weeks post procedure, please call and report this information to us.    If any biopsies were taken you will be contacted by phone or by letter within the next 1-3 weeks.  Please call us at [phone] if you have not heard about the biopsies in 3 weeks.    SIGNATURES/CONFIDENTIALITY: You and/or your care partner have signed paperwork which will be entered into your electronic medical record.  These signatures attest to the fact that that the information above on   your After Visit Summary has been reviewed and is understood.  Full responsibility of the confidentiality of this discharge information lies with you and/or your care-partner. 

## 2020-08-07 NOTE — Progress Notes (Signed)
Check-in-AER  V/S-CW

## 2020-08-07 NOTE — Progress Notes (Signed)
Report given to PACU, vss 

## 2020-08-08 NOTE — Addendum Note (Signed)
Encounter addended by: Annie Paras on: 08/08/2020 12:50 PM  Actions taken: Letter saved

## 2020-08-11 ENCOUNTER — Telehealth: Payer: Self-pay | Admitting: *Deleted

## 2020-08-11 NOTE — Telephone Encounter (Signed)
  Follow up Call-  Call back number 08/07/2020  Post procedure Call Back phone  # [phone]  Permission to leave phone message Yes  Some recent data might be hidden     Patient questions:  Do you have a fever, pain , or abdominal swelling? No. Pain Score  0 *  Have you tolerated food without any problems? Yes.    Have you been able to return to your normal activities? Yes.    Do you have any questions about your discharge instructions: Diet   No. Medications  No. Follow up visit  No.  Do you have questions or concerns about your Care? No.  Actions: * If pain score is 4 or above: No action needed, pain <4.  Have you developed a fever since your procedure? no  2.   Have you had an respiratory symptoms (SOB or cough) since your procedure? no  3.   Have you tested positive for COVID 19 since your procedure no  4.   Have you had any family members/close contacts diagnosed with the COVID 19 since your procedure?  no   If yes to any of these questions please route to Joylene John, RN and Joella Prince, RN

## 2020-08-11 NOTE — Telephone Encounter (Signed)
Follow up call made. 

## 2020-08-12 ENCOUNTER — Ambulatory Visit (INDEPENDENT_AMBULATORY_CARE_PROVIDER_SITE_OTHER): Payer: Medicaid Other

## 2020-08-12 ENCOUNTER — Other Ambulatory Visit: Payer: Self-pay

## 2020-08-12 ENCOUNTER — Ambulatory Visit: Payer: Medicaid Other | Admitting: Podiatry

## 2020-08-12 DIAGNOSIS — Q6689 Other  specified congenital deformities of feet: Secondary | ICD-10-CM

## 2020-08-12 DIAGNOSIS — M79671 Pain in right foot: Secondary | ICD-10-CM

## 2020-08-12 DIAGNOSIS — M79672 Pain in left foot: Secondary | ICD-10-CM

## 2020-08-12 DIAGNOSIS — Q828 Other specified congenital malformations of skin: Secondary | ICD-10-CM

## 2020-08-14 NOTE — Progress Notes (Signed)
Chief Complaint  Patient presents with   Foot Pain    PT states he has foot pain on both feet that had been persistent since June. Pt states he uses epsom salt soaks as well as tylenol for pain.      HPI: Patient is 60 y.o. male who presents today for the concerns as listed above.  He has discussed the degree down in the past.  He is known to Dr. Rolley Sims.  He would like to get them debrided down again as it has not gotten any better.  Patient Active Problem List   Diagnosis Date Noted   Foot pain, left 06/16/2020   Impingement syndrome of left shoulder 06/16/2020   Encounter for hepatitis C virus screening test for high risk patient 06/16/2020   NICM (nonischemic cardiomyopathy) ([place]) 12/04/2015   Spinal stenosis in cervical region 04/21/2014   PTSD (post-traumatic stress disorder) 11/04/2013   HTN (hypertension) 01/01/2013   Current smoker [id number]   Chronic systolic heart failure ([place]) 05/08/2012   Alcohol abuse 05/01/2012   Cocaine abuse ([place]) 05/01/2012    Current Outpatient Medications on File Prior to Visit  Medication Sig Dispense Refill   carvedilol (COREG) 6.25 MG tablet Take 1 tablet (6.25 mg total) by mouth 2 (two) times daily with a meal. 60 tablet 3   dapagliflozin propanediol (FARXIGA) 10 MG TABS tablet Take 1 tablet (10 mg total) by mouth daily before breakfast. 30 tablet 6   diclofenac Sodium (VOLTAREN) 1 % GEL Apply 2 g topically 4 (four) times daily. 50 g 1   furosemide (LASIX) 40 MG tablet Take 1 tablet (40 mg total) by mouth daily. 30 tablet 3   ibuprofen (ADVIL) 400 MG tablet Take 400 mg by mouth every 8 (eight) hours as needed.     isosorbide mononitrate (IMDUR) 60 MG 24 hr tablet Take 1 tablet (60 mg total) by mouth daily. 30 tablet 3   losartan (COZAAR) 100 MG tablet Take 1 tablet (100 mg total) by mouth daily. 30 tablet 3   melatonin 5 MG TABS Take 5 mg by mouth at bedtime as needed.     Misc. Devices (GNP DIGITAL WEIGHT SCALE) MISC 1 each by Does not  apply route daily. Use to weight yourself daily. 1 each 0   spironolactone (ALDACTONE) 25 MG tablet Take 0.5 tablets (12.5 mg total) by mouth daily. 15 tablet 3   No current facility-administered medications on file prior to visit.    Allergies  Allergen Reactions   Ace Inhibitors Swelling    angioedema    Review of Systems No fevers, chills, nausea, muscle aches, no difficulty breathing, no calf pain, no chest pain or shortness of breath.   Physical Exam  GENERAL APPEARANCE: Alert, conversant. Appropriately groomed. No acute distress.   VASCULAR: Pedal pulses palpable DP and PT bilateral.  Capillary refill time is immediate to all digits,  Proximal to distal cooling it warm to warm.  Digital perfusion adequate.   NEUROLOGIC: sensation is intact to 5.07 monofilament at 5/5 sites bilateral.  Light touch is intact bilateral, vibratory sensation intact bilateral.  No pain and no palpable click elicited with palpation at the third or fourth interspace left to suggest neuroma.   MUSCULOSKELETAL: acceptable muscle strength, tone and stability bilateral.  Hammertoe 2,3,4 bilateral noted.   No pain, crepitus or limitation noted with foot and ankle range of motion bilateral.  Pain with direct pressure at the fourth metatarsal head is noted on the left foot.  This is  in the area of the callus as noted.   DERMATOLOGIC: skin is warm, supple, and dry.  No open lesions noted.  No rash, no pre ulcerative lesions. Digital nails are asymptomatic.   Large callus is present submet 4 of the left foot, and submet 1.  The callus is thick and painful with palpation and pressure.     Xrays show no acute osseous abnormalities present.  An old, healed fracture of the fifth metatarsal shaft is noted.  Hammertoe 2,3,4 is seen.  Bipartite tibial sesamoid noted.    Assessment     ICD-10-CM   1. Porokeratosis  Q82.8 DG Foot Complete Right    DG Foot Complete Left       Plan  Discussed exam and xray  findings with the patient.  I recommended paring the callus and this was carried out today with a chisel blade without complication.  Post debridement, intact integument is noted.  An offloading pad is placed in his shoe to offload the area as well.  If this fails to relieve his pain, he will call.  Otherwise he will be seen back in the future as needed.

## 2020-08-18 ENCOUNTER — Encounter: Payer: Self-pay | Admitting: Gastroenterology

## 2020-08-24 ENCOUNTER — Other Ambulatory Visit: Payer: Self-pay

## 2020-08-24 ENCOUNTER — Encounter: Payer: Self-pay | Admitting: Family Medicine

## 2020-08-24 ENCOUNTER — Ambulatory Visit (INDEPENDENT_AMBULATORY_CARE_PROVIDER_SITE_OTHER): Payer: Medicaid Other | Admitting: Family Medicine

## 2020-08-24 DIAGNOSIS — Z23 Encounter for immunization: Secondary | ICD-10-CM | POA: Diagnosis present

## 2020-08-24 DIAGNOSIS — Z7689 Persons encountering health services in other specified circumstances: Secondary | ICD-10-CM

## 2020-08-24 DIAGNOSIS — G5621 Lesion of ulnar nerve, right upper limb: Secondary | ICD-10-CM

## 2020-08-24 MED ORDER — TRAZODONE HCL 50 MG PO TABS: 25.0000 mg | ORAL_TABLET | Freq: Every evening | ORAL | 3 refills | Status: DC | PRN

## 2020-08-24 NOTE — Progress Notes (Signed)
    SUBJECTIVE:   CHIEF COMPLAINT / HPI:   Numbness in the right pinky and ring finger Patient complains of numbness which affects the entire anterior aspect of the pinky finger as well as the medial side of the ring finger of his right upper extremity.  Reports that this has been going on for several months.  Reports that it is constant and he cannot identify anything that improves the numbness.  Denies any weakness in the hand.  Reports that he constantly rests his elbow on the armrest.  Denies any traumatic injury to the arm.  Sleep concerns Patient reports he is having difficulty sleeping and that he wakes every morning around 3 AM.  Reports that he goes to sleep between 930 and 1030 and he has been taking over-the-counter melatonin with no relief.  He admits that he does sometimes take an hour nap during the day because he is not working at this time.  He feels like once he starts working he will have something to occupy his mind and not take naps during the day.  He is a recovering alcohol and drug addict and does not want any controlled substances to help with the sleep.   OBJECTIVE:   BP 112/72   Pulse (!) 58   Ht 6' (1.829 m)   Wt 191 lb 6.4 oz (86.8 kg)   SpO2 (!) 82%   BMI 25.96 kg/m   General: Well-appearing 60 year old male in no acute distress Cardiac: Regular rate and rhythm, no murmurs appreciated Respiratory: Normal work of breathing MSK: No gross abnormalities, patient reports shooting sensation down the medial aspect of left forearm into the fourth and fifth digit when palpating the medial aspect of the elbow.  Pulses intact in right upper extremity  ASSESSMENT/PLAN:   Cubital tunnel syndrome on right Patient with symptoms and physical exam consistent with cubital tunnel syndrome.  Discussed ensuring he keeps arms extended and decreasing pressure on the elbow.  He will work on practices decreasing the pressure and follow-up as needed.  Strict return precautions  given.  Sleep concern Patient reports 5-6 hours of sleep at night and waking up at 3 AM.  We discussed healthy sleep habits such as not napping during the day, decreasing caffeine intake, pushing bedtime back.  Patient has been taking over-the-counter melatonin.  Will prescribe low-dose trazodone and follow-up in 1-2 months to assess if this has helped improve his sleep.  Return precautions given.     Gifford Shave, MD [place]

## 2020-08-24 NOTE — Patient Instructions (Signed)
It was great seeing you today.  Regarding your numbness in your finger I think you have what is called an ulnar nerve compression.  Do your best to not rest your elbows on things and keep your arm straight.  Regarding your sleep issues I have sent a prescription for a medication called trazodone to your pharmacy.  You can take one half of a tablet to 1 tablet per night to help with the sleep.  Start out with the half tablet and increase if needed.  If you have any questions or concerns please call the clinic.  I hope you have a wonderful afternoon!

## 2020-08-25 DIAGNOSIS — G5621 Lesion of ulnar nerve, right upper limb: Secondary | ICD-10-CM | POA: Insufficient documentation

## 2020-08-25 DIAGNOSIS — Z7689 Persons encountering health services in other specified circumstances: Secondary | ICD-10-CM | POA: Insufficient documentation

## 2020-08-25 NOTE — Assessment & Plan Note (Signed)
Patient reports 5-6 hours of sleep at night and waking up at 3 AM.  We discussed healthy sleep habits such as not napping during the day, decreasing caffeine intake, pushing bedtime back.  Patient has been taking over-the-counter melatonin.  Will prescribe low-dose trazodone and follow-up in 1-2 months to assess if this has helped improve his sleep.  Return precautions given.

## 2020-08-25 NOTE — Assessment & Plan Note (Signed)
Patient with symptoms and physical exam consistent with cubital tunnel syndrome.  Discussed ensuring he keeps arms extended and decreasing pressure on the elbow.  He will work on practices decreasing the pressure and follow-up as needed.  Strict return precautions given.

## 2020-09-02 ENCOUNTER — Other Ambulatory Visit: Payer: Self-pay | Admitting: Podiatry

## 2020-09-02 DIAGNOSIS — Q828 Other specified congenital malformations of skin: Secondary | ICD-10-CM

## 2020-09-22 ENCOUNTER — Ambulatory Visit: Payer: Medicaid Other | Admitting: Podiatrist

## 2020-09-28 ENCOUNTER — Other Ambulatory Visit: Payer: Self-pay

## 2020-09-28 ENCOUNTER — Ambulatory Visit: Payer: Medicaid Other | Admitting: Family Medicine

## 2020-09-28 VITALS — BP 104/81 | HR 79 | Ht 73.5 in | Wt 189.0 lb

## 2020-09-28 DIAGNOSIS — M549 Dorsalgia, unspecified: Secondary | ICD-10-CM

## 2020-09-28 MED ORDER — ACETAMINOPHEN 500 MG PO TABS: 1000.0000 mg | ORAL_TABLET | Freq: Four times a day (QID) | ORAL | 0 refills | Status: AC | PRN

## 2020-09-28 MED ORDER — LIDOCAINE 5 % EX PTCH: 1.0000 | MEDICATED_PATCH | CUTANEOUS | 0 refills | Status: AC

## 2020-09-28 NOTE — Patient Instructions (Signed)
Thank you for coming to see me today. It was a pleasure. Today we discussed your back pain. I recommend: Tylenol 500mg -1000mg  every 4-6H Lidocaine patches every 24 hrs Referred you to physical therapy   Please follow-up with PCP if no improvement in symptoms  If you have any questions or concerns, please do not hesitate to call the office at [phone].  Best wishes,   Dr Posey Pronto

## 2020-09-28 NOTE — Assessment & Plan Note (Signed)
Acute lumbar back pain likely musculoskeletal. Pt has a hx of surgery for spinal stenosis. No obvious triggers. Could be due to the recent change in weather. No red flags. Recommended tylenol 500-1000mg  Q4-6H, lidocaine patch, gentle movement and physical therapy. Recommended against NSAIDs due to hx of HTN and CHF. Follow up with PCP if persistent symptoms. Could consider referring back to orthopedics.

## 2020-09-28 NOTE — Progress Notes (Addendum)
     SUBJECTIVE:   CHIEF COMPLAINT / HPI:   Charles Cervantes is a 60 y.o. male presents for back pain   Back Pain Onset: 3 weeks ago Location/Radiation: none Duration: constant   Character: sharp   Aggravating Factors: moving   Alleviating Factors: none   Severity: 10/10  Denies history of trauma, history of prolonged steroid use, bowel or bladder incontinence, urinary retention, numbness or tingling in extremities or saddle anesthesia, fevers, chills, IV drug use, hemodialysis, hx cancer, changes in weight, night sweats. No history of osteoporosis.  No history of prostate cancer.  Mild weakness in extremities Occupation: No heavy lifting, repetitive vibrations, bending or twisting motions.   Usually gets OTC pain meds for back pain.   [facility] [facility] Office Visit from 08/24/2020 in [facility]  PHQ-9 Total Score 73       PERTINENT  PMH / PSH: PTSD, cocaine abuse, ETOH abuse   OBJECTIVE:   BP 104/81   Pulse 79   Ht 6' 1.5" (1.867 m)   Wt 189 lb (85.7 kg)   SpO2 98%   BMI 24.60 kg/m    General: Alert, no acute distress Cardio: well perfused  Pulm: normal work of breathing Neuro: Cranial nerves grossly intact, 5/5 strength lower extremities with normal sensation  Normal gait  Back Normal skin, Spine with normal alignment and no deformity.  Tenderness to vertebral process palpation in lumbar region.  Lumbar Paraspinous muscles are tender.   Range of motion is full at neck and lumbar sacral regions. Negative straight leg test.   ASSESSMENT/PLAN:   Lumbar back pain Acute lumbar back pain likely musculoskeletal. Pt has a hx of surgery for spinal stenosis. No obvious triggers. Could be due to the recent change in weather. No red flags. Recommended tylenol 500-1000mg  Q4-6H, lidocaine patch, gentle movement and physical therapy. Recommended against NSAIDs due to hx of HTN and CHF. Follow up with PCP if persistent symptoms. Could consider referring back to  orthopedics.     Lattie Haw, MD PGY-3 [place]

## 2020-09-29 ENCOUNTER — Encounter (HOSPITAL_COMMUNITY): Payer: Self-pay

## 2020-09-29 ENCOUNTER — Telehealth: Payer: Self-pay

## 2020-09-29 ENCOUNTER — Other Ambulatory Visit: Payer: Self-pay

## 2020-09-29 ENCOUNTER — Ambulatory Visit (HOSPITAL_COMMUNITY)
Admission: EM | Admit: 2020-09-29 | Discharge: 2020-09-29 | Disposition: A | Discharge status: Home / Self Care | Payer: Medicaid Other

## 2020-09-29 DIAGNOSIS — Z008 Encounter for other general examination: Secondary | ICD-10-CM

## 2020-09-29 DIAGNOSIS — I10 Essential (primary) hypertension: Secondary | ICD-10-CM

## 2020-09-29 NOTE — Telephone Encounter (Signed)
I have written this letter for the patient. It is under communications tab. Please kindly re-iterate clinic policy regarding paperwork. Thank you!

## 2020-09-29 NOTE — ED Triage Notes (Signed)
Pt presents with recurrent elevated blood pressure even with taking prescribed medication; pt presents today to get a letter of clearance to be able to return to work.

## 2020-09-29 NOTE — Discharge Instructions (Addendum)
-  Please check your blood pressure at home or at the pharmacy. If this continues to be >140/90, follow-up with your primary care provider for further blood pressure management/ medication titration. If you develop chest pain, shortness of breath, vision changes, the worst headache of your life- head straight to the ED or call 911.

## 2020-09-29 NOTE — Telephone Encounter (Signed)
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 Patient states that he needs this documentation by 5 pm today. Informed of policy regarding paperwork. Patient states that this is an urgent request and would like it handled as soon as possible.   Please advise.   Talbot Grumbling, RN

## 2020-09-29 NOTE — Telephone Encounter (Signed)
Patient calls again regarding this request. Please advise.

## 2020-09-29 NOTE — ED Provider Notes (Signed)
[facility]    CSN: [id number] Arrival date & time: 09/29/20  1148      History   Chief Complaint Chief Complaint  Patient presents with   Hypertension    HPI Charles Cervantes is a 60 y.o. male presenting for note to return to work.  Medical history extensive as below.  States that he was out of work for his back pain, actually saw his primary care about this 1 day ago and they prescribed some NSAIDs, he requires clearance to return to work.  His job does not involve heavy lifting. Denies pain shooting down legs, denies numbness in arms/legs, denies weakness in arms/legs, denies saddle anesthesia, denies bowel/bladder incontinence, denies urinary retention, denies constipation.  Unsure why he did not follow-up with his primary care for this.  HPI  Past Medical History:  Diagnosis Date   Anxiety    CHF (congestive heart failure) (HCC)    takes Furosemide daily   Chronic back pain    Constipation    takes Colace daily   COPD (chronic obstructive pulmonary disease) ([place])    Spiriva daily   Depression    takes Effexor daily and Risperdal nightly   Dizziness    H/O blood clots 01/03/2010   in leg   Headache    occasionally   Hyperlipidemia    Hypertension    takes Losartan and Coreg daily   Muscle spasm    takes Zizanidine daily as needed   Shortness of breath dyspnea    more with exertion but sometimes notices with lying/sitting   Sleep apnea    Substance abuse ([place])    Weakness    tingling and numbness    Patient Active Problem List   Diagnosis Date Noted   Cubital tunnel syndrome on right 08/25/2020   Sleep concern 08/25/2020   Foot pain, left 06/16/2020   Impingement syndrome of left shoulder 06/16/2020   Encounter for hepatitis C virus screening test for high risk patient 06/16/2020   NICM (nonischemic cardiomyopathy) ([place]) 12/04/2015   Lumbar back pain 09/03/2014   Spinal stenosis in cervical region 04/21/2014   PTSD (post-traumatic stress  disorder) 11/04/2013   HTN (hypertension) 01/01/2013   Current smoker 17/79/3903   Chronic systolic heart failure ([place]) 05/08/2012   Alcohol abuse 05/01/2012   Cocaine abuse ([place]) 05/01/2012    Past Surgical History:  Procedure Laterality Date   LEFT AND RIGHT HEART CATHETERIZATION WITH CORONARY ANGIOGRAM N/A 11/20/2012   Procedure: LEFT AND RIGHT HEART CATHETERIZATION WITH CORONARY ANGIOGRAM;  Surgeon: Jolaine Artist, MD;  Location: [facility] CATH LAB;  Service: Cardiovascular;  Laterality: N/A;   Left Second finger surgery     LUMBAR FUSION  09/05/2014   L5 S1       Home Medications    Prior to Admission medications   Medication Sig Start Date End Date Taking? Authorizing Provider  acetaminophen (TYLENOL) 500 MG tablet Take 2 tablets (1,000 mg total) by mouth every 6 (six) hours as needed. 09/28/20   Lattie Haw, MD  carvedilol (COREG) 6.25 MG tablet Take 1 tablet (6.25 mg total) by mouth 2 (two) times daily with a meal. 06/30/20   Milford, Maricela Bo, FNP  dapagliflozin propanediol (FARXIGA) 10 MG TABS tablet Take 1 tablet (10 mg total) by mouth daily before breakfast. 06/30/20   Milford, Maricela Bo, FNP  diclofenac Sodium (VOLTAREN) 1 % GEL Apply 2 g topically 4 (four) times daily. 06/30/20   Daisy Floro, DO  furosemide (LASIX) 40  MG tablet Take 1 tablet (40 mg total) by mouth daily. 06/30/20   [place]ford, Maricela Bo, FNP  ibuprofen (ADVIL) 400 MG tablet Take 400 mg by mouth every 8 (eight) hours as needed.    [provider]  isosorbide mononitrate (IMDUR) 60 MG 24 hr tablet Take 1 tablet (60 mg total) by mouth daily. 06/30/20   [place]ford, Maricela Bo, FNP  lidocaine (LIDODERM) 5 % Place 1 patch onto the skin daily. Remove & Discard patch within 12 hours or as directed by MD 09/28/20 10/28/20  Lattie Haw, MD  losartan (COZAAR) 100 MG tablet Take 1 tablet (100 mg total) by mouth daily. 06/30/20   [place]ford, Maricela Bo, FNP  melatonin 5 MG TABS Take 5 mg by mouth at bedtime as  needed.    [provider]  Misc. Devices (GNP DIGITAL WEIGHT SCALE) MISC 1 each by Does not apply route daily. Use to weight yourself daily. 06/11/20   [name], MD  spironolactone (ALDACTONE) 25 MG tablet Take 0.5 tablets (12.5 mg total) by mouth daily. 06/30/20   Rafael Bihari, FNP  traZODone (DESYREL) 50 MG tablet Take 0.5-1 tablets (25-50 mg total) by mouth at bedtime as needed for sleep. 08/24/20   Gifford Shave, MD    Family History Family History  Problem Relation Age of Onset   Prostate cancer Paternal Uncle    Colon cancer Neg Hx    Colon polyps Neg Hx    Esophageal cancer Neg Hx    Rectal cancer Neg Hx    Stomach cancer Neg Hx     Social History Social History   Tobacco Use   Smoking status: Some Days    Packs/day: 0.25    Years: 20.00    Pack years: 5.00    Types: Cigarettes   Smokeless tobacco: Never   Tobacco comments:    1-2 cigs per day (08/15/14)  Vaping Use   Vaping Use: Never used  Substance Use Topics   Alcohol use: No    Alcohol/week: 0.0 standard drinks    Comment: stopped in april 2014   Drug use: Not Currently    Types: Cocaine     Allergies   Ace inhibitors   Review of Systems Review of Systems  Constitutional:  Negative for chills, fever and unexpected weight change.  Respiratory:  Negative for chest tightness and shortness of breath.   Cardiovascular:  Negative for chest pain and palpitations.  Gastrointestinal:  Negative for abdominal pain, diarrhea, nausea and vomiting.  Genitourinary:  Negative for decreased urine volume, difficulty urinating and frequency.  Musculoskeletal:  Positive for back pain. Negative for arthralgias, gait problem, joint swelling, myalgias, neck pain and neck stiffness.  Skin:  Negative for wound.  Neurological:  Negative for dizziness, tremors, seizures, syncope, facial asymmetry, speech difficulty, weakness, light-headedness, numbness and headaches.  All other systems reviewed and are  negative.   Physical Exam Triage Vital Signs ED Triage Vitals  Enc Vitals Group     BP 09/29/20 1343 (!) 157/113     Pulse Rate 09/29/20 1343 60     Resp --      Temp 09/29/20 1343 98.2 F (36.8 C)     Temp Source 09/29/20 1343 Oral     SpO2 09/29/20 1343 99 %     Weight --      Height --      Head Circumference --      Peak Flow --      Pain Score 09/29/20 1347 0  Pain Loc --      Pain Edu? --      Excl. in [place]? --    No data found.  Updated Vital Signs BP (!) 157/113 (BP Location: Left Arm)   Pulse 60   Temp 98.2 F (36.8 C) (Oral)   SpO2 99%   Visual Acuity Right Eye Distance:   Left Eye Distance:   Bilateral Distance:    Right Eye Near:   Left Eye Near:    Bilateral Near:     Physical Exam Vitals reviewed.  Constitutional:      General: He is not in acute distress.    Appearance: Normal appearance. He is not ill-appearing.  HENT:     Head: Normocephalic and atraumatic.  Cardiovascular:     Rate and Rhythm: Normal rate and regular rhythm.     Heart sounds: Normal heart sounds.  Pulmonary:     Effort: Pulmonary effort is normal.     Breath sounds: Normal breath sounds and air entry.  Abdominal:     Tenderness: There is no abdominal tenderness. There is no right CVA tenderness, left CVA tenderness, guarding or rebound.  Musculoskeletal:     Cervical back: Normal range of motion. No swelling, deformity, signs of trauma, rigidity, spasms, tenderness, bony tenderness or crepitus. No pain with movement.     Thoracic back: No swelling, deformity, signs of trauma, spasms, tenderness or bony tenderness. Normal range of motion. No scoliosis.     Lumbar back: No swelling, deformity, signs of trauma, spasms, tenderness or bony tenderness. Normal range of motion. Negative right straight leg raise test and negative left straight leg raise test. No scoliosis.     Comments:  No midline spinous tenderness, deformity, stepoff.  Absolutely no other injury, deformity,  tenderness, ecchymosis, abrasion.  Neurological:     General: No focal deficit present.     Mental Status: He is alert.     Cranial Nerves: No cranial nerve deficit.  Psychiatric:        Mood and Affect: Mood normal.        Behavior: Behavior normal.        Thought Content: Thought content normal.        Judgment: Judgment normal.     UC Treatments / Results  Labs (all labs ordered are listed, but only abnormal results are displayed) Labs Reviewed - No data to display  EKG   Radiology No results found.  Procedures Procedures (including critical care time)  Medications Ordered in UC Medications - No data to display  Initial Impression / Assessment and Plan / UC Course  I have reviewed the triage vital signs and the nursing notes.  Pertinent labs & imaging results that were available during my care of the patient were reviewed by me and considered in my medical decision making (see chart for details).     This patient is a very pleasant 60 y.o. year old male presenting for work note- back pain. Cleared to return to work. No red flag symptoms.  For elevated blood pressure, patient states this is related to stress, his blood pressure was in normal range 1 month ago.  Continue current regimen, monitor blood pressure at home. Follow-up with PCP for chronic conditions. ED return precautions discussed. Patient verbalizes understanding and agreement.   Final Clinical Impressions(s) / UC Diagnoses   Final diagnoses:  Essential hypertension  Encounter for work capability assessment     Discharge Instructions      -Please check your  blood pressure at home or at the pharmacy. If this continues to be >140/90, follow-up with your primary care provider for further blood pressure management/ medication titration. If you develop chest pain, shortness of breath, vision changes, the worst headache of your life- head straight to the ED or call 911.    ED Prescriptions   None     PDMP not reviewed this encounter.   Hazel Sams, PA-C 09/29/20 1444

## 2020-10-01 ENCOUNTER — Ambulatory Visit: Payer: Medicaid Other | Admitting: Podiatrist

## 2020-10-06 ENCOUNTER — Ambulatory Visit: Payer: Medicaid Other | Attending: Family Medicine | Admitting: Physical Therapy

## 2020-10-09 ENCOUNTER — Encounter (HOSPITAL_COMMUNITY): Payer: Medicaid Other | Admitting: Internal Medicine

## 2020-10-15 ENCOUNTER — Ambulatory Visit: Payer: Medicaid Other | Admitting: Podiatrist

## 2020-10-27 ENCOUNTER — Other Ambulatory Visit: Payer: Self-pay

## 2020-10-27 ENCOUNTER — Ambulatory Visit: Payer: Medicaid Other | Admitting: Podiatry

## 2020-10-27 ENCOUNTER — Encounter: Payer: Self-pay | Admitting: Podiatry

## 2020-10-27 DIAGNOSIS — L84 Corns and callosities: Secondary | ICD-10-CM

## 2020-10-27 DIAGNOSIS — Q828 Other specified congenital malformations of skin: Secondary | ICD-10-CM | POA: Diagnosis not present

## 2020-10-27 NOTE — Progress Notes (Signed)
  Subjective:  Patient ID: Charles Cervantes, male    DOB: [birth year],   MRN: [id number]  Chief Complaint  Patient presents with   Callouses    2 callus noted to the medial and lateral aspect of the left foot causing an increase in pain. Patient states it feels like he is walking on glass or rocks.     60 y.o. male presents for calluses. Relates he feels like he is walking on rocks. States he has had them trimmed in the past by Dr. Rolley Sims. Hoping to have them trimmed today.  . Denies any other pedal complaints. Denies n/v/f/c.   Past Medical History:  Diagnosis Date   Anxiety    CHF (congestive heart failure) (HCC)    takes Furosemide daily   Chronic back pain    Constipation    takes Colace daily   COPD (chronic obstructive pulmonary disease) (HCC)    Spiriva daily   Depression    takes Effexor daily and Risperdal nightly   Dizziness    H/O blood clots 01/03/2010   in leg   Headache    occasionally   Hyperlipidemia    Hypertension    takes Losartan and Coreg daily   Muscle spasm    takes Zizanidine daily as needed   Shortness of breath dyspnea    more with exertion but sometimes notices with lying/sitting   Sleep apnea    Substance abuse (HCC)    Weakness    tingling and numbness    Objective:  Physical Exam: Vascular: DP/PT pulses 2/4 bilateral. CFT <3 seconds. Normal hair growth on digits. No edema.  Skin. No lacerations or abrasions bilateral feet. Hyperkeratotic tissue noted bilateral fifth metararsals, sub left first metatarsal and right medial second digit.  Musculoskeletal: MMT 5/5 bilateral lower extremities in DF, PF, Inversion and Eversion. Deceased ROM in DF of ankle joint.  Neurological: Sensation intact to light touch.   Assessment:   1. Porokeratosis   2. Corns and callus      Plan:  Patient was evaluated and treated and all questions answered. -Discussed porokeratosis corns and calluses with patient and treatment options.  -Hyperkeratotic tissue  was debrided with chisel without incident.  -Applied salycylic acid treatment to area with dressing. Advised to remove bandaging tomorrow.  -Encouraged daily moisturizing -Discussed use of pumice stone -Advised good supportive shoes and inserts -Patient to return to office as needed or sooner if condition worsens.   Lorenda Peck, DPM

## 2020-10-30 ENCOUNTER — Other Ambulatory Visit (HOSPITAL_COMMUNITY): Payer: Self-pay

## 2020-10-30 DIAGNOSIS — I5022 Chronic systolic (congestive) heart failure: Secondary | ICD-10-CM

## 2020-10-30 MED ORDER — SPIRONOLACTONE 25 MG PO TABS: 12.5000 mg | ORAL_TABLET | Freq: Every day | ORAL | 3 refills | Status: DC

## 2020-12-01 ENCOUNTER — Telehealth (HOSPITAL_COMMUNITY): Payer: Self-pay

## 2020-12-01 NOTE — Telephone Encounter (Signed)
Called to confirm/remind patient of their appointment at the [facility] on 12/02/20.   Patient reminded to bring all medications and/or complete list.  Confirmed patient has transportation. Gave directions, instructed to utilize [place] parking.  Confirmed appointment prior to ending call.

## 2020-12-01 NOTE — Telephone Encounter (Signed)
Error

## 2020-12-02 ENCOUNTER — Other Ambulatory Visit: Payer: Self-pay

## 2020-12-02 ENCOUNTER — Encounter (HOSPITAL_COMMUNITY): Payer: Self-pay

## 2020-12-02 ENCOUNTER — Ambulatory Visit (HOSPITAL_COMMUNITY)
Admission: RE | Admit: 2020-12-02 | Discharge: 2020-12-02 | Disposition: A | Discharge status: Home / Self Care | Payer: Medicaid Other | Source: Ambulatory Visit | Attending: Family Medicine | Admitting: Family Medicine

## 2020-12-02 VITALS — BP 156/82 | HR 61 | Wt 188.0 lb

## 2020-12-02 DIAGNOSIS — Z79899 Other long term (current) drug therapy: Secondary | ICD-10-CM | POA: Insufficient documentation

## 2020-12-02 DIAGNOSIS — I428 Other cardiomyopathies: Secondary | ICD-10-CM | POA: Diagnosis not present

## 2020-12-02 DIAGNOSIS — F17210 Nicotine dependence, cigarettes, uncomplicated: Secondary | ICD-10-CM | POA: Insufficient documentation

## 2020-12-02 DIAGNOSIS — G4733 Obstructive sleep apnea (adult) (pediatric): Secondary | ICD-10-CM | POA: Diagnosis not present

## 2020-12-02 DIAGNOSIS — I110 Hypertensive heart disease with heart failure: Secondary | ICD-10-CM | POA: Diagnosis not present

## 2020-12-02 DIAGNOSIS — F1411 Cocaine abuse, in remission: Secondary | ICD-10-CM | POA: Insufficient documentation

## 2020-12-02 DIAGNOSIS — Z7984 Long term (current) use of oral hypoglycemic drugs: Secondary | ICD-10-CM | POA: Insufficient documentation

## 2020-12-02 DIAGNOSIS — F17200 Nicotine dependence, unspecified, uncomplicated: Secondary | ICD-10-CM

## 2020-12-02 DIAGNOSIS — Z91199 Patient's noncompliance with other medical treatment and regimen due to unspecified reason: Secondary | ICD-10-CM | POA: Diagnosis not present

## 2020-12-02 DIAGNOSIS — R42 Dizziness and giddiness: Secondary | ICD-10-CM | POA: Insufficient documentation

## 2020-12-02 DIAGNOSIS — I5022 Chronic systolic (congestive) heart failure: Secondary | ICD-10-CM | POA: Diagnosis not present

## 2020-12-02 DIAGNOSIS — F1910 Other psychoactive substance abuse, uncomplicated: Secondary | ICD-10-CM

## 2020-12-02 DIAGNOSIS — I10 Essential (primary) hypertension: Secondary | ICD-10-CM

## 2020-12-02 DIAGNOSIS — F1011 Alcohol abuse, in remission: Secondary | ICD-10-CM | POA: Insufficient documentation

## 2020-12-02 LAB — BASIC METABOLIC PANEL
Anion gap: 7 (ref 5–15)
BUN: 15 mg/dL (ref 6–20)
CO2: 24 mmol/L (ref 22–32)
Calcium: 8.7 mg/dL — ABNORMAL LOW (ref 8.9–10.3)
Chloride: 104 mmol/L (ref 98–111)
Creatinine, Ser: 1.12 mg/dL (ref 0.61–1.24)
GFR, Estimated: >60 mL/min (ref >60)
Glucose, Bld: 102 mg/dL — ABNORMAL HIGH (ref 70–99)
Potassium: 4.2 mmol/L (ref 3.5–5.1)
Sodium: 135 mmol/L (ref 135–145)

## 2020-12-02 MED ORDER — FUROSEMIDE 40 MG PO TABS: 60.0000 mg | ORAL_TABLET | Freq: Every day | ORAL | 3 refills | Status: DC

## 2020-12-02 NOTE — Progress Notes (Signed)
ReDS Vest / Clip - 12/02/20 1300       ReDS Vest / Clip   Station Marker C    Ruler Value 28    ReDS Value Range Moderate volume overload    ReDS Actual Value 39

## 2020-12-02 NOTE — Patient Instructions (Signed)
Thank you for coming in today  Labs were done today, if any labs are abnormal the clinic will call   INCREASE Lasix to 60 mg 1 and 1/2 tablet daily   LIMIT FLUIDS to 2 liters/64 ounces daily   Your physician recommends that you schedule a follow-up appointment in: 4 week and in 4 months with [name]  Your physician recommends that you return for lab work in: 10 days  At the [facility], you and your health needs are our priority. As part of our continuing mission to provide you with exceptional heart care, we have created designated Provider Care Teams. These Care Teams include your primary Cardiologist (physician) and Advanced Practice Providers (APPs- Physician Assistants and Nurse Practitioners) who all work together to provide you with the care you need, when you need it.   You may see any of the following providers on your designated Care Team at your next follow up: Dr Glori Bickers Dr Haynes Kerns, NP Lyda Jester, [place] [facility] [place], [place] Audry Riles, PharmD   Please be sure to bring in all your medications bottles to every appointment.   If you have any questions or concerns before your next appointment please send [place] a message through [place] or call our office at [phone].    TO LEAVE A MESSAGE FOR THE NURSE SELECT OPTION 2, PLEASE LEAVE A MESSAGE INCLUDING: YOUR NAME DATE OF BIRTH CALL BACK NUMBER REASON FOR CALL**this is important as we prioritize the call backs  YOU WILL RECEIVE A CALL BACK THE SAME DAY AS LONG AS YOU CALL BEFORE 4:00 PM

## 2020-12-02 NOTE — Progress Notes (Addendum)
ADVANCED HF CLINIC NOTE  Patient ID: Charles Cervantes, male   DOB: August 22, 1960, 60 y.o.   MRN: [id number]  PCP: [name], MD HF Cardiologist: [name]  HPI: [name] is a 60 y.o. male with systolic heart failure diagnosed in 04/2012 in [place] secondary to NICM (normal cors on cath), EF 20-25%. As well as polysubstance abuse (cocaine, tobacco and alcohol)  He moved from [facility] and was admitted to [facility] in 4/14 with progressive dyspnea and orthopnea.  Echo on admit EF 20-25% with normal RV.  Discharge weight 147 pounds.  He also had NSVT while in house and we continued his Lifevest placed in [place].  Admitted To [facility] in 8/14 with HF. EF reported 50-55% on echo. On 10/2012 bedside echo in our HF Clinic EF 35-40%. In 11/2012 presented to clinic c/o recurrent CP and severe HF symptoms but seemed well compensated on exam R/L cath showed normal cors.   He was lost to f/u in 2016.   Readmitted to [facility] 9/14-17/21 with severe HTN and recurrent CP anfd HF. Reported he was taking his HF meds except his fluid pill. BP on arrival 163/118. Diuresed and BP controlled.   UDS + for cocaine, THC and opiates.   Echo 09/18/19 EF 20-25% RV mildly HK   Post hospital f/u 9/21 enrolled back in HF Clinic (previously discharged due to inappropriate behavior towards staff).  Been in two treatment centers in [place] at [facility] 2/9-03/11/20. Then relapsed and went to [facility] 3/22.   Today he returns for HF follow up. He has been more SOB walking on flat ground, and has some dizziness when bending over. Denies palpitations, CP, edema. +PND/Orthopnea. Appetite ok. No fever or chills. Weight at home 183 pounds. Taking all medications. 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 Preparing to take drivers license test soon. Remains drug free since 12/18/19. Drinking a lot of Body Armor electrolyte drinks. Not wearing CPAP, smoking 1/2 ppd cigarettes says he mostly "wastes" them.  ECHO  04/2012 EF  20-25% 07/17/12 EF 30-35% 08/2012 EF 50-55% at [facility] 02/27/13 EF 35-40% 7/22 EF 40-45%  RHC/LHC  11/20/12  RA = 6  RV = 33/3/8  PA = 29/9 (18)  PCW = 8  Fick cardiac output/index = 6.4/3.2  PVR = 1.5 WU  FA sat = 95%  PA sat = 72%, 73%  SVC sat 72% Extensive calcification in proximal and mid LAD with only mild intraluminal stenosis with 20-30% stenosis.   CPX 2/15 FVC 2.90 (62%)  FEV1 2.22 (60%) FEV1/FVC 76%  Resting HR: 61 Peak HR: 111 (66% age predicted max HR) BP rest: 146/90 BP peak: 168/86 Peak VO2: 22.6 (64.1% predicted peak VO2) VE/VCO2 slope: 29.4 OUES: 2.49 Peak RER: 0.90  Labs 06/29/12 Potassium 5.4 Creatinine 1.05 07/17/12 Dig Level 0.5 Potassium 4.0 Creatinine 0.95 Pro BNP 244 11/20/12 K 4.0 Creatinine 1.18 12/18/12: K 4.7, Cr 0.96 05/16/13 K 4.5 Creatinine 0.91  ROS: All systems negative except as listed in HPI, PMH and Problem List.  Past Medical History:  Diagnosis Date   Anxiety    CHF (congestive heart failure) (HCC)    takes Furosemide daily   Chronic back pain    Constipation    takes Colace daily   COPD (chronic obstructive pulmonary disease) (HCC)    Spiriva daily   Depression    takes Effexor daily and Risperdal nightly   Dizziness    H/O blood clots 01/03/2010   in leg   Headache  occasionally   Hyperlipidemia    Hypertension    takes Losartan and Coreg daily   Muscle spasm    takes Zizanidine daily as needed   Shortness of breath dyspnea    more with exertion but sometimes notices with lying/sitting   Sleep apnea    Substance abuse (HCC)    Weakness    tingling and numbness   Current Outpatient Medications  Medication Sig Dispense Refill   carvedilol (COREG) 6.25 MG tablet Take 1 tablet (6.25 mg total) by mouth 2 (two) times daily with a meal. 60 tablet 3   dapagliflozin propanediol (FARXIGA) 10 MG TABS tablet Take 1 tablet (10 mg total) by mouth daily before breakfast. 30 tablet 6   furosemide (LASIX) 40 MG tablet Take 1  tablet (40 mg total) by mouth daily. 30 tablet 3   isosorbide mononitrate (IMDUR) 60 MG 24 hr tablet Take 1 tablet (60 mg total) by mouth daily. 30 tablet 3   losartan (COZAAR) 100 MG tablet Take 1 tablet (100 mg total) by mouth daily. 30 tablet 3   spironolactone (ALDACTONE) 25 MG tablet Take 0.5 tablets (12.5 mg total) by mouth daily. Appointment Required For Further Refills [phone] 15 tablet 3   acetaminophen (TYLENOL) 500 MG tablet Take 2 tablets (1,000 mg total) by mouth every 6 (six) hours as needed. 30 tablet 0   diclofenac Sodium (VOLTAREN) 1 % GEL Apply 2 g topically 4 (four) times daily. 50 g 1   ibuprofen (ADVIL) 400 MG tablet Take 400 mg by mouth every 8 (eight) hours as needed.     melatonin 5 MG TABS Take 5 mg by mouth at bedtime as needed.     Misc. Devices (GNP DIGITAL WEIGHT SCALE) MISC 1 each by Does not apply route daily. Use to weight yourself daily. 1 each 0   No current facility-administered medications for this encounter.   Social History   Tobacco Use   Smoking status: Some Days    Packs/day: 0.25    Years: 20.00    Pack years: 5.00    Types: Cigarettes   Smokeless tobacco: Never   Tobacco comments:    1-2 cigs per day (08/15/14)  Vaping Use   Vaping Use: Never used  Substance Use Topics   Alcohol use: No    Alcohol/week: 0.0 standard drinks    Comment: stopped in april 2014   Drug use: Not Currently    Types: Cocaine   FHx: - Mom died (63s) in MVA when he was 71 months old - Dad murdered (75yo) when he was in 7th grade  - No FHx of HF  BP (!) 156/82   Pulse 61   Wt 85.3 kg (188 lb)   SpO2 97%   BMI 24.47 kg/m   Wt Readings from Last 3 Encounters:  12/02/20 85.3 kg (188 lb)  09/28/20 85.7 kg (189 lb)  08/24/20 86.8 kg (191 lb 6.4 oz)   PHYSICAL EXAM: General:  NAD. No resp difficulty HEENT: Normal Neck: Supple. No JVD. Carotids 2+ bilat; no bruits. No lymphadenopathy or thryomegaly appreciated. Cor: PMI nondisplaced. Regular rate &  rhythm. No rubs, gallops or murmurs. Lungs: Clear, diminished LLL Abdomen: Soft, nontender, nondistended. No hepatosplenomegaly. No bruits or masses. Good bowel sounds. Extremities: No cyanosis, clubbing, rash, edema Neuro: Alert & oriented x 3, cranial nerves grossly intact. Moves all 4 extremities w/o difficulty. Affect pleasant.  ReDs: 39%  ECG: SR with LVH (personally reviewed)  ASSESSMENT & PLAN:  1) Chronic systolic  HF/NICM:  - Echo 02/2013 EF ~35-40% - NYHA II symptoms.  - Cath 11/14 normal cors - Echo 9/21 EF 20-25% RV mildly HK in setting of severe HTN - Echo 7/22 EF 40-45%, RV ok. - NYHA III-IIIb symptoms, volume looks OK on exam, but ReDs 39%. - Increase Lasix to 60 mg daily.  - Continue Farxiga 10 daily. - Continue carvedilol 6.25 mg bid. - Continue losartan 100 daily (no Entresto due to angiodema with ACEi). - Continue spiro 12.5 mg daily. Won't increase further with borderline high K. - Continue Imdur 60 mg daily. - BMET today, repeat in 10 days.  2) Polysubstance abuse - At [place] abstinent, congratulated on almost 1 year sober!  3) Current smoker - Encouraged cessation.  4) HTN - Elevated today. - Diurese as above. - May need hydralazine if agreeable to tid med.  5) OSA  - Per Dr Elsworth Soho. Not using CPAP.  - Encouraged compliance.   Follow up with APP in 4 weeks to recheck BP and volume, and with [name] in 4 months.  [place], FNP 12/02/20

## 2020-12-14 ENCOUNTER — Other Ambulatory Visit (HOSPITAL_COMMUNITY): Payer: Medicaid Other

## 2020-12-24 ENCOUNTER — Other Ambulatory Visit (HOSPITAL_COMMUNITY): Payer: Self-pay

## 2020-12-24 MED ORDER — CARVEDILOL 6.25 MG PO TABS: 6.2500 mg | ORAL_TABLET | Freq: Two times a day (BID) | ORAL | 3 refills | Status: DC

## 2020-12-25 NOTE — Progress Notes (Incomplete)
ADVANCED HF CLINIC NOTE  Patient ID: REGGIE BISE, male   DOB: [birth year], 60 y.o.   MRN: [id number]  PCP: [name], MD HF Cardiologist: [name]  HPI: Mr Udell is a 60 y.o. male with systolic heart failure diagnosed in 04/2012 in [place] secondary to NICM (normal cors on cath), EF 20-25%. As well as polysubstance abuse (cocaine, tobacco and alcohol)  He moved from [facility] and was admitted to [facility] in 4/14 with progressive dyspnea and orthopnea.  Echo on admit EF 20-25% with normal RV.  Discharge weight 147 pounds.  He also had NSVT while in house and we continued his Lifevest placed in [place].  Admitted To [facility] in 8/14 with HF. EF reported 50-55% on echo. On 10/2012 bedside echo in our HF Clinic EF 35-40%. In 11/2012 presented to clinic c/o recurrent CP and severe HF symptoms but seemed well compensated on exam R/L cath showed normal cors.   He was lost to f/u in 2016.   Readmitted to [facility] 9/14-17/21 with severe HTN and recurrent CP anfd HF. Reported he was taking his HF meds except his fluid pill. BP on arrival 163/118. Diuresed and BP controlled.   UDS + for cocaine, THC and opiates.   Echo 09/18/19 EF 20-25% RV mildly HK   Post hospital f/u 9/21 enrolled back in HF Clinic (previously discharged due to inappropriate behavior towards staff).  Been in two treatment centers in [place] at [facility] 2/9-03/11/20. Then relapsed and went to [facility] 3/22.   Today he returns for HF follow up. He has been more SOB walking on flat ground, and has some dizziness when bending over. Denies palpitations, CP, edema. +PND/Orthopnea. Appetite ok. No fever or chills. Weight at home 183 pounds. Taking all medications. Working part time stocking. Now at [employer]. Preparing to take drivers license test soon. Remains drug free since 12/18/19. Drinking a lot of Body Armor electrolyte drinks. Not wearing CPAP, smoking 1/2 ppd cigarettes says he mostly "wastes" them.  ECHO  04/2012 EF  20-25% 07/17/12 EF 30-35% 08/2012 EF 50-55% at [facility] 02/27/13 EF 35-40% 7/22 EF 40-45%  RHC/LHC  11/20/12  RA = 6  RV = 33/3/8  PA = 29/9 (18)  PCW = 8  Fick cardiac output/index = 6.4/3.2  PVR = 1.5 WU  FA sat = 95%  PA sat = 72%, 73%  SVC sat 72% Extensive calcification in proximal and mid LAD with only mild intraluminal stenosis with 20-30% stenosis.   CPX 2/15 FVC 2.90 (62%)  FEV1 2.22 (60%) FEV1/FVC 76%  Resting HR: 61 Peak HR: 111 (66% age predicted max HR) BP rest: 146/90 BP peak: 168/86 Peak VO2: 22.6 (64.1% predicted peak VO2) VE/VCO2 slope: 29.4 OUES: 2.49 Peak RER: 0.90  Labs 06/29/12 Potassium 5.4 Creatinine 1.05 07/17/12 Dig Level 0.5 Potassium 4.0 Creatinine 0.95 Pro BNP 244 11/20/12 K 4.0 Creatinine 1.18 12/18/12: K 4.7, Cr 0.96 05/16/13 K 4.5 Creatinine 0.91  ROS: All systems negative except as listed in HPI, PMH and Problem List.  Past Medical History:  Diagnosis Date   Anxiety    CHF (congestive heart failure) (HCC)    takes Furosemide daily   Chronic back pain    Constipation    takes Colace daily   COPD (chronic obstructive pulmonary disease) (HCC)    Spiriva daily   Depression    takes Effexor daily and Risperdal nightly   Dizziness    H/O blood clots 01/03/2010   in leg   Headache  occasionally   Hyperlipidemia    Hypertension    takes Losartan and Coreg daily   Muscle spasm    takes Zizanidine daily as needed   Shortness of breath dyspnea    more with exertion but sometimes notices with lying/sitting   Sleep apnea    Substance abuse (HCC)    Weakness    tingling and numbness   Current Outpatient Medications  Medication Sig Dispense Refill   acetaminophen (TYLENOL) 500 MG tablet Take 2 tablets (1,000 mg total) by mouth every 6 (six) hours as needed. 30 tablet 0   carvedilol (COREG) 6.25 MG tablet Take 1 tablet (6.25 mg total) by mouth 2 (two) times daily with a meal. 60 tablet 3   dapagliflozin propanediol (FARXIGA) 10 MG TABS  tablet Take 1 tablet (10 mg total) by mouth daily before breakfast. 30 tablet 6   furosemide (LASIX) 40 MG tablet Take 1.5 tablets (60 mg total) by mouth daily. 30 tablet 3   ibuprofen (ADVIL) 400 MG tablet Take 400 mg by mouth every 8 (eight) hours as needed.     isosorbide mononitrate (IMDUR) 60 MG 24 hr tablet Take 1 tablet (60 mg total) by mouth daily. 30 tablet 3   losartan (COZAAR) 100 MG tablet Take 1 tablet (100 mg total) by mouth daily. 30 tablet 3   Misc. Devices (GNP DIGITAL WEIGHT SCALE) MISC 1 each by Does not apply route daily. Use to weight yourself daily. 1 each 0   spironolactone (ALDACTONE) 25 MG tablet Take 0.5 tablets (12.5 mg total) by mouth daily. Appointment Required For Further Refills [phone] 15 tablet 3   No current facility-administered medications for this visit.   Social History   Tobacco Use   Smoking status: Some Days    Packs/day: 0.25    Years: 20.00    Pack years: 5.00    Types: Cigarettes   Smokeless tobacco: Never   Tobacco comments:    1-2 cigs per day (08/15/14)  Vaping Use   Vaping Use: Never used  Substance Use Topics   Alcohol use: No    Alcohol/week: 0.0 standard drinks    Comment: stopped in april 2014   Drug use: Not Currently    Types: Cocaine   FHx: - Mom died (56s) in MVA when he was 72 months old - Dad murdered (51yo) when he was in 7th grade  - No FHx of HF  There were no vitals taken for this visit.  Wt Readings from Last 3 Encounters:  12/02/20 85.3 kg (188 lb)  09/28/20 85.7 kg (189 lb)  08/24/20 86.8 kg (191 lb 6.4 oz)   PHYSICAL EXAM: General:  NAD. No resp difficulty HEENT: Normal Neck: Supple. No JVD. Carotids 2+ bilat; no bruits. No lymphadenopathy or thryomegaly appreciated. Cor: PMI nondisplaced. Regular rate & rhythm. No rubs, gallops or murmurs. Lungs: Clear, diminished LLL Abdomen: Soft, nontender, nondistended. No hepatosplenomegaly. No bruits or masses. Good bowel sounds. Extremities: No cyanosis,  clubbing, rash, edema Neuro: Alert & oriented x 3, cranial nerves grossly intact. Moves all 4 extremities w/o difficulty. Affect pleasant.  ReDs: 39%  ECG: SR with LVH (personally reviewed)  ASSESSMENT & PLAN:  1) Chronic systolic HF/NICM:  - Echo 02/2013 EF ~35-40% - NYHA II symptoms.  - Cath 11/14 normal cors - Echo 9/21 EF 20-25% RV mildly HK in setting of severe HTN - Echo 7/22 EF 40-45%, RV ok. - NYHA III-IIIb symptoms, volume looks OK on exam, but ReDs 39%. - Increase Lasix  to 60 mg daily.  - Continue Farxiga 10 daily. - Continue carvedilol 6.25 mg bid. - Continue losartan 100 daily (no Entresto due to angiodema with ACEi). - Continue spiro 12.5 mg daily. Won't increase further with borderline high K. - Continue Imdur 60 mg daily. - BMET today, repeat in 10 days.  2) Polysubstance abuse - At [place] abstinent, congratulated on almost 1 year sober!  3) Current smoker - Encouraged cessation.  4) HTN - Elevated today. - Diurese as above. - May need hydralazine if agreeable to tid med.  5) OSA  - Per Dr Elsworth Soho. Not using CPAP.  - Encouraged compliance.   Follow up with APP in 4 weeks to recheck BP and volume, and with [name] in 4 months.  [place], FNP 12/25/20

## 2020-12-29 ENCOUNTER — Telehealth (HOSPITAL_COMMUNITY): Payer: Self-pay

## 2020-12-29 NOTE — Telephone Encounter (Signed)
Called to confirm/remind patient of their appointment at the [place] [facility] on 12/30/20. However patient canceled because he has a emergency dental visit on tomorrow and will give our office a call back to get rescheduled.

## 2020-12-30 ENCOUNTER — Encounter (HOSPITAL_COMMUNITY): Payer: Medicaid Other

## 2021-01-22 ENCOUNTER — Telehealth (HOSPITAL_COMMUNITY): Payer: Self-pay

## 2021-01-22 NOTE — Telephone Encounter (Signed)
Called to confirm/remind patient of their appointment at the [facility] on 01/26/20.   Patient reminded to bring all medications and/or complete list.  Confirmed patient has transportation. Gave directions, instructed to utilize [place] parking.  Confirmed appointment prior to ending call.

## 2021-01-25 ENCOUNTER — Ambulatory Visit (HOSPITAL_COMMUNITY)
Admission: RE | Admit: 2021-01-25 | Discharge: 2021-01-25 | Disposition: A | Discharge status: Home / Self Care | Payer: Medicaid Other | Source: Ambulatory Visit | Attending: Family Medicine | Admitting: Family Medicine

## 2021-01-25 ENCOUNTER — Encounter (HOSPITAL_COMMUNITY): Payer: Self-pay

## 2021-01-25 ENCOUNTER — Other Ambulatory Visit: Payer: Self-pay

## 2021-01-25 ENCOUNTER — Telehealth (HOSPITAL_COMMUNITY): Payer: Self-pay

## 2021-01-25 VITALS — BP 154/96 | HR 59 | Wt 187.6 lb

## 2021-01-25 DIAGNOSIS — I428 Other cardiomyopathies: Secondary | ICD-10-CM | POA: Diagnosis not present

## 2021-01-25 DIAGNOSIS — G4733 Obstructive sleep apnea (adult) (pediatric): Secondary | ICD-10-CM | POA: Diagnosis not present

## 2021-01-25 DIAGNOSIS — I10 Essential (primary) hypertension: Secondary | ICD-10-CM

## 2021-01-25 DIAGNOSIS — E785 Hyperlipidemia, unspecified: Secondary | ICD-10-CM | POA: Diagnosis not present

## 2021-01-25 DIAGNOSIS — J449 Chronic obstructive pulmonary disease, unspecified: Secondary | ICD-10-CM | POA: Diagnosis not present

## 2021-01-25 DIAGNOSIS — Z79899 Other long term (current) drug therapy: Secondary | ICD-10-CM | POA: Insufficient documentation

## 2021-01-25 DIAGNOSIS — I110 Hypertensive heart disease with heart failure: Secondary | ICD-10-CM | POA: Insufficient documentation

## 2021-01-25 DIAGNOSIS — F17200 Nicotine dependence, unspecified, uncomplicated: Secondary | ICD-10-CM | POA: Diagnosis not present

## 2021-01-25 DIAGNOSIS — F1910 Other psychoactive substance abuse, uncomplicated: Secondary | ICD-10-CM | POA: Diagnosis not present

## 2021-01-25 DIAGNOSIS — I5022 Chronic systolic (congestive) heart failure: Secondary | ICD-10-CM | POA: Diagnosis present

## 2021-01-25 LAB — BASIC METABOLIC PANEL
Anion gap: 7 (ref 5–15)
BUN: 12 mg/dL (ref 6–20)
CO2: 27 mmol/L (ref 22–32)
Calcium: 9.3 mg/dL (ref 8.9–10.3)
Chloride: 107 mmol/L (ref 98–111)
Creatinine, Ser: 1.14 mg/dL (ref 0.61–1.24)
GFR, Estimated: >60 mL/min (ref >60)
Glucose, Bld: 120 mg/dL — ABNORMAL HIGH (ref 70–99)
Potassium: 4.7 mmol/L (ref 3.5–5.1)
Sodium: 141 mmol/L (ref 135–145)

## 2021-01-25 LAB — BRAIN NATRIURETIC PEPTIDE: B Natriuretic Peptide: 37.1 pg/mL (ref 0.0–100.0)

## 2021-01-25 MED ORDER — ISOSORBIDE MONONITRATE ER 60 MG PO TB24: 90.0000 mg | ORAL_TABLET | Freq: Every day | ORAL | 3 refills | Status: DC

## 2021-01-25 NOTE — Patient Instructions (Addendum)
Thank you for coming in today  Labs were done today (BMET BNP), if any labs are abnormal the clinic will call you  INCREASE Imdur 90 mg 1 1/2 tablets daily.... if you get a headache please call the clinic  [phone]  INCREASE Lasix to 60 mg 1 1/2 tablets in the morning and 40 mg 1 tablet in the evening for 3 days then go back to taking 60 mg 1 1/2 tablets daily   Your physician recommends that you schedule a follow-up appointment in: keep scheduled appointment with [name]  Your physician recommends that you return for lab work in: in 2 weeks for BMET 02/08/2021 at 945 am  At the [facility], you and your health needs are our priority. As part of our continuing mission to provide you with exceptional heart care, we have created designated Provider Care Teams. These Care Teams include your primary Cardiologist (physician) and Advanced Practice Providers (APPs- Physician Assistants and Nurse Practitioners) who all work together to provide you with the care you need, when you need it.   You may see any of the following providers on your designated Care Team at your next follow up: Dr Glori Bickers Dr Haynes Kerns, NP Lyda Jester, [place] [facility] [place], [place] Audry Riles, PharmD   Please be sure to bring in all your medications bottles to every appointment.   If you have any questions or concerns before your next appointment please send [place] a message through [place] or call our office at [phone].    TO LEAVE A MESSAGE FOR THE NURSE SELECT OPTION 2, PLEASE LEAVE A MESSAGE INCLUDING: YOUR NAME DATE OF BIRTH CALL BACK NUMBER REASON FOR CALL**this is important as we prioritize the call backs  YOU WILL RECEIVE A CALL BACK THE SAME DAY AS LONG AS YOU CALL BEFORE 4:00 PM

## 2021-01-25 NOTE — Progress Notes (Addendum)
ADVANCED HF CLINIC NOTE  Patient ID: Charles Cervantes, male   DOB: 1960/07/15, 61 y.o.   MRN: [id number]  PCP: [name], MD HF Cardiologist: [name]  HPI: Charles Cervantes is a 61 y.o. male with systolic heart failure diagnosed in 04/2012 in [place] secondary to NICM (normal cors on cath), EF 20-25%. As well as polysubstance abuse (cocaine, tobacco and alcohol)  He moved from [facility] and was admitted to [facility] in 4/14 with progressive dyspnea and orthopnea.  Echo on admit EF 20-25% with normal RV.  Discharge weight 147 pounds.  He also had NSVT while in house and we continued his Lifevest placed in [place].  Admitted To [facility] in 8/14 with HF. EF reported 50-55% on echo. On 10/2012 bedside echo in our HF Clinic EF 35-40%. In 11/2012 presented to clinic c/o recurrent CP and severe HF symptoms but seemed well compensated on exam R/L cath showed normal cors.   He was lost to f/u in 2016.   Readmitted to [facility] 9/14-17/21 with severe HTN and recurrent CP anfd HF. Reported he was taking his HF meds except his fluid pill. BP on arrival 163/118. Diuresed and BP controlled.   UDS + for cocaine, THC and opiates.   Echo 09/18/19 EF 20-25% RV mildly HK   Post hospital f/u 9/21 enrolled back in HF Clinic (previously discharged due to inappropriate behavior towards staff).  Been in two treatment centers in [place] at [facility] 2/9-03/11/20. Then relapsed and went to [facility] 3/22.   Follow up 11/22 mildly volume up, lasix increased to 60 mg daily. BP elevated. He cancelled his follow up.  Today he returns for HF follow up. He is more SOB x 1 week and + orthopnea. He tells me he can walk on flat ground OK and can go up stairs, but has to take his time. He is having some light headedness, no falls.  Denies CP, palpitations, edema, or abnormal bleeding. Appetite ok. No fever or chills. Weight at home 182 pounds. Taking all medications. Drinking a lot of fluid and says he is probably eating more salty foods.  Working part time. Smoking 1/2 ppd. Continues to live at [facility]. He is planning to take his driver's license test after he gets his own place. He remains drug-free.  ECHO  04/2012 EF 20-25% 07/17/12 EF 30-35% 08/2012 EF 50-55% at [facility] 02/27/13 EF 35-40% 7/22 EF 40-45%  RHC/LHC  11/20/12  RA = 6  RV = 33/3/8  PA = 29/9 (18)  PCW = 8  Fick cardiac output/index = 6.4/3.2  PVR = 1.5 WU  FA sat = 95%  PA sat = 72%, 73%  SVC sat 72% Extensive calcification in proximal and mid LAD with only mild intraluminal stenosis with 20-30% stenosis.   CPX 2/15 FVC 2.90 (62%)  FEV1 2.22 (60%) FEV1/FVC 76%  Resting HR: 61 Peak HR: 111 (66% age predicted max HR) BP rest: 146/90 BP peak: 168/86 Peak VO2: 22.6 (64.1% predicted peak VO2) VE/VCO2 slope: 29.4 OUES: 2.49 Peak RER: 0.90  Labs 06/29/12 Potassium 5.4 Creatinine 1.05 07/17/12 Dig Level 0.5 Potassium 4.0 Creatinine 0.95 Pro BNP 244 11/20/12 K 4.0 Creatinine 1.18 12/18/12: K 4.7, Cr 0.96 05/16/13 K 4.5 Creatinine 0.91  ROS: All systems negative except as listed in HPI, PMH and Problem List.  Past Medical History:  Diagnosis Date   Anxiety    CHF (congestive heart failure) (HCC)    takes Furosemide daily   Chronic back pain  Constipation    takes Colace daily   COPD (chronic obstructive pulmonary disease) (HCC)    Spiriva daily   Depression    takes Effexor daily and Risperdal nightly   Dizziness    H/O blood clots 01/03/2010   in leg   Headache    occasionally   Hyperlipidemia    Hypertension    takes Losartan and Coreg daily   Muscle spasm    takes Zizanidine daily as needed   Shortness of breath dyspnea    more with exertion but sometimes notices with lying/sitting   Sleep apnea    Substance abuse (HCC)    Weakness    tingling and numbness   Current Outpatient Medications  Medication Sig Dispense Refill   acetaminophen (TYLENOL) 500 MG tablet Take 2 tablets (1,000 mg total) by mouth every 6 (six) hours  as needed. 30 tablet 0   carvedilol (COREG) 6.25 MG tablet Take 1 tablet (6.25 mg total) by mouth 2 (two) times daily with a meal. 60 tablet 3   dapagliflozin propanediol (FARXIGA) 10 MG TABS tablet Take 1 tablet (10 mg total) by mouth daily before breakfast. 30 tablet 6   furosemide (LASIX) 40 MG tablet Take 1.5 tablets (60 mg total) by mouth daily. 30 tablet 3   ibuprofen (ADVIL) 400 MG tablet Take 400 mg by mouth every 8 (eight) hours as needed.     isosorbide mononitrate (IMDUR) 60 MG 24 hr tablet Take 1 tablet (60 mg total) by mouth daily. 30 tablet 3   losartan (COZAAR) 100 MG tablet Take 1 tablet (100 mg total) by mouth daily. 30 tablet 3   Misc. Devices (GNP DIGITAL WEIGHT SCALE) MISC 1 each by Does not apply route daily. Use to weight yourself daily. 1 each 0   spironolactone (ALDACTONE) 25 MG tablet Take 0.5 tablets (12.5 mg total) by mouth daily. Appointment Required For Further Refills [phone] 15 tablet 3   No current facility-administered medications for this encounter.   Social History   Tobacco Use   Smoking status: Some Days    Packs/day: 0.25    Years: 20.00    Pack years: 5.00    Types: Cigarettes   Smokeless tobacco: Never   Tobacco comments:    1-2 cigs per day (08/15/14)  Vaping Use   Vaping Use: Never used  Substance Use Topics   Alcohol use: No    Alcohol/week: 0.0 standard drinks    Comment: stopped in april 2014   Drug use: Not Currently    Types: Cocaine   FHx: - Mom died (81s) in MVA when he was 71 months old - Dad murdered (14yo) when he was in 7th grade  - No FHx of HF  BP (!) 154/96    Pulse (!) 59    Wt 85.1 kg (187 lb 9.6 oz)    SpO2 97%    BMI 24.42 kg/m   Wt Readings from Last 3 Encounters:  01/25/21 85.1 kg (187 lb 9.6 oz)  12/02/20 85.3 kg (188 lb)  09/28/20 85.7 kg (189 lb)   PHYSICAL EXAM: General:  NAD. No resp difficulty HEENT: Normal Neck: Supple. No JVD. Carotids 2+ bilat; no bruits. No lymphadenopathy or thryomegaly  appreciated. Cor: PMI nondisplaced. Regular rate & rhythm. No rubs, gallops or murmurs. Lungs: Clear Abdomen: Soft, nontender, nondistended. No hepatosplenomegaly. No bruits or masses. Good bowel sounds. Extremities: No cyanosis, clubbing, rash, edema Neuro: Alert & oriented x 3, cranial nerves grossly intact. Moves all 4 extremities w/o  difficulty. Affect pleasant.  ReDs: 47%  ASSESSMENT & PLAN:  1) Chronic systolic HF/NICM:  - Echo 02/2013 EF ~35-40% - NYHA II symptoms.  - Cath 11/14 normal cors - Echo 9/21 EF 20-25% RV mildly HK in setting of severe HTN - Echo 7/22 EF 40-45%, RV ok. - NYHA II-early III symptoms, volume looks OK on exam, weight down 1 lbs, but ReDs 47%. I suspect at least some of his dyspnea is from COPD - Increase Lasix to 60 mg am/40 mg pm x 3 days, then back to Lasix 60 mg daily. - Continue Farxiga 10 daily. - Continue carvedilol 6.25 mg bid. HR 59 - Continue losartan 100 daily (no Entresto due to angiodema with ACEi). - Continue spiro 12.5 mg daily. Won't increase further with borderline high K. - Increase Imdur to 90 mg daily. - BMET/BNP today, repeat BMET in 2 weeks.  2) HTN - Elevated today. - Diurese as above. - Increase Imdur. Call if this causes HA, would then back down and add low-dose hydralazine (did not add hydral today with dizziness. He did say he would be willing to try tid med).  3) Polysubstance abuse - At [facility] abstinent, congratulated on > 1 year sober!  4) Current smoker - Encouraged cessation.  5) OSA  - Per Dr Elsworth Soho. Not using CPAP.  - Encouraged compliance.   Instructed to notify office if symptoms remain after pulse of diuretics. May need to repeat echo +/- CPX. Follow up with [name] in 2 months   [place], [place] 01/25/21

## 2021-01-25 NOTE — Telephone Encounter (Signed)
error 

## 2021-01-25 NOTE — Progress Notes (Signed)
ReDS Vest / Clip - 01/25/21 1000       ReDS Vest / Clip   Station Marker C    Ruler Value 22.5    ReDS Value Range High volume overload    ReDS Actual Value 47

## 2021-01-26 ENCOUNTER — Telehealth (HOSPITAL_COMMUNITY): Payer: Self-pay

## 2021-01-26 NOTE — Telephone Encounter (Addendum)
Pt aware, agreeable, and verbalized understanding   ----- Message from Rafael Bihari, FNP sent at 01/25/2021  1:25 PM EST ----- Labs stable. BNP (fluid marker) normal.   Please take Lasix 60 mg q am/40 mg q pm x 2 days then back to 60 daily (not 3 days in a row) as discussed at visit today.

## 2021-02-08 ENCOUNTER — Other Ambulatory Visit (HOSPITAL_COMMUNITY): Payer: Medicaid Other

## 2021-02-09 ENCOUNTER — Other Ambulatory Visit (HOSPITAL_COMMUNITY): Payer: Self-pay

## 2021-02-09 MED ORDER — LOSARTAN POTASSIUM 100 MG PO TABS: 100.0000 mg | ORAL_TABLET | Freq: Every day | ORAL | 3 refills | Status: DC

## 2021-02-22 ENCOUNTER — Ambulatory Visit (HOSPITAL_COMMUNITY)
Admission: RE | Admit: 2021-02-22 | Discharge: 2021-02-22 | Disposition: A | Discharge status: Home / Self Care | Payer: Medicaid Other | Source: Ambulatory Visit | Attending: Cardiology | Admitting: Cardiology

## 2021-02-22 ENCOUNTER — Other Ambulatory Visit: Payer: Self-pay

## 2021-02-22 DIAGNOSIS — I5022 Chronic systolic (congestive) heart failure: Secondary | ICD-10-CM | POA: Diagnosis not present

## 2021-02-22 LAB — BASIC METABOLIC PANEL
Anion gap: 10 (ref 5–15)
BUN: 10 mg/dL (ref 6–20)
CO2: 24 mmol/L (ref 22–32)
Calcium: 9.1 mg/dL (ref 8.9–10.3)
Chloride: 101 mmol/L (ref 98–111)
Creatinine, Ser: 1.18 mg/dL (ref 0.61–1.24)
GFR, Estimated: >60 mL/min (ref >60)
Glucose, Bld: 111 mg/dL — ABNORMAL HIGH (ref 70–99)
Potassium: 4 mmol/L (ref 3.5–5.1)
Sodium: 135 mmol/L (ref 135–145)

## 2021-04-01 ENCOUNTER — Encounter (HOSPITAL_COMMUNITY): Payer: Medicaid Other | Admitting: Internal Medicine

## 2021-04-20 ENCOUNTER — Emergency Department (HOSPITAL_COMMUNITY): Payer: Medicaid Other

## 2021-04-20 ENCOUNTER — Other Ambulatory Visit: Payer: Self-pay

## 2021-04-20 ENCOUNTER — Emergency Department (HOSPITAL_COMMUNITY)
Admission: EM | Admit: 2021-04-20 | Discharge: 2021-04-20 | Discharge status: Against Medical Advice | Payer: Medicaid Other | Attending: Emergency Medicine | Admitting: Emergency Medicine

## 2021-04-20 DIAGNOSIS — W109XXA Fall (on) (from) unspecified stairs and steps, initial encounter: Secondary | ICD-10-CM | POA: Insufficient documentation

## 2021-04-20 DIAGNOSIS — Y92019 Unspecified place in single-family (private) house as the place of occurrence of the external cause: Secondary | ICD-10-CM | POA: Insufficient documentation

## 2021-04-20 DIAGNOSIS — Y9301 Activity, walking, marching and hiking: Secondary | ICD-10-CM | POA: Diagnosis not present

## 2021-04-20 DIAGNOSIS — M25561 Pain in right knee: Secondary | ICD-10-CM | POA: Diagnosis not present

## 2021-04-20 DIAGNOSIS — Z5321 Procedure and treatment not carried out due to patient leaving prior to being seen by health care provider: Secondary | ICD-10-CM | POA: Diagnosis not present

## 2021-04-20 DIAGNOSIS — M25562 Pain in left knee: Secondary | ICD-10-CM | POA: Diagnosis present

## 2021-04-20 NOTE — ED Provider Triage Note (Signed)
Emergency Medicine Provider Triage Evaluation Note ? ?Marya Amsler , a 61 y.o. male  was evaluated in triage.  Pt was walking when his right knee came complains of right knee pain.  Reports he was walking when his right knee gave out and he fell onto his left knee.  No numbness or tingling.  Tried to go to sleep and then woke up with severe pain. ? ?Review of Systems  ?Positive: Pain ?Negative: Numbness and tingling ? ?Physical Exam  ?BP (!) 164/108   Pulse 83   Temp 98.1 ?F (36.7 ?C) (Oral)   Resp 14   SpO2 98%  ?Gen:   Awake, no distress   ?Resp:  Normal effort  ?MSK:   Moves extremities without difficulty  ?Other:  Tenderness to palpation superiorly and laterally of the left knee.  No obvious deformity, superficial abrasion.  Negative varus/valgus/posterior and anterior drawer ? ?Medical Decision Making  ?Medically screening exam initiated at 11:17 AM.  Appropriate orders placed.  Marya Amsler was informed that the remainder of the evaluation will be completed by another provider, this initial triage assessment does not replace that evaluation, and the importance of remaining in the ED until their evaluation is complete. ? ? ?Discharge after x-ray ?  ?Rhae Hammock, PA-C ?04/20/21 1118 ? ?

## 2021-04-20 NOTE — ED Triage Notes (Signed)
Pt with L knee pain after falling down a flight of stairs in his house last night. Denies dizziness prior to fall, LOC, or other injury.  ?

## 2021-04-26 ENCOUNTER — Ambulatory Visit (INDEPENDENT_AMBULATORY_CARE_PROVIDER_SITE_OTHER): Payer: Medicaid Other | Admitting: Physician Assistant

## 2021-04-26 ENCOUNTER — Ambulatory Visit: Payer: Medicaid Other | Admitting: Physician Assistant

## 2021-04-26 ENCOUNTER — Encounter: Payer: Self-pay | Admitting: Physician Assistant

## 2021-04-26 DIAGNOSIS — M25562 Pain in left knee: Secondary | ICD-10-CM | POA: Diagnosis not present

## 2021-04-26 NOTE — Progress Notes (Signed)
? ?Office Visit Note ?  ?Patient: Charles Cervantes           ?Date of Birth: [birth year]           ?MRN: [id number] ?Visit Date: 04/26/2021 ?             ?Requested by: [name], MD ?[address] ?[place],  [place] [address] ?PCP: [name], MD ? ?Chief Complaint  ?Patient presents with  ? Left Knee - Pain  ?  DOI 04/19/2021  ? ? ? ? ?HPI: ?Patient is a pleasant 61 year old gentleman who presents today with a 1 week history of left knee pain.  He said he twisted it a week ago.  He denies any popping or clicking.  He denies any catching.  He was seen and evaluated in the emergency room he is currently wearing a brace.  He is not a diabetic.  He has no other joint pain.  He denies any recent injury of being ill ? ?Assessment & Plan: ?Visit Diagnoses:  ? ?Plan: Left knee pain concerning for meniscus tear.  He does have an effusion.  I did sterilely prepped the lateral side of the suprapatellar pouch with Betadine and alcohol.  I anesthetized this with lidocaine.  I did start to aspirate approximately 10 cc of yellow fluid from the knee.  Patient requested that we not continue with it because he was having too much pain.  I talked to the patient that he should continue to ice and I will send in some anti-inflammatories for him.  Like to see him back in 2 weeks.  Certainly if we could get some of the effusion down we could consider an injection of some steroid.  Ultimately if he did not get better could consider an MRI ? ?Follow-Up Instructions: No follow-ups on file.  ? ?Ortho Exam ? ?Patient is alert, oriented, no adenopathy, well-dressed, normal affect, normal respiratory effort. ?Examination of his left knee he does have a moderate effusion but no erythema or redness.  He has some pain over the medial and lateral joint line.  Good endpoint with Lachman testing good varus valgus stability compartments are soft ? ?Imaging: ?No results found. ?No images are attached to the encounter. ? ?Labs: ?Lab Results   ?Component Value Date  ? HGBA1C 6.0 (H) 06/16/2020  ? HGBA1C 5.0 09/17/2019  ? HGBA1C 5.6 04/18/2014  ? ESRSEDRATE 1 09/18/2019  ? ? ? ?Lab Results  ?Component Value Date  ? ALBUMIN 5.0 (H) 06/16/2020  ? ALBUMIN 3.8 09/17/2019  ? ALBUMIN 3.6 02/11/2019  ? ? ?Lab Results  ?Component Value Date  ? MG 1.8 09/18/2019  ? MG 1.9 05/02/2012  ? ?No results found for: VD25OH ? ?No results found for: PREALBUMIN ? ?  Latest Ref Rng & Units 06/16/2020  ?  9:53 AM 09/17/2019  ?  5:10 PM 02/11/2019  ?  8:25 PM  ?CBC EXTENDED  ?WBC 3.4 - 10.8 x10E3/uL 5.6   3.8   3.6    ?RBC 4.14 - 5.80 x10E6/uL 5.83   5.09   4.08    ?Hemoglobin 13.0 - 17.7 g/dL 16.8   16.1   13.5    ?HCT 37.5 - 51.0 % 50.2   49.9   41.4    ?Platelets 150 - 450 x10E3/uL 221   208   202    ?NEUT# 1.7 - 7.7 K/uL  1.8   1.7    ?Lymph# 0.7 - 4.0 K/uL  1.3   1.3    ? ? ? ?  There is no height or weight on file to calculate BMI. ? ?Orders:  ?No orders of the defined types were placed in this encounter. ? ?No orders of the defined types were placed in this encounter. ? ? ? Procedures: ?No procedures performed ? ?Clinical Data: ?No additional findings. ? ?ROS: ? ?All other systems negative, except as noted in the HPI. ?Review of Systems ? ?Objective: ?Vital Signs: There were no vitals taken for this visit. ? ?Specialty Comments:  ?No specialty comments available. ? ?PMFS History: ?Patient Active Problem List  ? Diagnosis Date Noted  ? Cubital tunnel syndrome on right 08/25/2020  ? Sleep concern 08/25/2020  ? Foot pain, left 06/16/2020  ? Impingement syndrome of left shoulder 06/16/2020  ? Encounter for hepatitis C virus screening test for high risk patient 06/16/2020  ? NICM (nonischemic cardiomyopathy) ([place]) 12/04/2015  ? Lumbar back pain 09/03/2014  ? Spinal stenosis in cervical region 04/21/2014  ? PTSD (post-traumatic stress disorder) 11/04/2013  ? HTN (hypertension) 01/01/2013  ? Current smoker 09/05/2012  ? Chronic systolic heart failure ([place]) 05/08/2012  ? Alcohol abuse  05/01/2012  ? Cocaine abuse ([place]) 05/01/2012  ? ?Past Medical History:  ?Diagnosis Date  ? Anxiety   ? CHF (congestive heart failure) ([place])   ? takes Furosemide daily  ? Chronic back pain   ? Constipation   ? takes Colace daily  ? COPD (chronic obstructive pulmonary disease) ([place])   ? Spiriva daily  ? Depression   ? takes Effexor daily and Risperdal nightly  ? Dizziness   ? H/O blood clots 01/03/2010  ? in leg  ? Headache   ? occasionally  ? Hyperlipidemia   ? Hypertension   ? takes Losartan and Coreg daily  ? Muscle spasm   ? takes Zizanidine daily as needed  ? Shortness of breath dyspnea   ? more with exertion but sometimes notices with lying/sitting  ? Sleep apnea   ? Substance abuse ([place])   ? Weakness   ? tingling and numbness  ?  ?Family History  ?Problem Relation Age of Onset  ? Prostate cancer Paternal Uncle   ? Colon cancer Neg Hx   ? Colon polyps Neg Hx   ? Esophageal cancer Neg Hx   ? Rectal cancer Neg Hx   ? Stomach cancer Neg Hx   ?  ?Past Surgical History:  ?Procedure Laterality Date  ? LEFT AND RIGHT HEART CATHETERIZATION WITH CORONARY ANGIOGRAM N/A 11/20/2012  ? Procedure: LEFT AND RIGHT HEART CATHETERIZATION WITH CORONARY ANGIOGRAM;  Surgeon: Jolaine Artist, MD;  [facility] C[facility] LAB;  Service: Cardiovascular;  Laterality: N/A;  ? Left Second finger surgery    ? LUMBAR FUSION  09/05/2014  ? L5 S1  ? ?Social History  ? ?Occupational History  ? Occupation: unemployed  ?Tobacco Use  ? Smoking status: Some Days  ?  Packs/day: 0.25  ?  Years: 20.00  ?  Pack years: 5.00  ?  Types: Cigarettes  ? Smokeless tobacco: Never  ? Tobacco comments:  ?  1-2 cigs per day (08/15/14)  ?Vaping Use  ? Vaping Use: Never used  ?Substance and Sexual Activity  ? Alcohol use: No  ?  Alcohol/week: 0.0 standard drinks  ?  Comment: stopped in april 2014  ? Drug use: Not Currently  ?  Types: Cocaine  ? Sexual activity: Never  ? ? ? ? ? ?

## 2021-04-27 ENCOUNTER — Other Ambulatory Visit: Payer: Self-pay | Admitting: Physician Assistant

## 2021-04-27 ENCOUNTER — Telehealth: Payer: Self-pay | Admitting: Physician Assistant

## 2021-04-27 MED ORDER — MELOXICAM 15 MG PO TABS: 15.0000 mg | ORAL_TABLET | Freq: Every day | ORAL | 2 refills | Status: DC

## 2021-04-27 NOTE — Telephone Encounter (Signed)
Pt called and states that he was suppose to have his medication called in and it was never received at pharmacy.  ? ?CB [phone] ?

## 2021-05-10 ENCOUNTER — Ambulatory Visit: Payer: Medicaid Other | Admitting: Physician Assistant

## 2021-06-20 NOTE — Progress Notes (Deleted)
    SUBJECTIVE:   Chief compliant/HPI: annual examination  ALEXY HELDT is a 61 y.o. who presents today for an annual exam.   History tabs reviewed and updated ***.   Review of systems form reviewed and notable for ***.   Current concerns: ***  OBJECTIVE:   There were no vitals taken for this visit.  ***  ASSESSMENT/PLAN:   No problem-specific Assessment & Plan notes found for this encounter.    Annual Examination  See AVS for age appropriate recommendations.  PHQ score ***, reviewed and discussed.  Blood pressure value is *** goal, discussed.   Considered the following screening exams based upon USPSTF recommendations: Diabetes screening: {discussed/ordered:14545} Screening for elevated cholesterol: {discussed/ordered:14545} HIV testing: {discussed/ordered:14545} Hepatitis C: {discussed/ordered:14545} Hepatitis B: {discussed/ordered:14545} Syphilis if at high risk: {discussed/ordered:14545} Reviewed risk factors for latent tuberculosis and {not indicated/requested/declined:14582} Colorectal cancer screening: {crcscreen:23821::"discussed, colonoscopy ordered"} Lung cancer screening: {discussed/declined/written QQIW:97989} See documentation below regarding discussion and indication.  PSA discussed and after engaging in discussion of possible risks, benefits and complications of screening patient elected to ***.  Immunizations: given printed Rx to received Shingrix at his pharmacy  Follow up in 1 year or sooner if indicated.    [name], MD [place]

## 2021-06-21 ENCOUNTER — Encounter: Payer: Medicaid Other | Admitting: Family Medicine

## 2021-06-21 DIAGNOSIS — Z0000 Encounter for general adult medical examination without abnormal findings: Secondary | ICD-10-CM

## 2021-06-28 ENCOUNTER — Encounter (HOSPITAL_COMMUNITY): Payer: Medicaid Other | Admitting: Internal Medicine

## 2021-07-08 ENCOUNTER — Ambulatory Visit (INDEPENDENT_AMBULATORY_CARE_PROVIDER_SITE_OTHER): Payer: Medicaid Other | Admitting: Family Medicine

## 2021-07-08 VITALS — BP 158/90 | Ht 73.5 in | Wt 189.8 lb

## 2021-07-08 DIAGNOSIS — F17200 Nicotine dependence, unspecified, uncomplicated: Secondary | ICD-10-CM

## 2021-07-08 DIAGNOSIS — M25562 Pain in left knee: Secondary | ICD-10-CM

## 2021-07-08 MED ORDER — NICOTINE 14 MG/24HR TD PT24: 14.0000 mg | MEDICATED_PATCH | Freq: Every day | TRANSDERMAL | 0 refills | Status: DC

## 2021-07-08 MED ORDER — NICOTINE POLACRILEX 2 MG MT GUM: 2.0000 mg | CHEWING_GUM | OROMUCOSAL | 0 refills | Status: DC | PRN

## 2021-07-08 MED ORDER — NAPROXEN 500 MG PO TABS: 500.0000 mg | ORAL_TABLET | Freq: Two times a day (BID) | ORAL | 0 refills | Status: DC

## 2021-07-08 NOTE — Progress Notes (Signed)
    SUBJECTIVE:   CHIEF COMPLAINT / HPI:   L Knee Pain Present for approximately 2 months Started after twisting injury Worse over past 2 weeks after fall on stairs Pain located in medial anterior knee No significant pain at rest Very painful with ambulation Using knee brace which he finds helpful Previously saw Ortho who aspirated 10 cc's and then stopped secondary to patient's pain No fever, redness + swelling Not using anything for relief  Tobacco Use Currently smoking 7 to 10 cigarettes daily Smokes [place] brand Motivated to cut back/quit Not interested in oral medication at this time Prefers to use nicotine patch/gum Has not attempted to quit in the past  PERTINENT  PMH / PSH: HFrEF, prior polysubstance abuse, HTN  OBJECTIVE:   BP (!) 158/90   Ht 6' 1.5" (1.867 m)   Wt 189 lb 12.8 oz (86.1 kg)   SpO2 97%   BMI 24.70 kg/m   General: NAD, pleasant, able to participate in exam Respiratory: No respiratory distress Skin: warm and dry, no rashes noted Psych: Normal affect and mood Neuro: grossly intact Left Knee: Small effusion noted. No erythema, warmth, or obvious bony abnormalities noted. TTP at medial joint line. No obvious Baker's cyst development. ROM normal in extension (0 degrees), slightly limited in flexion secondary to pain. Strength 5/5 with knee flexion and extension. Neurovascularly intact bilaterally. Provocative Testing: - Cruciate Ligaments:   - Anterior Drawer: ? mild laxity as compared to right - Posterior Drawer: NEG  - Collateral Ligaments:   - Varus/Valgus (MCL/LCL) Stress test at 0, 15d: NEG  - Meniscus:   - Thessaly: unable to perform secondary to pain   ASSESSMENT/PLAN:   Pain in left knee Subacute, not improving. Prior x-rays were unremarkable. Small effusion noted on exam today. Clinical presentation suspicious for possible meniscal or ligamentous injury. Ortho had recommended MRI if not improving. -f/u with ortho for MRI -Naproxen  '500mg'$  BID x7 days in the meantime  Tobacco use disorder Currently smoking 7-10 cigarettes daily.  Motivated to cut back/quit. -Rx sent for nicotine patch 14 mg daily and nicotine gum 2 mg prn -Refer to Dr. Valentina Lucks for ongoing cessation management   Follow-up in 2 to 4 weeks for annual physical, BP visit   [name], MD [place]

## 2021-07-09 ENCOUNTER — Encounter: Payer: Self-pay | Admitting: Family Medicine

## 2021-07-09 ENCOUNTER — Other Ambulatory Visit (HOSPITAL_COMMUNITY): Payer: Self-pay | Admitting: Family Medicine

## 2021-07-09 DIAGNOSIS — I5022 Chronic systolic (congestive) heart failure: Secondary | ICD-10-CM

## 2021-07-09 NOTE — Assessment & Plan Note (Signed)
Currently smoking 7-10 cigarettes daily.  Motivated to cut back/quit. -Rx sent for nicotine patch 14 mg daily and nicotine gum 2 mg prn -Refer to Dr. Valentina Lucks for ongoing cessation management

## 2021-07-09 NOTE — Assessment & Plan Note (Addendum)
Subacute, not improving. Prior x-rays were unremarkable. Small effusion noted on exam today. Clinical presentation suspicious for possible meniscal or ligamentous injury. Ortho had recommended MRI if not improving. -f/u with ortho for MRI -Naproxen '500mg'$  BID x7 days in the meantime

## 2021-07-12 ENCOUNTER — Other Ambulatory Visit: Payer: Self-pay | Admitting: Family Medicine

## 2021-07-12 ENCOUNTER — Ambulatory Visit (INDEPENDENT_AMBULATORY_CARE_PROVIDER_SITE_OTHER): Payer: Medicaid Other | Admitting: Physician Assistant

## 2021-07-12 DIAGNOSIS — M25562 Pain in left knee: Secondary | ICD-10-CM

## 2021-07-12 DIAGNOSIS — S83282D Other tear of lateral meniscus, current injury, left knee, subsequent encounter: Secondary | ICD-10-CM | POA: Diagnosis not present

## 2021-07-12 NOTE — Progress Notes (Signed)
Office Visit Note   Patient: Charles Cervantes           Date of Birth: 1960/11/02           MRN: [id number] Visit Date: 07/12/2021              Requested by: [name], MD [address] [place],  [place] [address] PCP: [name], MD  Chief Complaint  Patient presents with   Left Knee - Pain      HPI: Patient is a pleasant 61 year old gentleman who follows up today for his left knee pain.  I first evaluated him 2 months ago after a twisting injury to his left knee he had no previous injury prior to that examination at that time was concerning for meniscus tear.  I did aspirate 10 cc of yellow fluid from the knee.  Since then he has continued to have pain over the medial and lateral joint line.  I did not inject him with steroid at last visit because he requested not to.  He has had no improvement in his symptoms.  Assessment & Plan: Visit Diagnoses:  1. Acute lateral meniscus tear of left knee, subsequent encounter     Plan: Given the length of time I would recommend an MRI scan with follow-up afterwards rule out meniscus tear if it just shows degenerative changes I would recommend again that he try a steroid injection if he is willing to  Follow-Up Instructions: No follow-ups on file.   Ortho Exam  Patient is alert, oriented, no adenopathy, well-dressed, normal affect, normal respiratory effort. Left knee no effusion no redness no cellulitis he has good varus valgus stability.  He has tenderness over the medial and lateral posterior joint lines.  Has pain with terminal flexion extension  Imaging: No results found. No images are attached to the encounter.  Labs: Lab Results  Component Value Date   HGBA1C 6.0 (H) 06/16/2020   HGBA1C 5.0 09/17/2019   HGBA1C 5.6 04/18/2014   ESRSEDRATE 1 09/18/2019     Lab Results  Component Value Date   ALBUMIN 5.0 (H) 06/16/2020   ALBUMIN 3.8 09/17/2019   ALBUMIN 3.6 02/11/2019    Lab Results  Component Value Date   MG  1.8 09/18/2019   MG 1.9 05/02/2012   No results found for: "VD25OH"  No results found for: "PREALBUMIN"    Latest Ref Rng & Units 06/16/2020    9:53 AM 09/17/2019    5:10 PM 02/11/2019    8:25 PM  CBC EXTENDED  WBC 3.4 - 10.8 x10E3/uL 5.6  3.8  3.6   RBC 4.14 - 5.80 x10E6/uL 5.83  5.09  4.08   Hemoglobin 13.0 - 17.7 g/dL 16.8  16.1  13.5   HCT 37.5 - 51.0 % 50.2  49.9  41.4   Platelets 150 - 450 x10E3/uL 221  208  202   NEUT# 1.7 - 7.7 K/uL  1.8  1.7   Lymph# 0.7 - 4.0 K/uL  1.3  1.3      There is no height or weight on file to calculate BMI.  Orders:  Orders Placed This Encounter  Procedures   MR Knee Left w/o contrast   No orders of the defined types were placed in this encounter.    Procedures: No procedures performed  Clinical Data: No additional findings.  ROS:  All other systems negative, except as noted in the HPI. Review of Systems  Objective: Vital Signs: There were no vitals taken  for this visit.  Specialty Comments:  No specialty comments available.  PMFS History: Patient Active Problem List   Diagnosis Date Noted   Pain in left knee 04/26/2021   Cubital tunnel syndrome on right 08/25/2020   Sleep concern 08/25/2020   Foot pain, left 06/16/2020   Impingement syndrome of left shoulder 06/16/2020   Encounter for hepatitis C virus screening test for high risk patient 06/16/2020   NICM (nonischemic cardiomyopathy) ([place]) 12/04/2015   Lumbar back pain 09/03/2014   Spinal stenosis in cervical region 04/21/2014   PTSD (post-traumatic stress disorder) 11/04/2013   HTN (hypertension) 01/01/2013   Tobacco use disorder 03/70/4888   Chronic systolic heart failure ([place]) 05/08/2012   Alcohol abuse 05/01/2012   Cocaine abuse ([place]) 05/01/2012   Past Medical History:  Diagnosis Date   Anxiety    CHF (congestive heart failure) (HCC)    takes Furosemide daily   Chronic back pain    Constipation    takes Colace daily   COPD (chronic obstructive pulmonary  disease) ([place])    Spiriva daily   Depression    takes Effexor daily and Risperdal nightly   Dizziness    H/O blood clots 01/03/2010   in leg   Headache    occasionally   Hyperlipidemia    Hypertension    takes Losartan and Coreg daily   Muscle spasm    takes Zizanidine daily as needed   Shortness of breath dyspnea    more with exertion but sometimes notices with lying/sitting   Sleep apnea    Substance abuse (HCC)    Weakness    tingling and numbness    Family History  Problem Relation Age of Onset   Prostate cancer Paternal Uncle    Colon cancer Neg Hx    Colon polyps Neg Hx    Esophageal cancer Neg Hx    Rectal cancer Neg Hx    Stomach cancer Neg Hx     Past Surgical History:  Procedure Laterality Date   LEFT AND RIGHT HEART CATHETERIZATION WITH CORONARY ANGIOGRAM N/A 11/20/2012   Procedure: LEFT AND RIGHT HEART CATHETERIZATION WITH CORONARY ANGIOGRAM;  Surgeon: Jolaine Artist, MD;  Location: [facility] CATH LAB;  Service: Cardiovascular;  Laterality: N/A;   Left Second finger surgery     LUMBAR FUSION  09/05/2014   L5 S1   Social History   Occupational History   Occupation: unemployed  Tobacco Use   Smoking status: Some Days    Packs/day: 0.25    Years: 20.00    Total pack years: 5.00    Types: Cigarettes   Smokeless tobacco: Never   Tobacco comments:    1-2 cigs per day (08/15/14)  Vaping Use   Vaping Use: Never used  Substance and Sexual Activity   Alcohol use: No    Alcohol/week: 0.0 standard drinks of alcohol    Comment: stopped in april 2014   Drug use: Not Currently    Types: Cocaine   Sexual activity: Never

## 2021-08-02 ENCOUNTER — Inpatient Hospital Stay: Admission: RE | Admit: 2021-08-02 | Payer: Medicaid Other | Source: Ambulatory Visit

## 2021-08-05 ENCOUNTER — Other Ambulatory Visit (HOSPITAL_COMMUNITY): Payer: Self-pay | Admitting: Family Medicine

## 2021-08-09 ENCOUNTER — Encounter: Payer: Medicaid Other | Admitting: Family Medicine

## 2021-08-16 ENCOUNTER — Ambulatory Visit
Admission: RE | Admit: 2021-08-16 | Discharge: 2021-08-16 | Disposition: A | Discharge status: Home / Self Care | Payer: Medicaid Other | Source: Ambulatory Visit | Attending: Physician Assistant | Admitting: Physician Assistant

## 2021-08-16 DIAGNOSIS — S83282D Other tear of lateral meniscus, current injury, left knee, subsequent encounter: Secondary | ICD-10-CM

## 2021-08-17 ENCOUNTER — Telehealth: Payer: Self-pay

## 2021-08-17 NOTE — Telephone Encounter (Signed)
Attempted to contact patient and get him scheduled to see Dr. Marlou Sa per Cheral Almas request however was not able to leave a voicemail.

## 2021-08-17 NOTE — Telephone Encounter (Signed)
-----   Message from Bally, [place] sent at 08/17/2021  3:34 PM EDT ----- Can he get a follow up to review MRI with Dr. Marlou Sa? ----- Message ----- From: Interface, Rad Results In Sent: 08/17/2021   1:56 PM EDT To: Bevely Palmer Persons, PA

## 2021-08-18 ENCOUNTER — Telehealth: Payer: Self-pay | Admitting: Pharmacist

## 2021-08-18 NOTE — Telephone Encounter (Signed)
-----   Message from [name], MD sent at 07/09/2021  7:51 AM EDT ----- This patient is interested in smoking cessation- hoping you could help. Thanks!

## 2021-08-18 NOTE — Telephone Encounter (Signed)
Attempted to contact patient for follow-up of tobacco intake reduction / cessation.   No answer - unable to leave message - 3 attempts.   No additional follow-up planned.  Please refer back to me if I can be of help in the future with tobacco cessation.

## 2021-08-23 ENCOUNTER — Telehealth: Payer: Self-pay | Admitting: Physician Assistant

## 2021-08-23 ENCOUNTER — Ambulatory Visit: Payer: Medicaid Other | Admitting: Physician Assistant

## 2021-08-23 NOTE — Telephone Encounter (Signed)
Pt called and states he just got in town and would like someone to call him with his mri results.

## 2021-09-13 ENCOUNTER — Encounter: Payer: Self-pay | Admitting: Orthopedic Surgery

## 2021-09-13 ENCOUNTER — Ambulatory Visit (INDEPENDENT_AMBULATORY_CARE_PROVIDER_SITE_OTHER): Payer: Medicaid Other | Admitting: Orthopedic Surgery

## 2021-09-13 DIAGNOSIS — S83242D Other tear of medial meniscus, current injury, left knee, subsequent encounter: Secondary | ICD-10-CM | POA: Diagnosis not present

## 2021-09-13 MED ORDER — BUPIVACAINE HCL 0.25 % IJ SOLN
4.0000 mL | INTRAMUSCULAR | Status: AC | PRN
  Administered 2021-09-13: 4 mL via INTRA_ARTICULAR

## 2021-09-13 MED ORDER — METHYLPREDNISOLONE ACETATE 40 MG/ML IJ SUSP
40.0000 mg | INTRAMUSCULAR | Status: AC | PRN
  Administered 2021-09-13: 40 mg via INTRA_ARTICULAR

## 2021-09-13 MED ORDER — LIDOCAINE HCL 1 % IJ SOLN
5.0000 mL | INTRAMUSCULAR | Status: AC | PRN
  Administered 2021-09-13: 5 mL

## 2021-09-13 NOTE — Progress Notes (Signed)
Office Visit Note   Patient: Charles Cervantes           Date of Birth: 1960/05/11           MRN: [id number] Visit Date: 09/13/2021 Requested by: [name], MD [place],  [place] [address] PCP: [name], MD  Subjective: Chief Complaint  Patient presents with   Other    Scan review    HPI:  Charles Cervantes is a 61 year old patient with left knee pain.  Twisted his knee going down the stairs about 8 weeks ago.  MRI scan has been performed.  That shows ill-defined ACL femoral attachment with some waviness of the ACL consistent with subacute partial tear.  There is also mild to moderate chondral thinning on the medial side along with a medial meniscal tear. Patient is not having any symptomatic instability but does report pain going up and down stairs.  He likes to play basketball.  His bedroom is upstairs.                 ROS: All systems reviewed are negative as they relate to the chief complaint within the history of present illness.  Patient denies  fevers or chills.   Assessment & Plan: Visit Diagnoses:  1. Acute medial meniscal tear, left, subsequent encounter     Plan: Impression is left knee likely acute on chronic degenerative meniscal tear.  Does have an effusion.  The effusion aspirated today is bloody consistent with acute injury.  He does have an endpoint on Lachman and anterior drawer testing on the left which is about 2 to 3 mm compared to the right which is 1 or less millimeters anterior drawer.  Therefore does make sense that he has had a least a partial ACL tear even though he has an endpoint.  Medial meniscal pathology also present.  Aspiration injection performed today.  4-week return with decision for or against surgery at that time.  The next 7 days I would like for him to take it easy but after 7 days beginning next week I would like for him to try to return to the types of activities he likes to do so we can see whether or not this knee is going to  be suitable for him to continue his desired activities.  If not we may need to consider surgical intervention.  Follow-Up Instructions: No follow-ups on file.   Orders:  No orders of the defined types were placed in this encounter.  No orders of the defined types were placed in this encounter.     Procedures: Large Joint Inj: L knee on 09/13/2021 1:55 PM Indications: diagnostic evaluation, joint swelling and pain Details: 18 G 1.5 in needle, superolateral approach  Arthrogram: No  Medications: 5 mL lidocaine 1 %; 40 mg methylPREDNISolone acetate 40 MG/ML; 4 mL bupivacaine 0.25 % Aspirate: 30 mL blood-tinged Outcome: tolerated well, no immediate complications Procedure, treatment alternatives, risks and benefits explained, specific risks discussed. Consent was given by the patient. Immediately prior to procedure a time out was called to verify the correct patient, procedure, equipment, support staff and site/side marked as required. Patient was prepped and draped in the usual sterile fashion.       Clinical Data: No additional findings.  Objective: Vital Signs: There were no vitals taken for this visit.  Physical Exam:   Constitutional: Patient appears well-developed HEENT:  Head: Normocephalic Eyes:EOM are normal Neck: Normal range of motion Cardiovascular: Normal rate Pulmonary/chest: Effort normal  Neurologic: Patient is alert Skin: Skin is warm Psychiatric: Patient has normal mood and affect   Ortho Exam: Ortho exam demonstrates mild effusion left knee no effusion right knee.  Full range of motion from 0-1 25 of flexion.  Collaterals are stable to varus valgus stress at 0 30 and 90 degrees.  Does have some ACL laxity but good endpoint on the left compared to the right 3 mm Lachman on the left 1 mm Lachman on the right.  No posterior lateral rotatory instability is noted.  Anterior drawer is about 3 mm on the left compared to 1 mm on the right.  Does have medial greater  than lateral joint line tenderness  Specialty Comments:  No specialty comments available.  Imaging: No results found.   PMFS History: Patient Active Problem List   Diagnosis Date Noted   Pain in left knee 04/26/2021   Cubital tunnel syndrome on right 08/25/2020   Sleep concern 08/25/2020   Foot pain, left 06/16/2020   Impingement syndrome of left shoulder 06/16/2020   Encounter for hepatitis C virus screening test for high risk patient 06/16/2020   NICM (nonischemic cardiomyopathy) ([place]) 12/04/2015   Lumbar back pain 09/03/2014   Spinal stenosis in cervical region 04/21/2014   PTSD (post-traumatic stress disorder) 11/04/2013   HTN (hypertension) 01/01/2013   Tobacco use disorder 37/62/8315   Chronic systolic heart failure ([place]) 05/08/2012   Alcohol abuse 05/01/2012   Cocaine abuse ([place]) 05/01/2012   Past Medical History:  Diagnosis Date   Anxiety    CHF (congestive heart failure) (HCC)    takes Furosemide daily   Chronic back pain    Constipation    takes Colace daily   COPD (chronic obstructive pulmonary disease) ([place])    Spiriva daily   Depression    takes Effexor daily and Risperdal nightly   Dizziness    H/O blood clots 01/03/2010   in leg   Headache    occasionally   Hyperlipidemia    Hypertension    takes Losartan and Coreg daily   Muscle spasm    takes Zizanidine daily as needed   Shortness of breath dyspnea    more with exertion but sometimes notices with lying/sitting   Sleep apnea    Substance abuse (HCC)    Weakness    tingling and numbness    Family History  Problem Relation Age of Onset   Prostate cancer Paternal Uncle    Colon cancer Neg Hx    Colon polyps Neg Hx    Esophageal cancer Neg Hx    Rectal cancer Neg Hx    Stomach cancer Neg Hx     Past Surgical History:  Procedure Laterality Date   LEFT AND RIGHT HEART CATHETERIZATION WITH CORONARY ANGIOGRAM N/A 11/20/2012   Procedure: LEFT AND RIGHT HEART CATHETERIZATION WITH CORONARY  ANGIOGRAM;  Surgeon: Jolaine Artist, MD;  Location: [facility] CATH LAB;  Service: Cardiovascular;  Laterality: N/A;   Left Second finger surgery     LUMBAR FUSION  09/05/2014   L5 S1   Social History   Occupational History   Occupation: unemployed  Tobacco Use   Smoking status: Some Days    Packs/day: 0.25    Years: 20.00    Total pack years: 5.00    Types: Cigarettes   Smokeless tobacco: Never   Tobacco comments:    1-2 cigs per day (08/15/14)  Vaping Use   Vaping Use: Never used  Substance and Sexual Activity   Alcohol use:  No    Alcohol/week: 0.0 standard drinks of alcohol    Comment: stopped in april 2014   Drug use: Not Currently    Types: Cocaine   Sexual activity: Never

## 2021-09-29 ENCOUNTER — Other Ambulatory Visit (HOSPITAL_COMMUNITY): Payer: Self-pay | Admitting: Family Medicine

## 2021-10-11 ENCOUNTER — Ambulatory Visit (INDEPENDENT_AMBULATORY_CARE_PROVIDER_SITE_OTHER): Payer: Medicaid Other | Admitting: Orthopedic Surgery

## 2021-10-11 DIAGNOSIS — S83242D Other tear of medial meniscus, current injury, left knee, subsequent encounter: Secondary | ICD-10-CM

## 2021-10-12 ENCOUNTER — Telehealth: Payer: Self-pay | Admitting: Orthopedic Surgery

## 2021-10-12 NOTE — Telephone Encounter (Signed)
Patient is scheduled for left knee ACL reconstruction 01-13-22 with Dr. Marlou Sa at [facility].  Patient is asking for home health aide to assist with his post op care.  He mentions his [employer] will cover this service if his doctor requests it. Patient can be reached at [phone] to discuss this arrangement.

## 2021-10-13 NOTE — Telephone Encounter (Signed)
IC s/w patient and advised. He verbalized understanding. He will have his insurance representative reach out to [place].

## 2021-10-14 ENCOUNTER — Encounter: Payer: Self-pay | Admitting: Orthopedic Surgery

## 2021-10-14 NOTE — Progress Notes (Addendum)
Office Visit Note   Patient: Charles Cervantes           Date of Birth: [birth year]           MRN: [id number] Visit Date: 10/11/2021 Requested by: [name], MD [place],  [place] [address] PCP: [name], MD  Subjective: Chief Complaint  Patient presents with   Left Knee - Follow-up    HPI: Charles Cervantes is a 61 y.o. male who presents to the office reporting left knee pain.  Patient had aspiration and injection on 1123.  MRI scan does show ACL and medial meniscal tear.  He states that his knee feels unstable.  He is retired.  He describes some symptoms of instability as well as increasing difficulty getting around due to both pain and weakness in the left leg.  He has been taking Aleve.  He has 1 flight of stairs at home.  No personal or family history of DVT or pulmonary embolism..                ROS: All systems reviewed are negative as they relate to the chief complaint within the history of present illness.  Patient denies fevers or chills.  Assessment & Plan: Visit Diagnoses:  1. Acute medial meniscal tear, left, subsequent encounter     Plan: Impression is left knee instability with more laxity on exam today compared to prior visit.  Knee aspiration is performed today.  I would like him to continue to work on quad and hamstring strengthening exercises but based on his examination today and with the increased laxity with anterior drawer and Lachman compared to last visit I think his partial tear is closer to a full tear than a tear that is going to be able to heal.  Additionally increased instability is present from loss of secondary stabilizer the posterior horn of the medial meniscus which is torn.  The risk and benefits of ACL reconstruction are discussed.  We will see him back in early December so we can post him for sometime after the holidays.  Aspiration of the knee performed today.  Follow-Up Instructions: No follow-ups on file.   Orders:  No orders of  the defined types were placed in this encounter.  No orders of the defined types were placed in this encounter.     Procedures: Large Joint Inj: L knee on 10/11/2021 8:35 PM Indications: diagnostic evaluation, joint swelling and pain Details: 18 G 1.5 in needle, superolateral approach  Arthrogram: No  Medications: 5 mL lidocaine 1 %; 4 mL bupivacaine 0.25 % Outcome: tolerated well, no immediate complications Procedure, treatment alternatives, risks and benefits explained, specific risks discussed. Consent was given by the patient. Immediately prior to procedure a time out was called to verify the correct patient, procedure, equipment, support staff and site/side marked as required. Patient was prepped and draped in the usual sterile fashion.       Clinical Data: No additional findings.  Objective: Vital Signs: There were no vitals taken for this visit.  Physical Exam:  Constitutional: Patient appears well-developed HEENT:  Head: Normocephalic Eyes:EOM are normal Neck: Normal range of motion Cardiovascular: Normal rate Pulmonary/chest: Effort normal Neurologic: Patient is alert Skin: Skin is warm Psychiatric: Patient has normal mood and affect  Ortho Exam: Ortho exam demonstrates slightly antalgic gait to the left but he is got full extension and flexion to about 100 degrees.  Collaterals are stable to varus and valgus stress at 0 and  30 degrees.  No posterolateral rotatory instability is noted.  Medial joint line tenderness is present.  No masses lymphadenopathy or skin changes noted in that left knee region.  Anterior Lachman and anterior drawer positive on the left about 4 mm with soft endpoint which is increased compared to prior exam.  Negative patellar apprehension.  Specialty Comments:  No specialty comments available.  Imaging: No results found.   PMFS History: Patient Active Problem List   Diagnosis Date Noted   Pain in left knee 04/26/2021   Cubital tunnel  syndrome on right 08/25/2020   Sleep concern 08/25/2020   Foot pain, left 06/16/2020   Impingement syndrome of left shoulder 06/16/2020   Encounter for hepatitis C virus screening test for high risk patient 06/16/2020   NICM (nonischemic cardiomyopathy) ([place]) 12/04/2015   Lumbar back pain 09/03/2014   Spinal stenosis in cervical region 04/21/2014   PTSD (post-traumatic stress disorder) 11/04/2013   HTN (hypertension) 01/01/2013   Tobacco use disorder 30/07/6224   Chronic systolic heart failure ([place]) 05/08/2012   Alcohol abuse 05/01/2012   Cocaine abuse ([place]) 05/01/2012   Past Medical History:  Diagnosis Date   Anxiety    CHF (congestive heart failure) (HCC)    takes Furosemide daily   Chronic back pain    Constipation    takes Colace daily   COPD (chronic obstructive pulmonary disease) ([place])    Spiriva daily   Depression    takes Effexor daily and Risperdal nightly   Dizziness    H/O blood clots 01/03/2010   in leg   Headache    occasionally   Hyperlipidemia    Hypertension    takes Losartan and Coreg daily   Muscle spasm    takes Zizanidine daily as needed   Shortness of breath dyspnea    more with exertion but sometimes notices with lying/sitting   Sleep apnea    Substance abuse (HCC)    Weakness    tingling and numbness    Family History  Problem Relation Age of Onset   Prostate cancer Paternal Uncle    Colon cancer Neg Hx    Colon polyps Neg Hx    Esophageal cancer Neg Hx    Rectal cancer Neg Hx    Stomach cancer Neg Hx     Past Surgical History:  Procedure Laterality Date   LEFT AND RIGHT HEART CATHETERIZATION WITH CORONARY ANGIOGRAM N/A 11/20/2012   Procedure: LEFT AND RIGHT HEART CATHETERIZATION WITH CORONARY ANGIOGRAM;  Surgeon: Jolaine Artist, MD;  Location: [facility] CATH LAB;  Service: Cardiovascular;  Laterality: N/A;   Left Second finger surgery     LUMBAR FUSION  09/05/2014   L5 S1   Social History   Occupational History   Occupation:  unemployed  Tobacco Use   Smoking status: Some Days    Packs/day: 0.25    Years: 20.00    Total pack years: 5.00    Types: Cigarettes   Smokeless tobacco: Never   Tobacco comments:    1-2 cigs per day (08/15/14)  Vaping Use   Vaping Use: Never used  Substance and Sexual Activity   Alcohol use: No    Alcohol/week: 0.0 standard drinks of alcohol    Comment: stopped in april 2014   Drug use: Not Currently    Types: Cocaine   Sexual activity: Never

## 2021-10-15 ENCOUNTER — Telehealth: Payer: Self-pay

## 2021-10-15 NOTE — Telephone Encounter (Signed)
Patient calls nurse line requesting home health services for after knee surgery on 01/13/22. He reports he spoke with surgeon yesterday and wanted to go ahead and start this process.    Spoke with Jazmin regarding request. Recommended that provider place CCM referral to assist with request.   Will forward to PCP.

## 2021-10-17 DIAGNOSIS — S83242D Other tear of medial meniscus, current injury, left knee, subsequent encounter: Secondary | ICD-10-CM | POA: Diagnosis not present

## 2021-10-17 MED ORDER — LIDOCAINE HCL 1 % IJ SOLN
5.0000 mL | INTRAMUSCULAR | Status: AC | PRN
  Administered 2021-10-11: 5 mL

## 2021-10-17 MED ORDER — BUPIVACAINE HCL 0.25 % IJ SOLN
4.0000 mL | INTRAMUSCULAR | Status: AC | PRN
  Administered 2021-10-11: 4 mL via INTRA_ARTICULAR

## 2021-10-18 ENCOUNTER — Other Ambulatory Visit: Payer: Self-pay | Admitting: Family Medicine

## 2021-10-18 DIAGNOSIS — M25562 Pain in left knee: Secondary | ICD-10-CM

## 2021-11-29 ENCOUNTER — Other Ambulatory Visit (HOSPITAL_COMMUNITY): Payer: Self-pay | Admitting: Family Medicine

## 2021-11-29 DIAGNOSIS — I5022 Chronic systolic (congestive) heart failure: Secondary | ICD-10-CM

## 2021-12-06 ENCOUNTER — Ambulatory Visit: Payer: Medicaid Other | Admitting: Orthopedic Surgery

## 2021-12-16 ENCOUNTER — Other Ambulatory Visit: Payer: Self-pay

## 2021-12-20 ENCOUNTER — Encounter (HOSPITAL_COMMUNITY): Payer: Medicaid Other

## 2021-12-20 ENCOUNTER — Ambulatory Visit (INDEPENDENT_AMBULATORY_CARE_PROVIDER_SITE_OTHER): Payer: Medicaid Other | Admitting: Orthopedic Surgery

## 2021-12-20 DIAGNOSIS — S83282D Other tear of lateral meniscus, current injury, left knee, subsequent encounter: Secondary | ICD-10-CM | POA: Diagnosis not present

## 2021-12-20 DIAGNOSIS — S83242D Other tear of medial meniscus, current injury, left knee, subsequent encounter: Secondary | ICD-10-CM

## 2021-12-20 NOTE — Progress Notes (Incomplete)
ADVANCED HF CLINIC NOTE  Patient ID: Charles Cervantes, male   DOB: 1960-09-29, 61 y.o.   MRN: [id number]  PCP: [name], MD HF Cardiologist: [name]  HPI: Charles Cervantes is a 61 y.o. male with systolic heart failure diagnosed in 04/2012 in [place] secondary to NICM (normal cors on cath), EF 20-25%. As well as polysubstance abuse (cocaine, tobacco and alcohol)  He moved from [facility] and was admitted to [facility] in 4/14 with progressive dyspnea and orthopnea.  Echo on admit EF 20-25% with normal RV.  Discharge weight 147 pounds.  He also had NSVT while in house and we continued his Life[place] placed in [place].  Admitted To [facility] in 8/14 with HF. EF reported 50-55% on echo. On 10/2012 bedside echo in our HF Clinic EF 35-40%. In 11/2012 presented to clinic c/o recurrent CP and severe HF symptoms but seemed well compensated on exam R/L cath showed normal cors.   He was lost to f/u in 2016.   Readmitted to [facility] 9/14-17/21 with severe HTN and recurrent CP anfd HF. Reported he was taking his HF meds except his fluid pill. BP on arrival 163/118. Diuresed and BP controlled.   UDS + for cocaine, THC and opiates.   Echo 09/18/19 EF 20-25% RV mildly HK   Post hospital f/u 9/21 enrolled back in HF Clinic (previously discharged due to inappropriate behavior towards staff).  Been in two treatment centers in [place] at [facility] 2/9-03/11/20. Then relapsed and went to [facility] 3/22.   Follow up 11/22 mildly volume up, lasix increased to 60 mg daily. BP elevated. He cancelled his follow up.  Today he returns for HF follow up. He is more SOB x 1 week and + orthopnea. He tells me he can walk on flat ground OK and can go up stairs, but has to take his time. He is having some light headedness, no falls.  Denies CP, palpitations, edema, or abnormal bleeding. Appetite ok. No fever or chills. Weight at home 182 pounds. Taking all medications. Drinking a lot of fluid and says he is probably eating more salty foods.  Working part time. Smoking 1/2 ppd. Continues to live at [facility]. He is planning to take his driver's license test after he gets his own place. He remains drug-free.  ECHO  04/2012 EF 20-25% 07/17/12 EF 30-35% 08/2012 EF 50-55% at [facility] 02/27/13 EF 35-40% 7/22 EF 40-45%  RHC/LHC  11/20/12  RA = 6  RV = 33/3/8  PA = 29/9 (18)  PCW = 8  Fick cardiac output/index = 6.4/3.2  PVR = 1.5 WU  FA sat = 95%  PA sat = 72%, 73%  SVC sat 72% Extensive calcification in proximal and mid LAD with only mild intraluminal stenosis with 20-30% stenosis.   CPX 2/15 FVC 2.90 (62%)  FEV1 2.22 (60%) FEV1/FVC 76%  Resting HR: 61 Peak HR: 111 (66% age predicted max HR) BP rest: 146/90 BP peak: 168/86 Peak VO2: 22.6 (64.1% predicted peak VO2) VE/VCO2 slope: 29.4 OUES: 2.49 Peak RER: 0.90  Labs 06/29/12 Potassium 5.4 Creatinine 1.05 07/17/12 Dig Level 0.5 Potassium 4.0 Creatinine 0.95 Pro BNP 244 11/20/12 K 4.0 Creatinine 1.18 12/18/12: K 4.7, Cr 0.96 05/16/13 K 4.5 Creatinine 0.91  ROS: All systems negative except as listed in HPI, PMH and Problem List.  Past Medical History:  Diagnosis Date   Anxiety    CHF (congestive heart failure) (HCC)    takes Furosemide daily   Chronic back pain  Constipation    takes Colace daily   COPD (chronic obstructive pulmonary disease) (HCC)    Spiriva daily   Depression    takes Effexor daily and Risperdal nightly   Dizziness    H/O blood clots 01/03/2010   in leg   Headache    occasionally   Hyperlipidemia    Hypertension    takes Losartan and Coreg daily   Muscle spasm    takes Zizanidine daily as needed   Shortness of breath dyspnea    more with exertion but sometimes notices with lying/sitting   Sleep apnea    Substance abuse (HCC)    Weakness    tingling and numbness   Current Outpatient Medications  Medication Sig Dispense Refill   acetaminophen (TYLENOL) 500 MG tablet Take 2 tablets (1,000 mg total) by mouth every 6 (six) hours  as needed. 30 tablet 0   carvedilol (COREG) 6.25 MG tablet TAKE 1 TABLET(6.25 MG) BY MOUTH TWICE DAILY WITH A MEAL 180 tablet 3   dapagliflozin propanediol (FARXIGA) 10 MG TABS tablet Take 1 tablet (10 mg total) by mouth daily before breakfast. 30 tablet 6   furosemide (LASIX) 40 MG tablet TAKE 1 AND 1/2 TABLETS(60 MG) BY MOUTH DAILY 30 tablet 3   ibuprofen (ADVIL) 400 MG tablet Take 400 mg by mouth every 8 (eight) hours as needed.     isosorbide mononitrate (IMDUR) 60 MG 24 hr tablet Take 1.5 tablets (90 mg total) by mouth daily. 45 tablet 3   losartan (COZAAR) 100 MG tablet Take 1 tablet (100 mg total) by mouth daily. 30 tablet 3   Misc. Devices (GNP DIGITAL WEIGHT SCALE) MISC 1 each by Does not apply route daily. Use to weight yourself daily. 1 each 0   naproxen (NAPROSYN) 500 MG tablet Take 1 tablet (500 mg total) by mouth 2 (two) times daily with a meal. 14 tablet 0   nicotine (NICODERM CQ - DOSED IN MG/24 HOURS) 14 mg/24hr patch Place 1 patch (14 mg total) onto the skin daily. 28 patch 0   nicotine polacrilex (NICORETTE) 2 MG gum Take 1 each (2 mg total) by mouth as needed for smoking cessation. 100 tablet 0   spironolactone (ALDACTONE) 25 MG tablet TAKE 1/2 TABLET(12.5 MG) BY MOUTH DAILY 45 tablet 1   No current facility-administered medications for this visit.   Social History   Tobacco Use   Smoking status: Some Days    Packs/day: 0.25    Years: 20.00    Total pack years: 5.00    Types: Cigarettes   Smokeless tobacco: Never   Tobacco comments:    1-2 cigs per day (08/15/14)  Vaping Use   Vaping Use: Never used  Substance Use Topics   Alcohol use: No    Alcohol/week: 0.0 standard drinks of alcohol    Comment: stopped in april 2014   Drug use: Not Currently    Types: Cocaine   FHx: - Mom died (61s) in MVA when he was 51 months old - Dad murdered (19yo) when he was in 7th grade  - No FHx of HF  There were no vitals taken for this visit.  Wt Readings from Last 3  Encounters:  07/08/21 86.1 kg (189 lb 12.8 oz)  01/25/21 85.1 kg (187 lb 9.6 oz)  12/02/20 85.3 kg (188 lb)   PHYSICAL EXAM: General:  NAD. No resp difficulty HEENT: Normal Neck: Supple. No JVD. Carotids 2+ bilat; no bruits. No lymphadenopathy or thryomegaly appreciated. Cor: PMI nondisplaced.  Regular rate & rhythm. No rubs, gallops or murmurs. Lungs: Clear Abdomen: Soft, nontender, nondistended. No hepatosplenomegaly. No bruits or masses. Good bowel sounds. Extremities: No cyanosis, clubbing, rash, edema Neuro: Alert & oriented x 3, cranial nerves grossly intact. Moves all 4 extremities w/o difficulty. Affect pleasant.  ReDs: 47%  ASSESSMENT & PLAN:  1) Chronic systolic HF/NICM:  - Echo 02/2013 EF ~35-40% - NYHA II symptoms.  - Cath 11/14 normal cors - Echo 9/21 EF 20-25% RV mildly HK in setting of severe HTN - Echo 7/22 EF 40-45%, RV ok. - NYHA II-early III symptoms, volume looks OK on exam, weight down 1 lbs, but ReDs 47%. I suspect at least some of his dyspnea is from COPD - Increase Lasix to 60 mg am/40 mg pm x 3 days, then back to Lasix 60 mg daily. - Continue Farxiga 10 daily. - Continue carvedilol 6.25 mg bid. HR 59 - Continue losartan 100 daily (no Entresto due to angiodema with ACEi). - Continue spiro 12.5 mg daily. Won't increase further with borderline high K. - Increase Imdur to 90 mg daily. - BMET/BNP today, repeat BMET in 2 weeks.  2) HTN - Elevated today. - Diurese as above. - Increase Imdur. Call if this causes HA, would then back down and add low-dose hydralazine (did not add hydral today with dizziness. He did say he would be willing to try tid med).  3) Polysubstance abuse - At [place] abstinent, congratulated on > 1 year sober!  4) Current smoker - Encouraged cessation.  5) OSA  - Per Dr Elsworth Soho. Not using CPAP.  - Encouraged compliance.   Instructed to notify office if symptoms remain after pulse of diuretics. May need to repeat  echo +/- CPX. Follow up with [name] in 2 months   [place], [place] 12/20/21

## 2021-12-22 DIAGNOSIS — S83242D Other tear of medial meniscus, current injury, left knee, subsequent encounter: Secondary | ICD-10-CM

## 2021-12-22 DIAGNOSIS — S83282D Other tear of lateral meniscus, current injury, left knee, subsequent encounter: Secondary | ICD-10-CM

## 2021-12-25 ENCOUNTER — Encounter: Payer: Self-pay | Admitting: Orthopedic Surgery

## 2021-12-25 MED ORDER — METHYLPREDNISOLONE ACETATE 40 MG/ML IJ SUSP
40.0000 mg | INTRAMUSCULAR | Status: AC | PRN
  Administered 2021-12-22: 40 mg via INTRA_ARTICULAR

## 2021-12-25 MED ORDER — LIDOCAINE HCL 1 % IJ SOLN
5.0000 mL | INTRAMUSCULAR | Status: AC | PRN
  Administered 2021-12-22: 5 mL

## 2021-12-25 MED ORDER — BUPIVACAINE HCL 0.25 % IJ SOLN
4.0000 mL | INTRAMUSCULAR | Status: AC | PRN
  Administered 2021-12-22: 4 mL via INTRA_ARTICULAR

## 2021-12-25 NOTE — Progress Notes (Signed)
Office Visit Note   Patient: Charles Cervantes           Date of Birth: Sep 06, 1960           MRN: [id number] Visit Date: 12/20/2021 Requested by: [name], MD [place],  [place] [address] PCP: [name], MD  Subjective: Chief Complaint  Patient presents with   Left Knee - Pain    HPI: Charles Cervantes is a 61 y.o. male who presents to the office reporting left knee pain.  Swelling comes and goes.  Wants to push back the surgery to the end of January or 1 February.  We will reschedule that to the beginning of February.  Aspiration and injection of the left knee is performed today.  He does have a significant effusion present..                ROS: All systems reviewed are negative as they relate to the chief complaint within the history of present illness.  Patient denies fevers or chills.  Assessment & Plan: Visit Diagnoses:  1. Acute medial meniscal tear, left, subsequent encounter   2. Acute lateral meniscus tear of left knee, subsequent encounter     Plan: Impression is left knee pain and swelling with meniscal pathology which is known.  Plan is aspiration and injection of the left knee.  Reschedule surgery for February.  All questions answered.  Aspirated about 30 to 40 cc from the knee today.  Follow-Up Instructions: No follow-ups on file.   Orders:  No orders of the defined types were placed in this encounter.  No orders of the defined types were placed in this encounter.     Procedures: Large Joint Inj: L knee on 12/22/2021 6:02 PM Indications: diagnostic evaluation, joint swelling and pain Details: 18 G 1.5 in needle, superolateral approach  Arthrogram: No  Medications: 5 mL lidocaine 1 %; 40 mg methylPREDNISolone acetate 40 MG/ML; 4 mL bupivacaine 0.25 % Outcome: tolerated well, no immediate complications Procedure, treatment alternatives, risks and benefits explained, specific risks discussed. Consent was given by the patient. Immediately  prior to procedure a time out was called to verify the correct patient, procedure, equipment, support staff and site/side marked as required. Patient was prepped and draped in the usual sterile fashion.       Clinical Data: No additional findings.  Objective: Vital Signs: There were no vitals taken for this visit.  Physical Exam:  Constitutional: Patient appears well-developed HEENT:  Head: Normocephalic Eyes:EOM are normal Neck: Normal range of motion Cardiovascular: Normal rate Pulmonary/chest: Effort normal Neurologic: Patient is alert Skin: Skin is warm Psychiatric: Patient has normal mood and affect  Ortho Exam: Left knee demonstrates normal gait and alignment.  Mild to moderate effusion is present.  Extensor mechanism intact.  Collateral crucial ligaments are stable.  No masses lymphadenopathy or skin changes noted in that left knee region.  Specialty Comments:  No specialty comments available.  Imaging: No results found.   PMFS History: Patient Active Problem List   Diagnosis Date Noted   Pain in left knee 04/26/2021   Cubital tunnel syndrome on right 08/25/2020   Sleep concern 08/25/2020   Foot pain, left 06/16/2020   Impingement syndrome of left shoulder 06/16/2020   Encounter for hepatitis C virus screening test for high risk patient 06/16/2020   NICM (nonischemic cardiomyopathy) ([place]) 12/04/2015   Lumbar back pain 09/03/2014   Spinal stenosis in cervical region 04/21/2014   PTSD (post-traumatic stress disorder)  11/04/2013   HTN (hypertension) 01/01/2013   Tobacco use disorder 24/26/8341   Chronic systolic heart failure ([place]) 05/08/2012   Alcohol abuse 05/01/2012   Cocaine abuse ([place]) 05/01/2012   Past Medical History:  Diagnosis Date   Anxiety    CHF (congestive heart failure) (HCC)    takes Furosemide daily   Chronic back pain    Constipation    takes Colace daily   COPD (chronic obstructive pulmonary disease) ([address])    Spiriva daily    Depression    takes Effexor daily and Risperdal nightly   Dizziness    H/O blood clots 01/03/2010   in leg   Headache    occasionally   Hyperlipidemia    Hypertension    takes Losartan and Coreg daily   Muscle spasm    takes Zizanidine daily as needed   Shortness of breath dyspnea    more with exertion but sometimes notices with lying/sitting   Sleep apnea    Substance abuse (HCC)    Weakness    tingling and numbness    Family History  Problem Relation Age of Onset   Prostate cancer Paternal Uncle    Colon cancer Neg Hx    Colon polyps Neg Hx    Esophageal cancer Neg Hx    Rectal cancer Neg Hx    Stomach cancer Neg Hx     Past Surgical History:  Procedure Laterality Date   LEFT AND RIGHT HEART CATHETERIZATION WITH CORONARY ANGIOGRAM N/A 11/20/2012   Procedure: LEFT AND RIGHT HEART CATHETERIZATION WITH CORONARY ANGIOGRAM;  Surgeon: Jolaine Artist, MD;  Location: [facility] CATH LAB;  Service: Cardiovascular;  Laterality: N/A;   Left Second finger surgery     LUMBAR FUSION  09/05/2014   L5 S1   Social History   Occupational History   Occupation: unemployed  Tobacco Use   Smoking status: Some Days    Packs/day: 0.25    Years: 20.00    Total pack years: 5.00    Types: Cigarettes   Smokeless tobacco: Never   Tobacco comments:    1-2 cigs per day (08/15/14)  Vaping Use   Vaping Use: Never used  Substance and Sexual Activity   Alcohol use: No    Alcohol/week: 0.0 standard drinks of alcohol    Comment: stopped in april 2014   Drug use: Not Currently    Types: Cocaine   Sexual activity: Never

## 2021-12-30 ENCOUNTER — Telehealth: Payer: Self-pay | Admitting: Orthopedic Surgery

## 2021-12-30 NOTE — Telephone Encounter (Signed)
Patient states that his surgery date was cancelled an to pleas call he has more questions..[phone]

## 2021-12-31 NOTE — Pre-Procedure Instructions (Signed)
Surgical Instructions    Your procedure is scheduled on Tuesday, February 13th.  Report to [facility] Main Entrance "A" at 05:30 A.M., then check in with the Admitting office.  Call this number if you have problems the morning of surgery:  [phone]  If you have any questions prior to your surgery date call [phone]: Open Monday-Friday 8am-4pm If you experience any cold or flu symptoms such as cough, fever, chills, shortness of breath, etc. between now and your scheduled surgery, please notify us at the above number.     Remember:  Do not eat after midnight the night before your surgery  You may drink clear liquids until 04:30 AM the morning of your surgery.   Clear liquids allowed are: Water, Non-Citrus Juices (without pulp), Carbonated Beverages, Clear Tea, Black Coffee Only (NO MILK, CREAM OR POWDERED CREAMER of any kind), and Gatorade.   Patient Instructions  The night before surgery:  No food after midnight. ONLY clear liquids after midnight  The day of surgery (if you do NOT have diabetes):  Drink ONE (1) Pre-Surgery Clear Ensure by 04:30 AM the morning of surgery. Drink in one sitting. Do not sip.  This drink was given to you during your hospital  pre-op appointment visit.  Nothing else to drink after completing the  Pre-Surgery Clear Ensure.          If you have questions, please contact your surgeon's office.     Take these medicines the morning of surgery with A SIP OF WATER  carvedilol (COREG)  isosorbide mononitrate (IMDUR)    If needed: acetaminophen (TYLENOL)    As of today, STOP taking any Aspirin (unless otherwise instructed by your surgeon) Aleve, Naproxen, Ibuprofen, Motrin, Advil, Goody's, BC's, all herbal medications, fish oil, and all vitamins.   Hold dapagliflozin propanediol (FARXIGA) for 72 hours prior to surgery. Last dose will be 2/9.                  Do NOT Smoke (Tobacco/Vaping) for 24 hours prior to your procedure.  If you use a  CPAP at night, you may bring your mask/headgear for your overnight stay.   Contacts, glasses, piercing's, hearing aid's, dentures or partials may not be worn into surgery, please bring cases for these belongings.    For patients admitted to the hospital, discharge time will be determined by your treatment team.   Patients discharged the day of surgery will not be allowed to drive home, and someone needs to stay with them for 24 hours.  SURGICAL WAITING ROOM VISITATION Patients having surgery or a procedure may have no more than 2 support people in the waiting area - these visitors may rotate.   Children under the age of 35 must have an adult with them who is not the patient. If the patient needs to stay at the hospital during part of their recovery, the visitor guidelines for inpatient rooms apply. Pre-op nurse will coordinate an appropriate time for 1 support person to accompany patient in pre-op.  This support person may not rotate.   Please refer to the [facility] website for the visitor guidelines for Inpatients (after your surgery is over and you are in a regular room).    Special instructions:   [place]- Preparing For Surgery  Before surgery, you can play an important role. Because skin is not sterile, your skin needs to be as free of germs as possible. You can reduce the number of germs on your skin by washing  with CHG (chlorahexidine gluconate) Soap before surgery.  CHG is an antiseptic cleaner which kills germs and bonds with the skin to continue killing germs even after washing.    Oral Hygiene is also important to reduce your risk of infection.  Remember - BRUSH YOUR TEETH THE MORNING OF SURGERY WITH YOUR REGULAR TOOTHPASTE  Please do not use if you have an allergy to CHG or antibacterial soaps. If your skin becomes reddened/irritated stop using the CHG.  Do not shave (including legs and underarms) for at least 48 hours prior to first CHG shower. It is OK to shave your  face.  Please follow these instructions carefully.   Shower the NIGHT BEFORE SURGERY and the MORNING OF SURGERY  If you chose to wash your hair, wash your hair first as usual with your normal shampoo.  After you shampoo, rinse your hair and body thoroughly to remove the shampoo.  Use CHG Soap as you would any other liquid soap. You can apply CHG directly to the skin and wash gently with a scrungie or a clean washcloth.   Apply the CHG Soap to your body ONLY FROM THE NECK DOWN.  Do not use on open wounds or open sores. Avoid contact with your eyes, ears, mouth and genitals (private parts). Wash Face and genitals (private parts)  with your normal soap.   Wash thoroughly, paying special attention to the area where your surgery will be performed.  Thoroughly rinse your body with warm water from the neck down.  DO NOT shower/wash with your normal soap after using and rinsing off the CHG Soap.  Pat yourself dry with a CLEAN TOWEL.  Wear CLEAN PAJAMAS to bed the night before surgery  Place CLEAN SHEETS on your bed the night before your surgery  DO NOT SLEEP WITH PETS.   Day of Surgery: Take a shower with CHG soap. Do not wear jewelry  Do not wear lotions, powders, colognes, or deodorant. Men may shave face and neck. Do not bring valuables to the hospital. [facility] is not responsible for any belongings or valuables.  Wear Clean/Comfortable clothing the morning of surgery Remember to brush your teeth WITH YOUR REGULAR TOOTHPASTE.   Please read over the following fact sheets that you were given.    If you received a COVID test during your pre-op visit  it is requested that you wear a mask when out in public, stay away from anyone that may not be feeling well and notify your surgeon if you develop symptoms. If you have been in contact with anyone that has tested positive in the last 10 days please notify you surgeon.

## 2022-01-04 ENCOUNTER — Inpatient Hospital Stay (HOSPITAL_COMMUNITY)
Admission: RE | Admit: 2022-01-04 | Discharge: 2022-01-04 | Disposition: A | Discharge status: Home / Self Care | Payer: Medicaid Other | Source: Ambulatory Visit

## 2022-01-13 DIAGNOSIS — Z01818 Encounter for other preprocedural examination: Secondary | ICD-10-CM

## 2022-01-20 ENCOUNTER — Telehealth: Payer: Self-pay | Admitting: Orthopedic Surgery

## 2022-01-20 NOTE — Telephone Encounter (Signed)
Patient needs to speak with the doctor or his assistant about home health.

## 2022-01-21 ENCOUNTER — Encounter: Payer: Medicaid Other | Admitting: Orthopedic Surgery

## 2022-01-21 NOTE — Telephone Encounter (Signed)
IC s/w patient and advised typically do not give orders for [facility] aide after arthroscopy surgery.

## 2022-02-04 NOTE — Pre-Procedure Instructions (Signed)
Surgical Instructions    Your procedure is scheduled on Tuesday, February 15, 2022 at 7:30 am.  Report to [facility] Main Entrance "A" at 5:30 A.M., then check in with the Admitting office.  Call this number if you have problems the morning of surgery:  [phone]   If you have any questions prior to your surgery date call [phone]: Open Monday-Friday 8am-4pm If you experience any cold or flu symptoms such as cough, fever, chills, shortness of breath, etc. between now and your scheduled surgery, please notify us at the above number     Remember:  Do not eat after midnight the night before your surgery  You may drink clear liquids until 4:30 am the morning of your surgery.   Clear liquids allowed are: Water, Non-Citrus Juices (without pulp), Carbonated Beverages, Clear Tea, Black Coffee ONLY (NO MILK, CREAM OR POWDERED CREAMER of any kind), and Gatorade   Enhanced Recovery after Surgery for Orthopedics Enhanced Recovery after Surgery is a protocol used to improve the stress on your body and your recovery after surgery.  Patient Instructions  The day of surgery (if you do NOT have diabetes):  Drink ONE (1) Pre-Surgery Clear Ensure by 4:30 am the morning of surgery   This drink was given to you during your hospital  pre-op appointment visit. Nothing else to drink after completing the  Pre-Surgery Clear Ensure.          If you have questions, please contact your surgeon's office.     Take these medicines the morning of surgery with A SIP OF WATER:   carvedilol (COREG)   isosorbide   acetaminophen (TYLENOL) -if needed   Hold dapagliflozin propanediol (FARXIGA) 3 days prior to surgery-last dose on 02/11/22   As of today, STOP taking any Aspirin (unless otherwise instructed by your surgeon) Aleve, Naproxen, Ibuprofen, Motrin, Advil, Goody's, BC's, all herbal medications, fish oil, and all vitamins.           Do not wear jewelry. Do not wear lotions, powders,cologne or  deodorant. Men may shave face and neck. Do not bring valuables to the hospital. Do not wear nail polish, gel polish, artificial nails, or any other type of covering on natural nails (fingers and toes) If you have artificial nails or gel coating that need to be removed by a nail salon, please have this removed prior to surgery. Artificial nails or gel coating may interfere with anesthesia's ability to adequately monitor your vital signs.  [facility] is not responsible for any belongings or valuables.    Do NOT Smoke (Tobacco/Vaping)  24 hours prior to your procedure  If you use a CPAP at night, you may bring your mask for your overnight stay.   Contacts, glasses, hearing aids, dentures or partials may not be worn into surgery, please bring cases for these belongings   For patients admitted to the hospital, discharge time will be determined by your treatment team.   Patients discharged the day of surgery will not be allowed to drive home, and someone needs to stay with them for 24 hours.   SURGICAL WAITING ROOM VISITATION Patients having surgery or a procedure may have no more than 2 support people in the waiting area - these visitors may rotate.   Children under the age of 78 must have an adult with them who is not the patient. If the patient needs to stay at the hospital during part of their recovery, the visitor guidelines for inpatient rooms apply. Pre-op nurse  will coordinate an appropriate time for 1 support person to accompany patient in pre-op.  This support person may not rotate.   Please refer to [email or web address] for the visitor guidelines for Inpatients (after your surgery is over and you are in a regular room).    Special instructions:    Oral Hygiene is also important to reduce your risk of infection.  Remember - BRUSH YOUR TEETH THE MORNING OF SURGERY WITH YOUR REGULAR TOOTHPASTE   [facility]- Preparing For  Surgery  Before surgery, you can play an important role. Because skin is not sterile, your skin needs to be as free of germs as possible. You can reduce the number of germs on your skin by washing with CHG (chlorahexidine gluconate) Soap before surgery.  CHG is an antiseptic cleaner which kills germs and bonds with the skin to continue killing germs even after washing.     Please do not use if you have an allergy to CHG or antibacterial soaps. If your skin becomes reddened/irritated stop using the CHG.  Do not shave (including legs and underarms) for at least 48 hours prior to first CHG shower. It is OK to shave your face.  Please follow these instructions carefully.     Shower the NIGHT BEFORE SURGERY and the MORNING OF SURGERY with CHG Soap.   If you chose to wash your hair, wash your hair first as usual with your normal shampoo. After you shampoo, rinse your hair and body thoroughly to remove the shampoo.  Then [employer] and genitals (private parts) with your normal soap and rinse thoroughly to remove soap.  After that Use CHG Soap as you would any other liquid soap. You can apply CHG directly to the skin and wash gently with a scrungie or a clean washcloth.   Apply the CHG Soap to your body ONLY FROM THE NECK DOWN.  Do not use on open wounds or open sores. Avoid contact with your eyes, ears, mouth and genitals (private parts). Wash Face and genitals (private parts)  with your normal soap.   Wash thoroughly, paying special attention to the area where your surgery will be performed.  Thoroughly rinse your body with warm water from the neck down.  DO NOT shower/wash with your normal soap after using and rinsing off the CHG Soap.  Pat yourself dry with a CLEAN TOWEL.  Wear CLEAN PAJAMAS to bed the night before surgery  Place CLEAN SHEETS on your bed the night before your surgery  DO NOT SLEEP WITH PETS.   Day of Surgery:  Take a shower with CHG soap. Wear Clean/Comfortable  clothing the morning of surgery Do not apply any deodorants/lotions.   Remember to brush your teeth WITH YOUR REGULAR TOOTHPASTE.    If you received a COVID test during your pre-op visit, it is requested that you wear a mask when out in public, stay away from anyone that may not be feeling well, and notify your surgeon if you develop symptoms. If you have been in contact with anyone that has tested positive in the last 10 days, please notify your surgeon.    Please read over the following fact sheets that you were given.

## 2022-02-07 ENCOUNTER — Encounter (HOSPITAL_COMMUNITY): Payer: Self-pay

## 2022-02-07 ENCOUNTER — Other Ambulatory Visit: Payer: Self-pay

## 2022-02-07 ENCOUNTER — Encounter (HOSPITAL_COMMUNITY)
Admission: RE | Admit: 2022-02-07 | Discharge: 2022-02-07 | Disposition: A | Discharge status: Home / Self Care | Payer: Medicaid Other | Source: Ambulatory Visit | Attending: Orthopedic Surgery | Admitting: Orthopedic Surgery

## 2022-02-07 ENCOUNTER — Encounter (HOSPITAL_COMMUNITY): Payer: Self-pay | Admitting: Physician Assistant

## 2022-02-07 VITALS — BP 153/102 | HR 71 | Temp 97.5°F | Ht 73.5 in | Wt 184.6 lb

## 2022-02-07 DIAGNOSIS — I110 Hypertensive heart disease with heart failure: Secondary | ICD-10-CM | POA: Diagnosis not present

## 2022-02-07 DIAGNOSIS — E785 Hyperlipidemia, unspecified: Secondary | ICD-10-CM | POA: Insufficient documentation

## 2022-02-07 DIAGNOSIS — I493 Ventricular premature depolarization: Secondary | ICD-10-CM | POA: Diagnosis not present

## 2022-02-07 DIAGNOSIS — I5022 Chronic systolic (congestive) heart failure: Secondary | ICD-10-CM | POA: Diagnosis not present

## 2022-02-07 DIAGNOSIS — G4733 Obstructive sleep apnea (adult) (pediatric): Secondary | ICD-10-CM | POA: Insufficient documentation

## 2022-02-07 DIAGNOSIS — S83242A Other tear of medial meniscus, current injury, left knee, initial encounter: Secondary | ICD-10-CM | POA: Insufficient documentation

## 2022-02-07 DIAGNOSIS — J449 Chronic obstructive pulmonary disease, unspecified: Secondary | ICD-10-CM | POA: Insufficient documentation

## 2022-02-07 DIAGNOSIS — Z01818 Encounter for other preprocedural examination: Secondary | ICD-10-CM | POA: Diagnosis present

## 2022-02-07 LAB — BASIC METABOLIC PANEL
Anion gap: 7 (ref 5–15)
BUN: 9 mg/dL (ref 6–20)
CO2: 26 mmol/L (ref 22–32)
Calcium: 8.9 mg/dL (ref 8.9–10.3)
Chloride: 105 mmol/L (ref 98–111)
Creatinine, Ser: 1.26 mg/dL — ABNORMAL HIGH (ref 0.61–1.24)
GFR, Estimated: >60 mL/min (ref >60)
Glucose, Bld: 104 mg/dL — ABNORMAL HIGH (ref 70–99)
Potassium: 4.3 mmol/L (ref 3.5–5.1)
Sodium: 138 mmol/L (ref 135–145)

## 2022-02-07 LAB — CBC
HCT: 48 % (ref 39.0–52.0)
Hemoglobin: 16.1 g/dL (ref 13.0–17.0)
MCH: 30.4 pg (ref 26.0–34.0)
MCHC: 33.5 g/dL (ref 30.0–36.0)
MCV: 90.6 fL (ref 80.0–100.0)
Platelets: 226 10*3/uL (ref 150–400)
RBC: 5.3 MIL/uL (ref 4.22–5.81)
RDW: 13.4 % (ref 11.5–15.5)
WBC: 4.9 10*3/uL (ref 4.0–10.5)
nRBC: 0 % (ref 0.0–0.2)

## 2022-02-07 NOTE — Progress Notes (Addendum)
PCP - Dr.Aheleigh Rock Nephew Cardiologist - Dr.Daniel Bensimhon  PPM/ICD - pt denies Device Orders - n/a Rep Notified - n/a  Chest x-ray - n/a EKG - 02/07/22 Stress Test - 02/27/13 ECHO - 07/09/20 Cardiac Cath - 11/20/2012  Sleep Study - yes CPAP - pt states he does not have a CPAP at home  Fasting Blood Sugar - pt denies Diabetic  Checks Blood Sugar _____ times a day  Last dose of GLP1 agonist-  pt denies GLP1 instructions: n/a  Blood Thinner Instructions:pt denies Aspirin Instructions:pt denies  ERAS Protcol -yes  PRE-SURGERY Ensure given to pt at PAT visit today  COVID TEST- n/a   Anesthesia review: YES, heart history. Abnormal EKG today. Pt states he was not told to get cardiac clearance.   Patient denies shortness of breath, fever, cough and chest pain at PAT appointment   All instructions explained to the patient, with a verbal understanding of the material. Patient agrees to go over the instructions while at home for a better understanding. Patient also instructed to self quarantine after being tested for COVID-19. The opportunity to ask questions was provided.

## 2022-02-08 NOTE — Progress Notes (Signed)
Anesthesia Chart Review:  Case: [id number] Date/Time: 02/15/22 0715   Procedure: LEFT KNEE ANTERIOR CRUCIATE LIGAMENT (ACL) RECONSTRUCTION WITH ALLOGRAFT, BONE PATELLA TENDON BONE AND BONE MARROW ASPIRATE, PARTIAL MEDIAL MENISCECTOMY (Left)   Anesthesia type: General   Pre-op diagnosis: left knee anterior cructiate ligament tear, medial meniscal tear   Location: [facility] OR ROOM 06 / [place] [facility]   Surgeons: Meredith Pel, MD       DISCUSSION: Patient is a 62 year old male scheduled for the above procedure.  History includes smoking, HLD, HTN, CHF (chronic systolic CHF 9735), non-ischemic cardiomyopathy (2014), "blood clots" in leg (DVT? 2012), COPD, dyspnea, OSA (not currently using CPAP), chronic back pain, depression, anxiety, polysubstance abuse (cocaine, ETOH, documented as sober since 12/18/19), spinal surgery (L5-S1 PLIF 09/03/14).  Last cardiology note was on 01/25/21 with Allena Katz, NP at the HF Clinic. He was more SOB then with orthopnea. Volume status appeared okay, and COPD possibly a contributing factor. At that time was living at [facility] and was drug free. Imdur increased for HTN and Lasix increased x 3 days for dyspnea. Two month follow-up recommended and would determine if additional testing warranted at that time depending on how he was doing.   Medications include Coreg 6.25 mg BID, Lasix 40 mg daily, spironolactone 12/5 mg daily. Last 3 BP readings ~ 153-164/90-108. He is > 1 year out from seeing cardiology. He was EKG changes, although T wave abnormality has been present intermittently. Last EF 40-45% in 2022. Recommend preoperative cardiology input. Message sent to [place] at Dr. Randel Pigg office.    VS: BP (!) 153/102 Comment: patient took BP medication at 0730 am today per pt  Pulse 71   Temp (!) 36.4 C   Ht 6' 1.5" (1.867 m)   Wt 83.7 kg   SpO2 97%   BMI 24.02 kg/m  BP Readings from Last 3 Encounters:  02/07/22 (!) 153/102  07/08/21 (!) 158/90  04/20/21 (!)  164/108     PROVIDERS: [name], MD is PCP  - Glori Bickers, MD is HF cardiologist. Of note, he had previously been discharged from the HF Clinic due to inappropriate behavior towards staff, but had completed rehab in [place] and [place] in 2022 and was re-established in April 2022 with open discussion that he would need to remain respectful to staff.  Debara Pickett, Chrissie Noa, MD is primary cardiologist, although he has only had HF follow-up in > 2 two years.   Kara Mead, MD is pulmonologist ([facility]). Last visit 02/04/15.   LABS: Labs reviewed: Acceptable for surgery. (all labs ordered are listed, but only abnormal results are displayed)  Labs Reviewed  BASIC METABOLIC PANEL - Abnormal; Notable for the following components:      Result Value   Glucose, Bld 104 (*)    Creatinine, Ser 1.26 (*)    All other components within normal limits  CBC    OTHER: Split Night CPAP Sleep Study 02/04/15: IMPRESSION :   - Moderate obstructive sleep apnea occurred during the diagnostic portion of the study(AHI = 18.2/hour). An optimal PAP pressure was selected for this patient ( 11 cm of water) - No significant central sleep apnea occurred during the diagnostic portion of the study (CAI = 0.4/hour). - The patient had minimal or no oxygen desaturation during the diagnostic portion of the study (Min O2 = 87.00%) - The patient snored with Moderate snoring volume during the diagnostic portion of the study. - EKG findings include PVCs. - Clinically significant periodic  limb movements did not occur during sleep.  RECOMMENDATIONS - Trial of CPAP therapy on 11 cm H2O with a Medium size Resmed Full Face Mask AirFit F20 mask and heated humidification. - Consider an oral guard for Bruxism...   Spirometry 09/29/14: FVC 2.60 (57%), FEV1 2.13 (60%), FEV1/FVC 82% (105%), Moderately severe restriction.    IMAGES: MRI Left Knee 08/16/21: IMPRESSION: 1. Medial meniscal tear. 2. Femoral attachment of the ACL  is ill-defined with some ligamentous laxity suggestive of a subacute tear. 3. Grade 1 MCL sprain. 4. Moderate sized joint effusion. Frond-like area of thickening along the synovial surface posteriorly within the suprapatellar pouch may reflect focal synovitis versus lipoma arborescens. 5. Mild medial and patellofemoral compartment osteoarthritis.    EKG: EKG 02/07/22: Sinus bradycardia Possible Left atrial enlargement Left ventricular hypertrophy with repolarization abnormality ( R in aVL , Sokolow-Lyon , Cornell product , Romhilt-Estes ) Cannot rule out Septal infarct , age undetermined Abnormal ECG When compared with ECG of 02-Dec-2020 14:00, Since last tracing Sinus bradycardia and T wave abnormality is more prominent in V4-6 Confirmed by Shelva Majestic [phone]) on 02/07/2022 12:42:38 PM - He has intermittently had lateral T wave inversion.    CV: Echo 07/09/20 IMPRESSIONS   1. Left ventricular ejection fraction, by estimation, is 40 to 45%. The  left ventricle has mildly decreased function. The left ventricle  demonstrates global hypokinesis. There is mild left ventricular  hypertrophy. Left ventricular diastolic parameters  were normal.   2. Right ventricular systolic function is normal. The right ventricular  size is normal. Tricuspid regurgitation signal is inadequate for assessing  PA pressure.   3. The mitral valve is normal in structure. Trivial mitral valve  regurgitation.   4. The aortic valve is tricuspid. Aortic valve regurgitation is not  visualized. No aortic stenosis is present.   5. The inferior vena cava is normal in size with greater than 50%  respiratory variability, suggesting right atrial pressure of 3 mmHg.  - Comparison LVEF 20-25% 09/18/19, LVEF 55-60% 07/02/14, LVEF 35-40% 02/27/13   CPX 02/27/13 FVC 2.90 (62%)  FEV1 2.22 (60%) FEV1/FVC 76%  Resting HR: 61 Peak HR: 111 (66% age predicted max HR) BP rest: 146/90 BP peak: 168/86 Peak VO2: 22.6 (64.1%  predicted peak VO2) VE/VCO2 slope: 29.4 OUES: 2.49 Peak RER: 0.90Conclusion: Evaluation of the the exercise test with gas exchange is limited due to the submaximal aerobic exercise, but the patient's functional capacity appears to be normal (or at worst mildly Reduced.) The low HR and normal O2pulse suggests a slow chronotropic response. There was no other signs of a circulatory limitation. There were no signs of a ventilatory limitation despite mild COPD on resting spirometry.   RHC/LHC 11/20/12  RA = 6   RV = 33/3/8   PA = 29/9 (18)   PCW = 8   Fick cardiac output/index = 6.4/3.2   PVR = 1.5 WU   FA sat = 95%   PA sat = 72%, 73%   SVC sat 72% Extensive calcification in proximal and mid LAD with only mild intraluminal stenosis with 20-30% stenosis.      Past Medical History:  Diagnosis Date   Anxiety    CHF (congestive heart failure) (HCC)    takes Furosemide daily   Chronic back pain    Constipation    takes Colace daily   COPD (chronic obstructive pulmonary disease) (HCC)    Spiriva daily   Depression    takes Effexor daily and Risperdal nightly  Dizziness    H/O blood clots 01/03/2010   in leg   Headache    occasionally   Hyperlipidemia    Hypertension    takes Losartan and Coreg daily   Muscle spasm    takes Zizanidine daily as needed   Shortness of breath dyspnea    more with exertion but sometimes notices with lying/sitting   Sleep apnea    Substance abuse (HCC)    Weakness    tingling and numbness    Past Surgical History:  Procedure Laterality Date   LEFT AND RIGHT HEART CATHETERIZATION WITH CORONARY ANGIOGRAM N/A 11/20/2012   Procedure: LEFT AND RIGHT HEART CATHETERIZATION WITH CORONARY ANGIOGRAM;  Surgeon: Jolaine Artist, MD;  Location: [facility] CATH LAB;  Service: Cardiovascular;  Laterality: N/A;   Left Second finger surgery     LUMBAR FUSION  09/05/2014   L5 S1    MEDICATIONS:  acetaminophen (TYLENOL) 500 MG tablet   carvedilol (COREG) 6.25 MG  tablet   furosemide (LASIX) 40 MG tablet   Misc. Devices (GNP DIGITAL WEIGHT SCALE) MISC   spironolactone (ALDACTONE) 25 MG tablet   No current facility-administered medications for this encounter.    Myra Gianotti, PA-C Surgical Short Stay/Anesthesiology [facility] Phone [phone] [facility] Phone [phone] 02/08/2022 3:58 PM

## 2022-02-15 ENCOUNTER — Ambulatory Visit (HOSPITAL_COMMUNITY): Admission: RE | Admit: 2022-02-15 | Payer: Medicaid Other | Source: Home / Self Care | Admitting: Orthopedic Surgery

## 2022-02-15 DIAGNOSIS — Z01818 Encounter for other preprocedural examination: Secondary | ICD-10-CM

## 2022-02-15 SURGERY — RECONSTRUCTION, KNEE, ACL, USING HAMSTRING GRAFT
Anesthesia: General | Laterality: Left

## 2022-02-23 ENCOUNTER — Encounter: Payer: Medicaid Other | Admitting: Orthopedic Surgery

## 2022-02-25 ENCOUNTER — Telehealth (HOSPITAL_COMMUNITY): Payer: Self-pay

## 2022-02-25 NOTE — Telephone Encounter (Signed)
Called to confirm/remind patient of their appointment at the [facility] on 02/28/22.   Patient reminded to bring all medications and/or complete list.  Confirmed patient has transportation. Gave directions, instructed to utilize [place] parking.  Confirmed appointment prior to ending call.

## 2022-02-28 ENCOUNTER — Encounter (HOSPITAL_COMMUNITY): Payer: Medicaid Other

## 2022-02-28 NOTE — Progress Notes (Incomplete)
ADVANCED HF CLINIC NOTE  Patient ID: Charles Cervantes, male   DOB: 1960-09-29, 62 y.o.   MRN: [id number]  PCP: [name], MD HF Cardiologist: [name]  HPI: Charles Cervantes is a 62 y.o. male with systolic heart failure diagnosed in 04/2012 in [place] secondary to NICM (normal cors on cath), EF 20-25%. As well as polysubstance abuse (cocaine, tobacco and alcohol)  He moved from [facility] and was admitted to [facility] in 4/14 with progressive dyspnea and orthopnea.  Echo on admit EF 20-25% with normal RV.  Discharge weight 147 pounds.  He also had NSVT while in house and we continued his Life[facility] placed in [place].  Admitted To [facility] in 8/14 with HF. EF reported 50-55% on echo. On 10/2012 bedside echo in our HF Clinic EF 35-40%. In 11/2012 presented to clinic c/o recurrent CP and severe HF symptoms but seemed well compensated on exam R/L cath showed normal cors.   He was lost to f/u in 2016.   Readmitted to [facility] 9/14-17/21 with severe HTN and recurrent CP anfd HF. Reported he was taking his HF meds except his fluid pill. BP on arrival 163/118. Diuresed and BP controlled.   UDS + for cocaine, THC and opiates.   Echo 09/18/19 EF 20-25% RV mildly HK   Post hospital f/u 9/21 enrolled back in HF Clinic (previously discharged due to inappropriate behavior towards staff).  Been in two treatment centers in [place] at [facility] 2/9-03/11/20. Then relapsed and went to [facility] 3/22.   Follow up 11/22 mildly volume up, lasix increased to 60 mg daily. BP elevated. He cancelled his follow up.  Today he returns for HF follow up. He is more SOB x 1 week and + orthopnea. He tells me he can walk on flat ground OK and can go up stairs, but has to take his time. He is having some light headedness, no falls.  Denies CP, palpitations, edema, or abnormal bleeding. Appetite ok. No fever or chills. Weight at home 182 pounds. Taking all medications. Drinking a lot of fluid and says he is probably eating more salty foods.  Working part time. Smoking 1/2 ppd. Continues to live at [facility]. He is planning to take his driver's license test after he gets his own place. He remains drug-free.  ECHO  04/2012 EF 20-25% 07/17/12 EF 30-35% 08/2012 EF 50-55% at [facility] 02/27/13 EF 35-40% 7/22 EF 40-45%  RHC/LHC  11/20/12  RA = 6  RV = 33/3/8  PA = 29/9 (18)  PCW = 8  Fick cardiac output/index = 6.4/3.2  PVR = 1.5 WU  FA sat = 95%  PA sat = 72%, 73%  SVC sat 72% Extensive calcification in proximal and mid LAD with only mild intraluminal stenosis with 20-30% stenosis.   CPX 2/15 FVC 2.90 (62%)  FEV1 2.22 (60%) FEV1/FVC 76%  Resting HR: 61 Peak HR: 111 (66% age predicted max HR) BP rest: 146/90 BP peak: 168/86 Peak VO2: 22.6 (64.1% predicted peak VO2) VE/VCO2 slope: 29.4 OUES: 2.49 Peak RER: 0.90  Labs 06/29/12 Potassium 5.4 Creatinine 1.05 07/17/12 Dig Level 0.5 Potassium 4.0 Creatinine 0.95 Pro BNP 244 11/20/12 K 4.0 Creatinine 1.18 12/18/12: K 4.7, Cr 0.96 05/16/13 K 4.5 Creatinine 0.91  ROS: All systems negative except as listed in HPI, PMH and Problem List.  Past Medical History:  Diagnosis Date   Anxiety    CHF (congestive heart failure) (HCC)    takes Furosemide daily   Chronic back pain  Constipation    takes Colace daily   COPD (chronic obstructive pulmonary disease) (HCC)    Spiriva daily   Depression    takes Effexor daily and Risperdal nightly   Dizziness    H/O blood clots 01/03/2010   in leg   Headache    occasionally   Hyperlipidemia    Hypertension    takes Losartan and Coreg daily   Muscle spasm    takes Zizanidine daily as needed   Shortness of breath dyspnea    more with exertion but sometimes notices with lying/sitting   Sleep apnea    Substance abuse (HCC)    Weakness    tingling and numbness   Current Outpatient Medications  Medication Sig Dispense Refill   acetaminophen (TYLENOL) 500 MG tablet Take 2 tablets (1,000 mg total) by mouth every 6 (six) hours  as needed. 30 tablet 0   carvedilol (COREG) 6.25 MG tablet TAKE 1 TABLET(6.25 MG) BY MOUTH TWICE DAILY WITH A MEAL 180 tablet 3   furosemide (LASIX) 40 MG tablet TAKE 1 AND 1/2 TABLETS(60 MG) BY MOUTH DAILY (Patient taking differently: Take 40 mg by mouth daily.) 30 tablet 3   Misc. Devices (GNP DIGITAL WEIGHT SCALE) MISC 1 each by Does not apply route daily. Use to weight yourself daily. 1 each 0   spironolactone (ALDACTONE) 25 MG tablet TAKE 1/2 TABLET(12.5 MG) BY MOUTH DAILY 45 tablet 1   No current facility-administered medications for this visit.   Social History   Tobacco Use   Smoking status: Every Day    Packs/day: 0.25    Years: 20.00    Total pack years: 5.00    Types: Cigarettes   Smokeless tobacco: Never   Tobacco comments:    1-2 cigs per day (08/15/14)  Vaping Use   Vaping Use: Every day  Substance Use Topics   Alcohol use: No    Alcohol/week: 0.0 standard drinks of alcohol    Comment: stopped in april 2014   Drug use: Not Currently    Types: Cocaine   FHx: - Mom died (24s) in MVA when he was 61 months old - Dad murdered (68yo) when he was in 7th grade  - No FHx of HF  There were no vitals taken for this visit.  Wt Readings from Last 3 Encounters:  02/07/22 83.7 kg (184 lb 9.6 oz)  07/08/21 86.1 kg (189 lb 12.8 oz)  01/25/21 85.1 kg (187 lb 9.6 oz)   PHYSICAL EXAM: General:  NAD. No resp difficulty HEENT: Normal Neck: Supple. No JVD. Carotids 2+ bilat; no bruits. No lymphadenopathy or thryomegaly appreciated. Cor: PMI nondisplaced. Regular rate & rhythm. No rubs, gallops or murmurs. Lungs: Clear Abdomen: Soft, nontender, nondistended. No hepatosplenomegaly. No bruits or masses. Good bowel sounds. Extremities: No cyanosis, clubbing, rash, edema Neuro: Alert & oriented x 3, cranial nerves grossly intact. Moves all 4 extremities w/o difficulty. Affect pleasant.  ReDs: 47%  ASSESSMENT & PLAN:  1) Chronic systolic HF/NICM:  - Echo 02/2013 EF ~35-40% -  NYHA II symptoms.  - Cath 11/14 normal cors - Echo 9/21 EF 20-25% RV mildly HK in setting of severe HTN - Echo 7/22 EF 40-45%, RV ok. - NYHA II-early III symptoms, volume looks OK on exam, weight down 1 lbs, but ReDs 47%. I suspect at least some of his dyspnea is from COPD - Increase Lasix to 60 mg am/40 mg pm x 3 days, then back to Lasix 60 mg daily. - Continue Farxiga 10 daily. -  Continue carvedilol 6.25 mg bid. HR 59 - Continue losartan 100 daily (no Entresto due to angiodema with ACEi). - Continue spiro 12.5 mg daily. Won't increase further with borderline high K. - Increase Imdur to 90 mg daily. - BMET/BNP today, repeat BMET in 2 weeks.  2) HTN - Elevated today. - Diurese as above. - Increase Imdur. Call if this causes HA, would then back down and add low-dose hydralazine (did not add hydral today with dizziness. He did say he would be willing to try tid med).  3) Polysubstance abuse - At [place] abstinent, congratulated on > 1 year sober!  4) Current smoker - Encouraged cessation.  5) OSA  - Per Dr Elsworth Soho. Not using CPAP.  - Encouraged compliance.   Instructed to notify office if symptoms remain after pulse of diuretics. May need to repeat echo +/- CPX. Follow up with [name] in 2 months   [place],  02/28/22

## 2022-03-04 ENCOUNTER — Telehealth (HOSPITAL_COMMUNITY): Payer: Self-pay

## 2022-03-04 NOTE — Telephone Encounter (Signed)
Called to confirm/remind patient of their appointment at the [facility] on 03/07/22.   Patient reminded to bring all medications and/or complete list.  Confirmed patient has transportation. Gave directions, instructed to utilize [place] parking.  Confirmed appointment prior to ending call.

## 2022-03-07 ENCOUNTER — Encounter (HOSPITAL_COMMUNITY): Payer: Medicaid Other

## 2022-03-07 ENCOUNTER — Telehealth (HOSPITAL_COMMUNITY): Payer: Self-pay | Admitting: Licensed Clinical Social Worker

## 2022-03-07 NOTE — Telephone Encounter (Signed)
H&V Care Navigation CSW Progress Note  Clinical Social Worker consulted to help with transport to 3/7.  Pt has Managed Medicaid but pt does not know how to utilize- CSW will assist in educating him on this during upcoming appt.  CSW set up ride through [place] to get to 3/7 appt.  Patient is participating in a Managed Medicaid Plan:  Yes  [place]   Transportation Needs: Unmet Transportation Needs (03/07/2022)  Depression (PHQ2-9): Low Risk  (07/08/2021)  Tobacco Use: High Risk (02/07/2022)   03/07/2022  Charles Cervantes DOB: 1960/02/24 MRN: [id number]   RIDER WAIVER AND RELEASE OF LIABILITY  For the purposes of helping with transportation needs, [facility] partners with outside transportation providers (taxi companies, [place], Social research officer, government.) to give [employer] patients or other approved people the choice of on-demand rides [employer]") to our buildings for non-emergency visits.  By using [employer], I, the person signing this document, on behalf of myself and/or any legal minors (in my care using the [employer]), agree:  Government social research officer given to me are supplied by independent, outside transportation providers who do not work for, or have any affiliation with, [employer]. [facility] is not a transportation company. [place] has no control over the quality or safety of the rides I get using [employer]. [place] has no control over whether any outside ride will happen on time or not. [place] gives no guarantee on the reliability, quality, safety, or availability on any rides, or that no mistakes will happen. I know and accept that traveling by vehicle (car, truck, SVU, Lucianne Lei, bus, taxi, etc.) has risks of serious injuries such as disability, being paralyzed, and death. I know and agree the risk of using [employer] is mine alone, and not [employer]. Transport Services are provided "as is" and as are available. The transportation providers are in  charge for all inspections and care of the vehicles used to provide these rides. I agree not to take legal action against [facility], its agents, employees, officers, directors, representatives, insurers, attorneys, assigns, successors, subsidiaries, and affiliates at any time for any reasons related directly or indirectly to using [employer]. I also agree not to take legal action against [place] or its affiliates for any injury, death, or damage to property caused by or related to using [employer]. I have read this Waiver and Release of Liability, and I understand the terms used in it and their legal meaning. This Waiver is freely and voluntarily given with the understanding that my right (or any legal minors) to legal action against [place] relating to [employer] is knowingly given up to use these services.   I attest that I read the Ride Waiver and Release of Liability to Charles Cervantes, gave Charles Cervantes the opportunity to ask questions and answered the questions asked (if any). I affirm that Charles Cervantes then provided consent for assistance with transportation.     Jorge Ny

## 2022-03-09 ENCOUNTER — Telehealth (HOSPITAL_COMMUNITY): Payer: Self-pay

## 2022-03-09 NOTE — Telephone Encounter (Signed)
Called to confirm/remind patient of their appointment at the [facility] on 03/10/22.   Patient reminded to bring all medications and/or complete list.  Confirmed patient has transportation. Gave directions, instructed to utilize [place] parking.  Confirmed appointment prior to ending call.

## 2022-03-10 ENCOUNTER — Ambulatory Visit (HOSPITAL_COMMUNITY)
Admission: RE | Admit: 2022-03-10 | Discharge: 2022-03-10 | Disposition: A | Discharge status: Home / Self Care | Payer: Medicaid Other | Source: Ambulatory Visit | Attending: Cardiology | Admitting: Cardiology

## 2022-03-10 ENCOUNTER — Encounter (HOSPITAL_COMMUNITY): Payer: Self-pay

## 2022-03-10 VITALS — BP 166/104 | HR 63 | Ht 73.5 in | Wt 195.0 lb

## 2022-03-10 DIAGNOSIS — G4733 Obstructive sleep apnea (adult) (pediatric): Secondary | ICD-10-CM | POA: Diagnosis not present

## 2022-03-10 DIAGNOSIS — Z79899 Other long term (current) drug therapy: Secondary | ICD-10-CM | POA: Insufficient documentation

## 2022-03-10 DIAGNOSIS — X58XXXA Exposure to other specified factors, initial encounter: Secondary | ICD-10-CM | POA: Insufficient documentation

## 2022-03-10 DIAGNOSIS — Y929 Unspecified place or not applicable: Secondary | ICD-10-CM | POA: Diagnosis not present

## 2022-03-10 DIAGNOSIS — I110 Hypertensive heart disease with heart failure: Secondary | ICD-10-CM | POA: Diagnosis not present

## 2022-03-10 DIAGNOSIS — Z716 Tobacco abuse counseling: Secondary | ICD-10-CM | POA: Insufficient documentation

## 2022-03-10 DIAGNOSIS — F1910 Other psychoactive substance abuse, uncomplicated: Secondary | ICD-10-CM | POA: Diagnosis not present

## 2022-03-10 DIAGNOSIS — I428 Other cardiomyopathies: Secondary | ICD-10-CM | POA: Insufficient documentation

## 2022-03-10 DIAGNOSIS — S83242A Other tear of medial meniscus, current injury, left knee, initial encounter: Secondary | ICD-10-CM | POA: Insufficient documentation

## 2022-03-10 DIAGNOSIS — Y939 Activity, unspecified: Secondary | ICD-10-CM | POA: Insufficient documentation

## 2022-03-10 DIAGNOSIS — I472 Ventricular tachycardia, unspecified: Secondary | ICD-10-CM | POA: Diagnosis not present

## 2022-03-10 DIAGNOSIS — Z91199 Patient's noncompliance with other medical treatment and regimen due to unspecified reason: Secondary | ICD-10-CM | POA: Insufficient documentation

## 2022-03-10 DIAGNOSIS — F17210 Nicotine dependence, cigarettes, uncomplicated: Secondary | ICD-10-CM | POA: Diagnosis not present

## 2022-03-10 DIAGNOSIS — R0609 Other forms of dyspnea: Secondary | ICD-10-CM | POA: Diagnosis present

## 2022-03-10 DIAGNOSIS — I10 Essential (primary) hypertension: Secondary | ICD-10-CM

## 2022-03-10 DIAGNOSIS — I5022 Chronic systolic (congestive) heart failure: Secondary | ICD-10-CM | POA: Insufficient documentation

## 2022-03-10 LAB — BASIC METABOLIC PANEL
Anion gap: 11 (ref 5–15)
BUN: 6 mg/dL (ref 6–20)
CO2: 20 mmol/L — ABNORMAL LOW (ref 22–32)
Calcium: 8.6 mg/dL — ABNORMAL LOW (ref 8.9–10.3)
Chloride: 107 mmol/L (ref 98–111)
Creatinine, Ser: 1.1 mg/dL (ref 0.61–1.24)
GFR, Estimated: >60 mL/min (ref >60)
Glucose, Bld: 94 mg/dL (ref 70–99)
Potassium: 4 mmol/L (ref 3.5–5.1)
Sodium: 138 mmol/L (ref 135–145)

## 2022-03-10 LAB — BRAIN NATRIURETIC PEPTIDE: B Natriuretic Peptide: 881.1 pg/mL — ABNORMAL HIGH (ref 0.0–100.0)

## 2022-03-10 MED ORDER — FUROSEMIDE 20 MG PO TABS: 40.0000 mg | ORAL_TABLET | Freq: Every day | ORAL | 3 refills | Status: DC

## 2022-03-10 MED ORDER — LOSARTAN POTASSIUM 50 MG PO TABS: 50.0000 mg | ORAL_TABLET | Freq: Every day | ORAL | 3 refills | Status: DC

## 2022-03-10 NOTE — Progress Notes (Signed)
ADVANCED HF CLINIC NOTE  Patient ID: Charles Cervantes, male   DOB: [birth year], 61 y.o.   MRN: [id number]  PCP: [name], MD HF Cardiologist: [name]  HPI: Charles Cervantes is a 62 y.o. male with systolic heart failure diagnosed in 04/2012 in [place] secondary to NICM (normal cors on cath), EF 20-25%. As well as polysubstance abuse (cocaine, tobacco and alcohol)  He moved from [facility] and was admitted to [facility] in 4/14 with progressive dyspnea and orthopnea.  Echo on admit EF 20-25% with normal RV.  Discharge weight 147 pounds.  He also had NSVT while in house and we continued his Life[place] placed in [place].  Admitted To [facility] in 8/14 with HF. EF reported 50-55% on echo. On 10/2012 bedside echo in our HF Clinic EF 35-40%. In 11/2012 presented to clinic c/o recurrent CP and severe HF symptoms but seemed well compensated on exam R/L cath showed normal cors.   He was lost to f/u in 2016.   Readmitted to [facility] 9/14-17/21 with severe HTN and recurrent CP anfd HF. Reported he was taking his HF meds except his fluid pill. BP on arrival 163/118. Diuresed and BP controlled.   UDS + for cocaine, THC and opiates.   Echo 09/18/19 EF 20-25% RV mildly HK   Post hospital f/u 9/21 enrolled back in HF Clinic (previously discharged due to inappropriate behavior towards staff).  Echo 7/22 EF had improved to 40-45%, RV normal. Trival Charles. No AS.   Been in two treatment centers in [place] at [facility] 2/9-03/11/20. Then relapsed and went to [facility] 3/22.   Follow up 11/22 mildly volume up, lasix increased to 60 mg daily. BP elevated. He cancelled his follow up.  Last OV in the [facility] was 1/23.   He presents today for surgical clearance prior to anticipated knee surgery for left medial meniscal tear repair. He reports dyspnea on exertion, NYHA Class II-early II and bendopnea. No LEE, orthopnea or PND. Off several of his meds including Losartan, Lasix and Farxiga. Says he did not know he was to continue taking these.  Still taking Coreg and spiro. BP elevated 166/104. Denies CP. EKG shows NSR 61 bpm and nonspecific Twave abnormalities. Still smoking cigarettes denies any other drug use.   ReDs Elevated at 37%   Of note, recent labs from 02/06/22 reviewed. K 4.3 and SCr 1.26    ECHO  04/2012 EF 20-25% 07/17/12 EF 30-35% 08/2012 EF 50-55% at [facility] 02/27/13 EF 35-40% 7/22 EF 40-45%  RHC/LHC  11/20/12  RA = 6  RV = 33/3/8  PA = 29/9 (18)  PCW = 8  Fick cardiac output/index = 6.4/3.2  PVR = 1.5 WU  FA sat = 95%  PA sat = 72%, 73%  SVC sat 72% Extensive calcification in proximal and mid LAD with only mild intraluminal stenosis with 20-30% stenosis.   CPX 2/15 FVC 2.90 (62%)  FEV1 2.22 (60%) FEV1/FVC 76%  Resting HR: 61 Peak HR: 111 (66% age predicted max HR) BP rest: 146/90 BP peak: 168/86 Peak VO2: 22.6 (64.1% predicted peak VO2) VE/VCO2 slope: 29.4 OUES: 2.49 Peak RER: 0.90  Labs 06/29/12 Potassium 5.4 Creatinine 1.05 07/17/12 Dig Level 0.5 Potassium 4.0 Creatinine 0.95 Pro BNP 244 11/20/12 K 4.0 Creatinine 1.18 12/18/12: K 4.7, Cr 0.96 05/16/13 K 4.5 Creatinine 0.91  ROS: All systems negative except as listed in HPI, PMH and Problem List.  Past Medical History:  Diagnosis Date   Anxiety    CHF (congestive heart failure) (  [place])    takes Furosemide daily   Chronic back pain    Constipation    takes Colace daily   COPD (chronic obstructive pulmonary disease) ([place])    Spiriva daily   Depression    takes Effexor daily and Risperdal nightly   Dizziness    H/O blood clots 01/03/2010   in leg   Headache    occasionally   Hyperlipidemia    Hypertension    takes Losartan and Coreg daily   Muscle spasm    takes Zizanidine daily as needed   Shortness of breath dyspnea    more with exertion but sometimes notices with lying/sitting   Sleep apnea    Substance abuse (HCC)    Weakness    tingling and numbness   Current Outpatient Medications  Medication Sig Dispense Refill    acetaminophen (TYLENOL) 500 MG tablet Take 2 tablets (1,000 mg total) by mouth every 6 (six) hours as needed. 30 tablet 0   carvedilol (COREG) 6.25 MG tablet TAKE 1 TABLET(6.25 MG) BY MOUTH TWICE DAILY WITH A MEAL 180 tablet 3   Misc. Devices (GNP DIGITAL WEIGHT SCALE) MISC 1 each by Does not apply route daily. Use to weight yourself daily. 1 each 0   spironolactone (ALDACTONE) 25 MG tablet Take 25 mg by mouth daily.     furosemide (LASIX) 40 MG tablet TAKE 1 AND 1/2 TABLETS(60 MG) BY MOUTH DAILY (Patient not taking: Reported on 03/10/2022) 30 tablet 3   No current facility-administered medications for this encounter.   Social History   Tobacco Use   Smoking status: Every Day    Packs/day: 0.25    Years: 20.00    Total pack years: 5.00    Types: Cigarettes   Smokeless tobacco: Never   Tobacco comments:    1-2 cigs per day (08/15/14)  Vaping Use   Vaping Use: Every day  Substance Use Topics   Alcohol use: No    Alcohol/week: 0.0 standard drinks of alcohol    Comment: stopped in april 2014   Drug use: Not Currently    Types: Cocaine   FHx: - Mom died (45s) in MVA when he was 57 months old - Dad murdered (47yo) when he was in 7th grade  - No FHx of HF  BP (!) 166/104   Pulse 63   Ht 6' 1.5" (1.867 m)   Wt 88.5 kg (195 lb)   SpO2 96%   BMI 25.38 kg/m   Wt Readings from Last 3 Encounters:  03/10/22 88.5 kg (195 lb)  02/07/22 83.7 kg (184 lb 9.6 oz)  07/08/21 86.1 kg (189 lb 12.8 oz)   PHYSICAL EXAM: ReDs 37%  General:  Well appearing. No respiratory difficulty HEENT: normal Neck: supple. JVD 8 cm Carotids 2+ bilat; no bruits. No lymphadenopathy or thyromegaly appreciated. Cor: PMI nondisplaced. Regular rate & rhythm. No rubs, gallops or murmurs. Lungs: clear Abdomen: soft, nontender, nondistended. No hepatosplenomegaly. No bruits or masses. Good bowel sounds. Extremities: no cyanosis, clubbing, rash, edema Neuro: alert & oriented x 3, cranial nerves grossly intact.  moves all 4 extremities w/o difficulty. Affect pleasant.    ASSESSMENT & PLAN:  1) Chronic systolic HF/NICM:  - Echo 02/2013 EF ~35-40% - NYHA II symptoms.  - Cath 11/14 normal cors - Echo 9/21 EF 20-25% RV mildly HK in setting of severe HTN - Echo 7/22 EF 40-45%, RV ok. - Lost to f/u and off several HF meds. BP elevated 166/104. Mild volume overload on exam  w/ Elevated ReDs 37% and NYHA class II-early III symptoms  - Restart Lasix 40 mg daily  - Restart Losartan 50 mg daily (no Entresto due to angiodema with ACEi). - Continue carvedilol 6.25 mg bid.  - Continue Spironolactone 25 mg daily  - BMP/BNP today and again in 7 days - Repeat Echo   2) HTN - Elevated today, in setting of poor compliance. Off several meds outlined above - restart losartan + Lasix, other GDMT per above - BMP today + 7 days   3) Polysubstance abuse - At [facility] abstinent, congratulated on sobriety   4) Current smoker - smokes < 1/2 ppm  - Encouraged cessation.  5) OSA  - noncompliant w/ CPAP, encouraged to improve  6. Left Medical Meniscus Tear  - awaiting surgery, pending cardiac clearance  - he will need updated echo to reassess LV function and r/o significant valvular disease given last echocardiogram was in 2022. Will also need better BP control prior to surgery. He has no h/o CAD. Prior caths w/o disease and no ischemic CP symptoms.   Plan echo and f/u w/ APP in 2 wks to reassess volume status, BP, further titrate meds and complete surgical clearance.   Lyda Jester, PA-C 03/10/22

## 2022-03-10 NOTE — Patient Instructions (Signed)
START Furosemide 40 mg daily START Losartan 50 mg daily  Labs today We will only contact you if something comes back abnormal or we need to make some changes. Otherwise no news is good news!  Labs needed in one week  Your physician has requested that you have an echocardiogram. Echocardiography is a painless test that uses sound waves to create images of your heart. It provides your doctor with information about the size and shape of your heart and how well your heart's chambers and valves are working. This procedure takes approximately one hour. There are no restrictions for this procedure. Please do NOT wear cologne, perfume, aftershave, or lotions (deodorant is allowed). Please arrive 15 minutes prior to your appointment time.  Your physician recommends that you schedule a follow-up appointment in: 2-3 weeks  in the Advanced Practitioners (PA/NP) Clinic    Do the following things EVERYDAY: Weigh yourself in the morning before breakfast. Write it down and keep it in a log. Take your medicines as prescribed Eat low salt foods--Limit salt (sodium) to 2000 mg per day.  Stay as active as you can everyday Limit all fluids for the day to less than 2 liters  At the [facility], you and your health needs are our priority. As part of our continuing mission to provide you with exceptional heart care, we have created designated Provider Care Teams. These Care Teams include your primary Cardiologist (physician) and Advanced Practice Providers (APPs- Physician Assistants and Nurse Practitioners) who all work together to provide you with the care you need, when you need it.   You may see any of the following providers on your designated Care Team at your next follow up: Dr Glori Bickers Dr Loralie Champagne Dr. Roxana Hires, NP Lyda Jester, [place] [facility], [place] Forestine Na, NP Audry Riles, PharmD   Please be sure to bring in all your  medications bottles to every appointment.    Thank you for choosing [facility]   If you have any questions or concerns before your next appointment please send [place] a message through [place] or call our office at [phone].    TO LEAVE A MESSAGE FOR THE NURSE SELECT OPTION 2, PLEASE LEAVE A MESSAGE INCLUDING: YOUR NAME DATE OF BIRTH CALL BACK NUMBER REASON FOR CALL**this is important as we prioritize the call backs  YOU WILL RECEIVE A CALL BACK THE SAME DAY AS LONG AS YOU CALL BEFORE 4:00 PM

## 2022-03-15 ENCOUNTER — Telehealth: Payer: Self-pay | Admitting: *Deleted

## 2022-03-18 ENCOUNTER — Other Ambulatory Visit (HOSPITAL_COMMUNITY): Payer: Medicaid Other

## 2022-03-21 ENCOUNTER — Other Ambulatory Visit (HOSPITAL_COMMUNITY): Payer: Medicaid Other

## 2022-03-28 ENCOUNTER — Other Ambulatory Visit (HOSPITAL_COMMUNITY): Payer: Medicaid Other

## 2022-04-04 ENCOUNTER — Telehealth (HOSPITAL_COMMUNITY): Payer: Self-pay

## 2022-04-04 NOTE — Telephone Encounter (Signed)
Called and left patient a voice message to confirm/remind patient of their appointment at the [facility] on 04/05/22.

## 2022-04-05 ENCOUNTER — Encounter (HOSPITAL_COMMUNITY): Payer: Medicaid Other

## 2022-04-05 ENCOUNTER — Ambulatory Visit (HOSPITAL_COMMUNITY): Admission: RE | Admit: 2022-04-05 | Payer: Medicaid Other | Source: Ambulatory Visit

## 2022-04-22 ENCOUNTER — Other Ambulatory Visit (HOSPITAL_COMMUNITY): Payer: Self-pay

## 2022-04-22 ENCOUNTER — Encounter (HOSPITAL_COMMUNITY): Payer: Self-pay

## 2022-04-22 ENCOUNTER — Ambulatory Visit (HOSPITAL_COMMUNITY)
Admission: RE | Admit: 2022-04-22 | Discharge: 2022-04-22 | Disposition: A | Discharge status: Home / Self Care | Payer: Medicaid Other | Source: Ambulatory Visit | Attending: Internal Medicine | Admitting: Internal Medicine

## 2022-04-22 ENCOUNTER — Other Ambulatory Visit: Payer: Self-pay

## 2022-04-22 VITALS — BP 168/92 | HR 58 | Wt 196.4 lb

## 2022-04-22 DIAGNOSIS — R0609 Other forms of dyspnea: Secondary | ICD-10-CM | POA: Diagnosis not present

## 2022-04-22 DIAGNOSIS — R0600 Dyspnea, unspecified: Secondary | ICD-10-CM | POA: Diagnosis not present

## 2022-04-22 DIAGNOSIS — Z760 Encounter for issue of repeat prescription: Secondary | ICD-10-CM | POA: Diagnosis present

## 2022-04-22 DIAGNOSIS — I5022 Chronic systolic (congestive) heart failure: Secondary | ICD-10-CM

## 2022-04-22 MED ORDER — FUROSEMIDE 40 MG PO TABS
40.0000 mg | ORAL_TABLET | Freq: Every day | ORAL | 3 refills | Status: DC
  Filled 2022-04-22: qty 90, 90d supply, fill #0

## 2022-04-22 MED ORDER — LOSARTAN POTASSIUM 50 MG PO TABS
50.0000 mg | ORAL_TABLET | Freq: Every day | ORAL | 3 refills | Status: DC
  Filled 2022-04-22: qty 90, 90d supply, fill #0

## 2022-04-22 MED ORDER — CARVEDILOL 6.25 MG PO TABS
6.2500 mg | ORAL_TABLET | Freq: Two times a day (BID) | ORAL | 3 refills | Status: DC
  Filled 2022-04-22: qty 180, 90d supply, fill #0
  Filled 2022-11-03: qty 180, 90d supply, fill #1

## 2022-04-22 MED ORDER — SPIRONOLACTONE 25 MG PO TABS
25.0000 mg | ORAL_TABLET | Freq: Every day | ORAL | 3 refills | Status: DC
  Filled 2022-04-22: qty 90, 90d supply, fill #0
  Filled 2022-11-03: qty 90, 90d supply, fill #1

## 2022-04-22 NOTE — Patient Instructions (Addendum)
RESTART ALL MEDS AND TAKE AS DIRECTED:  Carvedilol 6.25 mg Twice daily   Losartan 50 mg Daily  Furosemide 40 mg Daily  Spironolactone 25 mg Daily  Prescriptions have been sent to the [facility], they will mail all meds to you, please answer their phone call to set up billing  Keep appointments as scheduled on Thursday 4/25 at 10 am

## 2022-04-22 NOTE — Progress Notes (Signed)
Pt walked into clinic c/o increased dyspnea. He reports he is unable to lay flat, no sob at rest, min sob with exertion. No CP, slight BLE edema.  Pt reports he has only been taking Carvedilol BID, not taking Losartan or Spironolactone (reports he has been out for over a month), and non-compliant with Lasix (only reports taking twice over past 2 weeks). We discussed med compliance. He reports he gets overwhelmed and PTSD kicks in when he went to pharmacy to get meds and had to leave.  BP elevated, ReDS elevated (47%) discussed diet & med compliance, he is agreeable.   ReDS Vest / Clip - 04/22/22 0900       ReDS Vest / Clip   Station Marker C    Ruler Value 28    ReDS Value Range High volume overload    ReDS Actual Value 47

## 2022-04-22 NOTE — Progress Notes (Signed)
Refills for all cardiac meds sent to [facility] community pharmacy for home delivery, pt is aware and agreeable with this plan. He will take meds as ordered and keep f/u appt as scheduled 4/25

## 2022-04-25 ENCOUNTER — Other Ambulatory Visit: Payer: Self-pay

## 2022-04-27 ENCOUNTER — Other Ambulatory Visit: Payer: Self-pay | Admitting: *Deleted

## 2022-04-27 ENCOUNTER — Telehealth (HOSPITAL_COMMUNITY): Payer: Self-pay

## 2022-04-27 NOTE — Telephone Encounter (Signed)
Called and left patient a voice message to confirm/remind patient of their appointment at the [facility] Clinic on 04/28/22.

## 2022-04-28 ENCOUNTER — Encounter (HOSPITAL_COMMUNITY): Payer: Self-pay

## 2022-04-28 ENCOUNTER — Ambulatory Visit (HOSPITAL_BASED_OUTPATIENT_CLINIC_OR_DEPARTMENT_OTHER)
Admission: RE | Admit: 2022-04-28 | Discharge: 2022-04-28 | Disposition: A | Discharge status: Home / Self Care | Payer: Medicaid Other | Source: Ambulatory Visit | Attending: Cardiology | Admitting: Cardiology

## 2022-04-28 ENCOUNTER — Ambulatory Visit (HOSPITAL_COMMUNITY)
Admission: RE | Admit: 2022-04-28 | Discharge: 2022-04-28 | Disposition: A | Discharge status: Home / Self Care | Payer: Medicaid Other | Source: Ambulatory Visit | Attending: Cardiology | Admitting: Cardiology

## 2022-04-28 VITALS — BP 166/100 | HR 66 | Wt 197.6 lb

## 2022-04-28 DIAGNOSIS — I5022 Chronic systolic (congestive) heart failure: Secondary | ICD-10-CM

## 2022-04-28 DIAGNOSIS — Z91199 Patient's noncompliance with other medical treatment and regimen due to unspecified reason: Secondary | ICD-10-CM | POA: Diagnosis not present

## 2022-04-28 DIAGNOSIS — F1010 Alcohol abuse, uncomplicated: Secondary | ICD-10-CM | POA: Insufficient documentation

## 2022-04-28 DIAGNOSIS — F1410 Cocaine abuse, uncomplicated: Secondary | ICD-10-CM | POA: Diagnosis not present

## 2022-04-28 DIAGNOSIS — G4733 Obstructive sleep apnea (adult) (pediatric): Secondary | ICD-10-CM | POA: Diagnosis not present

## 2022-04-28 DIAGNOSIS — I110 Hypertensive heart disease with heart failure: Secondary | ICD-10-CM | POA: Diagnosis not present

## 2022-04-28 DIAGNOSIS — Z79899 Other long term (current) drug therapy: Secondary | ICD-10-CM | POA: Diagnosis not present

## 2022-04-28 DIAGNOSIS — I428 Other cardiomyopathies: Secondary | ICD-10-CM | POA: Diagnosis not present

## 2022-04-28 DIAGNOSIS — Z720 Tobacco use: Secondary | ICD-10-CM | POA: Diagnosis not present

## 2022-04-28 DIAGNOSIS — F17210 Nicotine dependence, cigarettes, uncomplicated: Secondary | ICD-10-CM | POA: Diagnosis not present

## 2022-04-28 DIAGNOSIS — I429 Cardiomyopathy, unspecified: Secondary | ICD-10-CM | POA: Diagnosis not present

## 2022-04-28 DIAGNOSIS — Z716 Tobacco abuse counseling: Secondary | ICD-10-CM | POA: Insufficient documentation

## 2022-04-28 DIAGNOSIS — I340 Nonrheumatic mitral (valve) insufficiency: Secondary | ICD-10-CM | POA: Insufficient documentation

## 2022-04-28 LAB — BASIC METABOLIC PANEL
Anion gap: 8 (ref 5–15)
BUN: 8 mg/dL (ref 6–20)
CO2: 25 mmol/L (ref 22–32)
Calcium: 8.8 mg/dL — ABNORMAL LOW (ref 8.9–10.3)
Chloride: 105 mmol/L (ref 98–111)
Creatinine, Ser: 1.02 mg/dL (ref 0.61–1.24)
GFR, Estimated: >60 mL/min (ref >60)
Glucose, Bld: 123 mg/dL — ABNORMAL HIGH (ref 70–99)
Potassium: 4 mmol/L (ref 3.5–5.1)
Sodium: 138 mmol/L (ref 135–145)

## 2022-04-28 LAB — BRAIN NATRIURETIC PEPTIDE: B Natriuretic Peptide: 238.4 pg/mL — ABNORMAL HIGH (ref 0.0–100.0)

## 2022-04-28 LAB — ECHOCARDIOGRAM COMPLETE
Area-P 1/2: 2.56 cm2
S' Lateral: 5.2 cm
Single Plane A2C EF: 25.1 %

## 2022-04-28 MED ORDER — LOSARTAN POTASSIUM 50 MG PO TABS: 100.0000 mg | ORAL_TABLET | Freq: Every day | ORAL | 3 refills | Status: DC

## 2022-04-28 NOTE — Progress Notes (Signed)
  Echocardiogram 2D Echocardiogram has been performed.  Charles Cervantes 04/28/2022, 10:43 AM

## 2022-04-28 NOTE — Progress Notes (Addendum)
ADVANCED HF CLINIC NOTE  Patient ID: Charles Cervantes, male   DOB: Dec 17, 1960, 62 y.o.   MRN: [id number]  PCP: Charles Dus, MD HF Cardiologist: Dr. Gala Cervantes  HPI: Charles Cervantes is a 62 y.o. male with systolic heart failure diagnosed in 2014 in [place] secondary to NICM (normal cors on cath), EF 20-25%. Other history includes polysubstance abuse (cocaine, tobacco and alcohol).  He moved from [facility] and was admitted to [facility] in 4/14 with a/c CHF.  Echo: EF 20-25% with normal RV.  Discharge weight 147 pounds.  Admitted To [facility] in 8/14 with HF. EF reported 50-55% on echo. 10/2012 bedside echo in our HF Clinic: EF 35-40%. In 11/2012 presented to clinic c/o recurrent CP and severe HF symptoms but seemed well compensated on exam R/L cath showed normal cors.   He was lost to f/u in 2016.   Readmitted to [facility] 9/14-17/21 with severe HTN and recurrent CP anfd HF. Reported he was taking his HF meds except his fluid pill. BP on arrival 163/118. Diuresed and BP controlled.   Echo 09/18/19 EF 20-25% RV mildly HK   Post hospital f/u 9/21 enrolled back in HF Clinic (previously discharged due to inappropriate behavior towards staff).  Echo 7/22 EF had improved to 40-45%, RV normal. Trival Charles. No AS.   Been in two treatment centers in [place] at [facility] 2/9-03/11/20. Then relapsed and went to [facility] 3/22.   Last OV in the [facility] was 1/23.   He was seen again 03/24 for f/u and surgical clearance prior to anticipated knee surgery for left medial meniscal tear repair.  Off several of his meds including Losartan, Lasix and Farxiga. He was volume overloaded. Meds restarted. Echo ordered.   Seen as urgent nurse visit on 04/19. Reported increased dyspnea. Had not been taking losartan, spiro or lasix. ReDS 47%. Meds restarted.  He is here today for f/u. He is feeling much better after restarting meds. Dyspnea improved. He is able to walk further without dyspnea. No orthopnea, PND or lower extremity edema. Eats out  at [employer] frequently and eats a lot of Ramen Noodles.  Still smokes 5-6 cigarettes a day. No ETOH or cocaine. Drinks 4-5 sodas or lemonade daily.  Lives in an apartment in [place].  ReDs 54%    ECHO  04/2012 EF 20-25% 07/17/12 EF 30-35% 08/2012 EF 50-55% at [facility] 02/27/13 EF 35-40% 7/22 EF 40-45%  RHC/LHC  11/20/12  RA = 6  RV = 33/3/8  PA = 29/9 (18)  PCW = 8  Fick cardiac output/index = 6.4/3.2  PVR = 1.5 WU  FA sat = 95%  PA sat = 72%, 73%  SVC sat 72% Extensive calcification in proximal and mid LAD with only mild intraluminal stenosis with 20-30% stenosis.   CPX 2/15 FVC 2.90 (62%)  FEV1 2.22 (60%) FEV1/FVC 76%  Resting HR: 61 Peak HR: 111 (66% age predicted max HR) BP rest: 146/90 BP peak: 168/86 Peak VO2: 22.6 (64.1% predicted peak VO2) VE/VCO2 slope: 29.4 OUES: 2.49 Peak RER: 0.90  Labs 06/29/12 Potassium 5.4 Creatinine 1.05 07/17/12 Dig Level 0.5 Potassium 4.0 Creatinine 0.95 Pro BNP 244 11/20/12 K 4.0 Creatinine 1.18 12/18/12: K 4.7, Cr 0.96 05/16/13 K 4.5 Creatinine 0.91  ROS: All systems negative except as listed in HPI, PMH and Problem List.  Past Medical History:  Diagnosis Date   Anxiety    CHF (congestive heart failure)    takes Furosemide daily   Chronic back pain  Constipation    takes Colace daily   COPD (chronic obstructive pulmonary disease)    Spiriva daily   Depression    takes Effexor daily and Risperdal nightly   Dizziness    H/O blood clots 01/03/2010   in leg   Headache    occasionally   Hyperlipidemia    Hypertension    takes Losartan and Coreg daily   Muscle spasm    takes Zizanidine daily as needed   Shortness of breath dyspnea    more with exertion but sometimes notices with lying/sitting   Sleep apnea    Substance abuse    Weakness    tingling and numbness   Current Outpatient Medications  Medication Sig Dispense Refill   acetaminophen (TYLENOL) 500 MG tablet Take 2 tablets (1,000 mg total) by mouth  every 6 (six) hours as needed. 30 tablet 0   carvedilol (COREG) 6.25 MG tablet Take 1 tablet (6.25 mg total) by mouth 2 (two) times daily with a meal. 180 tablet 3   furosemide (LASIX) 40 MG tablet Take 1 tablet (40 mg total) by mouth daily. 90 tablet 3   losartan (COZAAR) 50 MG tablet Take 1 tablet (50 mg total) by mouth daily. 90 tablet 3   spironolactone (ALDACTONE) 25 MG tablet Take 1 tablet (25 mg total) by mouth daily. 90 tablet 3   No current facility-administered medications for this visit.   Social History   Tobacco Use   Smoking status: Every Day    Packs/day: 0.25    Years: 20.00    Additional pack years: 0.00    Total pack years: 5.00    Types: Cigarettes   Smokeless tobacco: Never   Tobacco comments:    1-2 cigs per day (08/15/14)  Vaping Use   Vaping Use: Every day  Substance Use Topics   Alcohol use: No    Alcohol/week: 0.0 standard drinks of alcohol    Comment: stopped in april 2014   Drug use: Not Currently    Types: Cocaine   FHx: - Mom died (30s) in MVA when he was 79 months old - Dad murdered (65yo) when he was in 7th grade  - No FHx of HF  There were no vitals taken for this visit.  Wt Readings from Last 3 Encounters:  04/22/22 89.1 kg (196 lb 6 oz)  03/10/22 88.5 kg (195 lb)  02/07/22 83.7 kg (184 lb 9.6 oz)   PHYSICAL EXAM: ReDs 54% General:  Well appearing. Ambulated into clinic HEENT: normal Neck: supple. no JVD. Carotids 2+ bilat; no bruits.  Cor: PMI nondisplaced. Regular rate & rhythm. No rubs, gallops or murmurs. Lungs: clear Abdomen: soft, nontender, nondistended.  Extremities: no cyanosis, clubbing, rash, edema Neuro: alert & orientedx3. Affect pleasant   ASSESSMENT & PLAN:  1) Chronic systolic HF/NICM:  - Echo 02/2013 EF ~35-40% - NYHA II symptoms.  - Cath 11/14 normal cors - Echo 9/21 EF 20-25% RV mildly HK in setting of severe HTN - Echo 7/22 EF 40-45%, RV ok. - Recently lost to follow-up and stopped several meds. NYHA class  II. Volume okay today on exam. However, ReDS 54%. This appears discordant with physical exam and symptoms. Will check BNP, if significantly elevated may consider increasing diuretics for several days.  - Increase Losartan to 100 mg daily (no Entresto due to angiodema with ACEi). - Continue carvedilol 6.25 mg bid.  - Continue Spironolactone 25 mg daily  - May need hydralazine/imdur next - Needs to cut back fluid  and sodium intake - He was gifted a scale today - BMP/BNP today and again in 7 days - Repeat Echo pending  2) HTN - BP uncontrolled - Increasing Losartan as above  3) Polysubstance abuse - At [facility]. - Remains abstinent, congratulated on sobriety   4) Current smoker - smokes < 1/2 ppm  - Encouraged cessation. Does not appear ready to quit  5) OSA  - noncompliant w/ CPAP, he lost the machine in a move - Referred for another sleep study - may be contributing to hypertension  6. Left Medical Meniscus Tear  - awaiting surgery, pending cardiac clearance. He is now undecided if he wants to proceed with surgery - Will need better BP control if he decided to proceed with surgery. He has no h/o CAD. Prior caths w/o disease and no ischemic CP symptoms. He is able to achieve 4 METS. Repeat echo pending, last exam in 2022.  Follow-up: BMET 2 weeks, APP 4 weeks  Michaeleen Down N, PA-C 04/28/22

## 2022-04-28 NOTE — Patient Instructions (Signed)
Thank you for coming in today  Labs were done today, if any labs are abnormal the clinic will call you No news is good news  Medications: Increase Losartan to 100 mg 2 tablets daily  Follow up appointments:  Your physician recommends that you return for lab work in: 2 weeks for BMET  Your physician recommends that you schedule a follow-up appointment in:  4 weeks in clinic    Do the following things EVERYDAY: Weigh yourself in the morning before breakfast. Write it down and keep it in a log. Take your medicines as prescribed Eat low salt foods--Limit salt (sodium) to 2000 mg per day.  Stay as active as you can everyday Limit all fluids for the day to less than 2 liters   At the [facility], you and your health needs are our priority. As part of our continuing mission to provide you with exceptional heart care, we have created designated Provider Care Teams. These Care Teams include your primary Cardiologist (physician) and Advanced Practice Providers (APPs- Physician Assistants and Nurse Practitioners) who all work together to provide you with the care you need, when you need it.   You may see any of the following providers on your designated Care Team at your next follow up: Dr Arvilla Meres Dr Marca Ancona Dr. Marcos Eke, NP Robbie Lis, [place] [facility] [place], [place] Brynda Peon, NP Karle Plumber, PharmD   Please be sure to bring in all your medications bottles to every appointment.    Thank you for choosing [facility]  If you have any questions or concerns before your next appointment please send [place] a message through [place] or call our office at [phone].    TO LEAVE A MESSAGE FOR THE NURSE SELECT OPTION 2, PLEASE LEAVE A MESSAGE INCLUDING: YOUR NAME DATE OF BIRTH CALL BACK NUMBER REASON FOR CALL**this is important as we prioritize the call backs  YOU WILL RECEIVE A  CALL BACK THE SAME DAY AS LONG AS YOU CALL BEFORE 4:00 PM

## 2022-04-28 NOTE — Progress Notes (Addendum)
ReDS Vest / Clip - 04/28/22 1100       ReDS Vest / Clip   Station Marker C    Ruler Value 21    ReDS Value Range Low volume    ReDS Actual Value 54

## 2022-05-12 ENCOUNTER — Ambulatory Visit (HOSPITAL_COMMUNITY)
Admission: RE | Admit: 2022-05-12 | Discharge: 2022-05-12 | Disposition: A | Discharge status: Home / Self Care | Payer: Medicaid Other | Source: Ambulatory Visit | Attending: Internal Medicine | Admitting: Internal Medicine

## 2022-05-12 DIAGNOSIS — I5022 Chronic systolic (congestive) heart failure: Secondary | ICD-10-CM | POA: Diagnosis present

## 2022-05-12 LAB — BASIC METABOLIC PANEL
Anion gap: 8 (ref 5–15)
BUN: 9 mg/dL (ref 6–20)
CO2: 24 mmol/L (ref 22–32)
Calcium: 8.5 mg/dL — ABNORMAL LOW (ref 8.9–10.3)
Chloride: 104 mmol/L (ref 98–111)
Creatinine, Ser: 1.09 mg/dL (ref 0.61–1.24)
GFR, Estimated: >60 mL/min (ref >60)
Glucose, Bld: 124 mg/dL — ABNORMAL HIGH (ref 70–99)
Potassium: 3.7 mmol/L (ref 3.5–5.1)
Sodium: 136 mmol/L (ref 135–145)

## 2022-05-23 ENCOUNTER — Encounter (HOSPITAL_COMMUNITY): Payer: Medicaid Other

## 2022-06-07 ENCOUNTER — Encounter (HOSPITAL_COMMUNITY): Payer: Medicaid Other

## 2022-06-13 ENCOUNTER — Telehealth (HOSPITAL_COMMUNITY): Payer: Self-pay

## 2022-06-13 NOTE — Telephone Encounter (Signed)
Called to confirm/remind patient of their appointment at the [facility] on 06/14/22.   Patient reminded to bring all medications and/or complete list.  Confirmed patient has transportation. Gave directions, instructed to utilize valet parking.  Confirmed appointment prior to ending call.

## 2022-06-14 ENCOUNTER — Other Ambulatory Visit: Payer: Self-pay

## 2022-06-14 ENCOUNTER — Other Ambulatory Visit (HOSPITAL_COMMUNITY): Payer: Self-pay

## 2022-06-14 ENCOUNTER — Ambulatory Visit (HOSPITAL_COMMUNITY)
Admission: RE | Admit: 2022-06-14 | Discharge: 2022-06-14 | Disposition: A | Discharge status: Home / Self Care | Payer: Medicaid Other | Source: Ambulatory Visit | Attending: Family Medicine | Admitting: Family Medicine

## 2022-06-14 ENCOUNTER — Encounter (HOSPITAL_COMMUNITY): Payer: Self-pay

## 2022-06-14 VITALS — BP 162/98 | HR 55 | Ht 73.5 in | Wt 194.0 lb

## 2022-06-14 DIAGNOSIS — I5022 Chronic systolic (congestive) heart failure: Secondary | ICD-10-CM | POA: Insufficient documentation

## 2022-06-14 DIAGNOSIS — G4719 Other hypersomnia: Secondary | ICD-10-CM | POA: Diagnosis not present

## 2022-06-14 DIAGNOSIS — Z91199 Patient's noncompliance with other medical treatment and regimen due to unspecified reason: Secondary | ICD-10-CM | POA: Diagnosis not present

## 2022-06-14 DIAGNOSIS — I428 Other cardiomyopathies: Secondary | ICD-10-CM | POA: Insufficient documentation

## 2022-06-14 DIAGNOSIS — G4733 Obstructive sleep apnea (adult) (pediatric): Secondary | ICD-10-CM | POA: Insufficient documentation

## 2022-06-14 DIAGNOSIS — F1411 Cocaine abuse, in remission: Secondary | ICD-10-CM | POA: Diagnosis not present

## 2022-06-14 DIAGNOSIS — S83242D Other tear of medial meniscus, current injury, left knee, subsequent encounter: Secondary | ICD-10-CM | POA: Insufficient documentation

## 2022-06-14 DIAGNOSIS — Z720 Tobacco use: Secondary | ICD-10-CM

## 2022-06-14 DIAGNOSIS — I10 Essential (primary) hypertension: Secondary | ICD-10-CM

## 2022-06-14 DIAGNOSIS — I110 Hypertensive heart disease with heart failure: Secondary | ICD-10-CM | POA: Insufficient documentation

## 2022-06-14 DIAGNOSIS — F17210 Nicotine dependence, cigarettes, uncomplicated: Secondary | ICD-10-CM | POA: Diagnosis not present

## 2022-06-14 DIAGNOSIS — Z79899 Other long term (current) drug therapy: Secondary | ICD-10-CM | POA: Insufficient documentation

## 2022-06-14 DIAGNOSIS — S83242S Other tear of medial meniscus, current injury, left knee, sequela: Secondary | ICD-10-CM

## 2022-06-14 DIAGNOSIS — F1910 Other psychoactive substance abuse, uncomplicated: Secondary | ICD-10-CM

## 2022-06-14 LAB — BASIC METABOLIC PANEL
Anion gap: 6 (ref 5–15)
BUN: 8 mg/dL (ref 8–23)
CO2: 23 mmol/L (ref 22–32)
Calcium: 8.6 mg/dL — ABNORMAL LOW (ref 8.9–10.3)
Chloride: 107 mmol/L (ref 98–111)
Creatinine, Ser: 1.13 mg/dL (ref 0.61–1.24)
GFR, Estimated: >60 mL/min (ref >60)
Glucose, Bld: 114 mg/dL — ABNORMAL HIGH (ref 70–99)
Potassium: 3.7 mmol/L (ref 3.5–5.1)
Sodium: 136 mmol/L (ref 135–145)

## 2022-06-14 LAB — BRAIN NATRIURETIC PEPTIDE: B Natriuretic Peptide: 236 pg/mL — ABNORMAL HIGH (ref 0.0–100.0)

## 2022-06-14 MED ORDER — FUROSEMIDE 20 MG PO TABS
20.0000 mg | ORAL_TABLET | Freq: Every day | ORAL | 6 refills | Status: DC
  Filled 2022-06-14: qty 30, 30d supply, fill #0
  Filled 2022-11-03: qty 30, 30d supply, fill #1

## 2022-06-14 MED ORDER — ISOSORB DINITRATE-HYDRALAZINE 20-37.5 MG PO TABS
0.5000 | ORAL_TABLET | Freq: Three times a day (TID) | ORAL | 3 refills | Status: DC
  Filled 2022-06-14: qty 45, 30d supply, fill #0

## 2022-06-14 MED ORDER — DAPAGLIFLOZIN PROPANEDIOL 10 MG PO TABS
10.0000 mg | ORAL_TABLET | Freq: Every day | ORAL | 6 refills | Status: DC
  Filled 2022-06-14 – 2022-11-03 (×2): qty 30, 30d supply, fill #0

## 2022-06-14 NOTE — Progress Notes (Signed)
REDS VEST READING= 35% CHEST RULER=31 HEIGHT MARKER=C

## 2022-06-14 NOTE — Patient Instructions (Signed)
Medication Changes:  Start Farxiga 10 mg Daily  START Bidil 1/2 tab Three times a day  Decrease Furosemide to 20 mg Daily  Lab Work:  Labs done today, we will call you for abnormal results  Testing/Procedures:  Your physician has recommended that you have a sleep study. This test records several body functions during sleep, including: brain activity, eye movement, oxygen and carbon dioxide blood levels, heart rate and rhythm, breathing rate and rhythm, the flow of air through your mouth and nose, snoring, body muscle movements, and chest and belly movement. ONCE APPROVED BY INSURANCE YOU WILL BE CALLED TO SCHEDULE  Your physician has requested that you have an echocardiogram. Echocardiography is a painless test that uses sound waves to create images of your heart. It provides your doctor with information about the size and shape of your heart and how well your heart's chambers and valves are working. This procedure takes approximately one hour. There are no restrictions for this procedure. Please do NOT wear cologne, perfume, aftershave, or lotions (deodorant is allowed). Please arrive 15 minutes prior to your appointment time. IN 3 MONTHS  Referrals:  none  Special Instructions // Education:  Do the following things EVERYDAY: Weigh yourself in the morning before breakfast. Write it down and keep it in a log. Take your medicines as prescribed Eat low salt foods--Limit salt (sodium) to 2000 mg per day.  Stay as active as you can everyday Limit all fluids for the day to less than 2 liters  Your physician has requested that you regularly monitor and record your blood pressure readings at home. Please use the same machine at the same time of day to check your readings and record them to bring to your follow-up visit. **IF SBP >140 PLEASE LET us KNOW  Follow-Up in: 3 months with an echocardiogram   At the [facility], you and your health needs are our priority. We  have a designated team specialized in the treatment of Heart Failure. This Care Team includes your primary Heart Failure Specialized Cardiologist (physician), Advanced Practice Providers (APPs- Physician Assistants and Nurse Practitioners), and Pharmacist who all work together to provide you with the care you need, when you need it.   You may see any of the following providers on your designated Care Team at your next follow up:  Dr. Arvilla Meres Dr. Marca Ancona Dr. Marcos Eke, NP Robbie Lis, [place] [facility] [place], [place] Brynda Peon, NP Karle Plumber, PharmD   Please be sure to bring in all your medications bottles to every appointment.   Need to Contact us:  If you have any questions or concerns before your next appointment please send [place] a message through [place] or call our office at [phone].    TO LEAVE A MESSAGE FOR THE NURSE SELECT OPTION 2, PLEASE LEAVE A MESSAGE INCLUDING: YOUR NAME DATE OF BIRTH CALL BACK NUMBER REASON FOR CALL**this is important as we prioritize the call backs  YOU WILL RECEIVE A CALL BACK THE SAME DAY AS LONG AS YOU CALL BEFORE 4:00 PM

## 2022-06-14 NOTE — Progress Notes (Signed)
ADVANCED HF CLINIC NOTE  Patient ID: Charles Cervantes, male   DOB: 1960-03-03, 62 y.o.   MRN: [id number]  PCP: Charles Dus, MD HF Cardiologist: Charles Cervantes  HPI: Charles Cervantes is a 62 y.o. male with systolic heart failure diagnosed in 2014 in [place] secondary to NICM (normal cors on cath), EF 20-25%. Other history includes polysubstance abuse (cocaine, tobacco and alcohol).  He moved from [facility] and was admitted to [facility] in 4/14 with a/c CHF.  Echo showed EF 20-25% with normal RV.  Discharge weight 147 pounds.  Admitted To [facility] in 8/14 with HF. EF reported 50-55% on echo. 10/2012 bedside echo in our HF Clinic: EF 35-40%. In 11/2012 presented to clinic c/o recurrent CP and severe HF symptoms but seemed well compensated on exam R/L cath showed normal cors.   He was lost to f/u in 2016.   Readmitted to [facility] 9/14-17/21 with severe HTN and recurrent CP anfd HF. Reported he was taking his HF meds except his fluid pill. BP on arrival 163/118. Diuresed and BP controlled.   Echo 09/18/19 EF 20-25% RV mildly HK   Post hospital f/u 9/21 enrolled back in HF Clinic (previously discharged due to inappropriate behavior towards staff).  Echo 7/22 EF had improved to 40-45%, RV normal. Trival Charles. No AS.   Been in two treatment centers in [place] at [facility] 2/9-03/11/20. Then relapsed and went to [facility] 3/22.   Follow up 3/24 for surgical clearance prior to anticipated knee surgery for left medial meniscal tear repair.  Off several of his meds including Losartan, Lasix and Farxiga. He was volume overloaded. Meds restarted. Echo ordered.   Seen as urgent nurse visit on 04/22/22. Reported increased dyspnea. Had not been taking losartan, spiro or lasix. ReDS 47%. Meds restarted.   Echo (4/24) showed EF 30-35%, mild LVH, RV ok  Today he returns for HF follow up. Overall feeling fine. He has no SOB walking up steps. Denies palpitations, CP, dizziness, edema, or PND/Orthopnea. Appetite ok. No fever or chills.  Weight at home 189-195 pounds. Taking all medications. Last used cocaine 2 years ago. Smokes 1/2 ppd, drinks beer on weekends. Planning for left meniscal tear repair at some point with Dr. August Cervantes. He does not have significant knee pain.  Cardiac Studies - Echo (4/24): EF 30-35%   - Echo (7/22): EF 40-45%  - Echo (9/21): EF 20-25% RV mildly HK in setting of severe HTN  - Echo (2/15): EF 35-40%  - CPX (2/15) FVC 2.90 (62%)  FEV1 2.22 (60%) FEV1/FVC 76%  Resting HR: 61 Peak HR: 111 (66% age predicted max HR) BP rest: 146/90 BP peak: 168/86 Peak VO2: 22.6 (64.1% predicted peak VO2) VE/VCO2 slope: 29.4 OUES: 2.49 Peak RER: 0.90  - RHC/LHC  (11/14) RA = 6  RV = 33/3/8  PA = 29/9 (18)  PCW = 8  Fick cardiac output/index = 6.4/3.2  PVR = 1.5 WU  FA sat = 95%  PA sat = 72%, 73%  SVC sat 72% Extensive calcification in proximal and mid LAD with only mild intraluminal stenosis with 20-30% stenosis.   - Echo (8/14): EF 50-55% at [facility]  - Echo (7/14): EF 30-35%  - Echo (4/14): EF 20-25%  ROS: All systems negative except as listed in HPI, PMH and Problem List.  Past Medical History:  Diagnosis Date   Anxiety    CHF (congestive heart failure) (HCC)    takes Furosemide daily   Chronic back pain    Constipation  takes Colace daily   COPD (chronic obstructive pulmonary disease) (HCC)    Spiriva daily   Depression    takes Effexor daily and Risperdal nightly   Dizziness    H/O blood clots 01/03/2010   in leg   Headache    occasionally   Hyperlipidemia    Hypertension    takes Losartan and Coreg daily   Muscle spasm    takes Zizanidine daily as needed   Shortness of breath dyspnea    more with exertion but sometimes notices with lying/sitting   Sleep apnea    Substance abuse (HCC)    Weakness    tingling and numbness   Current Outpatient Medications  Medication Sig Dispense Refill   acetaminophen (TYLENOL) 500 MG tablet Take 2 tablets (1,000 mg total) by mouth  every 6 (six) hours as needed. 30 tablet 0   carvedilol (COREG) 6.25 MG tablet Take 1 tablet (6.25 mg total) by mouth 2 (two) times daily with a meal. 180 tablet 3   furosemide (LASIX) 40 MG tablet Take 1 tablet (40 mg total) by mouth daily. 90 tablet 3   losartan (COZAAR) 50 MG tablet Take 2 tablets (100 mg total) by mouth daily. 180 tablet 3   spironolactone (ALDACTONE) 25 MG tablet Take 1 tablet (25 mg total) by mouth daily. 90 tablet 3   No current facility-administered medications for this encounter.   Social History   Tobacco Use   Smoking status: Every Day    Packs/day: 0.25    Years: 20.00    Additional pack years: 0.00    Total pack years: 5.00    Types: Cigarettes   Smokeless tobacco: Never   Tobacco comments:    1-2 cigs per day (08/15/14)  Vaping Use   Vaping Use: Every day  Substance Use Topics   Alcohol use: No    Alcohol/week: 0.0 standard drinks of alcohol    Comment: stopped in april 2014   Drug use: Not Currently    Types: Cocaine   FHx: - Mom died (30s) in MVA when he was 66 months old - Dad murdered (65yo) when he was in 7th grade  - No FHx of HF  BP (!) 162/98   Pulse (!) 55   Ht 6' 1.5" (1.867 m)   Wt 88 kg (194 lb)   SpO2 97%   BMI 25.25 kg/m   Wt Readings from Last 3 Encounters:  06/14/22 88 kg (194 lb)  04/28/22 89.6 kg (197 lb 9.6 oz)  04/22/22 89.1 kg (196 lb 6 oz)   PHYSICAL EXAM: General:  NAD. No resp difficulty, walked into clinic HEENT: Normal Neck: Supple. No JVD. Carotids 2+ bilat; no bruits. No lymphadenopathy or thryomegaly appreciated. Cor: PMI nondisplaced. Regular rate & rhythm. No rubs, gallops or murmurs. Lungs: Clear Abdomen: Soft, nontender, nondistended. No hepatosplenomegaly. No bruits or masses. Good bowel sounds. Extremities: No cyanosis, clubbing, rash, edema Neuro: Alert & oriented x 3, cranial nerves grossly intact. Moves all 4 extremities w/o difficulty. Affect pleasant.  ReDs: 35%  ASSESSMENT & PLAN: 1.  Chronic systolic HF/NICM:  - Cath 11/14 normal cors - Echo (2/15): EF ~35-40%  - Echo (9/21): EF 20-25% RV mildly HK in setting of severe HTN - Echo (7/22): EF 40-45%, RV ok. - Lost to follow-up and stopped several meds. - Echo (4/24): EF 30-35%  - NYHA I-II, volume ok, REDs 35% - Start Farxiga 10 mg daily. - Start BiDil 0.5 tab tid - Decrease Lasix to 20  mg daily, may be able to stop next visit. - Continue losartan to 100 mg daily (no Entresto due to angiodema with ACEi). - Continue carvedilol 6.25 mg bid. Will not increase with HR 55  - Continue spironolactone 25 mg daily  - Labs today. - Repeat echo in 3 months now that he is back on GDMT  2. HTN - BP uncontrolled - Add BiDil as above - Given Rx for BP cuff for home checks - Notify clinic if SBP >140  3. Polysubstance abuse - At [facility]. - No cocaine use in years, drinks occasional beer. - Remains abstinent, congratulated on sobriety   4. Current smoker - Smokes 1/2 ppd  - Encouraged cessation. Does not appear ready to quit  5. OSA  - Noncompliant w/ CPAP, he lost the machine in a move - Referred for another sleep study - May be contributing to hypertension  6. Left Medical Meniscus Tear  - awaiting surgery, pending cardiac clearance. He is now undecided if he wants to proceed with surgery - Will need better BP control if he decided to proceed with surgery. He has no h/o CAD. Prior caths w/o disease and no ischemic CP symptoms. He is able to achieve 4 METS. Repeat echo with mildly lower EF. Ideally would await repeat echo after being back on GDMT until undergoing elective surgery.  Follow up in 3 months with Charles Cervantes + echo  [place], [facility]  06/14/22

## 2022-06-15 ENCOUNTER — Other Ambulatory Visit (HOSPITAL_COMMUNITY): Payer: Self-pay

## 2022-06-16 ENCOUNTER — Other Ambulatory Visit (HOSPITAL_COMMUNITY): Payer: Self-pay

## 2022-06-16 ENCOUNTER — Other Ambulatory Visit: Payer: Self-pay

## 2022-07-18 ENCOUNTER — Other Ambulatory Visit (HOSPITAL_COMMUNITY): Payer: Self-pay

## 2022-07-19 ENCOUNTER — Other Ambulatory Visit (HOSPITAL_COMMUNITY): Payer: Self-pay

## 2022-09-15 ENCOUNTER — Ambulatory Visit (HOSPITAL_COMMUNITY): Payer: Medicaid Other

## 2022-09-15 ENCOUNTER — Encounter (HOSPITAL_COMMUNITY): Payer: Medicaid Other | Admitting: Internal Medicine

## 2022-11-03 ENCOUNTER — Other Ambulatory Visit: Payer: Self-pay

## 2022-11-03 ENCOUNTER — Other Ambulatory Visit (HOSPITAL_COMMUNITY): Payer: Self-pay | Admitting: Internal Medicine

## 2022-11-03 ENCOUNTER — Other Ambulatory Visit (HOSPITAL_COMMUNITY): Payer: Self-pay

## 2022-11-03 ENCOUNTER — Telehealth (HOSPITAL_COMMUNITY): Payer: Self-pay | Admitting: *Deleted

## 2022-11-03 ENCOUNTER — Ambulatory Visit (HOSPITAL_COMMUNITY)
Admission: EM | Admit: 2022-11-03 | Discharge: 2022-11-03 | Disposition: A | Discharge status: Home / Self Care | Payer: Medicaid Other | Attending: Family Medicine | Admitting: Family Medicine

## 2022-11-03 ENCOUNTER — Encounter (HOSPITAL_COMMUNITY): Payer: Self-pay

## 2022-11-03 DIAGNOSIS — M62830 Muscle spasm of back: Secondary | ICD-10-CM

## 2022-11-03 DIAGNOSIS — T148XXA Other injury of unspecified body region, initial encounter: Secondary | ICD-10-CM

## 2022-11-03 MED ORDER — LIDOCAINE HCL (PF) 1 % IJ SOLN
INTRAMUSCULAR | Status: AC
  Filled 2022-11-03: qty 30

## 2022-11-03 MED ORDER — KETOROLAC TROMETHAMINE 60 MG/2ML IM SOLN
INTRAMUSCULAR | Status: AC
  Filled 2022-11-03: qty 2

## 2022-11-03 MED ORDER — LIDOCAINE HCL (PF) 1 % IJ SOLN
30.0000 mL | Freq: Once | INTRAMUSCULAR | Status: AC
  Administered 2022-11-03: 30 mL

## 2022-11-03 MED ORDER — LOSARTAN POTASSIUM 100 MG PO TABS
100.0000 mg | ORAL_TABLET | Freq: Every day | ORAL | 3 refills | Status: DC
  Filled 2022-11-03: qty 90, 90d supply, fill #0

## 2022-11-03 MED ORDER — KETOROLAC TROMETHAMINE 60 MG/2ML IM SOLN
60.0000 mg | Freq: Once | INTRAMUSCULAR | Status: AC
  Administered 2022-11-03: 60 mg via INTRAMUSCULAR

## 2022-11-03 MED ORDER — CYCLOBENZAPRINE HCL 10 MG PO TABS: 10.0000 mg | ORAL_TABLET | Freq: Two times a day (BID) | ORAL | 0 refills | Status: AC | PRN

## 2022-11-03 NOTE — Discharge Instructions (Addendum)
Continue to use lidocaine patches and heat over the affected area Continue use of Tylenol for pain 3 times a day You can take a muscle relaxer as needed as prescribed.  Take caution with this as it may make you sleepy and do not drive a vehicle after taking. Continue gentle range of motion over the next several days but avoid lifting anything heavy.  Today you received an injection with Lidocaine. This injection is usually done in response to pain and there is some "numbing medicine" (Lidocaine) in the shot, so the injected area may be numb and feel really good for the next couple of hours. The numbing medicine usually wears off in 2-3 hours, and then your pain level may be back to where it was before the injection.    You may actually experience a small (as in 10%) INCREASE in pressure sensation in the first 24 hours---that is common.  Things to watch out for that you should contact us or a health care provider urgently would include: 1. Unusual (as in more than 10%) increase in pain 2. New fever > 101.5 3. New swelling or redness of the injected area. 4. Streaking of red lines around the area injected.  Do not hesitate to call or reach out with any questions or concerns.

## 2022-11-03 NOTE — ED Provider Notes (Signed)
[facility]    CSN: [id number] Arrival date & time: 11/03/22  2440      History   Chief Complaint Chief Complaint  Patient presents with   Back Pain    HPI Charles Cervantes is a 62 y.o. male.   [name] reports left-sided thoracic back pain for 1 week after lifting a dresser and twisting with a friend to loaded into a truck.  Since then he has had muscle spasms, decreased range of motion, and aching pain that travels to the left ribs.  The pain is currently 7/10.  He has not had any relief from Aleve or Advil.  He did get some relief from lidocaine patches and Tiger balm.  He has not had any numbness or weakness distally, loss of bowel or bladder.  He did not take his blood pressure medication today or yesterday.  He is not having any chest pain, vision changes, headache, dizziness, difficulty with speech, or shortness of breath.  He plans on taking his blood pressure medication as soon as he gets home.  The history is provided by the patient.  Back Pain Associated symptoms: no chest pain, no fever, no headaches and no numbness     Past Medical History:  Diagnosis Date   Anxiety    CHF (congestive heart failure) (HCC)    takes Furosemide daily   Chronic back pain    Constipation    takes Colace daily   COPD (chronic obstructive pulmonary disease) (HCC)    Spiriva daily   Depression    takes Effexor daily and Risperdal nightly   Dizziness    H/O blood clots 01/03/2010   in leg   Headache    occasionally   Hyperlipidemia    Hypertension    takes Losartan and Coreg daily   Muscle spasm    takes Zizanidine daily as needed   Shortness of breath dyspnea    more with exertion but sometimes notices with lying/sitting   Sleep apnea    Substance abuse (HCC)    Weakness    tingling and numbness    Patient Active Problem List   Diagnosis Date Noted   Pain in left knee 04/26/2021   Cubital tunnel syndrome on right 08/25/2020   Sleep concern 08/25/2020   Foot  pain, left 06/16/2020   Impingement syndrome of left shoulder 06/16/2020   Encounter for hepatitis C virus screening test for high risk patient 06/16/2020   NICM (nonischemic cardiomyopathy) (HCC) 12/04/2015   Lumbar back pain 09/03/2014   Spinal stenosis in cervical region 04/21/2014   PTSD (post-traumatic stress disorder) 11/04/2013   HTN (hypertension) 01/01/2013   Tobacco use disorder 09/05/2012   Chronic systolic heart failure (HCC) 05/08/2012   Alcohol abuse 05/01/2012   Cocaine abuse (HCC) 05/01/2012    Past Surgical History:  Procedure Laterality Date   LEFT AND RIGHT HEART CATHETERIZATION WITH CORONARY ANGIOGRAM N/A 11/20/2012   Procedure: LEFT AND RIGHT HEART CATHETERIZATION WITH CORONARY ANGIOGRAM;  Surgeon: Dolores Patty, MD;  Location: [facility] CATH LAB;  Service: Cardiovascular;  Laterality: N/A;   Left Second finger surgery     LUMBAR FUSION  09/05/2014   L5 S1       Home Medications    Prior to Admission medications   Medication Sig Start Date End Date Taking? Authorizing Provider  cyclobenzaprine (FLEXERIL) 10 MG tablet Take 1 tablet (10 mg total) by mouth 2 (two) times daily as needed for up to 3 days for muscle spasms (  Take if Tylenol, lidocain patches, and heat does not relieve pain). 11/03/22 11/06/22 Yes Ivor Messier, MD  acetaminophen (TYLENOL) 500 MG tablet Take 2 tablets (1,000 mg total) by mouth every 6 (six) hours as needed. 09/28/20   Cathleen Corti, MD  carvedilol (COREG) 6.25 MG tablet Take 1 tablet (6.25 mg total) by mouth 2 (two) times daily with a meal. 04/22/22   Bensimhon, Bevelyn Buckles, MD  dapagliflozin propanediol (FARXIGA) 10 MG TABS tablet Take 1 tablet (10 mg total) by mouth daily before breakfast. 06/14/22   Milford, Anderson Malta, FNP  furosemide (LASIX) 20 MG tablet Take 1 tablet (20 mg total) by mouth daily. 06/14/22   Milford, Anderson Malta, FNP  losartan (COZAAR) 100 MG tablet Take 1 tablet (100 mg total) by mouth daily. 11/03/22    Bensimhon, Bevelyn Buckles, MD  spironolactone (ALDACTONE) 25 MG tablet Take 1 tablet (25 mg total) by mouth daily. 04/22/22   Bensimhon, Bevelyn Buckles, MD    Family History Family History  Problem Relation Age of Onset   Prostate cancer Paternal Uncle    Colon cancer Neg Hx    Colon polyps Neg Hx    Esophageal cancer Neg Hx    Rectal cancer Neg Hx    Stomach cancer Neg Hx     Social History Social History   Tobacco Use   Smoking status: Every Day    Current packs/day: 0.25    Average packs/day: 0.3 packs/day for 20.0 years (5.0 ttl pk-yrs)    Types: Cigarettes   Smokeless tobacco: Never   Tobacco comments:    1-2 cigs per day (08/15/14)  Vaping Use   Vaping status: Every Day  Substance Use Topics   Alcohol use: No    Alcohol/week: 0.0 standard drinks of alcohol    Comment: stopped in april 2014   Drug use: Not Currently    Types: Cocaine     Allergies   Ace inhibitors   Review of Systems Review of Systems  Constitutional:  Positive for activity change. Negative for chills and fever.  Respiratory:  Negative for cough, chest tightness and shortness of breath.   Cardiovascular:  Negative for chest pain, palpitations and leg swelling.  Musculoskeletal:  Positive for back pain.  Skin:  Negative for color change.  Neurological:  Negative for dizziness, light-headedness, numbness and headaches.  Psychiatric/Behavioral:  Negative for confusion.      Physical Exam Triage Vital Signs ED Triage Vitals  Encounter Vitals Group     BP 11/03/22 0830 (!) 194/119     Systolic BP Percentile --      Diastolic BP Percentile --      Pulse Rate 11/03/22 0830 62     Resp 11/03/22 0830 18     Temp 11/03/22 0830 98.2 F (36.8 C)     Temp Source 11/03/22 0830 Oral     SpO2 11/03/22 0830 96 %     Weight --      Height --      Head Circumference --      Peak Flow --      Pain Score 11/03/22 0831 10     Pain Loc --      Pain Education --      Exclude from Growth Chart --    No data  found.  Updated Vital Signs BP (!) 194/119 (BP Location: Left Arm) Comment: states did not take his b/p meds this morning.  Pulse 62   Temp 98.2 F (36.8 C) (  Oral)   Resp 18   SpO2 96%   Visual Acuity Right Eye Distance:   Left Eye Distance:   Bilateral Distance:    Right Eye Near:   Left Eye Near:    Bilateral Near:     Physical Exam Vitals reviewed.  Constitutional:      General: He is not in acute distress.    Appearance: Normal appearance. He is normal weight. He is not toxic-appearing.  HENT:     Head: Normocephalic and atraumatic.  Cardiovascular:     Rate and Rhythm: Normal rate and regular rhythm.  Pulmonary:     Effort: Pulmonary effort is normal.     Breath sounds: Normal breath sounds.  Musculoskeletal:        General: No deformity.     Comments: Spectrum of the back shows paraspinal musculature on the left from T7-L1 with mild swelling compared to the right.  Spasticity palpable of the musculature.  There is tenderness to palpation over the costovertebral joint on the left from the 8 to T11 which causes radiating pain to the mid axillary line of the associated ribs.  No crepitus.  Skin:    General: Skin is warm.     Findings: No erythema or rash.  Neurological:     General: No focal deficit present.     Mental Status: He is alert and oriented to person, place, and time.     Sensory: No sensory deficit.      UC Treatments / Results  Labs (all labs ordered are listed, but only abnormal results are displayed) Labs Reviewed - No data to display  EKG   Radiology No results found.  Procedures Procedures (including critical care time)  Medications Ordered in UC Medications  ketorolac (TORADOL) injection 60 mg (60 mg Intramuscular Given 11/03/22 0858)  lidocaine (PF) (XYLOCAINE) 1 % injection 30 mL (30 mLs Other Given 11/03/22 0858)      Initial Impression / Assessment and Plan / UC Course  I have reviewed the triage vital signs and the nursing  notes.  Pertinent labs & imaging results that were available during my care of the patient were reviewed by me and considered in my medical decision making (see chart for details).     Left thoracic muscle strain with spasm - No concern for fractures or dislocation of ribs today.  The patient has spasticity of the left paraspinal musculature and latissimus dorsi. - Toradol shot given today with some relief. - Trigger point injection along the left paraspinal musculature also gave some relief. - We discussed the use of heat and lidocaine patches with tylenol - Flexeril 10mg  PO BID prn sent in for breakthrough pain x 3 days. Pt counseled not to drive after taking  - Work note given to avoid heavy lifting - The patient voiced understanding and agreement with the plan  Trigger Point Injection of the paraspinal and latissimus dorsi muscles Consent obtained and verified. Time-out conducted. Noted no overlying erythema, induration, or other signs of local infection. Skin was prepped in a sterile fashion. Topical analgesic spray: None Completed without difficulty and patient tolerated well.  I injected 2 trigger points along the left paraspinal muscles at the T9-L1 level and 2 trigger points along the latissimus dorsi muscles at the T7-T9 levels below the scapula. Meds: 1% Xylocaine a total of 2-3 cc was injected into each trigger point Advised to call if fevers/chills, erythema, induration, drainage, or persistent bleeding.    Final Clinical Impressions(s) / UC  Diagnoses   Final diagnoses:  Muscle spasm of back  Muscle strain     Discharge Instructions      Continue to use lidocaine patches and heat over the affected area Continue use of Tylenol for pain 3 times a day You can take a muscle relaxer as needed as prescribed.  Take caution with this as it may make you sleepy and do not drive a vehicle after taking. Continue gentle range of motion over the next several days but avoid  lifting anything heavy.  Today you received an injection with Lidocaine. This injection is usually done in response to pain and there is some "numbing medicine" (Lidocaine) in the shot, so the injected area may be numb and feel really good for the next couple of hours. The numbing medicine usually wears off in 2-3 hours, and then your pain level may be back to where it was before the injection.    You may actually experience a small (as in 10%) INCREASE in pressure sensation in the first 24 hours---that is common.  Things to watch out for that you should contact us or a health care provider urgently would include: 1. Unusual (as in more than 10%) increase in pain 2. New fever > 101.5 3. New swelling or redness of the injected area. 4. Streaking of red lines around the area injected.  Do not hesitate to call or reach out with any questions or concerns.      ED Prescriptions     Medication Sig Dispense Auth. Provider   cyclobenzaprine (FLEXERIL) 10 MG tablet Take 1 tablet (10 mg total) by mouth 2 (two) times daily as needed for up to 3 days for muscle spasms (Take if Tylenol, lidocain patches, and heat does not relieve pain). 6 tablet Ivor Messier, MD      PDMP not reviewed this encounter.   Ivor Messier, MD 11/03/22 [phone]

## 2022-11-03 NOTE — ED Triage Notes (Signed)
Pt c/o lt side upper to lower back pain x1wk. Denies injury but states feels like he may have moved the wrong way. States taking OTC meds with no relief.

## 2022-11-03 NOTE — Telephone Encounter (Signed)
Pt seen in ER for back pain and BP was 194/119, he came to our office stating he was out of carvedilol, furosemide, and spiro   Per f/u with pharmacy [facility]) pt received carvedilol, losartan, and spiro in April, furosemide and bidil in June, and has never gotten [place]  Discussing with pt he is a little confused on the meds and it seems he has been taking them sparingly  Discussed all with Anna Genre, PA, will have pt restart on carvedilol 6.25 bid, farxiga 10 daily, furosemide 20 mg daily, losartan 100 mg daily, spiro 25 mg daily. RXs updated with [facility] pharmacy they will ship all out to pt.

## 2022-11-03 NOTE — Telephone Encounter (Signed)
Charles Cervantes requires PA, samples given, pharm team will get auth  Medication Samples have been provided to the patient.  Drug name: farxiga       Strength: 10mg         Qty: 1  LOT: [id number]  Exp.Date: 04/02/25  Dosing instructions: take 1 tab daily  The patient has been instructed regarding the correct time, dose, and frequency of taking this medication, including desired effects and most common side effects.   [name] 10:56 AM 11/03/2022   Pt will return Wed 11/6 for nurse visit to check BP, he is aware to bring all meds to this appointment

## 2022-11-04 ENCOUNTER — Telehealth (HOSPITAL_COMMUNITY): Payer: Self-pay | Admitting: Pharmacy Technician

## 2022-11-04 NOTE — Telephone Encounter (Signed)
Patient Advocate Encounter   Received notification from [facility] that prior authorization for Charles Cervantes is required.   PA submitted on CoverMyMeds [employer] [facility] Status is pending   Will continue to follow.

## 2022-11-07 ENCOUNTER — Other Ambulatory Visit (HOSPITAL_COMMUNITY): Payer: Self-pay

## 2022-11-08 ENCOUNTER — Other Ambulatory Visit (HOSPITAL_COMMUNITY): Payer: Self-pay

## 2022-11-08 NOTE — Telephone Encounter (Signed)
Advanced [facility] Patient Advocate Encounter  Prior Authorization for Marcelline Deist has been approved.    PA# [id number] Effective dates: 11/04/22 through 11/04/23  Patients co-pay is $4  Archer Asa, CPhT

## 2022-11-11 ENCOUNTER — Other Ambulatory Visit (HOSPITAL_COMMUNITY): Payer: Self-pay

## 2022-11-11 ENCOUNTER — Other Ambulatory Visit: Payer: Self-pay

## 2022-11-11 ENCOUNTER — Ambulatory Visit (HOSPITAL_COMMUNITY)
Admission: RE | Admit: 2022-11-11 | Discharge: 2022-11-11 | Disposition: A | Discharge status: Home / Self Care | Payer: Medicaid Other | Source: Ambulatory Visit | Attending: Cardiology

## 2022-11-11 VITALS — BP 156/100 | HR 69 | Wt 184.0 lb

## 2022-11-11 DIAGNOSIS — I10 Essential (primary) hypertension: Secondary | ICD-10-CM

## 2022-11-11 DIAGNOSIS — I5022 Chronic systolic (congestive) heart failure: Secondary | ICD-10-CM

## 2022-11-11 MED ORDER — DAPAGLIFLOZIN PROPANEDIOL 10 MG PO TABS
10.0000 mg | ORAL_TABLET | Freq: Every day | ORAL | 3 refills | Status: DC
  Filled 2022-11-18: qty 90, 90d supply, fill #0

## 2022-11-11 MED ORDER — LOSARTAN POTASSIUM 100 MG PO TABS: 100.0000 mg | ORAL_TABLET | Freq: Every day | ORAL | Status: DC

## 2022-11-11 MED ORDER — FUROSEMIDE 20 MG PO TABS: 20.0000 mg | ORAL_TABLET | Freq: Every day | ORAL | Status: DC

## 2022-11-11 MED ORDER — SPIRONOLACTONE 25 MG PO TABS
25.0000 mg | ORAL_TABLET | Freq: Every day | ORAL | 3 refills | Status: DC
  Filled 2022-11-18: qty 90, 90d supply, fill #0

## 2022-11-11 MED ORDER — CARVEDILOL 6.25 MG PO TABS
6.2500 mg | ORAL_TABLET | Freq: Two times a day (BID) | ORAL | 3 refills | Status: DC
  Filled 2022-11-18: qty 180, 90d supply, fill #0

## 2022-11-11 NOTE — Patient Instructions (Addendum)
Please take ALL MEDICATIONS as prescribed:  Losartan 100 mg Daily Carvedilol 6.25 mg Twice daily  Spironolactone 25 mg Daily Farxiga 10 mg Daily Furosemide 20 mg Daily  Do the following things EVERYDAY: Weigh yourself in the morning before breakfast. Write it down and keep it in a log. Take your medicines as prescribed Eat low salt foods--Limit salt (sodium) to 2000 mg per day.  Stay as active as you can everyday Limit all fluids for the day to less than 2 liters  Keep follow up appointment as scheduled Tuesday 11/29/22

## 2022-11-11 NOTE — Progress Notes (Signed)
Pt in for BP check and Med review, he did bring all pill bottles with him, reports hasn't taken anything yet today. Pt given water and took his Losartan 100 mg and Farxiga 10 mg.  Pt reports he started [place] yesterday, has been taking Losartan 50 mg daily, and Furosemide only when needed last dose Wed 11/6. Bottles for Carvedilol and Spironolactone are from April 2024 and are empty

## 2022-11-11 NOTE — Progress Notes (Signed)
BP down to 156/100, discussed/reviewed all with Brynda Peon, NP she states no changes at this time, ensure pt gets meds from pharmacy and takes as prescribed.  Pt aware, agreeable, and verbalized understanding. Med list provided, carvedilol, farxiga, and spiro all sent to [facility] pharm to be delivered to pt

## 2022-11-14 ENCOUNTER — Other Ambulatory Visit (HOSPITAL_COMMUNITY): Payer: Self-pay

## 2022-11-18 ENCOUNTER — Other Ambulatory Visit (HOSPITAL_COMMUNITY): Payer: Self-pay

## 2022-11-23 ENCOUNTER — Other Ambulatory Visit (HOSPITAL_COMMUNITY): Payer: Self-pay

## 2022-11-24 ENCOUNTER — Emergency Department (HOSPITAL_COMMUNITY)
Admission: EM | Admit: 2022-11-24 | Discharge: 2022-11-24 | Disposition: A | Discharge status: Home / Self Care | Payer: Medicaid Other | Attending: Emergency Medicine | Admitting: Emergency Medicine

## 2022-11-24 ENCOUNTER — Ambulatory Visit (HOSPITAL_COMMUNITY): Payer: Medicaid Other

## 2022-11-24 ENCOUNTER — Other Ambulatory Visit: Payer: Self-pay

## 2022-11-24 ENCOUNTER — Ambulatory Visit (HOSPITAL_COMMUNITY)
Admission: EM | Admit: 2022-11-24 | Discharge: 2022-11-24 | Disposition: A | Discharge status: Home / Self Care | Payer: Medicaid Other | Attending: Family Medicine | Admitting: Family Medicine

## 2022-11-24 ENCOUNTER — Encounter (HOSPITAL_COMMUNITY): Payer: Self-pay

## 2022-11-24 ENCOUNTER — Emergency Department (HOSPITAL_COMMUNITY): Payer: Medicaid Other

## 2022-11-24 ENCOUNTER — Encounter (HOSPITAL_COMMUNITY): Payer: Self-pay | Admitting: Emergency Medicine

## 2022-11-24 DIAGNOSIS — I110 Hypertensive heart disease with heart failure: Secondary | ICD-10-CM | POA: Insufficient documentation

## 2022-11-24 DIAGNOSIS — Z79899 Other long term (current) drug therapy: Secondary | ICD-10-CM | POA: Diagnosis not present

## 2022-11-24 DIAGNOSIS — J449 Chronic obstructive pulmonary disease, unspecified: Secondary | ICD-10-CM | POA: Insufficient documentation

## 2022-11-24 DIAGNOSIS — I509 Heart failure, unspecified: Secondary | ICD-10-CM | POA: Insufficient documentation

## 2022-11-24 DIAGNOSIS — I10 Essential (primary) hypertension: Secondary | ICD-10-CM | POA: Diagnosis not present

## 2022-11-24 DIAGNOSIS — M546 Pain in thoracic spine: Secondary | ICD-10-CM

## 2022-11-24 DIAGNOSIS — R001 Bradycardia, unspecified: Secondary | ICD-10-CM | POA: Diagnosis not present

## 2022-11-24 LAB — CBC WITH DIFFERENTIAL/PLATELET
Abs Immature Granulocytes: 0.01 10*3/uL (ref 0.00–0.07)
Basophils Absolute: 0 10*3/uL (ref 0.0–0.1)
Basophils Relative: 1 %
Eosinophils Absolute: 0.3 10*3/uL (ref 0.0–0.5)
Eosinophils Relative: 7 %
HCT: 48.4 % (ref 39.0–52.0)
Hemoglobin: 16 g/dL (ref 13.0–17.0)
Immature Granulocytes: 0 %
Lymphocytes Relative: 35 %
Lymphs Abs: 1.5 10*3/uL (ref 0.7–4.0)
MCH: 30.1 pg (ref 26.0–34.0)
MCHC: 33.1 g/dL (ref 30.0–36.0)
MCV: 91.1 fL (ref 80.0–100.0)
Monocytes Absolute: 0.4 10*3/uL (ref 0.1–1.0)
Monocytes Relative: 9 %
Neutro Abs: 2.1 10*3/uL (ref 1.7–7.7)
Neutrophils Relative %: 48 %
Platelets: 270 10*3/uL (ref 150–400)
RBC: 5.31 MIL/uL (ref 4.22–5.81)
RDW: 13.2 % (ref 11.5–15.5)
WBC: 4.4 10*3/uL (ref 4.0–10.5)
nRBC: 0 % (ref 0.0–0.2)

## 2022-11-24 LAB — BASIC METABOLIC PANEL
Anion gap: 7 (ref 5–15)
BUN: 12 mg/dL (ref 8–23)
CO2: 23 mmol/L (ref 22–32)
Calcium: 8.7 mg/dL — ABNORMAL LOW (ref 8.9–10.3)
Chloride: 104 mmol/L (ref 98–111)
Creatinine, Ser: 1.26 mg/dL — ABNORMAL HIGH (ref 0.61–1.24)
GFR, Estimated: >60 mL/min (ref >60)
Glucose, Bld: 100 mg/dL — ABNORMAL HIGH (ref 70–99)
Potassium: 4.3 mmol/L (ref 3.5–5.1)
Sodium: 134 mmol/L — ABNORMAL LOW (ref 135–145)

## 2022-11-24 LAB — TROPONIN I (HIGH SENSITIVITY): Troponin I (High Sensitivity): 13 ng/L (ref <18)

## 2022-11-24 MED ORDER — KETOROLAC TROMETHAMINE 60 MG/2ML IM SOLN: 60.0000 mg | Freq: Once | INTRAMUSCULAR | Status: DC

## 2022-11-24 MED ORDER — LIDOCAINE 5 % EX PTCH
1.0000 | MEDICATED_PATCH | CUTANEOUS | 0 refills | Status: DC
Start: 2022-11-24 — End: 2022-11-29

## 2022-11-24 MED ORDER — ONDANSETRON HCL 4 MG/2ML IJ SOLN
4.0000 mg | Freq: Once | INTRAMUSCULAR | Status: AC
  Administered 2022-11-24: 4 mg via INTRAVENOUS
  Filled 2022-11-24: qty 2

## 2022-11-24 MED ORDER — KETOROLAC TROMETHAMINE 60 MG/2ML IM SOLN
INTRAMUSCULAR | Status: AC
  Filled 2022-11-24: qty 2

## 2022-11-24 MED ORDER — IOHEXOL 350 MG/ML SOLN
100.0000 mL | Freq: Once | INTRAVENOUS | Status: AC | PRN
  Administered 2022-11-24: 100 mL via INTRAVENOUS

## 2022-11-24 MED ORDER — HYDRALAZINE HCL 20 MG/ML IJ SOLN
10.0000 mg | Freq: Once | INTRAMUSCULAR | Status: AC
  Administered 2022-11-24: 10 mg via INTRAVENOUS
  Filled 2022-11-24: qty 1

## 2022-11-24 MED ORDER — METHOCARBAMOL 500 MG PO TABS: 500.0000 mg | ORAL_TABLET | Freq: Two times a day (BID) | ORAL | 0 refills | Status: DC

## 2022-11-24 MED ORDER — FENTANYL CITRATE PF 50 MCG/ML IJ SOSY
50.0000 ug | PREFILLED_SYRINGE | Freq: Once | INTRAMUSCULAR | Status: AC
  Administered 2022-11-24: 50 ug via INTRAVENOUS
  Filled 2022-11-24: qty 1

## 2022-11-24 MED ORDER — DIAZEPAM 5 MG/ML IJ SOLN
2.5000 mg | Freq: Once | INTRAMUSCULAR | Status: AC
  Administered 2022-11-24: 2.5 mg via INTRAVENOUS
  Filled 2022-11-24: qty 2

## 2022-11-24 MED ORDER — MORPHINE SULFATE (PF) 4 MG/ML IV SOLN
4.0000 mg | Freq: Once | INTRAVENOUS | Status: AC
  Administered 2022-11-24: 4 mg via INTRAVENOUS
  Filled 2022-11-24: qty 1

## 2022-11-24 NOTE — Discharge Instructions (Signed)
Your workup today was reassuring.  CT scan did not show any evidence of concerning cause of your pain.  This is likely musculoskeletal in nature.  Continue taking the muscle relaxant that you are prescribed.  Return for any concerning symptoms.  Follow-up with your primary care provider.

## 2022-11-24 NOTE — ED Provider Notes (Signed)
[facility]    CSN: [id number] Arrival date & time: 11/24/22  8295      History   Chief Complaint Chief Complaint  Patient presents with   Back Pain    HPI Charles Cervantes is a 62 y.o. male.   Mr. Moynihan returns today with recurrent left-sided thoracic muscle pain and spasm.  He got some temporary relief from the trigger point injections last time and has used some Tiger balm and heat recently which have helped with the pain.  He does get some relief from gentle stretches as well.  The muscle relaxers did not help much and he has not been using lidocaine patches.  He denies any recurrent injury but has been lifting some light furniture again.  He denies distal numbness or weakness, falls, fever, chills, or unexplained weight loss.  The history is provided by the patient.  Back Pain Associated symptoms: no chest pain, no fever, no headaches, no numbness and no weakness     Past Medical History:  Diagnosis Date   Anxiety    CHF (congestive heart failure) (HCC)    takes Furosemide daily   Chronic back pain    Constipation    takes Colace daily   COPD (chronic obstructive pulmonary disease) (HCC)    Spiriva daily   Depression    takes Effexor daily and Risperdal nightly   Dizziness    H/O blood clots 01/03/2010   in leg   Headache    occasionally   Hyperlipidemia    Hypertension    takes Losartan and Coreg daily   Muscle spasm    takes Zizanidine daily as needed   Shortness of breath dyspnea    more with exertion but sometimes notices with lying/sitting   Sleep apnea    Substance abuse (HCC)    Weakness    tingling and numbness    Patient Active Problem List   Diagnosis Date Noted   Pain in left knee 04/26/2021   Cubital tunnel syndrome on right 08/25/2020   Sleep concern 08/25/2020   Foot pain, left 06/16/2020   Impingement syndrome of left shoulder 06/16/2020   Encounter for hepatitis C virus screening test for high risk patient 06/16/2020   NICM  (nonischemic cardiomyopathy) (HCC) 12/04/2015   Lumbar back pain 09/03/2014   Spinal stenosis in cervical region 04/21/2014   PTSD (post-traumatic stress disorder) 11/04/2013   HTN (hypertension) 01/01/2013   Tobacco use disorder 09/05/2012   Chronic systolic heart failure (HCC) 05/08/2012   Alcohol abuse 05/01/2012   Cocaine abuse (HCC) 05/01/2012    Past Surgical History:  Procedure Laterality Date   LEFT AND RIGHT HEART CATHETERIZATION WITH CORONARY ANGIOGRAM N/A 11/20/2012   Procedure: LEFT AND RIGHT HEART CATHETERIZATION WITH CORONARY ANGIOGRAM;  Surgeon: Dolores Patty, MD;  Location: [facility] CATH LAB;  Service: Cardiovascular;  Laterality: N/A;   Left Second finger surgery     LUMBAR FUSION  09/05/2014   L5 S1       Home Medications    Prior to Admission medications   Medication Sig Start Date End Date Taking? Authorizing Provider  acetaminophen (TYLENOL) 500 MG tablet Take 2 tablets (1,000 mg total) by mouth every 6 (six) hours as needed. 09/28/20   Cathleen Corti, MD  carvedilol (COREG) 6.25 MG tablet Take 1 tablet (6.25 mg total) by mouth 2 (two) times daily with a meal. 11/11/22   Bensimhon, Bevelyn Buckles, MD  dapagliflozin propanediol (FARXIGA) 10 MG TABS tablet Take 1 tablet (  10 mg total) by mouth daily before breakfast. 11/11/22   Bensimhon, Bevelyn Buckles, MD  furosemide (LASIX) 20 MG tablet Take 1 tablet (20 mg total) by mouth daily. 11/11/22   Bensimhon, Bevelyn Buckles, MD  losartan (COZAAR) 100 MG tablet Take 1 tablet (100 mg total) by mouth daily. 11/11/22   Bensimhon, Bevelyn Buckles, MD  spironolactone (ALDACTONE) 25 MG tablet Take 1 tablet (25 mg total) by mouth daily. 11/11/22   Bensimhon, Bevelyn Buckles, MD    Family History Family History  Problem Relation Age of Onset   Prostate cancer Paternal Uncle    Colon cancer Neg Hx    Colon polyps Neg Hx    Esophageal cancer Neg Hx    Rectal cancer Neg Hx    Stomach cancer Neg Hx     Social History Social History   Tobacco Use    Smoking status: Every Day    Current packs/day: 0.25    Average packs/day: 0.3 packs/day for 20.0 years (5.0 ttl pk-yrs)    Types: Cigarettes   Smokeless tobacco: Never   Tobacco comments:    1-2 cigs per day (08/15/14)  Vaping Use   Vaping status: Every Day  Substance Use Topics   Alcohol use: No    Alcohol/week: 0.0 standard drinks of alcohol    Comment: stopped in april 2014   Drug use: Not Currently    Types: Cocaine     Allergies   Ace inhibitors   Review of Systems Review of Systems  Constitutional:  Negative for activity change, appetite change, chills, fatigue and fever.  Eyes:  Negative for visual disturbance.  Respiratory:  Negative for shortness of breath.   Cardiovascular:  Negative for chest pain, palpitations and leg swelling.  Gastrointestinal:  Negative for nausea and vomiting.  Genitourinary:  Negative for flank pain and hematuria.  Musculoskeletal:  Positive for back pain and myalgias. Negative for arthralgias, joint swelling, neck pain and neck stiffness.  Neurological:  Negative for dizziness, facial asymmetry, speech difficulty, weakness, light-headedness, numbness and headaches.  Psychiatric/Behavioral:  Negative for confusion.      Physical Exam Triage Vital Signs ED Triage Vitals  Encounter Vitals Group     BP 11/24/22 0934 (!) 196/104     Systolic BP Percentile --      Diastolic BP Percentile --      Pulse Rate 11/24/22 0934 96     Resp 11/24/22 0934 17     Temp 11/24/22 0934 97.7 F (36.5 C)     Temp Source 11/24/22 0934 Oral     SpO2 11/24/22 0934 98 %     Weight --      Height --      Head Circumference --      Peak Flow --      Pain Score 11/24/22 0937 10     Pain Loc --      Pain Education --      Exclude from Growth Chart --    No data found.  Updated Vital Signs BP (!) 196/104 (BP Location: Right Arm)   Pulse 96   Temp 97.7 F (36.5 C) (Oral)   Resp 17   SpO2 98%   Visual Acuity Right Eye Distance:   Left Eye  Distance:   Bilateral Distance:    Right Eye Near:   Left Eye Near:    Bilateral Near:     Physical Exam Vitals reviewed.  Constitutional:      Appearance: He is normal weight.  HENT:     Head: Normocephalic and atraumatic.  Eyes:     General: No scleral icterus.    Extraocular Movements: Extraocular movements intact.     Pupils: Pupils are equal, round, and reactive to light.  Neck:     Comments: No JVD Cardiovascular:     Rate and Rhythm: Normal rate and regular rhythm.     Pulses: Normal pulses.     Heart sounds: Murmur (Soft 2+ murmur heard only over the upper left sternal border) heard.     No friction rub. No gallop.  Pulmonary:     Effort: Pulmonary effort is normal. No respiratory distress.     Breath sounds: Normal breath sounds. No wheezing or rales.  Abdominal:     General: Abdomen is flat. There is no distension.     Palpations: There is no mass.     Tenderness: There is no right CVA tenderness, left CVA tenderness or guarding.  Musculoskeletal:     Cervical back: Normal range of motion. No rigidity.     Comments: Inspection back shows some mild edema of the L paraspinal muscles and latissimus dorsi just left lateral of the spinous processes.  There is no tenderness to palpation over the midline or transverse spinous processes. Spasticity noted of the L rhomboids Compression does not elicit pain. No crepitus palpated along the ribs No CVA tenderness  Skin:    Capillary Refill: Capillary refill takes 2 to 3 seconds.  Neurological:     General: No focal deficit present.     Mental Status: He is alert and oriented to person, place, and time.     Cranial Nerves: No cranial nerve deficit.     Sensory: No sensory deficit.     Motor: No weakness.     Coordination: Coordination normal.     Gait: Gait normal.  Psychiatric:        Mood and Affect: Mood normal.        Behavior: Behavior normal.        Thought Content: Thought content normal.      UC Treatments  / Results  Labs (all labs ordered are listed, but only abnormal results are displayed) Labs Reviewed - No data to display  EKG   Radiology No results found.  Procedures Procedures (including critical care time)  Medications Ordered in UC Medications - No data to display  Initial Impression / Assessment and Plan / UC Course  I have reviewed the triage vital signs and the nursing notes.  Pertinent labs & imaging results that were available during my care of the patient were reviewed by me and considered in my medical decision making (see chart for details).     Left thoracic muscle strain with spasm - Although the patient has had some minimal response to musculoskeletal therapies, he does have a markedly elevated blood pressure of 196/104.  Although he is asymptomatic he did have markedly elevated blood pressure at previous visit.  He is currently waiting a cardiology workup, however given the commendation of his history, symptoms, and x-ray findings below I advised that he go to the emergency room for immediate evaluation to rule out a dissecting aorta. - X-ray: Independent review shows no abnormalities in the bones.  There is an apparent enlargement of the aorta.  On review and comparison of previous x-rays there is some enlargement present but slightly worsened today.  There appears to be some extension over the right border of the heart.  - In the  event of this not being cardiac in nature, we discussed the need to start range of motion/mobility exercises for the thoracic spine.  Programme researcher, broadcasting/film/video given. - If there is no involvement of the aorta, he is likely reaggravated in this due to lifting furniture. -The possible etiology of this being a dissecting aorta was explained in detail to the patient as well as the need for immediate follow-up in the emergency room. - He expressed understanding of the process and agreed with the need for evaluation.  HTN, asymptomatic - BP 196/104 in  clinic. We discussed the patient's markedly elevated blood pressure.  He did take his blood pressure medication today.  He is asymptomatic with no red flag symptoms currently -There is likely an element of pain but he does have chronically high blood pressure which puts him at risk for stroke/document as well as the above mentioned risk. - He has an appointment set up with cardiologist for an echocardiogram and has been following up with his PCP for this. - The patient voiced understanding and agreement.  Final Clinical Impressions(s) / UC Diagnoses   Final diagnoses:  Acute left-sided thoracic back pain  Essential hypertension     Discharge Instructions      Your back pain may be musculoskeletal in nature. However, with your blood pressure being at dangerously high levels and chest x-ray is concerning for some enlargement of your aorta we recommend that you go to the emergency room immediately without a life-threatening condition.      ED Prescriptions   None    PDMP not reviewed this encounter.   Ivor Messier, MD 11/24/22 1155

## 2022-11-24 NOTE — ED Notes (Signed)
PT asking for pain medication, EDPA aware

## 2022-11-24 NOTE — ED Triage Notes (Signed)
Pt to ED via pov from UC. Pt went to UC for back spasms. Pt was instructed to come to ED d/t high BP and chest x-ray being concerning for enlargement of aorta. Pt c/o pain in middle of back, denies shortness of breath, chest pain, nausea or vomiting.

## 2022-11-24 NOTE — ED Triage Notes (Signed)
Pt c/o lower back spasm that have been occurring for almost 1 month. He was here 2 weeks ago and receive shots and medication that have not helped pain.

## 2022-11-24 NOTE — ED Notes (Signed)
Patient transported to CT 

## 2022-11-24 NOTE — ED Provider Notes (Addendum)
[facility] AT [facility] Provider Note   CSN: [id number] Arrival date & time: 11/24/22  1135     History  Chief Complaint  Patient presents with   Back Pain    Charles Cervantes is a 62 y.o. male.  62 year old male presents today for concern of left-sided mid/upper back pain that has been ongoing now for couple weeks.  Was seen at urgent care 2 weeks ago and was given trigger point injections with some improvement but states that his pain started up again and is even more severe than before.  He was seen at urgent care today and they recommended that he come into the emergency room due to his significantly elevated blood pressure, and concern for widened aorta on x-ray.  He denies chest pain, diaphoresis, leg weakness, paresthesias to bilateral lower or upper extremities.  States he has antihypertensives that he recently restarted due to being out of them.  The history is provided by the patient. No language interpreter was used.       Home Medications Prior to Admission medications   Medication Sig Start Date End Date Taking? Authorizing Provider  acetaminophen (TYLENOL) 500 MG tablet Take 2 tablets (1,000 mg total) by mouth every 6 (six) hours as needed. 09/28/20   Cathleen Corti, MD  carvedilol (COREG) 6.25 MG tablet Take 1 tablet (6.25 mg total) by mouth 2 (two) times daily with a meal. 11/11/22   Bensimhon, Bevelyn Buckles, MD  dapagliflozin propanediol (FARXIGA) 10 MG TABS tablet Take 1 tablet (10 mg total) by mouth daily before breakfast. 11/11/22   Bensimhon, Bevelyn Buckles, MD  furosemide (LASIX) 20 MG tablet Take 1 tablet (20 mg total) by mouth daily. 11/11/22   Bensimhon, Bevelyn Buckles, MD  losartan (COZAAR) 100 MG tablet Take 1 tablet (100 mg total) by mouth daily. 11/11/22   Bensimhon, Bevelyn Buckles, MD  spironolactone (ALDACTONE) 25 MG tablet Take 1 tablet (25 mg total) by mouth daily. 11/11/22   Bensimhon, Bevelyn Buckles, MD      Allergies    Ace inhibitors     Review of Systems   Review of Systems  Constitutional:  Negative for chills, diaphoresis and fever.  Respiratory:  Negative for shortness of breath.   Cardiovascular:  Negative for chest pain.  Gastrointestinal:  Negative for abdominal pain, nausea and vomiting.  Musculoskeletal:  Positive for back pain and myalgias. Negative for arthralgias.  Neurological:  Negative for light-headedness.  All other systems reviewed and are negative.   Physical Exam Updated Vital Signs BP (!) 173/112   Pulse (!) 54   Temp 97.6 F (36.4 C) (Oral)   Resp 18   Ht 6\' 1"  (1.854 m)   Wt 83 kg   SpO2 97%   BMI 24.14 kg/m  Physical Exam Vitals and nursing note reviewed.  Constitutional:      General: He is not in acute distress.    Appearance: Normal appearance. He is not ill-appearing.  HENT:     Head: Normocephalic and atraumatic.     Nose: Nose normal.  Eyes:     Conjunctiva/sclera: Conjunctivae normal.  Cardiovascular:     Rate and Rhythm: Regular rhythm. Bradycardia present.     Heart sounds: Normal heart sounds.  Pulmonary:     Effort: Pulmonary effort is normal. No respiratory distress.  Abdominal:     General: There is no distension.     Palpations: Abdomen is soft.     Tenderness: There is no abdominal  tenderness. There is no guarding.  Musculoskeletal:        General: No deformity. Normal range of motion.     Cervical back: Normal range of motion.  Skin:    Findings: No rash.  Neurological:     Mental Status: He is alert.     ED Results / Procedures / Treatments   Labs (all labs ordered are listed, but only abnormal results are displayed) Labs Reviewed  BASIC METABOLIC PANEL - Abnormal; Notable for the following components:      Result Value   Sodium 134 (*)    Glucose, Bld 100 (*)    Creatinine, Ser 1.26 (*)    Calcium 8.7 (*)    All other components within normal limits  CBC WITH DIFFERENTIAL/PLATELET  TROPONIN I (HIGH SENSITIVITY)  TROPONIN I (HIGH  SENSITIVITY)    EKG None  Radiology DG Chest 2 View  Result Date: 11/24/2022 CLINICAL DATA:  Thoracic pain, rib pain EXAM: CHEST - 2 VIEW COMPARISON:  09/17/2019 FINDINGS: The lungs are symmetrically well expanded. Nodular densities the lung bases bilaterally are in keeping with nipple shadows. Lungs are otherwise clear. No pneumothorax or pleural effusion. Cardiac size within normal limits. Pulmonary vascularity is normal. No acute bone abnormality. Osseous structures are age-appropriate. IMPRESSION: No active cardiopulmonary disease. Electronically Signed   By: Helyn Numbers M.D.   On: 11/24/2022 13:23    Procedures Procedures    Medications Ordered in ED Medications  hydrALAZINE (APRESOLINE) injection 10 mg (has no administration in time range)  fentaNYL (SUBLIMAZE) injection 50 mcg (50 mcg Intravenous Given 11/24/22 1309)  ondansetron (ZOFRAN) injection 4 mg (4 mg Intravenous Given 11/24/22 1308)  diazepam (VALIUM) injection 2.5 mg (2.5 mg Intravenous Given 11/24/22 1403)    ED Course/ Medical Decision Making/ A&P                                 Medical Decision Making Amount and/or Complexity of Data Reviewed Labs: ordered. Radiology: ordered.  Risk Prescription drug management.   Medical Decision Making / ED Course   This patient presents to the ED for concern of back pain, this involves an extensive number of treatment options, and is a complaint that carries with it a high risk of complications and morbidity.  The differential diagnosis includes muscle strain, muscle spasm, dissection, ACS, PE, pneumonia  MDM: 62 year old male presents today for concern of left upper chest pain.  Sent from urgent care due to concern of dissection.  Pain is severe and patient cannot find a comfortable position.  CBC is unremarkable.  BMP with creatinine 1.26 otherwise without acute concern.  Troponin of 13.  Symptoms ongoing for over 24 hours with this particular episode.  Low  suspicion for ACS given no definite chest pain. Chest x-ray was performed at urgent care.  Requested this to be read stat.  No acute finding on chest x-ray from a cardiopulmonary standpoint.  CT dissection study pending at the end my shift.  Signed out to oncoming provider.  Initially given 50 mcg of fentanyl without improvement in pain.  2.5 mg of Valium given in case muscle spasm was playing a role.  No improvement following these medications.  A dose of morphine was ordered at the end of my shift.  BP still elevated.  Will give 10 of hydralazine as well as additional pain control.  CT resulted prior to shift change.  No concerning findings.  Particularly no dissection.  Will discharge patient with Robaxin, lidocaine patch.  PCP referral given.  Strict return precautions given.  Patient voices understanding and is in agreement with plan.  He has a cardiology follow-up scheduled for Tuesday.  He will follow-up with them and establish a PCP.  BP improved to 172/108 prior to discharge.  I have asked nursing to update his blood pressure prior to discharge.  He is without chest pain, shortness of breath, or other symptoms that would indicate endorgan damage from hypertension.  He will continue his home BP meds.  Lab Tests: -I ordered, reviewed, and interpreted labs.   The pertinent results include:   Labs Reviewed  BASIC METABOLIC PANEL - Abnormal; Notable for the following components:      Result Value   Sodium 134 (*)    Glucose, Bld 100 (*)    Creatinine, Ser 1.26 (*)    Calcium 8.7 (*)    All other components within normal limits  CBC WITH DIFFERENTIAL/PLATELET  TROPONIN I (HIGH SENSITIVITY)  TROPONIN I (HIGH SENSITIVITY)      EKG  EKG Interpretation Date/Time:    Ventricular Rate:    PR Interval:    QRS Duration:    QT Interval:    QTC Calculation:   R Axis:      Text Interpretation:           Imaging Studies ordered: I ordered imaging studies including CT  dissection study pending at the end of the shift I independently visualized and interpreted imaging. I agree with the radiologist interpretation   Medicines ordered and prescription drug management: Meds ordered this encounter  Medications   fentaNYL (SUBLIMAZE) injection 50 mcg   ondansetron (ZOFRAN) injection 4 mg   diazepam (VALIUM) injection 2.5 mg   hydrALAZINE (APRESOLINE) injection 10 mg    -I have reviewed the patients home medicines and have made adjustments as needed    Co morbidities that complicate the patient evaluation  Past Medical History:  Diagnosis Date   Anxiety    CHF (congestive heart failure) (HCC)    takes Furosemide daily   Chronic back pain    Constipation    takes Colace daily   COPD (chronic obstructive pulmonary disease) (HCC)    Spiriva daily   Depression    takes Effexor daily and Risperdal nightly   Dizziness    H/O blood clots 01/03/2010   in leg   Headache    occasionally   Hyperlipidemia    Hypertension    takes Losartan and Coreg daily   Muscle spasm    takes Zizanidine daily as needed   Shortness of breath dyspnea    more with exertion but sometimes notices with lying/sitting   Sleep apnea    Substance abuse (HCC)    Weakness    tingling and numbness      Dispostion: Signout to oncoming provider to follow-up on dissection study.  Final Clinical Impression(s) / ED Diagnoses Final diagnoses:  Left-sided thoracic back pain, unspecified chronicity    Rx / DC Orders ED Discharge Orders          Ordered    methocarbamol (ROBAXIN) 500 MG tablet  2 times daily        11/24/22 1532    lidocaine (LIDODERM) 5 %  Every 24 hours        11/24/22 1532              Marita Kansas, PA-C 11/24/22 1503    Karie Mainland,  Henri Baumler, PA-C 11/24/22 1534    Franne Forts, DO 12/03/22 1526

## 2022-11-24 NOTE — Discharge Instructions (Addendum)
Your back pain may be musculoskeletal in nature. However, with your blood pressure being at dangerously high levels and chest x-ray is concerning for some enlargement of your aorta we recommend that you go to the emergency room immediately without a life-threatening condition.

## 2022-11-24 NOTE — ED Notes (Signed)
Second trop cancelled after collected by EDPA Karie Mainland

## 2022-11-24 NOTE — ED Notes (Signed)
Pt pacing in the exam room due to pain. States he is not feeling better, BP has increased.Marland Kitchen EDPA aware, cardiac monitor ordered

## 2022-11-29 ENCOUNTER — Ambulatory Visit (HOSPITAL_BASED_OUTPATIENT_CLINIC_OR_DEPARTMENT_OTHER)
Admission: RE | Admit: 2022-11-29 | Discharge: 2022-11-29 | Disposition: A | Discharge status: Home / Self Care | Payer: Medicaid Other | Source: Ambulatory Visit | Attending: Family Medicine | Admitting: Family Medicine

## 2022-11-29 ENCOUNTER — Encounter: Payer: Self-pay | Admitting: *Deleted

## 2022-11-29 ENCOUNTER — Encounter (HOSPITAL_COMMUNITY): Payer: Self-pay | Admitting: Internal Medicine

## 2022-11-29 ENCOUNTER — Encounter (HOSPITAL_COMMUNITY): Payer: Self-pay

## 2022-11-29 ENCOUNTER — Ambulatory Visit (HOSPITAL_COMMUNITY)
Admission: RE | Admit: 2022-11-29 | Discharge: 2022-11-29 | Disposition: A | Discharge status: Home / Self Care | Payer: Medicaid Other | Source: Ambulatory Visit | Attending: Internal Medicine | Admitting: Internal Medicine

## 2022-11-29 ENCOUNTER — Ambulatory Visit (HOSPITAL_COMMUNITY)
Admission: EM | Admit: 2022-11-29 | Discharge: 2022-11-29 | Disposition: A | Discharge status: Home / Self Care | Payer: Medicaid Other | Attending: Emergency Medicine | Admitting: Emergency Medicine

## 2022-11-29 VITALS — BP 140/80 | HR 54 | Wt 185.2 lb

## 2022-11-29 DIAGNOSIS — I10 Essential (primary) hypertension: Secondary | ICD-10-CM

## 2022-11-29 DIAGNOSIS — G4733 Obstructive sleep apnea (adult) (pediatric): Secondary | ICD-10-CM | POA: Diagnosis not present

## 2022-11-29 DIAGNOSIS — I5022 Chronic systolic (congestive) heart failure: Secondary | ICD-10-CM | POA: Insufficient documentation

## 2022-11-29 DIAGNOSIS — I428 Other cardiomyopathies: Secondary | ICD-10-CM | POA: Diagnosis present

## 2022-11-29 DIAGNOSIS — Z006 Encounter for examination for normal comparison and control in clinical research program: Secondary | ICD-10-CM

## 2022-11-29 DIAGNOSIS — Z720 Tobacco use: Secondary | ICD-10-CM

## 2022-11-29 DIAGNOSIS — M546 Pain in thoracic spine: Secondary | ICD-10-CM

## 2022-11-29 MED ORDER — METHOCARBAMOL 500 MG PO TABS: 500.0000 mg | ORAL_TABLET | Freq: Two times a day (BID) | ORAL | 0 refills | Status: AC

## 2022-11-29 NOTE — Research (Signed)
SITE: [id number]     Subject # [id number]    Subprotocol: A  Inclusion Criteria  Patients who meet all of the following criteria are eligible for enrollment as study participants:  Yes No  Age > 61 years old X   Eligible to wear Holter Study X    Exclusion Criteria  Patients who meet any of these criteria are not eligible for enrollment as study participants: Yes No  1. Receiving any mechanical (respiratory or circulatory) or renal support therapy at Screening or during Visit #1.  X  2.  Any other conditions that in the opinion of the investigators are likely to prevent compliance with the study protocol or pose a safety concern if the subject participates in the study.  X  3. Poor tolerance, namely susceptible to severe skin allergies from ECG adhesive patch application.  X   Protocol: REV H                                     Residential Zip code*  [address] _ (First 3 digits ONLY)                                             [employer] Informed Consent   Subject Name: Charles Cervantes  Subject met inclusion and exclusion criteria.  The informed consent form, study requirements and expectations were reviewed with the subject. Subject had opportunity to read consent and questions and concerns were addressed prior to the signing of the consent form.  The subject verbalized understanding of the trial requirements.  The subject agreed to participate in the [employer] trial and signed the informed consent at 1050 on 11/29/2022.  The informed consent was obtained prior to performance of any protocol-specific procedures for the subject.  A copy of the signed informed consent was given to the subject and a copy was placed in the subject's medical record.   Mercer Pod D

## 2022-11-29 NOTE — Progress Notes (Signed)
  Echocardiogram 2D Echocardiogram has been performed.  Leda Roys RDCS 11/29/2022, 11:14 AM

## 2022-11-29 NOTE — Discharge Instructions (Addendum)
Please pick up the Lyrica that was prescribed at your [facility] visit  You can take the muscle relaxer (Robaxin) twice daily. If the medication makes you drowsy, take only at bed time.  Please contact the orthopedic clinic listed below to make an appointment. You will need to follow with a specialist since your pain is persisting. You can also go to the walk-in orthopedic urgent care ([facility])

## 2022-11-29 NOTE — Patient Instructions (Signed)
  Special Instructions // Education:  CARDIAC MRI Magnetic resonance imaging (MRI) is a painless test that produces images of the inside of the body without using Xrays.  During an MRI, strong magnets and radio waves work together in a Data processing manager to form detailed images.   MRI images may provide more details about a medical condition than X-rays, CT scans, and ultrasounds can provide.  You may be given earphones to listen for instructions.  You may eat a light breakfast and take medications as ordered with the exception of furosemide, hydrochlorothiazide, or spironolactone(fluid pill, other). If you are undergoing a stress MRI, please avoid stimulants for 12 hr prior to test. (Ie. Caffeine, nicotine, chocolate, or antihistamine medications)  An IV will be inserted into one of your veins. Contrast material will be injected into your IV. It will leave your body through your urine within a day. You may be told to drink plenty of fluids to help flush the contrast material out of your system.  You will be asked to remove all metal, including: Watch, jewelry, and other metal objects including hearing aids, hair pieces and dentures. Also wearable glucose monitoring systems (ie. Freestyle Libre and Omnipods) (Braces and fillings normally are not a problem.)   TEST WILL TAKE APPROXIMATELY 1 HOUR  PLEASE NOTIFY SCHEDULING AT LEAST 24 HOURS IN ADVANCE IF YOU ARE UNABLE TO KEEP YOUR APPOINTMENT. [phone]  For more information and frequently asked questions, please visit our website : [email or web address]  Please call the Cardiac Imaging Nurse Navigators with any questions/concerns. [phone] Office   Follow-Up in: 3 MONTHS WITH APP AS SCHEDULED   At the [facility], you and your health needs are our priority. We have a designated team specialized in the treatment of Heart Failure. This Care Team includes your primary Heart Failure Specialized Cardiologist  (physician), Advanced Practice Providers (APPs- Physician Assistants and Nurse Practitioners), and Pharmacist who all work together to provide you with the care you need, when you need it.   You may see any of the following providers on your designated Care Team at your next follow up:  Dr. Arvilla Meres Dr. Marca Ancona Dr. Dorthula Nettles Dr. Theresia Bough [name], NP Robbie Lis, [place] [facility] Aub[place], [place] Brynda Peon, NP [place] Lee, NP Karle Plumber, PharmD   Please be sure to bring in all your medications bottles to every appointment.   Need to Contact us:  If you have any questions or concerns before your next appointment please send [place] a message through [place] or call our office at [phone].    TO LEAVE A MESSAGE FOR THE NURSE SELECT OPTION 2, PLEASE LEAVE A MESSAGE INCLUDING: YOUR NAME DATE OF BIRTH CALL BACK NUMBER REASON FOR CALL**this is important as we prioritize the call backs  YOU WILL RECEIVE A CALL BACK THE SAME DAY AS LONG AS YOU CALL BEFORE 4:00 PM

## 2022-11-29 NOTE — Progress Notes (Signed)
ADVANCED HF CLINIC NOTE  Patient ID: Charles Cervantes, male   DOB: [birth year], 61 y.o.   MRN: [id number]  PCP: Ivery Quale, MD HF Cardiologist: Dr. Gala Romney  HPI: Charles Cervantes is a 62 y.o. male with systolic heart failure diagnosed in 2014 in [place] secondary to NICM (normal cors on cath), EF 20-25%. Other history includes polysubstance abuse (cocaine, tobacco and alcohol).  He moved from [facility] and was admitted to [facility] in 4/14 with a/c CHF.  Echo showed EF 20-25% with normal RV.  Discharge weight 147 pounds.  Admitted [facility] in 8/14 with HF. EF reported 50-55% on echo. 10/2012 bedside echo in our HF Clinic: EF 35-40%. In 11/2012 presented to clinic c/o recurrent CP and severe HF symptoms but seemed well compensated on exam R/L cath showed normal cors.   He was lost to f/u in 2016.   Readmitted to [facility] 9/14-17/21 with severe HTN and recurrent CP anfd HF. Reported he was taking his HF meds except his fluid pill. BP on arrival 163/118. Diuresed and BP controlled.   Echo 09/18/19 EF 20-25% RV mildly HK   Post hospital f/u 9/21 enrolled back in HF Clinic (previously discharged due to inappropriate behavior towards staff).  Echo 7/22 EF had improved to 40-45%, RV normal. Trival Charles. No AS.   Been in two treatment centers in [place] at [facility] 2/9-03/11/20. Then relapsed and went to [facility] 3/22.   Follow up 3/24 for surgical clearance prior to anticipated knee surgery for left medial meniscal tear repair.  Off several of his meds including Losartan, Lasix and Farxiga. He was volume overloaded. Meds restarted. Echo ordered.   Seen as urgent nurse visit on 04/22/22. Reported increased dyspnea. Had not been taking losartan, spiro or lasix. ReDS 47%. Meds restarted.   Echo (4/24) showed EF 30-35%, mild LVH, RV ok  Today he returns for HF follow up. Says he is doing well from a heart standpoint. Main issue is back spasms. Recently BP has been up but had been out of his medsNo edema, orthopnea or  PND  Echo today EF 35-40%   Cardiac Studies - Echo (4/24): EF 30-35%   - Echo (7/22): EF 40-45%  - Echo (9/21): EF 20-25% RV mildly HK in setting of severe HTN  - Echo (2/15): EF 35-40%  - CPX (2/15) FVC 2.90 (62%)  FEV1 2.22 (60%) FEV1/FVC 76%  Resting HR: 61 Peak HR: 111 (66% age predicted max HR) BP rest: 146/90 BP peak: 168/86 Peak VO2: 22.6 (64.1% predicted peak VO2) VE/VCO2 slope: 29.4 OUES: 2.49 Peak RER: 0.90  - RHC/LHC  (11/14) RA = 6  RV = 33/3/8  PA = 29/9 (18)  PCW = 8  Fick cardiac output/index = 6.4/3.2  PVR = 1.5 WU  FA sat = 95%  PA sat = 72%, 73%  SVC sat 72% Extensive calcification in proximal and mid LAD with only mild intraluminal stenosis with 20-30% stenosis.   - Echo (8/14): EF 50-55% at [facility]  - Echo (7/14): EF 30-35%  - Echo (4/14): EF 20-25%  ROS: All systems negative except as listed in HPI, PMH and Problem List.  Past Medical History:  Diagnosis Date   Anxiety    CHF (congestive heart failure) (HCC)    takes Furosemide daily   Chronic back pain    Constipation    takes Colace daily   COPD (chronic obstructive pulmonary disease) (HCC)    Spiriva daily   Depression    takes Effexor  daily and Risperdal nightly   Dizziness    H/O blood clots 01/03/2010   in leg   Headache    occasionally   Hyperlipidemia    Hypertension    takes Losartan and Coreg daily   Muscle spasm    takes Zizanidine daily as needed   Shortness of breath dyspnea    more with exertion but sometimes notices with lying/sitting   Sleep apnea    Substance abuse (HCC)    Weakness    tingling and numbness   Current Outpatient Medications  Medication Sig Dispense Refill   acetaminophen (TYLENOL) 500 MG tablet Take 2 tablets (1,000 mg total) by mouth every 6 (six) hours as needed. 30 tablet 0   carvedilol (COREG) 6.25 MG tablet Take 1 tablet (6.25 mg total) by mouth 2 (two) times daily with a meal. 180 tablet 3   dapagliflozin propanediol (FARXIGA) 10 MG  TABS tablet Take 1 tablet (10 mg total) by mouth daily before breakfast. 90 tablet 3   furosemide (LASIX) 20 MG tablet Take 1 tablet (20 mg total) by mouth daily.     lidocaine 4 % Place 1 patch onto the skin daily.     losartan (COZAAR) 100 MG tablet Take 1 tablet (100 mg total) by mouth daily.     methocarbamol (ROBAXIN) 500 MG tablet Take 1 tablet (500 mg total) by mouth 2 (two) times daily. 20 tablet 0   spironolactone (ALDACTONE) 25 MG tablet Take 1 tablet (25 mg total) by mouth daily. 90 tablet 3   pregabalin (LYRICA) 100 MG capsule Take 100 mg by mouth 3 (three) times daily. (Patient not taking: Reported on 11/29/2022)     No current facility-administered medications for this encounter.   Social History   Tobacco Use   Smoking status: Every Day    Current packs/day: 0.25    Average packs/day: 0.3 packs/day for 20.0 years (5.0 ttl pk-yrs)    Types: Cigarettes   Smokeless tobacco: Never   Tobacco comments:    1-2 cigs per day (08/15/14)  Vaping Use   Vaping status: Every Day  Substance Use Topics   Alcohol use: No    Alcohol/week: 0.0 standard drinks of alcohol    Comment: stopped in april 2014   Drug use: Not Currently    Types: Cocaine   FHx: - Mom died (30s) in MVA when he was 72 months old - Dad murdered (65yo) when he was in 7th grade  - No FHx of HF  BP (!) 140/80   Pulse (!) 54   Wt 84 kg (185 lb 3.2 oz)   SpO2 98%   BMI 24.43 kg/m   Wt Readings from Last 3 Encounters:  11/29/22 84 kg (185 lb 3.2 oz)  11/24/22 83 kg (183 lb)  11/11/22 83.5 kg (184 lb)   PHYSICAL EXAM: General:  Well appearing. No resp difficulty HEENT: normal Neck: supple. no JVD. Carotids 2+ bilat; no bruits. No lymphadenopathy or thryomegaly appreciated. Cor: PMI nondisplaced. Regular rate & rhythm. No rubs, gallops or murmurs. Lungs: clear Abdomen: soft, nontender, nondistended. No hepatosplenomegaly. No bruits or masses. Good bowel sounds. Extremities: no cyanosis, clubbing, rash,  edema Neuro: alert & orientedx3, cranial nerves grossly intact. moves all 4 extremities w/o difficulty. Affect pleasant   ReDs: 35%  ASSESSMENT & PLAN: 1. Chronic systolic HF/NICM:  - Cath 11/14 normal cors - Echo (2/15): EF ~35-40%  - Echo (9/21): EF 20-25% RV mildly HK in setting of severe HTN - Echo (7/22):  EF 40-45%, RV ok. - Lost to follow-up and stopped several meds. - Echo (4/24): EF 30-35%  - NYHA I-II, volume ok, REDs 35% - Start Farxiga 10 mg daily. - Start BiDil 0.5 tab tid - Decrease Lasix to 20 mg daily, may be able to stop next visit. - Continue losartan to 100 mg daily (no Entresto due to angiodema with ACEi). - Continue carvedilol 6.25 mg bid. Will not increase with HR 55  - Continue spironolactone 25 mg daily  - Labs today. - Repeat echo in 3 months now that he is back on GDMT  2. HTN - BP uncontrolled - Add BiDil as above - Given Rx for BP cuff for home checks - Notify clinic if SBP >140  3. Polysubstance abuse - At [facility]. - No cocaine use in years, drinks occasional beer. - Remains abstinent, congratulated on sobriety   4. Current smoker - Smokes 1/2 ppd  - Encouraged cessation. Does not appear ready to quit  5. OSA  - Noncompliant w/ CPAP, he lost the machine in a move - Referred for another sleep study - May be contributing to hypertension  6. Left Medical Meniscus Tear  - awaiting surgery, pending cardiac clearance. He is now undecided if he wants to proceed with surgery - Will need better BP control if he decided to proceed with surgery. He has no h/o CAD. Prior caths w/o disease and no ischemic CP symptoms. He is able to achieve 4 METS. Repeat echo with mildly lower EF. Ideally would await repeat echo after being back on GDMT until undergoing elective surgery.  Arvilla Meres, MD  12:53 PM

## 2022-11-29 NOTE — ED Provider Notes (Signed)
[facility]    CSN: [id number] Arrival date & time: 11/29/22  4098      History   Chief Complaint Chief Complaint  Patient presents with   Back Pain    HPI Charles Cervantes is a 62 y.o. male.  Left middle back pain x 4 weeks Initially began after lifting a dresser Seen here on 10/31 and 11/21 for same Today rating 10/10 pain He denies new injury, trauma, or falls No extremity weakness or paresthesias   Tried flexeril and lidocaine patches Was sent robaxin but did not pick it up  He also went to [facility] on 11/22 and was prescribed Lyrica. He did not pick up the medication yet   Has a cardiology appointment today to discuss BP Reports took his medicine this morning   Past Medical History:  Diagnosis Date   Anxiety    CHF (congestive heart failure) (HCC)    takes Furosemide daily   Chronic back pain    Constipation    takes Colace daily   COPD (chronic obstructive pulmonary disease) (HCC)    Spiriva daily   Depression    takes Effexor daily and Risperdal nightly   Dizziness    H/O blood clots 01/03/2010   in leg   Headache    occasionally   Hyperlipidemia    Hypertension    takes Losartan and Coreg daily   Muscle spasm    takes Zizanidine daily as needed   Shortness of breath dyspnea    more with exertion but sometimes notices with lying/sitting   Sleep apnea    Substance abuse (HCC)    Weakness    tingling and numbness    Patient Active Problem List   Diagnosis Date Noted   Pain in left knee 04/26/2021   Cubital tunnel syndrome on right 08/25/2020   Sleep concern 08/25/2020   Foot pain, left 06/16/2020   Impingement syndrome of left shoulder 06/16/2020   Encounter for hepatitis C virus screening test for high risk patient 06/16/2020   NICM (nonischemic cardiomyopathy) (HCC) 12/04/2015   Lumbar back pain 09/03/2014   Spinal stenosis in cervical region 04/21/2014   PTSD (post-traumatic stress disorder) 11/04/2013   HTN (hypertension)  01/01/2013   Tobacco use disorder 09/05/2012   Chronic systolic heart failure (HCC) 05/08/2012   Alcohol abuse 05/01/2012   Cocaine abuse (HCC) 05/01/2012    Past Surgical History:  Procedure Laterality Date   LEFT AND RIGHT HEART CATHETERIZATION WITH CORONARY ANGIOGRAM N/A 11/20/2012   Procedure: LEFT AND RIGHT HEART CATHETERIZATION WITH CORONARY ANGIOGRAM;  Surgeon: Dolores Patty, MD;  Location[facility] CATH LAB;  Service: Cardiovascular;  Laterality: N/A;   Left Second finger surgery     LUMBAR FUSION  09/05/2014   L5 S1       Home Medications    Prior to Admission medications   Medication Sig Start Date End Date Taking? Authorizing Provider  carvedilol (COREG) 6.25 MG tablet Take 1 tablet (6.25 mg total) by mouth 2 (two) times daily with a meal. 11/11/22  Yes Bensimhon, Bevelyn Buckles, MD  dapagliflozin propanediol (FARXIGA) 10 MG TABS tablet Take 1 tablet (10 mg total) by mouth daily before breakfast. 11/11/22  Yes Bensimhon, Bevelyn Buckles, MD  furosemide (LASIX) 20 MG tablet Take 1 tablet (20 mg total) by mouth daily. 11/11/22  Yes Bensimhon, Bevelyn Buckles, MD  losartan (COZAAR) 100 MG tablet Take 1 tablet (100 mg total) by mouth daily. 11/11/22  Yes Bensimhon, Bevelyn Buckles, MD  methocarbamol (  ROBAXIN) 500 MG tablet Take 1 tablet (500 mg total) by mouth 2 (two) times daily. 11/29/22  Yes Jayzen Paver, Lurena Joiner, PA-C  pregabalin (LYRICA) 100 MG capsule Take 100 mg by mouth 3 (three) times daily.   Yes [provider]  spironolactone (ALDACTONE) 25 MG tablet Take 1 tablet (25 mg total) by mouth daily. 11/11/22  Yes Bensimhon, Bevelyn Buckles, MD  acetaminophen (TYLENOL) 500 MG tablet Take 2 tablets (1,000 mg total) by mouth every 6 (six) hours as needed. 09/28/20   Cathleen Corti, MD    Family History Family History  Problem Relation Age of Onset   Prostate cancer Paternal Uncle    Colon cancer Neg Hx    Colon polyps Neg Hx    Esophageal cancer Neg Hx    Rectal cancer Neg Hx    Stomach cancer  Neg Hx     Social History Social History   Tobacco Use   Smoking status: Every Day    Current packs/day: 0.25    Average packs/day: 0.3 packs/day for 20.0 years (5.0 ttl pk-yrs)    Types: Cigarettes   Smokeless tobacco: Never   Tobacco comments:    1-2 cigs per day (08/15/14)  Vaping Use   Vaping status: Every Day  Substance Use Topics   Alcohol use: No    Alcohol/week: 0.0 standard drinks of alcohol    Comment: stopped in april 2014   Drug use: Not Currently    Types: Cocaine     Allergies   Ace inhibitors   Review of Systems Review of Systems  Musculoskeletal:  Positive for back pain.   Per HPI  Physical Exam Triage Vital Signs ED Triage Vitals [11/29/22 0917]  Encounter Vitals Group     BP (!) 167/109     Systolic BP Percentile      Diastolic BP Percentile      Pulse Rate (!) 56     Resp 16     Temp 97.8 F (36.6 C)     Temp Source Oral     SpO2 97 %     Weight      Height      Head Circumference      Peak Flow      Pain Score 10     Pain Loc      Pain Education      Exclude from Growth Chart    No data found.  Updated Vital Signs BP (!) 166/100 (BP Location: Right Arm) Comment: medium cuff  Pulse (!) 56   Temp 97.8 F (36.6 C) (Oral)   Resp 16   SpO2 97%   Physical Exam Vitals and nursing note reviewed.  Constitutional:      General: He is not in acute distress. HENT:     Mouth/Throat:     Mouth: Mucous membranes are moist.     Pharynx: Oropharynx is clear.  Eyes:     Extraocular Movements: Extraocular movements intact.     Conjunctiva/sclera: Conjunctivae normal.     Pupils: Pupils are equal, round, and reactive to light.  Cardiovascular:     Rate and Rhythm: Normal rate and regular rhythm.     Pulses: Normal pulses.          Radial pulses are 2+ on the right side and 2+ on the left side.     Heart sounds: Normal heart sounds.  Pulmonary:     Effort: Pulmonary effort is normal.     Breath sounds: Normal breath sounds.  Musculoskeletal:        General: Normal range of motion.     Cervical back: Normal range of motion. No rigidity. Normal range of motion.     Thoracic back: Tenderness present. No bony tenderness.       Back:     Comments: Muscle tenderness left thoracic back. No spinal tenderness.   Skin:    General: Skin is warm and dry.     Comments: No rash; no erythema, bruising, or lesions  Neurological:     General: No focal deficit present.     Mental Status: He is alert and oriented to person, place, and time.     Cranial Nerves: Cranial nerves 2-12 are intact. No cranial nerve deficit.     Sensory: Sensation is intact.     Motor: Motor function is intact. No weakness.     Coordination: Coordination is intact.     Gait: Gait is intact.     Deep Tendon Reflexes: Reflexes are normal and symmetric.     Comments: Strength 5/5. Sensation intact throughout. Full ROM of extremities.       UC Treatments / Results  Labs (all labs ordered are listed, but only abnormal results are displayed) Labs Reviewed - No data to display  EKG   Radiology No results found.  Procedures Procedures (including critical care time)  Medications Ordered in UC Medications - No data to display  Initial Impression / Assessment and Plan / UC Course  I have reviewed the triage vital signs and the nursing notes.  Pertinent labs & imaging results that were available during my care of the patient were reviewed by me and considered in my medical decision making (see chart for details).  Muscular back tenderness No red flags at this time Has been prescribed Lyrica and robaxin but have not picked up yet Recommend begin these medications  A lidocaine patch is applied to patient in clinic  Advised close follow up with orthopedics if symptoms are still persisting. Provided two clinic options. Discussed limitations of urgent care  BP elevated 166/100 Took his medications this morning, has cardiology follow up  today Not having headache, dizziness, vision changes, chest pain, shortness of breath, abdominal pain Had CT angio chest 5 days ago that was normal   Final Clinical Impressions(s) / UC Diagnoses   Final diagnoses:  Acute left-sided thoracic back pain     Discharge Instructions      Please pick up the Lyrica that was prescribed at your [facility] visit  You can take the muscle relaxer (Robaxin) twice daily. If the medication makes you drowsy, take only at bed time.  Please contact the orthopedic clinic listed below to make an appointment. You will need to follow with a specialist since your pain is persisting. You can also go to the walk-in orthopedic urgent care [facility])     ED Prescriptions     Medication Sig Dispense Auth. Provider   methocarbamol (ROBAXIN) 500 MG tablet Take 1 tablet (500 mg total) by mouth 2 (two) times daily. 20 tablet Vetta Couzens, Lurena Joiner, PA-C      PDMP not reviewed this encounter.   Taneika Choi, Lurena Joiner, PA-C 11/29/22 1027

## 2022-11-29 NOTE — ED Triage Notes (Signed)
Pt presents for a follow-up on his lower back pain in the center of his back. Pt states this is the 3rd time he has been seen here for his back pain. Pt states he is out of muscle relaxer and patches.

## 2022-12-01 LAB — ECHOCARDIOGRAM COMPLETE
AR max vel: 2.26 cm2
AV Area VTI: 2.44 cm2
AV Area mean vel: 2.37 cm2
AV Mean grad: 3.6 mm[Hg]
AV Peak grad: 8.5 mm[Hg]
Ao pk vel: 1.46 m/s
Area-P 1/2: 3.12 cm2
Calc EF: 36.8 %
Est EF: 25
S' Lateral: 5.7 cm
Single Plane A2C EF: 44.5 %
Single Plane A4C EF: 26.2 %

## 2022-12-04 ENCOUNTER — Telehealth (HOSPITAL_COMMUNITY): Payer: Self-pay | Admitting: Medical

## 2022-12-04 ENCOUNTER — Telehealth: Payer: Self-pay

## 2022-12-04 ENCOUNTER — Encounter (HOSPITAL_COMMUNITY): Payer: Self-pay

## 2022-12-04 ENCOUNTER — Emergency Department (HOSPITAL_COMMUNITY)
Admission: EM | Admit: 2022-12-04 | Discharge: 2022-12-04 | Disposition: A | Discharge status: Home / Self Care | Payer: Medicaid Other | Attending: Emergency Medicine | Admitting: Emergency Medicine

## 2022-12-04 ENCOUNTER — Other Ambulatory Visit: Payer: Self-pay

## 2022-12-04 ENCOUNTER — Emergency Department (HOSPITAL_COMMUNITY): Payer: Medicaid Other

## 2022-12-04 DIAGNOSIS — R0781 Pleurodynia: Secondary | ICD-10-CM | POA: Insufficient documentation

## 2022-12-04 DIAGNOSIS — S82842A Displaced bimalleolar fracture of left lower leg, initial encounter for closed fracture: Secondary | ICD-10-CM | POA: Diagnosis not present

## 2022-12-04 DIAGNOSIS — F1410 Cocaine abuse, uncomplicated: Secondary | ICD-10-CM | POA: Insufficient documentation

## 2022-12-04 DIAGNOSIS — I509 Heart failure, unspecified: Secondary | ICD-10-CM | POA: Insufficient documentation

## 2022-12-04 DIAGNOSIS — M79641 Pain in right hand: Secondary | ICD-10-CM | POA: Insufficient documentation

## 2022-12-04 DIAGNOSIS — M79605 Pain in left leg: Secondary | ICD-10-CM | POA: Diagnosis present

## 2022-12-04 DIAGNOSIS — W19XXXA Unspecified fall, initial encounter: Secondary | ICD-10-CM | POA: Diagnosis not present

## 2022-12-04 DIAGNOSIS — M25562 Pain in left knee: Secondary | ICD-10-CM | POA: Insufficient documentation

## 2022-12-04 LAB — CBC WITH DIFFERENTIAL/PLATELET
Abs Immature Granulocytes: 0.03 10*3/uL (ref 0.00–0.07)
Basophils Absolute: 0.1 10*3/uL (ref 0.0–0.1)
Basophils Relative: 1 %
Eosinophils Absolute: 0.2 10*3/uL (ref 0.0–0.5)
Eosinophils Relative: 4 %
HCT: 46.8 % (ref 39.0–52.0)
Hemoglobin: 14.9 g/dL (ref 13.0–17.0)
Immature Granulocytes: 0 %
Lymphocytes Relative: 19 %
Lymphs Abs: 1.3 10*3/uL (ref 0.7–4.0)
MCH: 29.1 pg (ref 26.0–34.0)
MCHC: 31.8 g/dL (ref 30.0–36.0)
MCV: 91.4 fL (ref 80.0–100.0)
Monocytes Absolute: 0.7 10*3/uL (ref 0.1–1.0)
Monocytes Relative: 10 %
Neutro Abs: 4.5 10*3/uL (ref 1.7–7.7)
Neutrophils Relative %: 66 %
Platelets: 231 10*3/uL (ref 150–400)
RBC: 5.12 MIL/uL (ref 4.22–5.81)
RDW: 13.1 % (ref 11.5–15.5)
WBC: 6.9 10*3/uL (ref 4.0–10.5)
nRBC: 0 % (ref 0.0–0.2)

## 2022-12-04 LAB — COMPREHENSIVE METABOLIC PANEL
ALT: 25 U/L (ref 0–44)
AST: 29 U/L (ref 15–41)
Albumin: 3.8 g/dL (ref 3.5–5.0)
Alkaline Phosphatase: 58 U/L (ref 38–126)
Anion gap: 8 (ref 5–15)
BUN: 15 mg/dL (ref 8–23)
CO2: 22 mmol/L (ref 22–32)
Calcium: 8.6 mg/dL — ABNORMAL LOW (ref 8.9–10.3)
Chloride: 106 mmol/L (ref 98–111)
Creatinine, Ser: 1.35 mg/dL — ABNORMAL HIGH (ref 0.61–1.24)
GFR, Estimated: 60 mL/min — ABNORMAL LOW (ref >60)
Glucose, Bld: 105 mg/dL — ABNORMAL HIGH (ref 70–99)
Potassium: 4.4 mmol/L (ref 3.5–5.1)
Sodium: 136 mmol/L (ref 135–145)
Total Bilirubin: 0.5 mg/dL (ref <1.2)
Total Protein: 6.7 g/dL (ref 6.5–8.1)

## 2022-12-04 MED ORDER — OXYCODONE-ACETAMINOPHEN 5-325 MG PO TABS
1.0000 | ORAL_TABLET | Freq: Once | ORAL | Status: AC
  Administered 2022-12-04: 1 via ORAL
  Filled 2022-12-04: qty 1

## 2022-12-04 MED ORDER — NALOXONE HCL 4 MG/0.1ML NA LIQD: 1.0000 | Freq: Once | NASAL | Status: DC

## 2022-12-04 MED ORDER — TETANUS-DIPHTH-ACELL PERTUSSIS 5-2.5-18.5 LF-MCG/0.5 IM SUSY: 0.5000 mL | PREFILLED_SYRINGE | Freq: Once | INTRAMUSCULAR | Status: DC

## 2022-12-04 MED ORDER — LIDOCAINE 5 % EX PTCH
1.0000 | MEDICATED_PATCH | CUTANEOUS | Status: DC
  Administered 2022-12-04: 1 via TRANSDERMAL
  Filled 2022-12-04: qty 1

## 2022-12-04 MED ORDER — HYDROCODONE-ACETAMINOPHEN 5-325 MG PO TABS
1.0000 | ORAL_TABLET | Freq: Once | ORAL | Status: AC
  Administered 2022-12-04: 1 via ORAL
  Filled 2022-12-04: qty 1

## 2022-12-04 MED ORDER — OXYCODONE-ACETAMINOPHEN 5-325 MG PO TABS: 1.0000 | ORAL_TABLET | Freq: Four times a day (QID) | ORAL | 0 refills | Status: DC | PRN

## 2022-12-04 NOTE — Discharge Instructions (Addendum)
You have an ankle fracture, I have prescribed you some pain medication, to take for severe pain.  Otherwise you can take it ibuprofen, to help with your pain.  It is very important that you follow-up with the orthopedic doctor, and do not put weight on your ankle.  You have 2 ankle fractures, and it will get worse, if you put any weight on it.  You declined his tetanus, please follow-up with your PCP.  I believe that you have is just a rib contusion, there is no evidence of rib fractures, your x-ray and your exam is reassuring.  Do not take the Percocets with alcohol, or any other recreational drugs, as it may make you more sleepy, will cause you to have difficulty breathing.  If you have severe difficult time breathing, your foot turns blue or cold to the touch, please return to the ER immediately.  I have also discharged you with an incentive spirometer, please use this as needed, to help prevent pneumonia given your rib bruise

## 2022-12-04 NOTE — ED Notes (Signed)
Ortho tech at bedside 
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
## 2022-12-04 NOTE — Telephone Encounter (Signed)
Patient called to say the pharmacy his medication was sent to is closed. Messaged provider Small PA to change it to requested [facility] on [address]. She will proceed with this
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
## 2022-12-04 NOTE — Progress Notes (Signed)
Orthopedic Tech Progress Note Patient Details:  Charles Cervantes 1960-09-25 [id number]  Ortho Devices Type of Ortho Device: Stirrup splint, Post (short leg) splint, Crutches Ortho Device/Splint Location: LLE Ortho Device/Splint Interventions: Ordered, Application, Adjustment   Post Interventions Patient Tolerated: Well Instructions Provided: Adjustment of device, Poper ambulation with device, Care of device  [name] 12/04/2022, 12:03 PM

## 2022-12-04 NOTE — Telephone Encounter (Cosign Needed)
Resent med to [facility] on [address]. Called pharmacy to cancel other narcotic.

## 2022-12-05 ENCOUNTER — Ambulatory Visit (INDEPENDENT_AMBULATORY_CARE_PROVIDER_SITE_OTHER): Payer: Medicaid Other | Admitting: Orthopedic Surgery

## 2022-12-05 ENCOUNTER — Other Ambulatory Visit (INDEPENDENT_AMBULATORY_CARE_PROVIDER_SITE_OTHER): Payer: Medicaid Other

## 2022-12-05 DIAGNOSIS — S82892A Other fracture of left lower leg, initial encounter for closed fracture: Secondary | ICD-10-CM
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
## 2022-12-15 ENCOUNTER — Ambulatory Visit: Payer: Medicaid Other | Admitting: Orthopedic Surgery

## 2022-12-15 ENCOUNTER — Other Ambulatory Visit (INDEPENDENT_AMBULATORY_CARE_PROVIDER_SITE_OTHER): Payer: Medicaid Other

## 2022-12-15 ENCOUNTER — Encounter: Payer: Self-pay | Admitting: Orthopedic Surgery

## 2022-12-15 DIAGNOSIS — S82892A Other fracture of left lower leg, initial encounter for closed fracture: Secondary | ICD-10-CM

## 2022-12-15 NOTE — Progress Notes (Signed)
Orthopedic Surgery Progress Note     Assessment: Patient is a 62 y.o. male with left ankle fracture     Plan: -Alignment has been maintained so we will continue with nonoperative treatment -Provided her with a prescription for a knee walker to help him with mobilization given his nonweightbearing status -Tentatively will plan to transition him to a boot at our next visit -Maintain short leg cast, keep dry at all times -NWB LLE Pain control: OTC medications -Follow up in 3 to 4 weeks for repeat x-rays out of cast   ___________________________________________________________________________   History: Patient is been doing well since he was last seen.  His pain has decreased in the ankle.  He has some throbbing pain at the end of the day but it gets better if he elevates the leg.  He is taking over-the-counter medications for pain.  He is asking if there is anything that could help him with getting around.     Physical Exam:   General: no acute distress, appears stated age Neurologic: alert, answering questions appropriately, following commands Respiratory: unlabored breathing on room air, symmetric chest rise Psychiatric: appropriate affect, normal cadence to speech   MSK:    -Left lower extremity             Cast in place EHL/TA/GSC intact Plantarflexes and dorsiflexes toes Sensation intact to light touch over dorsal and plantar aspect of toes Foot warm and well perfused   Imaging:  XRs of the left ankle from 12/15/2022 were independently reviewed and interpreted, showing minimally displaced medial malleolus fracture.  No change in alignment since last set of films.  The subtle lucency of the distal fibula is no longer well-visualized with the overlying cast material.  No new fracture seen.  No dislocation seen.     Patient name: Charles Cervantes Patient MRN: [id number] Date: 12/15/22

## 2023-01-02 ENCOUNTER — Telehealth: Payer: Self-pay

## 2023-01-02 NOTE — Telephone Encounter (Signed)
-----   Message from [facility] sent at 12/14/2022 10:42 AM EST ----- Scheduled 1/30 ----- Message ----- From: Suezanne Cheshire, RN Sent: 11/30/2022  11:11 AM EST To: Modesta Messing, CMA   ----- Message ----- From: Modesta Messing, CMA Sent: 11/30/2022  10:01 AM EST To: Suezanne Cheshire, RN  Who's the patient? ----- Message ----- From: Suezanne Cheshire, RN Sent: 11/29/2022   1:06 PM EST To: Modesta Messing, CMA   Test: cardiac MRI  Insurance: Medicaid Healthy  CPT:   Location: [facility]  Dx: heart failure  Provider: Dr. Gala Romney  Scheduled Date:

## 2023-01-11 ENCOUNTER — Ambulatory Visit: Payer: Medicaid Other | Admitting: Orthopedic Surgery

## 2023-01-11 ENCOUNTER — Other Ambulatory Visit (INDEPENDENT_AMBULATORY_CARE_PROVIDER_SITE_OTHER): Payer: Self-pay

## 2023-01-11 DIAGNOSIS — S82892A Other fracture of left lower leg, initial encounter for closed fracture: Secondary | ICD-10-CM | POA: Diagnosis not present

## 2023-01-11 NOTE — Progress Notes (Signed)
 Orthopedic Surgery Progress Note     Assessment: Patient is a 63 y.o. male with left ankle fracture     Plan: -Patient has been bearing weight with his cast on and alignment has not changed so will continue with non-operative management -Transition him to a boot at this visit. Since he is already weight bearing, will continue him as weight bearing as tolerated in the boot Pain control: OTC medications -Follow up in 4 weeks, x-rays at next visit: AP/lateral/mortise left ankle out of boot   ___________________________________________________________________________   Subjective: Patient has been doing well since he was last seen.  He is not having any significant pain in his ankle.  He is not taking any medications for pain.  He has not developed any new symptoms since he was last seen.     Physical Exam:   General: no acute distress, appears stated age Neurologic: alert, answering questions appropriately, following commands Respiratory: unlabored breathing on room air, symmetric chest rise Psychiatric: appropriate affect, normal cadence to speech   MSK:    -Left lower extremity             Minimally tender to palpation over the medial malleolus, no other tenderness to palpation over the remainder of the ankle  No gross deformity seen, no open wounds, no ulcer was seen EHL/TA/GSC intact Plantarflexes and dorsiflexes toes Sensation intact to light touch over dorsal and plantar aspect of toes Foot warm and well perfused   Imaging:  XRs of the left ankle from 01/11/2023 were independently reviewed and interpreted, showing minimally displaced medial malleolus fracture.  No significant change in alignment since last x-rays were obtained on 12/15/2022.  No fibula fracture seen.  No new fracture seen.  No dislocation observed.  No callus formation seen.    Patient name: Charles Cervantes Patient MRN: [id number] Date: 01/11/23

## 2023-02-01 ENCOUNTER — Telehealth (HOSPITAL_COMMUNITY): Payer: Self-pay | Admitting: *Deleted

## 2023-02-01 NOTE — Telephone Encounter (Signed)
Reaching out to patient to offer assistance regarding upcoming cardiac imaging study; pt verbalizes understanding of appt date/time, parking situation and where to check in, pre-test NPO status and medications ordered, and verified current allergies; name and call back number provided for further questions should they arise Johney Frame RN Navigator Cardiac Imaging [facility] [phone] office [phone] cell  Patient denies metal or claustrophobia.

## 2023-02-01 NOTE — Telephone Encounter (Signed)
Attempted to call patient regarding upcoming cardiac MRI appointment. Unable to leave VM. Johney Frame RN Navigator Cardiac Imaging [facility] [phone] Office

## 2023-02-02 ENCOUNTER — Other Ambulatory Visit (HOSPITAL_COMMUNITY): Payer: Self-pay | Admitting: Internal Medicine

## 2023-02-02 ENCOUNTER — Ambulatory Visit (HOSPITAL_COMMUNITY)
Admission: RE | Admit: 2023-02-02 | Discharge: 2023-02-02 | Disposition: A | Discharge status: Home / Self Care | Payer: Medicaid Other | Source: Ambulatory Visit | Attending: Internal Medicine | Admitting: Internal Medicine

## 2023-02-02 DIAGNOSIS — I5022 Chronic systolic (congestive) heart failure: Secondary | ICD-10-CM | POA: Diagnosis present

## 2023-02-02 MED ORDER — GADOBUTROL 1 MMOL/ML IV SOLN
10.0000 mL | Freq: Once | INTRAVENOUS | Status: AC | PRN
  Administered 2023-02-02: 10 mL via INTRAVENOUS

## 2023-02-28 ENCOUNTER — Telehealth (HOSPITAL_COMMUNITY): Payer: Self-pay

## 2023-02-28 NOTE — Telephone Encounter (Signed)
 Called to confirm/remind patient of their appointment [facility] on 03/01/23. However,was unable to leave patient a voice message.

## 2023-03-01 ENCOUNTER — Encounter (HOSPITAL_COMMUNITY): Payer: Medicaid Other

## 2023-03-01 NOTE — Progress Notes (Incomplete)
ADVANCED HF CLINIC NOTE  Patient ID: Charles Cervantes, male   DOB: 1960-06-17, 63 y.o.   MRN: [id number]  PCP: Charles Quale, MD HF Cardiologist: Dr. Gala Cervantes  HPI: Charles Cervantes is a 63 y.o. male with systolic heart failure diagnosed in 2014 in [place] secondary to NICM (normal cors on cath), EF 20-25%. Other history includes polysubstance abuse (cocaine, tobacco and alcohol).  He moved from [facility] and was admitted to [facility] in 4/14 with a/c CHF.  Echo showed EF 20-25% with normal RV.  Discharge weight 147 pounds.  Admitted To [facility] in 8/14 with HF. EF reported 50-55% on echo. 10/2012 bedside echo in our HF Clinic: EF 35-40%. In 11/2012 presented to clinic c/o recurrent CP and severe HF symptoms but seemed well compensated on exam R/L cath showed normal cors.   He was lost to f/u in 2016.   Readmitted to [facility] 9/14-17/21 with severe HTN and recurrent CP anfd HF. Reported he was taking his HF meds except his fluid pill. BP on arrival 163/118. Diuresed and BP controlled.   Echo 09/18/19 EF 20-25% RV mildly HK   Post hospital f/u 9/21 enrolled back in HF Clinic (previously discharged due to inappropriate behavior towards staff).  Echo 7/22 EF had improved to 40-45%, RV normal. Trival Charles. No AS.   Been in two treatment centers in [place] at [facility] 2/9-03/11/20. Then relapsed and went to [facility] 3/22.   Follow up 3/24 for surgical clearance prior to anticipated knee surgery for left medial meniscal tear repair.  Off several of his meds including Losartan, Lasix and Farxiga. He was volume overloaded. Meds restarted. Echo ordered.   Seen as urgent nurse visit on 04/22/22. Reported increased dyspnea. Had not been taking losartan, spiro or lasix. ReDS 47%. Meds restarted.   Echo (4/24) showed EF 30-35%, mild LVH, RV ok  Today he returns for HF follow up. Says he is doing well from a heart standpoint. Main issue is back spasms. Recently BP has been up but had been out of his meds. Can do ADLs without  problem except for back pain.  No CP, edema, orthopnea or PND  Echo today 11/29/22  EF 30-35% with prominent LV trabeculations. RV mildly HK Personally reviewed   Cardiac Studies - Echo (4/24): EF 30-35%   - Echo (7/22): EF 40-45%  - Echo (9/21): EF 20-25% RV mildly HK in setting of severe HTN  - Echo (2/15): EF 35-40%  - CPX (2/15) FVC 2.90 (62%)  FEV1 2.22 (60%) FEV1/FVC 76%  Resting HR: 61 Peak HR: 111 (66% age predicted max HR) BP rest: 146/90 BP peak: 168/86 Peak VO2: 22.6 (64.1% predicted peak VO2) VE/VCO2 slope: 29.4 OUES: 2.49 Peak RER: 0.90  - RHC/LHC  (11/14) RA = 6  RV = 33/3/8  PA = 29/9 (18)  PCW = 8  Fick cardiac output/index = 6.4/3.2  PVR = 1.5 WU  FA sat = 95%  PA sat = 72%, 73%  SVC sat 72% Extensive calcification in proximal and mid LAD with only mild intraluminal stenosis with 20-30% stenosis.   - Echo (8/14): EF 50-55% at [facility]  - Echo (7/14): EF 30-35%  - Echo (4/14): EF 20-25%  ROS: All systems negative except as listed in HPI, PMH and Problem List.  Past Medical History:  Diagnosis Date   Anxiety    CHF (congestive heart failure) (HCC)    takes Furosemide daily   Chronic back pain    Constipation    takes  Colace daily   COPD (chronic obstructive pulmonary disease) (HCC)    Spiriva daily   Depression    takes Effexor daily and Risperdal nightly   Dizziness    H/O blood clots 01/03/2010   in leg   Headache    occasionally   Hyperlipidemia    Hypertension    takes Losartan and Coreg daily   Muscle spasm    takes Zizanidine daily as needed   Shortness of breath dyspnea    more with exertion but sometimes notices with lying/sitting   Sleep apnea    Substance abuse (HCC)    Weakness    tingling and numbness   Current Outpatient Medications  Medication Sig Dispense Refill   acetaminophen (TYLENOL) 500 MG tablet Take 2 tablets (1,000 mg total) by mouth every 6 (six) hours as needed. 30 tablet 0   carvedilol (COREG) 6.25 MG  tablet Take 1 tablet (6.25 mg total) by mouth 2 (two) times daily with a meal. 180 tablet 3   dapagliflozin propanediol (FARXIGA) 10 MG TABS tablet Take 1 tablet (10 mg total) by mouth daily before breakfast. 90 tablet 3   furosemide (LASIX) 20 MG tablet Take 1 tablet (20 mg total) by mouth daily.     lidocaine 4 % Place 1 patch onto the skin daily.     losartan (COZAAR) 100 MG tablet Take 1 tablet (100 mg total) by mouth daily.     methocarbamol (ROBAXIN) 500 MG tablet Take 1 tablet (500 mg total) by mouth 2 (two) times daily. 20 tablet 0   oxyCODONE-acetaminophen (PERCOCET/ROXICET) 5-325 MG tablet Take 1 tablet by mouth every 6 (six) hours as needed for severe pain (pain score 7-10). 10 tablet 0   pregabalin (LYRICA) 100 MG capsule Take 100 mg by mouth 3 (three) times daily.     spironolactone (ALDACTONE) 25 MG tablet Take 1 tablet (25 mg total) by mouth daily. 90 tablet 3   No current facility-administered medications for this visit.   Social History   Tobacco Use   Smoking status: Every Day    Current packs/day: 0.25    Average packs/day: 0.3 packs/day for 20.0 years (5.0 ttl pk-yrs)    Types: Cigarettes   Smokeless tobacco: Never   Tobacco comments:    1-2 cigs per day (08/15/14)  Vaping Use   Vaping status: Every Day  Substance Use Topics   Alcohol use: No    Alcohol/week: 0.0 standard drinks of alcohol    Comment: stopped in april 2014   Drug use: Not Currently    Types: Cocaine   FHx: - Mom died (30s) in MVA when he was 55 months old - Dad murdered (65yo) when he was in 7th grade  - No FHx of HF  There were no vitals taken for this visit.  Wt Readings from Last 3 Encounters:  12/04/22 84 kg (185 lb 3 oz)  11/29/22 84 kg (185 lb 3.2 oz)  11/24/22 83 kg (183 lb)   PHYSICAL EXAM: General:  Well appearing. No resp difficulty HEENT: normal Neck: supple. no JVD. Carotids 2+ bilat; no bruits. No lymphadenopathy or thryomegaly appreciated. Cor: PMI nondisplaced. Regular  rate & rhythm. No rubs, gallops or murmurs. Lungs: clear Abdomen: soft, nontender, nondistended. No hepatosplenomegaly. No bruits or masses. Good bowel sounds. Extremities: no cyanosis, clubbing, rash, edema Neuro: alert & orientedx3, cranial nerves grossly intact. moves all 4 extremities w/o difficulty. Affect pleasant  ASSESSMENT & PLAN: 1. Chronic systolic HF/NICM:  - Cath 11/14 normal  cors - Echo (2/15): EF ~35-40%  - Echo (9/21): EF 20-25% RV mildly HK in setting of severe HTN - Echo (7/22): EF 40-45%, RV ok. - Lost to follow-up and stopped several meds. - Echo (4/24): EF 30-35%  - Echo today 11/29/22  EF 30-35% with prominent LV trabeculations. RV mildly HK Personally reviewed - NYHA I-II from cardiac perspective  - Has been out of HF meds for a bit. Just restarted - Continue Farxiga 10 mg daily. - Continue losartan100 mg daily (no Entresto due to angiodema with ACEi). - Continue carvedilol 6.25 mg bid.  - Continue spironolactone 25 mg daily  - Can restart Bidil if toleating other meds - Check cMRI to look for LVNC - Not candidate for ICD currently with intermittent noncompliance with GDMT  2. HTN - BP elevated - Restarting HF meds as above - Add Bidil as able  3. Polysubstance abuse - At [facility]. - No cocaine use in years, drinks occasional beer. - Remains abstinent  4. Current smoker - Continues to smoke ~1.2ppd - Encouraged cessation as able. Does not appear ready to quit  5. OSA  - Noncompliant w/ CPAP, he lost the machine in a move - Repeat sleeps study ordered in 6/24 but not completed  Jacklynn Ganong, FNP  7:58 AM

## 2023-03-13 ENCOUNTER — Ambulatory Visit: Payer: Medicaid Other | Admitting: Orthopedic Surgery

## 2023-03-17 NOTE — Addendum Note (Signed)
 Encounter addended by: Howell Rucks, RDCS on: 03/17/2023 12:47 PM  Actions taken: Imaging Exam ended

## 2023-03-27 ENCOUNTER — Telehealth (HOSPITAL_COMMUNITY): Payer: Self-pay

## 2023-03-27 NOTE — Telephone Encounter (Signed)
 Called to confirm/remind patient of their appointment at the [facility] on 03/28/23 3pm.   Appointment:   [] Confirmed  [] Left mess   [x] No answer/No voice mail  [] Phone not in service

## 2023-03-28 ENCOUNTER — Encounter (HOSPITAL_COMMUNITY): Payer: Medicaid Other

## 2023-03-28 ENCOUNTER — Telehealth (HOSPITAL_COMMUNITY): Payer: Self-pay | Admitting: Family Medicine

## 2023-03-28 NOTE — Telephone Encounter (Signed)
 Front office called patient to reschedule appointment originally today 03/28/23 to another date per patient.  Patient left voice message on front office telephone that he would like to reschedule his appt. Office called patient, call didn't go through.

## 2023-04-13 ENCOUNTER — Other Ambulatory Visit (HOSPITAL_COMMUNITY): Payer: Self-pay | Admitting: Cardiology

## 2023-04-13 MED ORDER — SPIRONOLACTONE 25 MG PO TABS: 25.0000 mg | ORAL_TABLET | Freq: Every day | ORAL | 3 refills | Status: DC

## 2023-04-17 ENCOUNTER — Telehealth (HOSPITAL_COMMUNITY): Payer: Self-pay

## 2023-04-17 NOTE — Telephone Encounter (Signed)
 Called to confirm/remind patient of their appointment at the [facility] on 04/18/23.   Appointment:   [] Confirmed  [] Left mess   [x] No answer/No voice mail  [] Phone not in service

## 2023-04-18 ENCOUNTER — Telehealth (HOSPITAL_COMMUNITY): Payer: Self-pay | Admitting: Cardiology

## 2023-04-18 ENCOUNTER — Encounter (HOSPITAL_COMMUNITY)

## 2023-04-18 NOTE — Telephone Encounter (Signed)
 Pt has 2 no-show and a cancellation back to back, please advise

## 2023-04-19 ENCOUNTER — Other Ambulatory Visit (HOSPITAL_COMMUNITY): Payer: Self-pay | Admitting: Family Medicine

## 2023-04-19 ENCOUNTER — Telehealth (HOSPITAL_COMMUNITY): Payer: Self-pay | Admitting: Licensed Clinical Social Worker

## 2023-04-19 NOTE — Telephone Encounter (Signed)
 CSW contacted patient due to second no show yesterday. Patient reports that he thought his appointment was tomorrow and missed it. CSW assisted with getting patient rescheduled for tomorrow at 3 pm. Patient denies any concerns with transportation and assured CSW he will make appointment. CSW will be available for any follow up needs. Aubry Blase, LCSW, CCSW-MCS [phone]

## 2023-04-20 ENCOUNTER — Ambulatory Visit (HOSPITAL_COMMUNITY)
Admission: RE | Admit: 2023-04-20 | Discharge: 2023-04-20 | Disposition: A | Discharge status: Home / Self Care | Source: Ambulatory Visit | Attending: Adult Health | Admitting: Adult Health

## 2023-04-20 ENCOUNTER — Encounter (HOSPITAL_COMMUNITY): Payer: Self-pay

## 2023-04-20 VITALS — BP 180/111 | HR 62 | Ht 73.5 in | Wt 185.2 lb

## 2023-04-20 DIAGNOSIS — G4733 Obstructive sleep apnea (adult) (pediatric): Secondary | ICD-10-CM | POA: Diagnosis not present

## 2023-04-20 DIAGNOSIS — Z720 Tobacco use: Secondary | ICD-10-CM

## 2023-04-20 DIAGNOSIS — I428 Other cardiomyopathies: Secondary | ICD-10-CM | POA: Insufficient documentation

## 2023-04-20 DIAGNOSIS — F17210 Nicotine dependence, cigarettes, uncomplicated: Secondary | ICD-10-CM | POA: Insufficient documentation

## 2023-04-20 DIAGNOSIS — I110 Hypertensive heart disease with heart failure: Secondary | ICD-10-CM | POA: Diagnosis not present

## 2023-04-20 DIAGNOSIS — F1911 Other psychoactive substance abuse, in remission: Secondary | ICD-10-CM | POA: Insufficient documentation

## 2023-04-20 DIAGNOSIS — I10 Essential (primary) hypertension: Secondary | ICD-10-CM

## 2023-04-20 DIAGNOSIS — Z5986 Financial insecurity: Secondary | ICD-10-CM | POA: Insufficient documentation

## 2023-04-20 DIAGNOSIS — T50996A Underdosing of other drugs, medicaments and biological substances, initial encounter: Secondary | ICD-10-CM | POA: Diagnosis not present

## 2023-04-20 DIAGNOSIS — Z91120 Patient's intentional underdosing of medication regimen due to financial hardship: Secondary | ICD-10-CM | POA: Diagnosis not present

## 2023-04-20 DIAGNOSIS — I5022 Chronic systolic (congestive) heart failure: Secondary | ICD-10-CM

## 2023-04-20 MED ORDER — SPIRONOLACTONE 25 MG PO TABS: 25.0000 mg | ORAL_TABLET | Freq: Every day | ORAL | 3 refills | Status: AC

## 2023-04-20 MED ORDER — DAPAGLIFLOZIN PROPANEDIOL 10 MG PO TABS: 10.0000 mg | ORAL_TABLET | Freq: Every day | ORAL | 3 refills | Status: AC

## 2023-04-20 MED ORDER — CARVEDILOL 6.25 MG PO TABS: 6.2500 mg | ORAL_TABLET | Freq: Two times a day (BID) | ORAL | 3 refills | Status: AC

## 2023-04-20 MED ORDER — LOSARTAN POTASSIUM 100 MG PO TABS: 100.0000 mg | ORAL_TABLET | Freq: Every day | ORAL | 3 refills | Status: DC

## 2023-04-20 MED ORDER — FUROSEMIDE 20 MG PO TABS: 20.0000 mg | ORAL_TABLET | Freq: Every day | ORAL | 3 refills | Status: DC

## 2023-04-20 NOTE — Addendum Note (Signed)
 Encounter addended by: Denton Flakes, LCSW on: 04/20/2023 4:17 PM  Actions taken: Flowsheet accepted, Clinical Note Signed

## 2023-04-20 NOTE — Progress Notes (Signed)
 ADVANCED HF CLINIC NOTE  Patient ID: Charles Cervantes, male   DOB: Jan 23, 1960, 63 y.o.   MRN: [id number]  PCP: Ivery Quale, MD HF Cardiologist: Dr. Gala Romney  HPI: Charles Cervantes is a 63 y.o. male with systolic heart failure diagnosed in 2014 in [place] secondary to NICM (normal cors on cath), EF 20-25%. Other history includes polysubstance abuse (cocaine, tobacco and alcohol).  He moved from [facility] and was admitted to [facility] in 4/14 with a/c CHF.  Echo showed EF 20-25% with normal RV.  Discharge weight 147 pounds.  Admitted To [facility] in 8/14 with HF. EF reported 50-55% on echo. 10/2012 bedside echo in our HF Clinic: EF 35-40%. In 11/2012 presented to clinic c/o recurrent CP and severe HF symptoms but seemed well compensated on exam R/L cath showed normal cors.   Readmitted to [facility] 9/14-17/21 with severe HTN and recurrent CP anfd HF. Reported he was taking his HF meds except his fluid pill. BP on arrival 163/118. Diuresed and BP controlled.   Echo 09/18/19 EF 20-25% RV mildly HK   Post hospital f/u 9/21 enrolled back in HF Clinic (previously discharged due to inappropriate behavior towards staff).  Echo 7/22 EF had improved to 40-45%, RV normal. Trival Charles. No AS.   Been in two treatment centers in [place] at [facility] 2/9-03/11/20. Then relapsed and went to [facility] 3/22.   Today he returns for HF follow up.Overall feeling fine. Denies SOB/PND/Orthopnea. Appetite ok. No fever or chills. Smoking 10 cigarettes a day.  Out of all meds for the last 3 days. Waiting on his check to pay for the meds. He does not drive. Uses a scooter. He uses public transportation.   Cardiac Studies -CMRI 2025 -  LVEF 29% RV 45%- Possible Noncompaction.  - Echo (4/24): EF 30-35%  - Echo (7/22): EF 40-45% - Echo (9/21): EF 20-25% RV mildly HK in setting of severe HTN - Echo (2/15): EF 35-40% - Echo (8/14): EF 50-55% at [facility] - Echo (7/14): EF 30-35% - Echo (4/14): EF 20-25%  - CPX (2/15) FVC 2.90 (62%)  FEV1 2.22  (60%) FEV1/FVC 76%  Resting HR: 61 Peak HR: 111 (66% age predicted max HR) BP rest: 146/90 BP peak: 168/86 Peak VO2: 22.6 (64.1% predicted peak VO2) VE/VCO2 slope: 29.4 OUES: 2.49 Peak RER: 0.90  - RHC/LHC  (11/14) RA = 6  RV = 33/3/8  PA = 29/9 (18)  PCW = 8  Fick cardiac output/index = 6.4/3.2  PVR = 1.5 WU  FA sat = 95%  PA sat = 72%, 73%  SVC sat 72% Extensive calcification in proximal and mid LAD with only mild intraluminal stenosis with 20-30% stenosis.  ROS: All systems negative except as listed in HPI, PMH and Problem List.  Past Medical History:  Diagnosis Date   Anxiety    CHF (congestive heart failure) (HCC)    takes Furosemide daily   Chronic back pain    Constipation    takes Colace daily   COPD (chronic obstructive pulmonary disease) (HCC)    Spiriva daily   Depression    takes Effexor daily and Risperdal nightly   Dizziness    H/O blood clots 01/03/2010   in leg   Headache    occasionally   Hyperlipidemia    Hypertension    takes Losartan and Coreg daily   Muscle spasm    takes Zizanidine daily as needed   Shortness of breath dyspnea    more with exertion but sometimes notices  with lying/sitting   Sleep apnea    Substance abuse (HCC)    Weakness    tingling and numbness   Current Outpatient Medications  Medication Sig Dispense Refill   acetaminophen (TYLENOL) 500 MG tablet Take 2 tablets (1,000 mg total) by mouth every 6 (six) hours as needed. 30 tablet 0   carvedilol (COREG) 6.25 MG tablet TAKE 1 TABLET(6.25 MG) BY MOUTH TWICE DAILY WITH A MEAL 180 tablet 3   dapagliflozin propanediol (FARXIGA) 10 MG TABS tablet Take 1 tablet (10 mg total) by mouth daily before breakfast. 90 tablet 3   furosemide (LASIX) 20 MG tablet Take 1 tablet (20 mg total) by mouth daily.     lidocaine 4 % Place 1 patch onto the skin daily.     losartan (COZAAR) 100 MG tablet Take 1 tablet (100 mg total) by mouth daily.     methocarbamol (ROBAXIN) 500 MG tablet Take 1  tablet (500 mg total) by mouth 2 (two) times daily. 20 tablet 0   oxyCODONE-acetaminophen (PERCOCET/ROXICET) 5-325 MG tablet Take 1 tablet by mouth every 6 (six) hours as needed for severe pain (pain score 7-10). 10 tablet 0   pregabalin (LYRICA) 100 MG capsule Take 100 mg by mouth 3 (three) times daily.     spironolactone (ALDACTONE) 25 MG tablet Take 1 tablet (25 mg total) by mouth daily. 90 tablet 3   No current facility-administered medications for this encounter.   Social History   Tobacco Use   Smoking status: Every Day    Current packs/day: 0.25    Average packs/day: 0.3 packs/day for 20.0 years (5.0 ttl pk-yrs)    Types: Cigarettes   Smokeless tobacco: Never   Tobacco comments:    1-2 cigs per day (08/15/14)  Vaping Use   Vaping status: Every Day  Substance Use Topics   Alcohol use: No    Alcohol/week: 0.0 standard drinks of alcohol    Comment: stopped in april 2014   Drug use: Not Currently    Types: Cocaine   FHx: - Mom died (30s) in MVA when he was 4 months old - Dad murdered (62yo) when he was in 7th grade  - No FHx of HF  BP (!) 180/111   Pulse 62   Ht 6' 1.5" (1.867 m)   Wt 84 kg (185 lb 3.2 oz)   SpO2 95%   BMI 24.10 kg/m   Wt Readings from Last 3 Encounters:  04/20/23 84 kg (185 lb 3.2 oz)  12/04/22 84 kg (185 lb 3 oz)  11/29/22 84 kg (185 lb 3.2 oz)   PHYSICAL EXAM: General:   No resp difficulty Neck: supple. no JVD.  Cor: PMI nondisplaced. Regular rate & rhythm. No rubs, gallops or murmurs. Lungs: clear Abdomen: soft, nontender, nondistended.  Extremities: no cyanosis, clubbing, rash, edema Neuro: alert & oriented x3  ASSESSMENT & PLAN: 1. Chronic systolic HF/NICM:  - Cath 11/14 normal cors - Echo (2/15): EF ~35-40%  - Echo (9/21): EF 20-25% RV mildly HK in setting of severe HTN - Echo (7/22): EF 40-45%, RV ok. - Lost to follow-up and stopped several meds. - Echo (4/24): EF 30-35%  - Echo 2024  EF 30-35% with prominent LV trabeculations.  RV mildly HK  -CMRI- LVEF 29% RV 45%- Possible Noncompaction.  - NYHA II. Appear euvolemic.  - Continue Farxiga 10 mg daily. - Continue losartan100 mg daily (no Entresto due to angiodema with ACEi). - Continue carvedilol 6.25 mg bid.  - Continue spironolactone 25  mg daily  - Consider next visit.  - Check cMRI to look for LVNC - Not candidate for ICD currently with intermittent noncompliance with GDMT  2. HTN - Uncontrolled but he is out of medications.   3. Polysubstance abuse - Followed at [facility]. -  4. Current smoker - Smokes 1/2 PPD.  -Discussed smoking cessation.   5. OSA  - Noncompliant w/ CPAP,  he lost the machine in a move. Will need to consider   Follow up in 3 months Dr Julane Ny. Stressed medication compliance. SW met with him today.   Check BMET.    Nieves Bars, NP  3:03 PM

## 2023-04-20 NOTE — Patient Instructions (Signed)
 Medication Changes:  None, PLEASE TAKE YOUR MEDICATIONS AS PRESCRIBED   Special Instructions // Education:  Do the following things EVERYDAY: Weigh yourself in the morning before breakfast. Write it down and keep it in a log. Take your medicines as prescribed Eat low salt foods--Limit salt (sodium) to 2000 mg per day.  Stay as active as you can everyday Limit all fluids for the day to less than 2 liters   Follow-Up in: 3 MONTHS   At the [facility], you and your health needs are our priority. We have a designated team specialized in the treatment of Heart Failure. This Care Team includes your primary Heart Failure Specialized Cardiologist (physician), Advanced Practice Providers (APPs- Physician Assistants and Nurse Practitioners), and Pharmacist who all work together to provide you with the care you need, when you need it.   You may see any of the following providers on your designated Care Team at your next follow up:  Dr. Jules Oar Dr. Peder Bourdon Dr. Alwin Baars Dr. Judyth Nunnery Nieves Bars, NP Ruddy Corral, [place] [facility] [place], [place] Dennise Fitz, NP [place] Lee, NP Luster Salters, PharmD   Please be sure to bring in all your medications bottles to every appointment.   Need to Contact Us :  If you have any questions or concerns before your next appointment please send us  a message through [place] or call our office at [phone].    TO LEAVE A MESSAGE FOR THE NURSE SELECT OPTION 2, PLEASE LEAVE A MESSAGE INCLUDING: YOUR NAME DATE OF BIRTH CALL BACK NUMBER REASON FOR CALL**this is important as we prioritize the call backs  YOU WILL RECEIVE A CALL BACK THE SAME DAY AS LONG AS YOU CALL BEFORE 4:00 PM

## 2023-04-20 NOTE — Progress Notes (Signed)
 H&V Care Navigation CSW Progress Note  Clinical Social Worker met with pt in clinic to discuss current financial concerns.  Pt struggling to pay for utilities and medication copays.  Reports he gets a bit over $900/month from disability and about $150/month in food stamps.  States his rent is $450 plus $150-200 in utility expenses and a phone bill- leaves very little money for other things but he is usually able to manage.    Is able $200 behind on utility bill per patient- discussed patient care fund assistance- got grant acknowledgement signed and copy of food stamp card- informed pt we would need [employer] bill in order to move forward with getting approval to pay remainder of bill- pt will plan to send picture to CSW so we can further assess.  No further needs at this time- will follow up with pt regarding financial assistance  Patient is participating in a Managed Medicaid Plan:  Yes  SDOH Screenings   Transportation Needs: Unmet Transportation Needs (03/07/2022)  Depression (PHQ2-9): Low Risk  (07/08/2021)  Financial Resource Strain: High Risk (04/20/2023)  Tobacco Use: High Risk (04/20/2023)    Charles Flakes, LCSW Clinical Social Worker [facility] Desk#: [phone] Cell#: [phone]

## 2023-04-21 ENCOUNTER — Telehealth (HOSPITAL_COMMUNITY): Payer: Self-pay | Admitting: Licensed Clinical Social Worker

## 2023-04-21 NOTE — Telephone Encounter (Signed)
 H&V Care Navigation CSW Progress Note  Clinical Social Worker provided [employer] bill to be considered for patient care fund assistance- currently overdue by $364.46- needs to pay by 5/5 to avoid turn off.  CSW submitted request to supervisor for determination.   Patient is participating in a Managed Medicaid Plan:  Yes  SDOH Screenings   Transportation Needs: Unmet Transportation Needs (03/07/2022)  Depression (PHQ2-9): Low Risk  (07/08/2021)  Financial Resource Strain: High Risk (04/20/2023)  Tobacco Use: High Risk (04/20/2023)   Denton Flakes, LCSW Clinical Social Worker [facility] Desk#: [phone] Cell#: [phone]

## 2023-04-24 ENCOUNTER — Telehealth (HOSPITAL_COMMUNITY): Payer: Self-pay | Admitting: Licensed Clinical Social Worker

## 2023-04-24 NOTE — Telephone Encounter (Signed)
 H&V Care Navigation CSW Progress Note  Clinical Social Worker pledged patient outstanding balance with [employer] to avoid shut off.  Pledge confirmation number [id number].  Check request submitted and will hopefully be mailed by next week.  Patient is participating in a Managed Medicaid Plan:  Yes  SDOH Screenings   Transportation Needs: Unmet Transportation Needs (03/07/2022)  Depression (PHQ2-9): Low Risk  (07/08/2021)  Financial Resource Strain: High Risk (04/24/2023)  Tobacco Use: High Risk (04/20/2023)    Denton Flakes, LCSW Clinical Social Worker [facility] Desk#: [phone] Cell#: [phone]

## 2023-05-15 ENCOUNTER — Ambulatory Visit (INDEPENDENT_AMBULATORY_CARE_PROVIDER_SITE_OTHER)

## 2023-05-15 ENCOUNTER — Encounter (HOSPITAL_COMMUNITY): Payer: Self-pay

## 2023-05-15 ENCOUNTER — Ambulatory Visit (HOSPITAL_COMMUNITY): Admission: EM | Admit: 2023-05-15 | Discharge: 2023-05-15 | Disposition: A | Discharge status: Home / Self Care

## 2023-05-15 DIAGNOSIS — G8929 Other chronic pain: Secondary | ICD-10-CM

## 2023-05-15 DIAGNOSIS — M545 Low back pain, unspecified: Secondary | ICD-10-CM

## 2023-05-15 MED ORDER — KETOROLAC TROMETHAMINE 60 MG/2ML IM SOLN
60.0000 mg | Freq: Once | INTRAMUSCULAR | Status: AC
  Administered 2023-05-15: 60 mg via INTRAMUSCULAR

## 2023-05-15 MED ORDER — BACLOFEN 20 MG PO TABS: 20.0000 mg | ORAL_TABLET | Freq: Three times a day (TID) | ORAL | 0 refills | Status: AC

## 2023-05-15 MED ORDER — DICLOFENAC SODIUM 75 MG PO TBEC: 75.0000 mg | DELAYED_RELEASE_TABLET | Freq: Two times a day (BID) | ORAL | 0 refills | Status: AC

## 2023-05-15 MED ORDER — KETOROLAC TROMETHAMINE 60 MG/2ML IM SOLN
INTRAMUSCULAR | Status: AC
  Filled 2023-05-15: qty 2

## 2023-05-15 NOTE — ED Triage Notes (Addendum)
 Lower back pain started this morning. Patient states pain started  while he was laying in bed. Patient is currently pacing back and forth. Pain 10/10

## 2023-05-15 NOTE — ED Provider Notes (Signed)
 [facility]  Note:  This document was prepared using Dragon voice recognition software and may include unintentional dictation errors.  MRN: [id number] DOB: 1960-04-03  Subjective:   Charles Cervantes is a 63 y.o. male presenting for acute on chronic midline lower back pain that started overnight.  Patient reports pain is 10 out of 10.  Patient denies taking any over-the-counter or previously prescribed medications to treat symptoms.  Patient denies any known trauma or exacerbating activity that may have caused symptoms.  Patient denies any bowel or bladder symptoms corresponding with back pain.  No current facility-administered medications for this encounter.  Current Outpatient Medications:    baclofen (LIORESAL) 20 MG tablet, Take 1 tablet (20 mg total) by mouth 3 (three) times daily., Disp: 30 each, Rfl: 0   carvedilol  (COREG ) 6.25 MG tablet, Take 1 tablet (6.25 mg total) by mouth 2 (two) times daily with a meal., Disp: 180 tablet, Rfl: 3   dapagliflozin  propanediol (FARXIGA ) 10 MG TABS tablet, Take 1 tablet (10 mg total) by mouth daily before breakfast., Disp: 90 tablet, Rfl: 3   diclofenac  (VOLTAREN ) 75 MG EC tablet, Take 1 tablet (75 mg total) by mouth 2 (two) times daily., Disp: 30 tablet, Rfl: 0   furosemide  (LASIX ) 20 MG tablet, Take 1 tablet (20 mg total) by mouth daily., Disp: 90 tablet, Rfl: 3   lidocaine  4 %, Place 1 patch onto the skin daily., Disp: , Rfl:    losartan  (COZAAR ) 100 MG tablet, Take 1 tablet (100 mg total) by mouth daily., Disp: 90 tablet, Rfl: 3   methocarbamol  (ROBAXIN ) 500 MG tablet, Take 1 tablet (500 mg total) by mouth 2 (two) times daily., Disp: 20 tablet, Rfl: 0   pregabalin (LYRICA) 100 MG capsule, Take 100 mg by mouth 3 (three) times daily., Disp: , Rfl:    spironolactone  (ALDACTONE ) 25 MG tablet, Take 1 tablet (25 mg total) by mouth daily., Disp: 90 tablet, Rfl: 3   acetaminophen  (TYLENOL ) 500 MG tablet, Take 2 tablets (1,000 mg total) by mouth  every 6 (six) hours as needed., Disp: 30 tablet, Rfl: 0   Allergies  Allergen Reactions   Ace Inhibitors Swelling    angioedema    Past Medical History:  Diagnosis Date   Anxiety    CHF (congestive heart failure) (HCC)    takes Furosemide  daily   Chronic back pain    Constipation    takes Colace daily   COPD (chronic obstructive pulmonary disease) (HCC)    Spiriva  daily   Depression    takes Effexor  daily and Risperdal  nightly   Dizziness    H/O blood clots 01/03/2010   in leg   Headache    occasionally   Hyperlipidemia    Hypertension    takes Losartan  and Coreg  daily   Muscle spasm    takes Zizanidine daily as needed   Shortness of breath dyspnea    more with exertion but sometimes notices with lying/sitting   Sleep apnea    Substance abuse (HCC)    Weakness    tingling and numbness     Past Surgical History:  Procedure Laterality Date   LEFT AND RIGHT HEART CATHETERIZATION WITH CORONARY ANGIOGRAM N/A 11/20/2012   Procedure: LEFT AND RIGHT HEART CATHETERIZATION WITH CORONARY ANGIOGRAM;  Surgeon: Mardell Shade, MD;  Location[facility] CATH LAB;  Service: Cardiovascular;  Laterality: N/A;   Left Second finger surgery     LUMBAR FUSION  09/05/2014   L5 S1  Family History  Problem Relation Age of Onset   Prostate cancer Paternal Uncle    Colon cancer Neg Hx    Colon polyps Neg Hx    Esophageal cancer Neg Hx    Rectal cancer Neg Hx    Stomach cancer Neg Hx     Social History   Tobacco Use   Smoking status: Every Day    Current packs/day: 0.25    Average packs/day: 0.3 packs/day for 20.0 years (5.0 ttl pk-yrs)    Types: Cigarettes   Smokeless tobacco: Never   Tobacco comments:    1-2 cigs per day (08/15/14)  Vaping Use   Vaping status: Every Day  Substance Use Topics   Alcohol use: No    Alcohol/week: 0.0 standard drinks of alcohol    Comment: stopped in april 2014   Drug use: Not Currently    Types: Cocaine    ROS Refer to HPI for ROS  details.  Objective:   Vitals: BP (!) 165/90 (BP Location: Left Arm)   Pulse 66   Temp 98 F (36.7 C) (Oral)   Resp 17   SpO2 99%   Physical Exam Vitals and nursing note reviewed.  Constitutional:      General: He is not in acute distress.    Appearance: Normal appearance. He is not ill-appearing or toxic-appearing.  HENT:     Head: Normocephalic.  Cardiovascular:     Rate and Rhythm: Normal rate.  Pulmonary:     Effort: Pulmonary effort is normal. No respiratory distress.  Musculoskeletal:     Lumbar back: Spasms, tenderness and bony tenderness present. No swelling, deformity or signs of trauma. Decreased range of motion.  Skin:    General: Skin is warm and dry.     Capillary Refill: Capillary refill takes less than 2 seconds.  Neurological:     General: No focal deficit present.     Mental Status: He is alert and oriented to person, place, and time.  Psychiatric:        Mood and Affect: Mood normal.        Behavior: Behavior normal.     Procedures  No results found for this or any previous visit (from the past 24 hours).  No results found.   Assessment and Plan :     Discharge Instructions       1. Chronic midline low back pain without sciatica (Primary) - ketorolac  (TORADOL ) injection 60 mg given in UC for acute lower back pain - DG Lumbar Spine Complete x-ray completed in UC shows no acute fracture, lumbar fusion appliance appears to be in good placement, no dysfunction noted. - baclofen (LIORESAL) 20 MG tablet; Take 1 tablet (20 mg total) by mouth 3 (three) times daily.  Dispense: 30 each; Refill: 0 - diclofenac  (VOLTAREN ) 75 MG EC tablet; Take 1 tablet (75 mg total) by mouth 2 (two) times daily.  Dispense: 30 tablet; Refill: 0 -Apply heat to lower back 2-3 times a day for 10 to 15 minutes at a time for muscle spasm and pain. -Continue to monitor symptoms for any change in severity if there is any escalation of current symptoms or development of new  symptoms follow-up in ER for further evaluation and management.    Maeryn Mcgath B Karinna Beadles   Joeann Steppe B, [place] 05/15/23 [phone]

## 2023-05-15 NOTE — Discharge Instructions (Addendum)
  1. Chronic midline low back pain without sciatica (Primary) - ketorolac  (TORADOL ) injection 60 mg given in UC for acute lower back pain - DG Lumbar Spine Complete x-ray completed in UC shows no acute fracture, lumbar fusion appliance appears to be in good placement, no dysfunction noted. - baclofen (LIORESAL) 20 MG tablet; Take 1 tablet (20 mg total) by mouth 3 (three) times daily.  Dispense: 30 each; Refill: 0 - diclofenac  (VOLTAREN ) 75 MG EC tablet; Take 1 tablet (75 mg total) by mouth 2 (two) times daily.  Dispense: 30 tablet; Refill: 0 -Apply heat to lower back 2-3 times a day for 10 to 15 minutes at a time for muscle spasm and pain. -Continue to monitor symptoms for any change in severity if there is any escalation of current symptoms or development of new symptoms follow-up in ER for further evaluation and management.

## 2023-07-19 ENCOUNTER — Telehealth (HOSPITAL_COMMUNITY): Payer: Self-pay | Admitting: Family Medicine

## 2023-07-19 NOTE — Telephone Encounter (Signed)
 Called to confirm/remind patient of their appointment at the [facility] on 07/19/23.   Appointment:   [x] Confirmed  [] Left mess   [] No answer/No voice mail  [] VM Full/unable to leave message  [] Phone not in service  Patient reminded to bring all medications and/or complete list.  Confirmed patient has transportation. Gave directions, instructed to utilize valet parking.

## 2023-07-20 ENCOUNTER — Ambulatory Visit (HOSPITAL_COMMUNITY)
Admission: RE | Admit: 2023-07-20 | Discharge: 2023-07-20 | Disposition: A | Discharge status: Home / Self Care | Source: Ambulatory Visit | Attending: Cardiology | Admitting: Cardiology

## 2023-07-20 ENCOUNTER — Encounter (HOSPITAL_COMMUNITY): Payer: Self-pay

## 2023-07-20 VITALS — BP 158/84 | HR 59 | Ht 73.5 in | Wt 174.4 lb

## 2023-07-20 DIAGNOSIS — F17210 Nicotine dependence, cigarettes, uncomplicated: Secondary | ICD-10-CM | POA: Diagnosis not present

## 2023-07-20 DIAGNOSIS — G4733 Obstructive sleep apnea (adult) (pediatric): Secondary | ICD-10-CM | POA: Diagnosis not present

## 2023-07-20 DIAGNOSIS — F1910 Other psychoactive substance abuse, uncomplicated: Secondary | ICD-10-CM | POA: Insufficient documentation

## 2023-07-20 DIAGNOSIS — I5022 Chronic systolic (congestive) heart failure: Secondary | ICD-10-CM | POA: Insufficient documentation

## 2023-07-20 DIAGNOSIS — Z7984 Long term (current) use of oral hypoglycemic drugs: Secondary | ICD-10-CM | POA: Diagnosis not present

## 2023-07-20 DIAGNOSIS — Z91199 Patient's noncompliance with other medical treatment and regimen due to unspecified reason: Secondary | ICD-10-CM | POA: Insufficient documentation

## 2023-07-20 DIAGNOSIS — I110 Hypertensive heart disease with heart failure: Secondary | ICD-10-CM | POA: Insufficient documentation

## 2023-07-20 DIAGNOSIS — F17200 Nicotine dependence, unspecified, uncomplicated: Secondary | ICD-10-CM

## 2023-07-20 DIAGNOSIS — I428 Other cardiomyopathies: Secondary | ICD-10-CM | POA: Insufficient documentation

## 2023-07-20 DIAGNOSIS — I159 Secondary hypertension, unspecified: Secondary | ICD-10-CM | POA: Diagnosis not present

## 2023-07-20 DIAGNOSIS — Z79899 Other long term (current) drug therapy: Secondary | ICD-10-CM | POA: Insufficient documentation

## 2023-07-20 MED ORDER — LOSARTAN POTASSIUM 100 MG PO TABS: ORAL_TABLET | ORAL | 3 refills | Status: DC

## 2023-07-20 NOTE — Progress Notes (Addendum)
 ADVANCED HF CLINIC NOTE  PCP: Diona Perkins, MD HF Cardiologist: Dr. Cherrie Reason for Visit: Heart Failure Follow-up  HPI: Charles Cervantes is a 63 y.o. male with systolic heart failure diagnosed in 2014 in [place] secondary to NICM (normal cors on cath), EF 20-25%. Other history includes polysubstance abuse (cocaine, tobacco and alcohol).  He moved from [facility] and was admitted to [facility] in 4/14 with a/c CHF.  Echo showed EF 20-25% with normal RV.  Discharge weight 147 pounds.  Admitted To [facility] in 8/14 with HF. EF reported 50-55% on echo. 10/2012 bedside echo in our HF Clinic: EF 35-40%. In 11/2012 presented to clinic c/o recurrent CP and severe HF symptoms but seemed well compensated on exam R/L cath showed normal cors.   Readmitted to [facility] 9/14-17/21 with severe HTN and recurrent CP anfd HF. Reported he was taking his HF meds except his fluid pill. BP on arrival 163/118. Diuresed and BP controlled. Echo 09/18/19 EF 20-25% RV mildly HK   Post hospital f/u 9/21 enrolled back in HF Clinic (previously discharged due to inappropriate behavior towards staff).  Echo 7/22: EF had improved to 40-45%, RV normal. Trival Charles. No AS.   Been in two treatment centers in [place] at [facility] 2/9-03/11/20. Then relapsed and went to [facility] 3/22.   He returns today for heart failure follow up. Overall feeling fine. NYHA II. Reports fatigue. Denies chest pain, orthopnea, palpitations, and dizziness. Able to perform ADLs. Appetite okay, has been avoiding salty foods. Smoking 0.5-1 ppd. Reports medication compliance, however medications have not been filled since 04/2023, jardiance last filled in 11/24.   Cardiac Studies - CMRI (1/25): LVEF 29% RV 45% - Possible Noncompaction. - Echo (4/24): EF 30-35%  - Echo (7/22): EF 40-45% - Echo (9/21): EF 20-25% RV mildly HK in setting of severe HTN - Echo (2/15): EF 35-40% - Echo (8/14): EF 50-55% at [facility] - Echo (7/14): EF 30-35% - Echo (4/14): EF 20-25%  - CPX (2/15):  FVC 2.90 (62%), FEV1 2.22 (60%), FEV1/FVC 76%  Resting HR: 61 Peak HR: 111 (66% age predicted max HR) BP rest: 146/90 BP peak: 168/86 Peak VO2: 22.6 (64.1% predicted peak VO2) VE/VCO2 slope: 29.4 OUES: 2.49 Peak RER: 0.90  - R/LHC (11/14): RA 6, PA 29/9 (18), PCWP 8, CO/CI (fick) 6.4/3.2, PVR 1.5 wu Extensive calcification in proximal and mid LAD with only mild intraluminal stenosis with 20-30% stenosis.   ROS: All systems negative except as listed in HPI, PMH and Problem List.  Past Medical History:  Diagnosis Date   Anxiety    CHF (congestive heart failure) (HCC)    takes Furosemide  daily   Chronic back pain    Constipation    takes Colace daily   COPD (chronic obstructive pulmonary disease) (HCC)    Spiriva  daily   Depression    takes Effexor  daily and Risperdal  nightly   Dizziness    H/O blood clots 01/03/2010   in leg   Headache    occasionally   Hyperlipidemia    Hypertension    takes Losartan  and Coreg  daily   Muscle spasm    takes Zizanidine daily as needed   Shortness of breath dyspnea    more with exertion but sometimes notices with lying/sitting   Sleep apnea    Substance abuse (HCC)    Weakness    tingling and numbness   Current Outpatient Medications  Medication Sig Dispense Refill   acetaminophen  (TYLENOL ) 500 MG tablet Take 2 tablets (1,000 mg  total) by mouth every 6 (six) hours as needed. 30 tablet 0   baclofen  (LIORESAL ) 20 MG tablet Take 1 tablet (20 mg total) by mouth 3 (three) times daily. 30 each 0   carvedilol  (COREG ) 6.25 MG tablet Take 1 tablet (6.25 mg total) by mouth 2 (two) times daily with a meal. 180 tablet 3   dapagliflozin  propanediol (FARXIGA ) 10 MG TABS tablet Take 1 tablet (10 mg total) by mouth daily before breakfast. 90 tablet 3   diclofenac  (VOLTAREN ) 75 MG EC tablet Take 1 tablet (75 mg total) by mouth 2 (two) times daily. 30 tablet 0   furosemide  (LASIX ) 20 MG tablet Take 1 tablet (20 mg total) by mouth daily. 90 tablet 3    lidocaine  4 % Place 1 patch onto the skin daily.     losartan  (COZAAR ) 100 MG tablet Take 1 tablet (100 mg total) by mouth daily. 90 tablet 3   methocarbamol  (ROBAXIN ) 500 MG tablet Take 1 tablet (500 mg total) by mouth 2 (two) times daily. 20 tablet 0   pregabalin (LYRICA) 100 MG capsule Take 100 mg by mouth 3 (three) times daily.     spironolactone  (ALDACTONE ) 25 MG tablet Take 1 tablet (25 mg total) by mouth daily. 90 tablet 3   No current facility-administered medications for this encounter.   Social History   Tobacco Use   Smoking status: Every Day    Current packs/day: 0.25    Average packs/day: 0.3 packs/day for 20.0 years (5.0 ttl pk-yrs)    Types: Cigarettes   Smokeless tobacco: Never   Tobacco comments:    1-2 cigs per day (08/15/14)  Vaping Use   Vaping status: Every Day  Substance Use Topics   Alcohol use: No    Alcohol/week: 0.0 standard drinks of alcohol    Comment: stopped in april 2014   Drug use: Not Currently    Types: Cocaine   FHx: - Mom died (30s) in MVA when he was 59 months old - Dad murdered (65yo) when he was in 7th grade  - No FHx of HF  BP (!) 158/84   Pulse (!) 59   Ht 6' 1.5 (1.867 m)   Wt 79.1 kg (174 lb 6.4 oz)   SpO2 95%   BMI 22.70 kg/m   Wt Readings from Last 3 Encounters:  07/20/23 79.1 kg (174 lb 6.4 oz)  04/20/23 84 kg (185 lb 3.2 oz)  12/04/22 84 kg (185 lb 3 oz)   PHYSICAL EXAM: General: Well appearing. No distress on RA Cardiac: JVP flat. S1 and S2 present. No murmurs or rub. Extremities: Warm and dry.  No edema.  Neuro: Alert and oriented x3. Affect pleasant. Moves all extremities without difficulty.  ECG (personally reviewed): SB 57 bpm, LVH  ASSESSMENT & PLAN: 1. Chronic systolic HF/NICM:  - Cath 11/14 normal cors - EF has remained in the 30-40% range since 2015. Noncompliance r/t polysubstance abuse has been an issue in the past.  - Echo 11/24: EF 30-35% with prominent LV trabeculations. RV mildly HK  - CMRI 1/25:  LVEF 29% RV 45% -  Possible Noncompaction.  - NYHA II. Appear euvolemic. Has PRN Lasix  at home, has not needed.  - Continue Farxiga  10 mg daily (last filled 11/24) - Increase losartan  100 mg qam + 50 mg qpm (no Entresto due to angiodema with ACEi). - Continue carvedilol  6.25 mg bid.  - Continue spironolactone  25 mg daily  - Meds last filled in April, will keep med reg  simple, as I am not sure how consistently he is taking or filling them  - Not candidate for ICD currently with intermittent noncompliance with GDMT  2. HTN - BP elevated today.  - increase ARB as above - would benefit from hydral, but I don't think that he will take a tid med consistently.   3. Polysubstance abuse - Followed at [facility].  4. Current smoker - Smoking 0.5-1ppd  - Discussed smoking cessation.   5. OSA  - Noncompliant w/ CPAP, lost his. Needs to get back in with PCP.   Discussed med compliance. He is unable to be seen in PCP office without an ID card. He has let his driver's liscense lapse and now has not been able to go to the [facility] to get a new one.   Follow up in 3 months Dr Cherrie.   [place] Reshawn Ostlund, NP  1:52 PM

## 2023-07-20 NOTE — Patient Instructions (Addendum)
 Thank you for coming in today  If you had labs drawn today, any labs that are abnormal the clinic will call you No news is good news  Medications: Increase Losartan  to 100 mg every morning and  50 mg every evening   Follow up appointments:  Your physician recommends that you schedule a follow-up appointment in:  3 months With Dr. Cherrie  Please call our office to schedule the follow-up appointment in August for October 2025.   Do the following things EVERYDAY: Weigh yourself in the morning before breakfast. Write it down and keep it in a log. Take your medicines as prescribed Eat low salt foods--Limit salt (sodium) to 2000 mg per day.  Stay as active as you can everyday Limit all fluids for the day to less than 2 liters   At the [facility], you and your health needs are our priority. As part of our continuing mission to provide you with exceptional heart care, we have created designated Provider Care Teams. These Care Teams include your primary Cardiologist (physician) and Advanced Practice Providers (APPs- Physician Assistants and Nurse Practitioners) who all work together to provide you with the care you need, when you need it.   You may see any of the following providers on your designated Care Team at your next follow up: Dr Toribio Cherrie Dr Ezra Shuck Dr. Ria Gardenia Greig Lenetta, NP Caffie Shed, [place] [facility] [place], [place] Beckey Coe, NP Tinnie Redman, PharmD   Please be sure to bring in all your medications bottles to every appointment.    Thank you for choosing [facility]  If you have any questions or concerns before your next appointment please send us  a message through [place] or call our office at [phone].    TO LEAVE A MESSAGE FOR THE NURSE SELECT OPTION 2, PLEASE LEAVE A MESSAGE INCLUDING: YOUR NAME DATE OF BIRTH CALL BACK NUMBER REASON FOR CALL**this is important as  we prioritize the call backs  YOU WILL RECEIVE A CALL BACK THE SAME DAY AS LONG AS YOU CALL BEFORE 4:00 PM

## 2023-07-21 ENCOUNTER — Ambulatory Visit (HOSPITAL_COMMUNITY): Payer: Self-pay | Admitting: Cardiology

## 2023-07-21 ENCOUNTER — Ambulatory Visit (HOSPITAL_COMMUNITY)
Admission: RE | Admit: 2023-07-21 | Discharge: 2023-07-21 | Disposition: A | Discharge status: Home / Self Care | Source: Ambulatory Visit | Attending: Cardiology | Admitting: Cardiology

## 2023-07-21 DIAGNOSIS — I5022 Chronic systolic (congestive) heart failure: Secondary | ICD-10-CM | POA: Insufficient documentation

## 2023-07-21 LAB — BASIC METABOLIC PANEL WITH GFR
Anion gap: 7 (ref 5–15)
BUN: 9 mg/dL (ref 8–23)
CO2: 24 mmol/L (ref 22–32)
Calcium: 8.7 mg/dL — ABNORMAL LOW (ref 8.9–10.3)
Chloride: 106 mmol/L (ref 98–111)
Creatinine, Ser: 1.07 mg/dL (ref 0.61–1.24)
GFR, Estimated: >60 mL/min (ref >60)
Glucose, Bld: 108 mg/dL — ABNORMAL HIGH (ref 70–99)
Potassium: 3.9 mmol/L (ref 3.5–5.1)
Sodium: 137 mmol/L (ref 135–145)

## 2023-09-18 ENCOUNTER — Telehealth (HOSPITAL_COMMUNITY): Payer: Self-pay | Admitting: Licensed Clinical Social Worker

## 2023-09-18 NOTE — Telephone Encounter (Signed)
 Pt called to request assistance with utility bill- informed pt we would need updated bill in order to assist- pt will obtain and bring to CSW.  Andriette HILARIO Leech, LCSW Clinical Social Worker [facility] Desk#: [phone] Cell#: [phone]

## 2023-09-20 ENCOUNTER — Telehealth (HOSPITAL_COMMUNITY): Payer: Self-pay | Admitting: Licensed Clinical Social Worker

## 2023-09-20 NOTE — Telephone Encounter (Signed)
 H&V Care Navigation CSW Progress Note  Clinical Social Worker received necessary paperwork to assist with [employer]- check request submitted for approval.  Patient is participating in a Managed Medicaid Plan:  Yes  SDOH Screenings   Transportation Needs: Unmet Transportation Needs (03/07/2022)  Depression (PHQ2-9): Low Risk  (07/08/2021)  Financial Resource Strain: High Risk (09/20/2023)  Tobacco Use: High Risk (07/20/2023)     Andriette HILARIO Leech, LCSW Clinical Social Worker [facility] Desk#: [phone] Cell#: [phone]

## 2023-10-02 ENCOUNTER — Telehealth (HOSPITAL_COMMUNITY): Payer: Self-pay | Admitting: Licensed Clinical Social Worker

## 2023-10-02 NOTE — Telephone Encounter (Signed)
 CSW confirmed with Accounts Pay[employer] that check has been sent to [employer]- CSW informed pt and encouraged to check for receipt of payment.  Will continue to follow and assist as needed.  Andriette HILARIO Leech, LCSW Clinical Social Worker [facility] Desk#: [phone] Cell#: [phone]

## 2023-10-19 ENCOUNTER — Telehealth (HOSPITAL_COMMUNITY): Payer: Self-pay | Admitting: Internal Medicine

## 2023-10-19 NOTE — Telephone Encounter (Signed)
 Called to confirm/remind patient of their appointment at the [facility] on 10/19/2023.   Appointment:   [] Confirmed  [] Left mess   [x] No answer/No voice mail  [] VM Full/unable to leave message  [] Phone not in service  Patient reminded to bring all medications and/or complete list.  Confirmed patient has transportation. Gave directions, instructed to utilize valet parking.

## 2023-10-20 ENCOUNTER — Encounter (HOSPITAL_COMMUNITY): Admitting: Internal Medicine

## 2023-11-01 ENCOUNTER — Telehealth (HOSPITAL_COMMUNITY): Payer: Self-pay | Admitting: Adult Health

## 2023-11-01 NOTE — Telephone Encounter (Signed)
 Called to confirm/remind patient of their appointment at the [facility] on 11/01/23.   Appointment:   [x] Confirmed  [] Left mess   [] No answer/No voice mail  [] VM Full/unable to leave message  [] Phone not in service  Patient reminded to bring all medications and/or complete list.  Confirmed patient has transportation. Gave directions, instructed to utilize valet parking.

## 2023-11-02 NOTE — Progress Notes (Incomplete)
 ADVANCED HF CLINIC NOTE  PCP: Diona Perkins, MD HF Cardiologist: Dr. Cherrie Reason for Visit: Heart Failure Follow-up  HPI: Mr Rho is a 63 y.o. male with systolic heart failure diagnosed in 2014 in [place] secondary to NICM (normal cors on cath), EF 20-25%. Other history includes polysubstance abuse (cocaine, tobacco and alcohol).  He moved from [facility] and was admitted to [facility] in 4/14 with a/c CHF.  Echo showed EF 20-25% with normal RV.  Discharge weight 147 pounds.  Admitted To [facility] in 8/14 with HF. EF reported 50-55% on echo. 10/2012 bedside echo in our HF Clinic: EF 35-40%. In 11/2012 presented to clinic c/o recurrent CP and severe HF symptoms but seemed well compensated on exam R/L cath showed normal cors.   Readmitted to [facility] 9/14-17/21 with severe HTN and recurrent CP anfd HF. Reported he was taking his HF meds except his fluid pill. BP on arrival 163/118. Diuresed and BP controlled. Echo 09/18/19 EF 20-25% RV mildly HK   Post hospital f/u 9/21 enrolled back in HF Clinic (previously discharged due to inappropriate behavior towards staff).  Echo 7/22: EF had improved to 40-45%, RV normal. Trival MR. No AS.   Been in two treatment centers in [place] at [facility] 2/9-03/11/20. Then relapsed and went to [facility] 3/22.   Today he returns for AHF follow up. Overall feeling ***. Denies palpitations, CP, dizziness, edema, or PND/Orthopnea. *** SOB. Appetite ok. No fever or chills. Weight at home *** pounds. Taking all medications. Denies ETOH, tobacco or drug use.   Cardiac Studies - CMRI (1/25): LVEF 29% RV 45% - Possible Noncompaction. - Echo (4/24): EF 30-35%  - Echo (7/22): EF 40-45% - Echo (9/21): EF 20-25% RV mildly HK in setting of severe HTN - Echo (2/15): EF 35-40% - Echo (8/14): EF 50-55% at [facility] - Echo (7/14): EF 30-35% - Echo (4/14): EF 20-25%  - CPX (2/15): FVC 2.90 (62%), FEV1 2.22 (60%), FEV1/FVC 76%  Resting HR: 61 Peak HR: 111 (66% age predicted max HR) BP rest:  146/90 BP peak: 168/86 Peak VO2: 22.6 (64.1% predicted peak VO2) VE/VCO2 slope: 29.4 OUES: 2.49 Peak RER: 0.90  - R/LHC (11/14): RA 6, PA 29/9 (18), PCWP 8, CO/CI (fick) 6.4/3.2, PVR 1.5 wu Extensive calcification in proximal and mid LAD with only mild intraluminal stenosis with 20-30% stenosis.   ROS: All systems negative except as listed in HPI, PMH and Problem List.  Past Medical History:  Diagnosis Date   Anxiety    CHF (congestive heart failure) (HCC)    takes Furosemide  daily   Chronic back pain    Constipation    takes Colace daily   COPD (chronic obstructive pulmonary disease) (HCC)    Spiriva  daily   Depression    takes Effexor  daily and Risperdal  nightly   Dizziness    H/O blood clots 01/03/2010   in leg   Headache    occasionally   Hyperlipidemia    Hypertension    takes Losartan  and Coreg  daily   Muscle spasm    takes Zizanidine daily as needed   Shortness of breath dyspnea    more with exertion but sometimes notices with lying/sitting   Sleep apnea    Substance abuse (HCC)    Weakness    tingling and numbness   Current Outpatient Medications  Medication Sig Dispense Refill   acetaminophen  (TYLENOL ) 500 MG tablet Take 2 tablets (1,000 mg total) by mouth every 6 (six) hours as needed. 30 tablet 0  baclofen  (LIORESAL ) 20 MG tablet Take 1 tablet (20 mg total) by mouth 3 (three) times daily. 30 each 0   carvedilol  (COREG ) 6.25 MG tablet Take 1 tablet (6.25 mg total) by mouth 2 (two) times daily with a meal. 180 tablet 3   dapagliflozin  propanediol (FARXIGA ) 10 MG TABS tablet Take 1 tablet (10 mg total) by mouth daily before breakfast. 90 tablet 3   diclofenac  (VOLTAREN ) 75 MG EC tablet Take 1 tablet (75 mg total) by mouth 2 (two) times daily. 30 tablet 0   furosemide  (LASIX ) 20 MG tablet Take 1 tablet (20 mg total) by mouth daily. 90 tablet 3   lidocaine  4 % Place 1 patch onto the skin daily.     losartan  (COZAAR ) 100 MG tablet Take 1 tablet (100 mg total) by  mouth every morning AND 0.5 tablets (50 mg total) every evening. 135 tablet 3   methocarbamol  (ROBAXIN ) 500 MG tablet Take 1 tablet (500 mg total) by mouth 2 (two) times daily. 20 tablet 0   pregabalin (LYRICA) 100 MG capsule Take 100 mg by mouth 3 (three) times daily.     spironolactone  (ALDACTONE ) 25 MG tablet Take 1 tablet (25 mg total) by mouth daily. 90 tablet 3   No current facility-administered medications for this visit.   Social History   Tobacco Use   Smoking status: Every Day    Current packs/day: 0.25    Average packs/day: 0.3 packs/day for 20.0 years (5.0 ttl pk-yrs)    Types: Cigarettes   Smokeless tobacco: Never   Tobacco comments:    1-2 cigs per day (08/15/14)  Vaping Use   Vaping status: Every Day  Substance Use Topics   Alcohol use: No    Alcohol/week: 0.0 standard drinks of alcohol    Comment: stopped in april 2014   Drug use: Not Currently    Types: Cocaine   FHx: - Mom died (30s) in MVA when he was 80 months old - Dad murdered (65yo) when he was in 7th grade  - No FHx of HF  There were no vitals taken for this visit.  Wt Readings from Last 3 Encounters:  07/20/23 79.1 kg (174 lb 6.4 oz)  04/20/23 84 kg (185 lb 3.2 oz)  12/04/22 84 kg (185 lb 3 oz)   PHYSICAL EXAM: General:  *** appearing.  No respiratory difficulty Neck: JVD *** cm.  Cor: Regular rate & rhythm. No murmurs. Lungs: clear Extremities: no edema  Neuro: alert & oriented x 3. Affect pleasant.   ECG (personally reviewed): SB 57 bpm, LVH  ASSESSMENT & PLAN: 1. Chronic systolic HF/NICM:  - Cath 11/14 normal cors - EF has remained in the 30-40% range since 2015. Noncompliance r/t polysubstance abuse has been an issue in the past.  - Echo 11/24: EF 30-35% with prominent LV trabeculations. RV mildly HK  - CMRI 1/25: LVEF 29% RV 45% -  Possible Noncompaction.  - NYHA II. Appear euvolemic. Has PRN Lasix  at home, has not needed.  - Continue Farxiga  10 mg daily (last filled 11/24) -  Increase losartan  100 mg qam + 50 mg qpm (no Entresto due to angiodema with ACEi). - Continue carvedilol  6.25 mg bid.  - Continue spironolactone  25 mg daily  - Meds last filled in April, will keep med reg simple, as I am not sure how consistently he is taking or filling them  - Not candidate for ICD currently with intermittent noncompliance with GDMT  2. HTN - BP elevated today.  -  increase ARB as above - would benefit from hydral, but I don't think that he will take a tid med consistently.   3. Polysubstance abuse - Followed at [facility].  4. Current smoker - Smoking 0.5-1ppd  - Discussed smoking cessation.   5. OSA  - Noncompliant w/ CPAP, lost his. Needs to get back in with PCP.   Discussed med compliance. He is unable to be seen in PCP office without an ID card. He has let his driver's liscense lapse and now has not been able to go to the [facility] to get a new one.   Follow up in 3 months Dr Cherrie.   Beckey LITTIE Coe, NP  1:32 PM

## 2023-11-06 ENCOUNTER — Ambulatory Visit (HOSPITAL_COMMUNITY)

## 2023-11-09 ENCOUNTER — Telehealth (HOSPITAL_COMMUNITY): Payer: Self-pay | Admitting: Family Medicine

## 2023-11-09 NOTE — Telephone Encounter (Signed)
 Called to confirm/remind patient of their appointment at the Advanced Heart Failure Clinic on ***.  11/09/23 Appointment:   [x] Confirmed  [] Left mess   [] No answer/No voice mail  [] VM Full/unable to leave message  [] Phone not in service  Patient reminded to bring all medications and/or complete list.  Confirmed patient has transportation. Gave directions, instructed to utilize valet parking.

## 2023-11-10 ENCOUNTER — Encounter (HOSPITAL_COMMUNITY): Payer: Self-pay

## 2023-11-10 ENCOUNTER — Ambulatory Visit (HOSPITAL_COMMUNITY)
Admission: RE | Admit: 2023-11-10 | Discharge: 2023-11-10 | Disposition: A | Discharge status: Home / Self Care | Source: Ambulatory Visit | Attending: Physician Assistant | Admitting: Physician Assistant

## 2023-11-10 ENCOUNTER — Ambulatory Visit (HOSPITAL_COMMUNITY): Payer: Self-pay | Admitting: Physician Assistant

## 2023-11-10 ENCOUNTER — Telehealth (HOSPITAL_COMMUNITY): Payer: Self-pay | Admitting: Internal Medicine

## 2023-11-10 VITALS — BP 130/84 | HR 70 | Ht 73.5 in | Wt 165.0 lb

## 2023-11-10 DIAGNOSIS — T465X6A Underdosing of other antihypertensive drugs, initial encounter: Secondary | ICD-10-CM | POA: Insufficient documentation

## 2023-11-10 DIAGNOSIS — F1410 Cocaine abuse, uncomplicated: Secondary | ICD-10-CM

## 2023-11-10 DIAGNOSIS — G4733 Obstructive sleep apnea (adult) (pediatric): Secondary | ICD-10-CM | POA: Diagnosis not present

## 2023-11-10 DIAGNOSIS — F17290 Nicotine dependence, other tobacco product, uncomplicated: Secondary | ICD-10-CM | POA: Diagnosis not present

## 2023-11-10 DIAGNOSIS — F17210 Nicotine dependence, cigarettes, uncomplicated: Secondary | ICD-10-CM | POA: Diagnosis not present

## 2023-11-10 DIAGNOSIS — F17200 Nicotine dependence, unspecified, uncomplicated: Secondary | ICD-10-CM

## 2023-11-10 DIAGNOSIS — I428 Other cardiomyopathies: Secondary | ICD-10-CM | POA: Insufficient documentation

## 2023-11-10 DIAGNOSIS — Z79899 Other long term (current) drug therapy: Secondary | ICD-10-CM | POA: Diagnosis not present

## 2023-11-10 DIAGNOSIS — I110 Hypertensive heart disease with heart failure: Secondary | ICD-10-CM | POA: Diagnosis not present

## 2023-11-10 DIAGNOSIS — Z634 Disappearance and death of family member: Secondary | ICD-10-CM | POA: Diagnosis not present

## 2023-11-10 DIAGNOSIS — I5022 Chronic systolic (congestive) heart failure: Secondary | ICD-10-CM | POA: Insufficient documentation

## 2023-11-10 DIAGNOSIS — F1910 Other psychoactive substance abuse, uncomplicated: Secondary | ICD-10-CM | POA: Diagnosis not present

## 2023-11-10 DIAGNOSIS — Z91199 Patient's noncompliance with other medical treatment and regimen due to unspecified reason: Secondary | ICD-10-CM | POA: Diagnosis not present

## 2023-11-10 DIAGNOSIS — I10 Essential (primary) hypertension: Secondary | ICD-10-CM

## 2023-11-10 LAB — BRAIN NATRIURETIC PEPTIDE: B Natriuretic Peptide: 137.6 pg/mL — ABNORMAL HIGH (ref 0.0–100.0)

## 2023-11-10 LAB — BASIC METABOLIC PANEL WITH GFR
Anion gap: 8 (ref 5–15)
BUN: 9 mg/dL (ref 8–23)
CO2: 25 mmol/L (ref 22–32)
Calcium: 9 mg/dL (ref 8.9–10.3)
Chloride: 107 mmol/L (ref 98–111)
Creatinine, Ser: 1.24 mg/dL (ref 0.61–1.24)
GFR, Estimated: >60 mL/min (ref >60)
Glucose, Bld: 86 mg/dL (ref 70–99)
Potassium: 4.9 mmol/L (ref 3.5–5.1)
Sodium: 140 mmol/L (ref 135–145)

## 2023-11-10 MED ORDER — FUROSEMIDE 20 MG PO TABS: 20.0000 mg | ORAL_TABLET | Freq: Every day | ORAL | Status: AC | PRN

## 2023-11-10 MED ORDER — LOSARTAN POTASSIUM 100 MG PO TABS: 150.0000 mg | ORAL_TABLET | Freq: Every morning | ORAL | Status: AC

## 2023-11-10 NOTE — Progress Notes (Signed)
 ADVANCED HF CLINIC NOTE  PCP: Diona Perkins, MD HF Cardiologist: Dr. Cherrie Reason for Visit: CHF  HPI: Charles Cervantes is a 62 y.o. male with systolic heart failure diagnosed in 2014 in [place] secondary to NICM (normal cors on cath), EF 20-25%. Other history includes polysubstance abuse (cocaine, tobacco and alcohol).  He moved from [facility] and was admitted to [facility] in 4/14 with a/c CHF.  Echo showed EF 20-25% with normal RV.  Discharge weight 147 pounds.  Admitted To [facility] in 8/14 with HF. EF reported 50-55% on echo. 10/2012 bedside echo in our HF Clinic: EF 35-40%. In 11/2012 presented to clinic c/o recurrent CP and severe HF symptoms but seemed well compensated on exam R/L cath showed normal cors.   Readmitted to [facility] 9/14-17/21 with severe HTN and recurrent CP anfd HF. Reported he was taking his HF meds except his fluid pill. BP on arrival 163/118. Diuresed and BP controlled. Echo 09/18/19 EF 20-25% RV mildly HK   Post hospital f/u 9/21 enrolled back in HF Clinic (previously discharged due to inappropriate behavior towards staff).  Echo 7/22: EF had improved to 40-45%, RV normal. Trival Charles. No AS.   Been in two treatment centers in [place] at [facility] 2/9-03/11/20. Then relapsed and went to [facility] 3/22.   Here today for CHF follow-up. He has been stable from HF standpoint. Denies chest pain, shortness of breath, orthopnea, PND and lower extremity edema. Weight down about 9 lb from last visit. Admits he has been down after losing his sister and niece within the last couple of months. He is taking medications as prescribed except losartan , on 100 mg daily instead of 150 mg daily. Denies recent crack/cocaine use. Has cut back tobacco use.   Cardiac Studies - CMRI (1/25): LVEF 29% RV 45% - Possible Noncompaction. - Echo (4/24): EF 30-35%  - Echo (7/22): EF 40-45% - Echo (9/21): EF 20-25% RV mildly HK in setting of severe HTN - Echo (2/15): EF 35-40% - Echo (8/14): EF 50-55% at [facility] - Echo  (7/14): EF 30-35% - Echo (4/14): EF 20-25%  - CPX (2/15): FVC 2.90 (62%), FEV1 2.22 (60%), FEV1/FVC 76%  Resting HR: 61 Peak HR: 111 (66% age predicted max HR) BP rest: 146/90 BP peak: 168/86 Peak VO2: 22.6 (64.1% predicted peak VO2) VE/VCO2 slope: 29.4 OUES: 2.49 Peak RER: 0.90  - R/LHC (11/14): RA 6, PA 29/9 (18), PCWP 8, CO/CI (fick) 6.4/3.2, PVR 1.5 wu Extensive calcification in proximal and mid LAD with only mild intraluminal stenosis with 20-30% stenosis.   ROS: All systems negative except as listed in HPI, PMH and Problem List.  Past Medical History:  Diagnosis Date   Anxiety    CHF (congestive heart failure) (HCC)    takes Furosemide  daily   Chronic back pain    Constipation    takes Colace daily   COPD (chronic obstructive pulmonary disease) (HCC)    Spiriva  daily   Depression    takes Effexor  daily and Risperdal  nightly   Dizziness    H/O blood clots 01/03/2010   in leg   Headache    occasionally   Hyperlipidemia    Hypertension    takes Losartan  and Coreg  daily   Muscle spasm    takes Zizanidine daily as needed   Shortness of breath dyspnea    more with exertion but sometimes notices with lying/sitting   Sleep apnea    Substance abuse (HCC)    Weakness    tingling and numbness  Current Outpatient Medications  Medication Sig Dispense Refill   acetaminophen  (TYLENOL ) 500 MG tablet Take 2 tablets (1,000 mg total) by mouth every 6 (six) hours as needed. 30 tablet 0   baclofen  (LIORESAL ) 20 MG tablet Take 1 tablet (20 mg total) by mouth 3 (three) times daily. 30 each 0   carvedilol  (COREG ) 6.25 MG tablet Take 1 tablet (6.25 mg total) by mouth 2 (two) times daily with a meal. 180 tablet 3   dapagliflozin  propanediol (FARXIGA ) 10 MG TABS tablet Take 1 tablet (10 mg total) by mouth daily before breakfast. 90 tablet 3   diclofenac  (VOLTAREN ) 75 MG EC tablet Take 1 tablet (75 mg total) by mouth 2 (two) times daily. 30 tablet 0   lidocaine  4 % Place 1 patch onto  the skin daily.     methocarbamol  (ROBAXIN ) 500 MG tablet Take 1 tablet (500 mg total) by mouth 2 (two) times daily. 20 tablet 0   spironolactone  (ALDACTONE ) 25 MG tablet Take 1 tablet (25 mg total) by mouth daily. 90 tablet 3   furosemide  (LASIX ) 20 MG tablet Take 1 tablet (20 mg total) by mouth daily as needed.     losartan  (COZAAR ) 100 MG tablet Take 1.5 tablets (150 mg total) by mouth every morning.     pregabalin (LYRICA) 100 MG capsule Take 100 mg by mouth 3 (three) times daily. (Patient not taking: Reported on 11/10/2023)     No current facility-administered medications for this encounter.   Social History   Tobacco Use   Smoking status: Every Day    Current packs/day: 0.25    Average packs/day: 0.3 packs/day for 20.0 years (5.0 ttl pk-yrs)    Types: Cigarettes   Smokeless tobacco: Never   Tobacco comments:    1-2 cigs per day (08/15/14)  Vaping Use   Vaping status: Every Day  Substance Use Topics   Alcohol use: No    Alcohol/week: 0.0 standard drinks of alcohol    Comment: stopped in april 2014   Drug use: Not Currently    Types: Cocaine   FHx: - Mom died (30s) in MVA when he was 42 months old - Dad murdered (65yo) when he was in 7th grade  - No FHx of HF  BP 130/84   Pulse 70   Ht 6' 1.5 (1.867 m)   Wt 74.8 kg (165 lb)   SpO2 98%   BMI 21.47 kg/m   Wt Readings from Last 3 Encounters:  11/10/23 74.8 kg (165 lb)  07/20/23 79.1 kg (174 lb 6.4 oz)  04/20/23 84 kg (185 lb 3.2 oz)   PHYSICAL EXAM: General:  Thin, NAD. Ambulated into clinic. Cor: No JVD. Regular rate & rhythm. No murmurs. Lungs: clear Abdomen: soft, nontender, nondistended. Extremities: no edema Neuro: alert & orientedx3. Affect pleasant  ASSESSMENT & PLAN: 1. Chronic systolic HF/NICM:  - Cath 11/14 normal cors - EF has remained in the 30-40% range since 2015. Noncompliance r/t polysubstance abuse has been an issue in the past.  - Echo 11/24: EF 30-35% with prominent LV trabeculations. RV  mildly HK  - CMRI 1/25: LVEF 29% RV 45% -  Possible Noncompaction.  - NYHA II. Appear euvolemic. Has PRN Lasix  at home, has not needed.  - Continue Farxiga  10 mg daily  - Increase losartan  to 150 mg daily (no Entresto due to angiodema with ACEi). - Continue carvedilol  6.25 mg bid.  - Continue spironolactone  25 mg daily  - Will keep med regimen simple. Has significant history  of noncompliance but seems to be doing better recently. Verified his fill history.   - Not candidate for ICD previously with intermittent noncompliance with GDMT. Will plan repeat echo same day as follow-up.  2. HTN - BP above goal - Increase losartan  as above - May benefit from hydralazine /imdur  but compliance would pose a concern  3. Polysubstance abuse - Followed at [facility]. - Reports he has refrained from crack/cocaine  4. Current smoker - Has cut back, last smoked a cigarette a few days ago - Discussed cessation again today  5. OSA  - Noncompliant w/ CPAP, lost his. Needs to get back in with PCP.   Doing better with med compliance. He cannot be seen in PCP's office without an ID card. His driver's license has lapsed and has not been able to get to [facility] to obtain a new one.  Follow up 3 months with Dr. Rosalynd with same day echo  [facility], Latoy Labriola N, PA-C  1:23 PM

## 2023-11-10 NOTE — Telephone Encounter (Signed)
 Today is the third appt pt has missed, please advise

## 2023-11-10 NOTE — Patient Instructions (Addendum)
 Take Losartan  150 mg (one and one half pill) daily. Labs today - will call you if abnormal. Return to see Dr. Cherrie with echo in 3 months. CALL [phone] IN FEBRUARY TO SCHEDULE THIS APPOINTMENT.  Please call us  at [phone] if any questions or concerns prior to your next appointment.

## 2023-11-16 ENCOUNTER — Telehealth (HOSPITAL_COMMUNITY): Payer: Self-pay | Admitting: Licensed Clinical Social Worker

## 2023-11-16 NOTE — Telephone Encounter (Addendum)
 H&V Care Navigation CSW Progress Note  Clinical Social Worker received call from pt requesting assistance with overdue rent.  States that family member died recently and they all had to chip in on funeral expenses which lead him to be short for his rent.  Owes between $500-600 but states he has money order for $250 already to give to them.   Pt still receives food stamps so eligible for assistance through patient care fund- has balance of $335.66- pt reports he can manage the rest.    CSW spoke with management company and they sent required documents- check request submitted.  Will continue to follow and assist as needed  Andriette HILARIO Leech, LCSW Clinical Social Worker [facility] Desk#: [phone] Cell#: [phone]

## 2023-11-22 ENCOUNTER — Telehealth (HOSPITAL_COMMUNITY): Payer: Self-pay | Admitting: Licensed Clinical Social Worker

## 2023-11-22 NOTE — Telephone Encounter (Signed)
 H&V Care Navigation CSW Progress Note  Clinical Social Worker received check for rental assistance payment- mailed to [employer].  Pt informed of above.  Andriette HILARIO Leech, LCSW Clinical Social Worker [facility] Desk#: [phone] Cell#: [phone]

## 2023-12-11 ENCOUNTER — Ambulatory Visit (HOSPITAL_COMMUNITY)

## 2023-12-13 ENCOUNTER — Telehealth (HOSPITAL_COMMUNITY): Payer: Self-pay | Admitting: Family Medicine

## 2023-12-13 NOTE — Telephone Encounter (Signed)
 Called to confirm/remind patient of their appointment at the [facility] on 12/13/23.   Appointment:   [x] Confirmed  [] Left mess   [] No answer/No voice mail  [] VM Full/unable to leave message  [] Phone not in service  Patient reminded to bring all medications and/or complete list.  Confirmed patient has transportation. Gave directions, instructed to utilize valet parking.

## 2023-12-14 ENCOUNTER — Ambulatory Visit (HOSPITAL_COMMUNITY): Payer: Self-pay | Admitting: Physician Assistant

## 2023-12-14 ENCOUNTER — Ambulatory Visit (HOSPITAL_COMMUNITY): Admission: RE | Admit: 2023-12-14 | Discharge: 2023-12-14 | Attending: Internal Medicine

## 2023-12-14 DIAGNOSIS — I5022 Chronic systolic (congestive) heart failure: Secondary | ICD-10-CM

## 2023-12-14 LAB — BASIC METABOLIC PANEL WITH GFR
Anion gap: 11 (ref 5–15)
BUN: 11 mg/dL (ref 8–23)
CO2: 25 mmol/L (ref 22–32)
Calcium: 8.6 mg/dL — ABNORMAL LOW (ref 8.9–10.3)
Chloride: 104 mmol/L (ref 98–111)
Creatinine, Ser: 1.2 mg/dL (ref 0.61–1.24)
GFR, Estimated: >60 mL/min (ref >60)
Glucose, Bld: 74 mg/dL (ref 70–99)
Potassium: 4.3 mmol/L (ref 3.5–5.1)
Sodium: 140 mmol/L (ref 135–145)

## 2023-12-14 LAB — BRAIN NATRIURETIC PEPTIDE: B Natriuretic Peptide: 287 pg/mL — ABNORMAL HIGH (ref 0.0–100.0)

## 2024-02-23 ENCOUNTER — Ambulatory Visit (HOSPITAL_COMMUNITY): Admitting: Internal Medicine

## 2024-02-23 ENCOUNTER — Ambulatory Visit (HOSPITAL_COMMUNITY)
